# Patient Record
Sex: Male | Born: 1955
Health system: Southern US, Community
[De-identification: ages and names within clinical notes are randomized; demographics above are authoritative.]

## PROBLEM LIST (undated history)

## (undated) DIAGNOSIS — Z972 Presence of dental prosthetic device (complete) (partial): Secondary | ICD-10-CM

## (undated) DIAGNOSIS — Z973 Presence of spectacles and contact lenses: Secondary | ICD-10-CM

## (undated) DIAGNOSIS — B1920 Unspecified viral hepatitis C without hepatic coma: Secondary | ICD-10-CM

## (undated) DIAGNOSIS — H9193 Unspecified hearing loss, bilateral: Secondary | ICD-10-CM

## (undated) DIAGNOSIS — K219 Gastro-esophageal reflux disease without esophagitis: Secondary | ICD-10-CM

## (undated) DIAGNOSIS — N186 End stage renal disease: Secondary | ICD-10-CM

## (undated) DIAGNOSIS — G253 Myoclonus: Secondary | ICD-10-CM

## (undated) DIAGNOSIS — F32A Depression, unspecified: Secondary | ICD-10-CM

## (undated) DIAGNOSIS — M1990 Unspecified osteoarthritis, unspecified site: Secondary | ICD-10-CM

## (undated) DIAGNOSIS — F419 Anxiety disorder, unspecified: Secondary | ICD-10-CM

## (undated) DIAGNOSIS — J189 Pneumonia, unspecified organism: Secondary | ICD-10-CM

## (undated) DIAGNOSIS — Z862 Personal history of diseases of the blood and blood-forming organs and certain disorders involving the immune mechanism: Secondary | ICD-10-CM

## (undated) DIAGNOSIS — F329 Major depressive disorder, single episode, unspecified: Secondary | ICD-10-CM

## (undated) DIAGNOSIS — Z992 Dependence on renal dialysis: Secondary | ICD-10-CM

## (undated) DIAGNOSIS — D649 Anemia, unspecified: Secondary | ICD-10-CM

## (undated) DIAGNOSIS — I10 Essential (primary) hypertension: Secondary | ICD-10-CM

## (undated) DIAGNOSIS — N289 Disorder of kidney and ureter, unspecified: Secondary | ICD-10-CM

## (undated) DIAGNOSIS — I639 Cerebral infarction, unspecified: Secondary | ICD-10-CM

## (undated) DIAGNOSIS — R0600 Dyspnea, unspecified: Secondary | ICD-10-CM

## (undated) DIAGNOSIS — N189 Chronic kidney disease, unspecified: Secondary | ICD-10-CM

## (undated) DIAGNOSIS — N529 Male erectile dysfunction, unspecified: Secondary | ICD-10-CM

## (undated) HISTORY — PX: AV FISTULA PLACEMENT: SHX1204

## (undated) HISTORY — PX: COLON RESECTION: SHX5231

## (undated) HISTORY — PX: UMBILICAL HERNIA REPAIR: SHX196

## (undated) HISTORY — PX: TONSILLECTOMY: SUR1361

## (undated) HISTORY — PX: CATARACT EXTRACTION: SUR2

## (undated) HISTORY — PX: COLONOSCOPY W/ BIOPSIES AND POLYPECTOMY: SHX1376

## (undated) HISTORY — PX: HERNIA REPAIR: SHX51

---

## 2005-01-30 ENCOUNTER — Emergency Department (HOSPITAL_COMMUNITY): Admission: EM | Admit: 2005-01-30 | Discharge: 2005-01-30 | Payer: Self-pay | Admitting: Emergency Medicine

## 2005-05-25 ENCOUNTER — Emergency Department (HOSPITAL_COMMUNITY): Admission: EM | Admit: 2005-05-25 | Discharge: 2005-05-25 | Payer: Self-pay | Admitting: Emergency Medicine

## 2005-08-20 ENCOUNTER — Emergency Department (HOSPITAL_COMMUNITY): Admission: EM | Admit: 2005-08-20 | Discharge: 2005-08-20 | Payer: Self-pay | Admitting: Emergency Medicine

## 2005-10-14 ENCOUNTER — Emergency Department (HOSPITAL_COMMUNITY): Admission: EM | Admit: 2005-10-14 | Discharge: 2005-10-15 | Payer: Self-pay | Admitting: Emergency Medicine

## 2018-03-30 ENCOUNTER — Encounter (HOSPITAL_COMMUNITY): Payer: Self-pay | Admitting: Emergency Medicine

## 2018-03-30 ENCOUNTER — Observation Stay (HOSPITAL_COMMUNITY)
Admission: EM | Admit: 2018-03-30 | Discharge: 2018-04-02 | Disposition: A | Discharge status: Home / Self Care | Payer: Medicaid Other | Attending: Internal Medicine | Admitting: Internal Medicine

## 2018-03-30 DIAGNOSIS — E875 Hyperkalemia: Principal | ICD-10-CM | POA: Insufficient documentation

## 2018-03-30 DIAGNOSIS — I69349 Monoplegia of lower limb following cerebral infarction affecting unspecified side: Secondary | ICD-10-CM | POA: Insufficient documentation

## 2018-03-30 DIAGNOSIS — D631 Anemia in chronic kidney disease: Secondary | ICD-10-CM | POA: Diagnosis not present

## 2018-03-30 DIAGNOSIS — Z87891 Personal history of nicotine dependence: Secondary | ICD-10-CM | POA: Diagnosis not present

## 2018-03-30 DIAGNOSIS — Z79899 Other long term (current) drug therapy: Secondary | ICD-10-CM | POA: Insufficient documentation

## 2018-03-30 DIAGNOSIS — N2581 Secondary hyperparathyroidism of renal origin: Secondary | ICD-10-CM | POA: Diagnosis not present

## 2018-03-30 DIAGNOSIS — I10 Essential (primary) hypertension: Secondary | ICD-10-CM | POA: Diagnosis present

## 2018-03-30 DIAGNOSIS — Z992 Dependence on renal dialysis: Secondary | ICD-10-CM | POA: Diagnosis not present

## 2018-03-30 DIAGNOSIS — I120 Hypertensive chronic kidney disease with stage 5 chronic kidney disease or end stage renal disease: Secondary | ICD-10-CM | POA: Insufficient documentation

## 2018-03-30 DIAGNOSIS — N186 End stage renal disease: Secondary | ICD-10-CM

## 2018-03-30 HISTORY — DX: Cerebral infarction, unspecified: I63.9

## 2018-03-30 HISTORY — DX: Essential (primary) hypertension: I10

## 2018-03-30 HISTORY — DX: Disorder of kidney and ureter, unspecified: N28.9

## 2018-03-30 HISTORY — DX: Anxiety disorder, unspecified: F41.9

## 2018-03-30 LAB — CBC
HCT: 39.1 % (ref 39.0–52.0)
Hemoglobin: 12.4 g/dL — ABNORMAL LOW (ref 13.0–17.0)
MCH: 33 pg (ref 26.0–34.0)
MCHC: 31.7 g/dL (ref 30.0–36.0)
MCV: 104 fL — ABNORMAL HIGH (ref 78.0–100.0)
PLATELETS: 203 10*3/uL (ref 150–400)
RBC: 3.76 MIL/uL — AB (ref 4.22–5.81)
RDW: 14.9 % (ref 11.5–15.5)
WBC: 5.6 10*3/uL (ref 4.0–10.5)

## 2018-03-30 LAB — BASIC METABOLIC PANEL
ANION GAP: 21 — AB (ref 5–15)
BUN: 102 mg/dL — ABNORMAL HIGH (ref 6–20)
CALCIUM: 9.5 mg/dL (ref 8.9–10.3)
CO2: 21 mmol/L — ABNORMAL LOW (ref 22–32)
Chloride: 100 mmol/L — ABNORMAL LOW (ref 101–111)
Creatinine, Ser: 16.39 mg/dL — ABNORMAL HIGH (ref 0.61–1.24)
GFR, EST AFRICAN AMERICAN: 3 mL/min — AB (ref >60)
GFR, EST NON AFRICAN AMERICAN: 3 mL/min — AB (ref >60)
Glucose, Bld: 98 mg/dL (ref 65–99)
POTASSIUM: 6.2 mmol/L — AB (ref 3.5–5.1)
SODIUM: 142 mmol/L (ref 135–145)

## 2018-03-30 NOTE — ED Triage Notes (Signed)
Pt presents with complications from paperwork (or lack thereof) for setting up dialysis and prescriptions from being released from prison on Monday; pt received his last dialysis treatment on Monday before discharge; pt states the prison didn't do his paperwork right and he has been to several dialysis centers in the area which were not able to see him

## 2018-03-31 ENCOUNTER — Other Ambulatory Visit: Payer: Self-pay

## 2018-03-31 ENCOUNTER — Emergency Department (HOSPITAL_COMMUNITY): Payer: Medicaid Other

## 2018-03-31 ENCOUNTER — Encounter (HOSPITAL_COMMUNITY): Payer: Self-pay | Admitting: Internal Medicine

## 2018-03-31 DIAGNOSIS — Z992 Dependence on renal dialysis: Secondary | ICD-10-CM | POA: Diagnosis not present

## 2018-03-31 DIAGNOSIS — N186 End stage renal disease: Secondary | ICD-10-CM | POA: Diagnosis present

## 2018-03-31 DIAGNOSIS — I120 Hypertensive chronic kidney disease with stage 5 chronic kidney disease or end stage renal disease: Secondary | ICD-10-CM | POA: Diagnosis not present

## 2018-03-31 DIAGNOSIS — I10 Essential (primary) hypertension: Secondary | ICD-10-CM | POA: Diagnosis present

## 2018-03-31 DIAGNOSIS — E875 Hyperkalemia: Secondary | ICD-10-CM | POA: Diagnosis not present

## 2018-03-31 LAB — CBC
HCT: 34 % — ABNORMAL LOW (ref 39.0–52.0)
HEMOGLOBIN: 11 g/dL — AB (ref 13.0–17.0)
MCH: 34 pg (ref 26.0–34.0)
MCHC: 32.4 g/dL (ref 30.0–36.0)
MCV: 104.9 fL — ABNORMAL HIGH (ref 78.0–100.0)
PLATELETS: 163 10*3/uL (ref 150–400)
RBC: 3.24 MIL/uL — AB (ref 4.22–5.81)
RDW: 15 % (ref 11.5–15.5)
WBC: 5.4 10*3/uL (ref 4.0–10.5)

## 2018-03-31 LAB — HIV ANTIBODY (ROUTINE TESTING W REFLEX): HIV Screen 4th Generation wRfx: NONREACTIVE

## 2018-03-31 LAB — RENAL FUNCTION PANEL
ALBUMIN: 3.7 g/dL (ref 3.5–5.0)
ANION GAP: 20 — AB (ref 5–15)
BUN: 109 mg/dL — ABNORMAL HIGH (ref 6–20)
CALCIUM: 9.3 mg/dL (ref 8.9–10.3)
CO2: 19 mmol/L — ABNORMAL LOW (ref 22–32)
CREATININE: 17.43 mg/dL — AB (ref 0.61–1.24)
Chloride: 104 mmol/L (ref 101–111)
GFR calc non Af Amer: 3 mL/min — ABNORMAL LOW (ref >60)
GFR, EST AFRICAN AMERICAN: 3 mL/min — AB (ref >60)
Glucose, Bld: 89 mg/dL (ref 65–99)
Phosphorus: 8.4 mg/dL — ABNORMAL HIGH (ref 2.5–4.6)
Potassium: 5.7 mmol/L — ABNORMAL HIGH (ref 3.5–5.1)
SODIUM: 143 mmol/L (ref 135–145)

## 2018-03-31 MED ORDER — CALCIUM CARBONATE ANTACID 500 MG PO CHEW
2.0000 | CHEWABLE_TABLET | Freq: Three times a day (TID) | ORAL | Status: DC
  Administered 2018-03-31 – 2018-04-02 (×6): 400 mg via ORAL
  Filled 2018-03-31 (×7): qty 2

## 2018-03-31 MED ORDER — HYDRALAZINE HCL 25 MG PO TABS
25.0000 mg | ORAL_TABLET | Freq: Three times a day (TID) | ORAL | Status: DC
  Administered 2018-03-31 – 2018-04-01 (×4): 25 mg via ORAL
  Filled 2018-03-31 (×5): qty 1

## 2018-03-31 MED ORDER — PATIROMER SORBITEX CALCIUM 8.4 G PO PACK
16.8000 g | PACK | Freq: Every day | ORAL | Status: DC
  Administered 2018-03-31: 16.8 g via ORAL
  Filled 2018-03-31 (×3): qty 2

## 2018-03-31 MED ORDER — LOSARTAN POTASSIUM 50 MG PO TABS
50.0000 mg | ORAL_TABLET | Freq: Every day | ORAL | Status: DC
  Administered 2018-04-01: 50 mg via ORAL
  Filled 2018-03-31 (×2): qty 1

## 2018-03-31 MED ORDER — ONDANSETRON HCL 4 MG/2ML IJ SOLN: 4.0000 mg | Freq: Four times a day (QID) | INTRAMUSCULAR | Status: DC | PRN

## 2018-03-31 MED ORDER — IBUPROFEN 400 MG PO TABS: 400.0000 mg | ORAL_TABLET | Freq: Four times a day (QID) | ORAL | Status: DC | PRN

## 2018-03-31 MED ORDER — B COMPLEX-C PO TABS
1.0000 | ORAL_TABLET | Freq: Every day | ORAL | Status: DC
  Administered 2018-04-01: 1 via ORAL
  Filled 2018-03-31 (×2): qty 1

## 2018-03-31 MED ORDER — ACETAMINOPHEN 325 MG PO TABS: 650.0000 mg | ORAL_TABLET | Freq: Four times a day (QID) | ORAL | Status: DC | PRN

## 2018-03-31 MED ORDER — AMLODIPINE BESYLATE 10 MG PO TABS
10.0000 mg | ORAL_TABLET | Freq: Every day | ORAL | Status: DC
  Administered 2018-03-31 – 2018-04-01 (×2): 10 mg via ORAL
  Filled 2018-03-31 (×2): qty 1

## 2018-03-31 MED ORDER — FAMOTIDINE 20 MG PO TABS
40.0000 mg | ORAL_TABLET | Freq: Every day | ORAL | Status: DC
  Administered 2018-03-31: 40 mg via ORAL
  Filled 2018-03-31: qty 2

## 2018-03-31 MED ORDER — ACETAMINOPHEN 650 MG RE SUPP: 650.0000 mg | Freq: Four times a day (QID) | RECTAL | Status: DC | PRN

## 2018-03-31 MED ORDER — HEPARIN SODIUM (PORCINE) 5000 UNIT/ML IJ SOLN
5000.0000 [IU] | Freq: Three times a day (TID) | INTRAMUSCULAR | Status: DC
  Administered 2018-03-31 – 2018-04-02 (×6): 5000 [IU] via SUBCUTANEOUS
  Filled 2018-03-31 (×5): qty 1

## 2018-03-31 MED ORDER — METOPROLOL TARTRATE 50 MG PO TABS
150.0000 mg | ORAL_TABLET | Freq: Two times a day (BID) | ORAL | Status: DC
  Administered 2018-04-01 – 2018-04-02 (×3): 150 mg via ORAL
  Filled 2018-03-31 (×4): qty 1

## 2018-03-31 MED ORDER — CLONIDINE HCL 0.3 MG PO TABS
0.3000 mg | ORAL_TABLET | Freq: Three times a day (TID) | ORAL | Status: DC
  Administered 2018-03-31 – 2018-04-01 (×4): 0.3 mg via ORAL
  Filled 2018-03-31 (×5): qty 1

## 2018-03-31 MED ORDER — SODIUM CHLORIDE 0.9 % IV SOLN: 100.0000 mL | INTRAVENOUS | Status: DC | PRN

## 2018-03-31 MED ORDER — CHLORHEXIDINE GLUCONATE CLOTH 2 % EX PADS: 6.0000 | MEDICATED_PAD | Freq: Every day | CUTANEOUS | Status: DC

## 2018-03-31 MED ORDER — SEVELAMER CARBONATE 800 MG PO TABS
3200.0000 mg | ORAL_TABLET | Freq: Three times a day (TID) | ORAL | Status: DC
  Administered 2018-03-31 – 2018-04-02 (×5): 3200 mg via ORAL
  Filled 2018-03-31 (×10): qty 4

## 2018-03-31 MED ORDER — ONDANSETRON HCL 4 MG PO TABS: 4.0000 mg | ORAL_TABLET | Freq: Four times a day (QID) | ORAL | Status: DC | PRN

## 2018-03-31 MED ORDER — DIPHENHYDRAMINE HCL 25 MG PO CAPS: 50.0000 mg | ORAL_CAPSULE | Freq: Two times a day (BID) | ORAL | Status: DC | PRN

## 2018-03-31 MED ORDER — CLONIDINE HCL 0.2 MG PO TABS
0.3000 mg | ORAL_TABLET | Freq: Once | ORAL | Status: AC
  Administered 2018-03-31: 0.3 mg via ORAL
  Filled 2018-03-31: qty 1

## 2018-03-31 MED ORDER — CALCIUM POLYCARBOPHIL 625 MG PO TABS
1250.0000 mg | ORAL_TABLET | Freq: Two times a day (BID) | ORAL | Status: DC
  Administered 2018-03-31 – 2018-04-02 (×4): 1250 mg via ORAL
  Filled 2018-03-31 (×5): qty 2

## 2018-03-31 NOTE — ED Provider Notes (Signed)
[facility] EMERGENCY DEPARTMENT Provider Note   CSN: [id number] Arrival date & time: 03/30/18  2040     History   Chief Complaint Chief Complaint  Patient presents with  . Vascular Access Problem    HPI Maurice Bell is a 62 y.o. male.  Patient states he needs dialysis.  He has been on dialysis for the past 14 years and does not make urine.  He was released from prison 2 days ago after being incarcerated for many years.  His last dialysis session was on May 27.  He denies any chest pain or shortness of breath.  He denies any difficulty breathing or difficulty swallowing.  No focal weakness, numbness or tingling.  No headache or visual changes.  Patient states his paperwork was inadequate for him to have outpatient dialysis. He is also out of his clonidine but has his other medications.  The history is provided by the patient.    Past Medical History:  Diagnosis Date  . Hypertension   . Renal disorder    ESRD  . Stroke [facility])    bilat leg weakness residual    There are no active problems to display for this patient.   Past Surgical History:  Procedure Laterality Date  . AV FISTULA PLACEMENT Left         Home Medications    Prior to Admission medications   Not on File    Family History No family history on file.  Social History Social History   Tobacco Use  . Smoking status: Former Smoker  Substance Use Topics  . Alcohol use: Not Currently  . Drug use: Not Currently     Allergies   Patient has no known allergies.   Review of Systems Review of Systems  Constitutional: Negative for activity change, appetite change and fever.  HENT: Negative for congestion.   Eyes: Negative for visual disturbance.  Respiratory: Negative for cough, chest tightness and shortness of breath.   Cardiovascular: Negative for chest pain.  Gastrointestinal: Negative for abdominal pain, nausea and vomiting.  Genitourinary: Negative for dysuria,  hematuria and urgency.  Musculoskeletal: Negative for arthralgias.  Skin: Negative for rash.  Neurological: Negative for dizziness, tremors and headaches.    all other systems are negative except as noted in the HPI and PMH.    Physical Exam Updated Vital Signs BP (!) 192/99 (BP Location: Right Arm)   Pulse 94   Temp 98.7 F (37.1 C) (Oral)   Resp 16   Ht 5\' 5"  (1.651 m)   Wt 68 kg (150 lb)   SpO2 97%   BMI 24.96 kg/m   Physical Exam  Constitutional: He is oriented to person, place, and time. He appears well-developed and well-nourished. No distress.  HENT:  Head: Normocephalic and atraumatic.  Mouth/Throat: Oropharynx is clear and moist. No oropharyngeal exudate.  Eyes: Pupils are equal, round, and reactive to light. Conjunctivae and EOM are normal.  Neck: Normal range of motion. Neck supple.  No meningismus.  Cardiovascular: Normal rate, regular rhythm, normal heart sounds and intact distal pulses.  No murmur heard. Pulmonary/Chest: Effort normal and breath sounds normal. No respiratory distress.  Abdominal: Soft. There is no tenderness. There is no rebound and no guarding.  Musculoskeletal: Normal range of motion. He exhibits no edema or tenderness.  Fistula L forearm with thrill  Neurological: He is alert and oriented to person, place, and time. No cranial nerve deficit. He exhibits normal muscle tone. Coordination normal.  No ataxia on  finger to nose bilaterally. No pronator drift. 5/5 strength throughout. CN 2-12 intact.Equal grip strength. Sensation intact.   Skin: Skin is warm.  Psychiatric: He has a normal mood and affect. His behavior is normal.  Nursing note and vitals reviewed.    ED Treatments / Results  Labs (all labs ordered are listed, but only abnormal results are displayed) Labs Reviewed  CBC - Abnormal; Notable for the following components:      Result Value   RBC 3.76 (*)    Hemoglobin 12.4 (*)    MCV 104.0 (*)    All other components within  normal limits  BASIC METABOLIC PANEL - Abnormal; Notable for the following components:   Potassium 6.2 (*)    Chloride 100 (*)    CO2 21 (*)    BUN 102 (*)    Creatinine, Ser 16.39 (*)    GFR calc non Af Amer 3 (*)    GFR calc Af Amer 3 (*)    Anion gap 21 (*)    All other components within normal limits    EKG EKG Interpretation  Date/Time:  Wednesday Mar 30 2018 22:16:14 EDT Ventricular Rate:  83 PR Interval:  184 QRS Duration: 88 QT Interval:  388 QTC Calculation: 455 R Axis:   -64 Text Interpretation:  Normal sinus rhythm Left axis deviation Nonspecific T wave abnormality Abnormal ECG no peaked T waves  Confirmed by Ezequiel Essex [phone]) on 03/31/2018 12:05:55 AM   Radiology Dg Chest 2 View  Result Date: 03/31/2018 CLINICAL DATA:  62 y/o  M; shortness of breath. EXAM: CHEST - 2 VIEW COMPARISON:  10/15/2005 chest radiograph FINDINGS: Stable heart size and mediastinal contours are within normal limits. Nipple shadows over the lower lung zones. No focal consolidation. No pleural effusion or pneumothorax. The visualized skeletal structures are unremarkable. IMPRESSION: No acute pulmonary process identified. Electronically Signed   By: Kristine Garbe M.D.   On: 03/31/2018 01:37    Procedures Procedures (including critical care time)  Medications Ordered in ED Medications  cloNIDine (CATAPRES) tablet 0.3 mg (has no administration in time range)     Initial Impression / Assessment and Plan / ED Course  I have reviewed the triage vital signs and the nursing notes.  Pertinent labs & imaging results that were available during my care of the patient were reviewed by me and considered in my medical decision making (see chart for details).    Dialysis patient with potassium 6.2.  EKG without peaked T waves.  Patient released from prison 2 days ago and does not have outpatient dialysis established.  D/w Dr. Joelyn Oms of nephrology.  Without outpatient center, will  admit overnight for dialysis in the AM Recommends veltassa and admission to hospitalist.   D/w Dr. Alcario Drought. Clonidine given for likely rebound HTN.   CRITICAL CARE Performed by: Ezequiel Essex Total critical care time: 32 minutes Critical care time was exclusive of separately billable procedures and treating other patients. Critical care was necessary to treat or prevent imminent or life-threatening deterioration. Critical care was time spent personally by me on the following activities: development of treatment plan with patient and/or surrogate as well as nursing, discussions with consultants, evaluation of patient's response to treatment, examination of patient, obtaining history from patient or surrogate, ordering and performing treatments and interventions, ordering and review of laboratory studies, ordering and review of radiographic studies, pulse oximetry and re-evaluation of patient's condition.  Final Clinical Impressions(s) / ED Diagnoses   Final diagnoses:  Hyperkalemia  ESRD (end stage renal disease) [facility])    ED Discharge Orders    None       Alyss Granato, Annie Main, MD 03/31/18 [phone]

## 2018-03-31 NOTE — Procedures (Signed)
Patient was seen on dialysis and the procedure was supervised.  BFR 400  Via AVF BP is  143/86.   Patient appears to be tolerating treatment well.  No OP HD arrangements made when got discharged from prison.  Have initiated CLIP- HD today, will probably need to stay inpatient until OP arrangements solidified   Matthieu Loftus A 03/31/2018

## 2018-03-31 NOTE — ED Notes (Signed)
Attempted to gain IV access x2, without success.  

## 2018-03-31 NOTE — ED Notes (Signed)
Patient transported to X-ray 

## 2018-03-31 NOTE — Progress Notes (Signed)
Admission note:  Arrival Method: Patient arrived in w/c from ED. Mental Orientation:  A & O  x4. Telemetry: Tele Box 18, NSR. Assessment: See doc flow sheets. Skin: No open wounds or any rash noted. Warm, dry and intact. IV: Right hand IV SL. Pain: Denies any pain. Tubes: None. Safety Measures: Bed in low position, call bell and phone within reach. Fall Prevention Safety Plan: Reviewed the plan, understood and acknowledged. Admission Screening: In progress. 6700 Orientation: Patient has been oriented to the unit, staff and to the room.

## 2018-03-31 NOTE — ED Notes (Signed)
IV Team at bedside 

## 2018-03-31 NOTE — ED Notes (Signed)
Diet tray ordered 

## 2018-03-31 NOTE — Progress Notes (Signed)
Patient released from [facility] [place] 03/28/18 without oupatient HD treatment arranged.  Spoke with Dr. Lorrene Reid, she requested to initiate the CLIP process to facilitate placement.  Last outpt facility was [facility] approx 12 years ago (prior to re-incarceration.).  He states there were no problems with trmt at that facility.  S Richardson notified to initiated CLIP process.

## 2018-03-31 NOTE — Consult Note (Signed)
Maurice Bell Admit Date: 03/30/2018 03/31/2018 Maurice Bell Requesting Physician:  Rancour MD  Reason for Consult:  ESRD, Hyperkalemia, HTN HPI:  64M who was released from prison on Monday.  He has ESRD and it appears that no concrete arrangements were made for him to start treatment.  He states he reached out to local kidney centers and dialysis providers without any success and was told to present to the emergency room.  He has no chest pain, headache, shortness of breath, nausea, vomiting.  In the emergency room he has been hypertensive.  He states that he received all of his antihypertensive medications upon being released with the exception of 0.3 mg p.o. 3 times daily of clonidine which he has not received.  Labs in the emergency room notable for potassium of 6.2, BUN 102, hemoglobin 12.4.  Chest x-ray with no acute process.  EKG with normal PR interval, normal QRS interval, no peak T waves.  Given Patiromer in ED  Has hx/o CVA, no hx/o DM2.   Balance of 12 systems is negative w/ exceptions as above  PMH  Past Medical History:  Diagnosis Date  . Hypertension   . Renal disorder    ESRD  . Stroke [facility])    bilat leg weakness residual   FH No family history on file. SH  reports that he has quit smoking. He does not have any smokeless tobacco history on file. He reports that he drank alcohol. He reports that he has current or past drug history. Allergies No Known Allergies Home medications Prior to Admission medications   Medication Sig Start Date End Date Taking? Authorizing Provider  amLODipine (NORVASC) 10 MG tablet Take 10 mg by mouth daily.   Yes [provider]  B Complex-C (B-COMPLEX WITH VITAMIN C) tablet Take 1 tablet by mouth daily.   Yes [provider]  calcium carbonate (TUMS - DOSED IN MG ELEMENTAL CALCIUM) 500 MG chewable tablet Chew 2 tablets by mouth 3 (three) times daily with meals.   Yes [provider]  cloNIDine (CATAPRES)  0.3 MG tablet Take 0.3 mg by mouth 3 (three) times daily.   Yes [provider]  diphenhydrAMINE (BENADRYL) 50 MG capsule Take 50 mg by mouth every 12 (twelve) hours as needed for itching.   Yes [provider]  hydrALAZINE (APRESOLINE) 25 MG tablet Take 25 mg by mouth 3 (three) times daily.   Yes [provider]  ibuprofen (ADVIL,MOTRIN) 200 MG tablet Take 400 mg by mouth every 6 (six) hours as needed for mild pain.   Yes [provider]  losartan (COZAAR) 50 MG tablet Take 50 mg by mouth at bedtime.   Yes [provider]  metoprolol tartrate (LOPRESSOR) 50 MG tablet Take 150 mg by mouth 2 (two) times daily.   Yes [provider]  polycarbophil (FIBERCON) 625 MG tablet Take 1,250 mg by mouth 2 (two) times daily.   Yes [provider]  ranitidine (ZANTAC) 300 MG tablet Take 300 mg by mouth at bedtime.   Yes [provider]  sevelamer carbonate (RENVELA) 800 MG tablet Take 3,200 mg by mouth 3 (three) times daily with meals.   Yes [provider]    Current Medications Scheduled Meds: . patiromer  16.8 g Oral Daily   Continuous Infusions: PRN Meds:.  CBC Recent Labs  Lab 03/30/18 2222  WBC 5.6  HGB 12.4*  HCT 39.1  MCV 104.0*  PLT 330   Basic Metabolic Panel Recent Labs  Lab 03/30/18 2222  NA 142  K 6.2*  CL 100*  CO2 21*  GLUCOSE 98  BUN 102*  CREATININE 16.39*  CALCIUM 9.5    Physical Exam  Blood pressure (!) 182/108, pulse 93, temperature 98.7 F (37.1 C), temperature source Oral, resp. rate 15, height 5\' 5"  (1.651 m), weight 68 kg (150 lb), SpO2 100 %. GEN: NAD ENT: NCAT EYES: EOMI CV: RRR, nl s1s2 PULM: CTAB nl wob ABD: s/nt/nd SKIN: no rashes/lesiosn EXT:No LEE, LFA AVF +B/T   Assessment 69M ESRD LUE AVF without HD unit after release from prison.  1. ESRD 2. Hyperkalemia, mild 3. HTN, acute and severe, likely rebound from not rec clonidine 4. Hx/o CVA  Plan 1. HD in AM:  2K, 2-3L UF, AVF, No heparin, 4h 2. Clip pt 3. Resume all outpt BP meds, follow   Pearson Grippe MD [phone] pgr 03/31/2018, 2:24 AM

## 2018-03-31 NOTE — ED Notes (Signed)
Bed Control notified of pt's transfer to Hemodialysis and standing bed order.

## 2018-03-31 NOTE — Progress Notes (Signed)
Patient is admitted early this am, he is seen after returned from dialysis. He does not have any new complaints, will need case manager and social worker input to set up care /HD and meds needs. Hopefully able to d/c home tomorrow if outpatient services are able to set up.

## 2018-03-31 NOTE — H&P (Signed)
History and Physical    Maurice Bell WPY:[id number] DOB: 19-Jan-1956 DOA: 03/30/2018  PCP: Patient, No Pcp Per  Patient coming from: Home  I have personally briefly reviewed patient's old medical records in [place]  Chief Complaint: Needs dialysis  HPI: Maurice Bell is a 62 y.o. male with medical history significant of ESRD for past 14 years.  Patient was just released from prison on Monday after being incarcerated for an extended period of time.  It appears that no concrete arrangements were made for him to start treatment, reached out to local kidney centers without success and was told to go to ED.  Missed dialysis Wed.  ED Course: In the ED he has been hypertensive.  Got all HTN meds on release except clonidine 0.3mg  TID that he was taking.   Review of Systems: As per HPI otherwise 10 point review of systems negative.   Past Medical History:  Diagnosis Date  . Hypertension   . Renal disorder    ESRD  . Stroke [facility])    bilat leg weakness residual    Past Surgical History:  Procedure Laterality Date  . AV FISTULA PLACEMENT Left      reports that he has quit smoking. He does not have any smokeless tobacco history on file. He reports that he drank alcohol. He reports that he has current or past drug history.  No Known Allergies  History reviewed. No pertinent family history.   Prior to Admission medications   Medication Sig Start Date End Date Taking? Authorizing Provider  amLODipine (NORVASC) 10 MG tablet Take 10 mg by mouth daily.   Yes [provider]  B Complex-C (B-COMPLEX WITH VITAMIN C) tablet Take 1 tablet by mouth daily.   Yes [provider]  calcium carbonate (TUMS - DOSED IN MG ELEMENTAL CALCIUM) 500 MG chewable tablet Chew 2 tablets by mouth 3 (three) times daily with meals.   Yes [provider]  cloNIDine (CATAPRES) 0.3 MG tablet Take 0.3 mg by mouth 3 (three) times daily.   Yes [provider]    diphenhydrAMINE (BENADRYL) 50 MG capsule Take 50 mg by mouth every 12 (twelve) hours as needed for itching.   Yes [provider]  hydrALAZINE (APRESOLINE) 25 MG tablet Take 25 mg by mouth 3 (three) times daily.   Yes [provider]  ibuprofen (ADVIL,MOTRIN) 200 MG tablet Take 400 mg by mouth every 6 (six) hours as needed for mild pain.   Yes [provider]  losartan (COZAAR) 50 MG tablet Take 50 mg by mouth at bedtime.   Yes [provider]  metoprolol tartrate (LOPRESSOR) 50 MG tablet Take 150 mg by mouth 2 (two) times daily.   Yes [provider]  polycarbophil (FIBERCON) 625 MG tablet Take 1,250 mg by mouth 2 (two) times daily.   Yes [provider]  ranitidine (ZANTAC) 300 MG tablet Take 300 mg by mouth at bedtime.   Yes [provider]  sevelamer carbonate (RENVELA) 800 MG tablet Take 3,200 mg by mouth 3 (three) times daily with meals.   Yes [provider]    Physical Exam: Vitals:   03/31/18 0230 03/31/18 0300 03/31/18 0330 03/31/18 0400  BP: (!) 180/102 (!) 177/101 (!) 159/107   Pulse: 95 93  89  Resp: 14 20 (!) 23 16  Temp:      TempSrc:      SpO2: 99% 100%  97%  Weight:      Height:  Constitutional: NAD, calm, comfortable Eyes: PERRL, lids and conjunctivae normal ENMT: Mucous membranes are moist. Posterior pharynx clear of any exudate or lesions.Normal dentition.  Neck: normal, supple, no masses, no thyromegaly Respiratory: clear to auscultation bilaterally, no wheezing, no crackles. Normal respiratory effort. No accessory muscle use.  Cardiovascular: Regular rate and rhythm, no murmurs / rubs / gallops. No extremity edema. 2+ pedal pulses. No carotid bruits.  Abdomen: no tenderness, no masses palpated. No hepatosplenomegaly. Bowel sounds positive.  Musculoskeletal: no clubbing / cyanosis. No joint deformity upper and lower extremities. Good ROM, no contractures. Normal muscle tone.  Skin: no  rashes, lesions, ulcers. No induration Neurologic: CN 2-12 grossly intact. Sensation intact, DTR normal. Strength 5/5 in all 4.  Psychiatric: Normal judgment and insight. Alert and oriented x 3. Normal mood.    Labs on Admission: I have personally reviewed following labs and imaging studies  CBC: Recent Labs  Lab 03/30/18 2222  WBC 5.6  HGB 12.4*  HCT 39.1  MCV 104.0*  PLT 656   Basic Metabolic Panel: Recent Labs  Lab 03/30/18 2222  NA 142  K 6.2*  CL 100*  CO2 21*  GLUCOSE 98  BUN 102*  CREATININE 16.39*  CALCIUM 9.5   GFR: Estimated Creatinine Clearance: 4.2 mL/min (A) (by C-G formula based on SCr of 16.39 mg/dL (H)). Liver Function Tests: No results for input(s): AST, ALT, ALKPHOS, BILITOT, PROT, ALBUMIN in the last 168 hours. No results for input(s): LIPASE, AMYLASE in the last 168 hours. No results for input(s): AMMONIA in the last 168 hours. Coagulation Profile: No results for input(s): INR, PROTIME in the last 168 hours. Cardiac Enzymes: No results for input(s): CKTOTAL, CKMB, CKMBINDEX, TROPONINI in the last 168 hours. BNP (last 3 results) No results for input(s): PROBNP in the last 8760 hours. HbA1C: No results for input(s): HGBA1C in the last 72 hours. CBG: No results for input(s): GLUCAP in the last 168 hours. Lipid Profile: No results for input(s): CHOL, HDL, LDLCALC, TRIG, CHOLHDL, LDLDIRECT in the last 72 hours. Thyroid Function Tests: No results for input(s): TSH, T4TOTAL, FREET4, T3FREE, THYROIDAB in the last 72 hours. Anemia Panel: No results for input(s): VITAMINB12, FOLATE, FERRITIN, TIBC, IRON, RETICCTPCT in the last 72 hours. Urine analysis: No results found for: COLORURINE, APPEARANCEUR, LABSPEC, PHURINE, GLUCOSEU, HGBUR, BILIRUBINUR, KETONESUR, PROTEINUR, UROBILINOGEN, NITRITE, LEUKOCYTESUR  Radiological Exams on Admission: Dg Chest 2 View  Result Date: 03/31/2018 CLINICAL DATA:  62 y/o  M; shortness of breath. EXAM: CHEST - 2 VIEW  COMPARISON:  10/15/2005 chest radiograph FINDINGS: Stable heart size and mediastinal contours are within normal limits. Nipple shadows over the lower lung zones. No focal consolidation. No pleural effusion or pneumothorax. The visualized skeletal structures are unremarkable. IMPRESSION: No acute pulmonary process identified. Electronically Signed   By: Kristine Garbe M.D.   On: 03/31/2018 01:37    EKG: Independently reviewed.  Assessment/Plan Principal Problem:   Need for acute hemodialysis ([place]) Active Problems:   ESRD (end stage renal disease) ([facility])   HTN (hypertension)    1. ESRD, needs dialysis - 1. Place in obs 2. Dialysis in AM 3. Dr. Joelyn Oms has seen patient 2. Hyperkalemia - 1. Tele monitor 2. Veltassa per Dr. Adin Hector instructions 3. HTN - 1. Continue home BP meds 2. Got clonadine in ED for severe HTN, probably rebound since he didn't get this on release from prison 3. CM to make sure he can get this as outpt  DVT prophylaxis: SCDs Code Status: Full Family Communication: No  family in room Disposition Plan: Home after admit Consults called: None Admission status: Place in obs   Bertha Lokken, [facility] Pager [phone]  If 7AM-7PM, please contact day team taking care of patient [email or web address] Password [email or web address]  03/31/2018, 5:02 AM

## 2018-03-31 NOTE — ED Notes (Signed)
Patient returned from Dialysis , no complaints at present.

## 2018-04-01 DIAGNOSIS — I120 Hypertensive chronic kidney disease with stage 5 chronic kidney disease or end stage renal disease: Secondary | ICD-10-CM | POA: Diagnosis not present

## 2018-04-01 DIAGNOSIS — N186 End stage renal disease: Secondary | ICD-10-CM | POA: Diagnosis not present

## 2018-04-01 DIAGNOSIS — Z992 Dependence on renal dialysis: Secondary | ICD-10-CM | POA: Diagnosis not present

## 2018-04-01 DIAGNOSIS — E875 Hyperkalemia: Secondary | ICD-10-CM | POA: Diagnosis not present

## 2018-04-01 LAB — BASIC METABOLIC PANEL
ANION GAP: 16 — AB (ref 5–15)
BUN: 40 mg/dL — ABNORMAL HIGH (ref 6–20)
CHLORIDE: 95 mmol/L — AB (ref 101–111)
CO2: 25 mmol/L (ref 22–32)
Calcium: 8.9 mg/dL (ref 8.9–10.3)
Creatinine, Ser: 10.24 mg/dL — ABNORMAL HIGH (ref 0.61–1.24)
GFR calc non Af Amer: 5 mL/min — ABNORMAL LOW (ref >60)
GFR, EST AFRICAN AMERICAN: 6 mL/min — AB (ref >60)
Glucose, Bld: 95 mg/dL (ref 65–99)
Potassium: 3.9 mmol/L (ref 3.5–5.1)
Sodium: 136 mmol/L (ref 135–145)

## 2018-04-01 LAB — HEPATITIS B SURFACE ANTIGEN: Hepatitis B Surface Ag: NEGATIVE

## 2018-04-01 MED ORDER — HYDRALAZINE HCL 25 MG PO TABS: 25.0000 mg | ORAL_TABLET | Freq: Three times a day (TID) | ORAL | 1 refills | Status: DC

## 2018-04-01 MED ORDER — LOSARTAN POTASSIUM 50 MG PO TABS: 50.0000 mg | ORAL_TABLET | Freq: Every day | ORAL | 1 refills | Status: DC

## 2018-04-01 MED ORDER — CALCIUM CARBONATE ANTACID 500 MG PO CHEW: 2.0000 | CHEWABLE_TABLET | Freq: Three times a day (TID) | ORAL | 0 refills | Status: DC

## 2018-04-01 MED ORDER — AMLODIPINE BESYLATE 10 MG PO TABS: 10.0000 mg | ORAL_TABLET | Freq: Every day | ORAL | 1 refills | Status: DC

## 2018-04-01 MED ORDER — DIPHENHYDRAMINE HCL 50 MG PO CAPS: 50.0000 mg | ORAL_CAPSULE | Freq: Two times a day (BID) | ORAL | 0 refills | Status: DC | PRN

## 2018-04-01 MED ORDER — CLONIDINE HCL 0.3 MG PO TABS: 0.3000 mg | ORAL_TABLET | Freq: Three times a day (TID) | ORAL | 1 refills | Status: DC

## 2018-04-01 MED ORDER — FAMOTIDINE 20 MG PO TABS
20.0000 mg | ORAL_TABLET | Freq: Every day | ORAL | Status: DC
  Administered 2018-04-01: 20 mg via ORAL
  Filled 2018-04-01: qty 1

## 2018-04-01 MED ORDER — SEVELAMER CARBONATE 800 MG PO TABS: 3200.0000 mg | ORAL_TABLET | Freq: Three times a day (TID) | ORAL | 1 refills | Status: DC

## 2018-04-01 MED ORDER — CALCIUM POLYCARBOPHIL 625 MG PO TABS: 1250.0000 mg | ORAL_TABLET | Freq: Two times a day (BID) | ORAL | 0 refills | Status: DC

## 2018-04-01 MED ORDER — RANITIDINE HCL 300 MG PO TABS: 300.0000 mg | ORAL_TABLET | Freq: Every day | ORAL | 0 refills | Status: DC

## 2018-04-01 MED ORDER — METOPROLOL TARTRATE 50 MG PO TABS: 150.0000 mg | ORAL_TABLET | Freq: Two times a day (BID) | ORAL | 1 refills | Status: DC

## 2018-04-01 MED ORDER — CHLORHEXIDINE GLUCONATE CLOTH 2 % EX PADS: 6.0000 | MEDICATED_PAD | Freq: Every day | CUTANEOUS | Status: DC

## 2018-04-01 MED ORDER — B COMPLEX-C PO TABS: 1.0000 | ORAL_TABLET | Freq: Every day | ORAL | 0 refills | Status: DC

## 2018-04-01 MED FILL — METOPROLOL TARTRATE 50 MG T: 50 | 10 days supply | Qty: 60 | Fill #0

## 2018-04-01 MED FILL — LOSARTAN POTASSIUM 50 MG TA: 50 | 30 days supply | Qty: 30 | Fill #0

## 2018-04-01 MED FILL — AMLODIPINE BESYLATE 10 MG T: 10 | 30 days supply | Qty: 30 | Fill #0

## 2018-04-01 MED FILL — cloNIDine HCL 0.3 MG TABS: 0.3 | 30 days supply | Qty: 90 | Fill #0

## 2018-04-01 MED FILL — hydrALAZINE HCL 25 MG TABS: 25 | 30 days supply | Qty: 90 | Fill #0

## 2018-04-01 MED FILL — raNITIdine HCL 300 MG TABS: 300 | 30 days supply | Qty: 30 | Fill #0

## 2018-04-01 MED FILL — RENVELA 800 MG TABLET: 800 | 22 days supply | Qty: 270 | Fill #0

## 2018-04-01 NOTE — Progress Notes (Signed)
Subjective:  Doing well- no issues with HD yesterday - removed almost 2000- post weight 64.6.  Anxious to go home- working out financials to OP HD but pretty sure he will go to [place]  Objective Vital signs in last 24 hours: Vitals:   03/31/18 1528 03/31/18 2101 04/01/18 0503 04/01/18 0830  BP: (!) 141/117 99/68 (!) 149/81 (!) 143/82  Pulse: 93 81 87 81  Resp: (!) 22 16 16 16   Temp: 98.2 F (36.8 C) 98.8 F (37.1 C) 98.8 F (37.1 C) 98.1 F (36.7 C)  TempSrc: Oral Oral Oral Oral  SpO2: 100% 100% 100% 100%  Weight:  64.7 kg (142 lb 10.2 oz)    Height:       Weight change: -1.84 kg (-4 lb 0.9 oz)  Intake/Output Summary (Last 24 hours) at 04/01/2018 1413 Last data filed at 04/01/2018 1030 Gross per 24 hour  Intake 660 ml  Output 0 ml  Net 660 ml    Assessment/ Plan: Pt is a 62 y.o. yo male with ESRD who was admitted on 03/30/2018 with no arrangements made for OP HD when released from prison  Assessment/Plan: 1. ESRD-  HD yest via AVF after no HD for several days.  Will likely be accepted to [place] but waiting on some financial info.  I think safe to plan to have him get HD here in the AM then discharge.  Our staff will be on top on it to let him know where to be and when next week at which time I anticipate placement will be final 2. Anemia- not bad, hgb over 11- no ESA here- will cont to follow as OP 4. Secondary hyperparathyroidism- phos 8.4- on tums as binder- will check PTH and handle at OP unit 5. HTN/volume- EDW seems to be around 64.5-  6. Hyperkalemia- resolved with hd   Maurice Bell A    Labs: Basic Metabolic Panel: Recent Labs  Lab 03/30/18 2222 03/31/18 0802 04/01/18 0622  NA 142 143 136  K 6.2* 5.7* 3.9  CL 100* 104 95*  CO2 21* 19* 25  GLUCOSE 98 89 95  BUN 102* 109* 40*  CREATININE 16.39* 17.43* 10.24*  CALCIUM 9.5 9.3 8.9  PHOS  --  8.4*  --    Liver Function Tests: Recent Labs  Lab 03/31/18 0802  ALBUMIN 3.7   No results for input(s):  LIPASE, AMYLASE in the last 168 hours. No results for input(s): AMMONIA in the last 168 hours. CBC: Recent Labs  Lab 03/30/18 2222 03/31/18 0802  WBC 5.6 5.4  HGB 12.4* 11.0*  HCT 39.1 34.0*  MCV 104.0* 104.9*  PLT 203 163   Cardiac Enzymes: No results for input(s): CKTOTAL, CKMB, CKMBINDEX, TROPONINI in the last 168 hours. CBG: No results for input(s): GLUCAP in the last 168 hours.  Iron Studies: No results for input(s): IRON, TIBC, TRANSFERRIN, FERRITIN in the last 72 hours. Studies/Results: Dg Chest 2 View  Result Date: 03/31/2018 CLINICAL DATA:  62 y/o  M; shortness of breath. EXAM: CHEST - 2 VIEW COMPARISON:  10/15/2005 chest radiograph FINDINGS: Stable heart size and mediastinal contours are within normal limits. Nipple shadows over the lower lung zones. No focal consolidation. No pleural effusion or pneumothorax. The visualized skeletal structures are unremarkable. IMPRESSION: No acute pulmonary process identified. Electronically Signed   By: Kristine Garbe M.D.   On: 03/31/2018 01:37   Medications: Infusions:   Scheduled Medications: . amLODipine  10 mg Oral Daily  . B-complex with vitamin C  1  tablet Oral Daily  . calcium carbonate  2 tablet Oral TID WC  . Chlorhexidine Gluconate Cloth  6 each Topical Q0600  . cloNIDine  0.3 mg Oral TID  . famotidine  40 mg Oral QHS  . heparin  5,000 Units Subcutaneous Q8H  . hydrALAZINE  25 mg Oral TID  . losartan  50 mg Oral QHS  . metoprolol tartrate  150 mg Oral BID  . patiromer  16.8 g Oral Daily  . polycarbophil  1,250 mg Oral BID  . sevelamer carbonate  3,200 mg Oral TID WC    have reviewed scheduled and prn medications.  Physical Exam: General: NAD Heart: RRR Lungs: clear Abdomen: soft, non tender Extremities: no edema Dialysis Access: left AVF    04/01/2018,2:13 PM  LOS: 0 days

## 2018-04-01 NOTE — Progress Notes (Signed)
PROGRESS NOTE  Maurice Bell VOZ:[id number] DOB: 02/26/1956 DOA: 03/30/2018 PCP: Patient, No Pcp Per  HPI/Recap of past 24 hours:  No new complaints,  Family at bedside  Assessment/Plan: Principal Problem:   Need for acute hemodialysis ([place]) Active Problems:   ESRD (end stage renal disease) ([place])   HTN (hypertension)  Hyperkalemia -Due to missing HD -K6.2 on presentation, k normalized today at 3.9 -Tele unremarkable, d/c tele  HTN Currently controlled  ESRD on HD HD via AVF MWF In the process setting up outpatient HD ( he was just released from the prison, he has not set up care in the community yet)  Anemia on chronic disease hgb 11, stable  Code Status: full  Family Communication: patient and family at bedside  Disposition Plan: home tomorrow after HD   Consultants:  Nephrology  Case manager  Social worker  Procedures:  HD  Antibiotics:  none   Objective: BP (!) 143/82 (BP Location: Right Arm)   Pulse 81   Temp 98.1 F (36.7 C) (Oral)   Resp 16   Ht 5\' 5"  (1.651 m)   Wt 64.7 kg (142 lb 10.2 oz)   SpO2 100%   BMI 23.74 kg/m   Intake/Output Summary (Last 24 hours) at 04/01/2018 1436 Last data filed at 04/01/2018 1030 Gross per 24 hour  Intake 660 ml  Output 0 ml  Net 660 ml   Filed Weights   03/31/18 0730 03/31/18 1215 03/31/18 2101  Weight: 66.2 kg (145 lb 15.1 oz) 64.6 kg (142 lb 6.7 oz) 64.7 kg (142 lb 10.2 oz)    Exam: Patient is examined daily including today on 04/01/2018, exams remain the same as of yesterday except that has changed    General:  NAD  Cardiovascular: RRR  Respiratory: CTABL  Abdomen: Soft/ND/NT, positive BS  Musculoskeletal: No Edema  Neuro: alert, oriented   Data Reviewed: Basic Metabolic Panel: Recent Labs  Lab 03/30/18 2222 03/31/18 0802 04/01/18 0622  NA 142 143 136  K 6.2* 5.7* 3.9  CL 100* 104 95*  CO2 21* 19* 25  GLUCOSE 98 89 95  BUN 102* 109* 40*  CREATININE 16.39* 17.43*  10.24*  CALCIUM 9.5 9.3 8.9  PHOS  --  8.4*  --    Liver Function Tests: Recent Labs  Lab 03/31/18 0802  ALBUMIN 3.7   No results for input(s): LIPASE, AMYLASE in the last 168 hours. No results for input(s): AMMONIA in the last 168 hours. CBC: Recent Labs  Lab 03/30/18 2222 03/31/18 0802  WBC 5.6 5.4  HGB 12.4* 11.0*  HCT 39.1 34.0*  MCV 104.0* 104.9*  PLT 203 163   Cardiac Enzymes:   No results for input(s): CKTOTAL, CKMB, CKMBINDEX, TROPONINI in the last 168 hours. BNP (last 3 results) No results for input(s): BNP in the last 8760 hours.  ProBNP (last 3 results) No results for input(s): PROBNP in the last 8760 hours.  CBG: No results for input(s): GLUCAP in the last 168 hours.  No results found for this or any previous visit (from the past 240 hour(s)).   Studies: No results found.  Scheduled Meds: . amLODipine  10 mg Oral Daily  . B-complex with vitamin C  1 tablet Oral Daily  . calcium carbonate  2 tablet Oral TID WC  . Chlorhexidine Gluconate Cloth  6 each Topical Q0600  . [START ON 04/02/2018] Chlorhexidine Gluconate Cloth  6 each Topical Q0600  . cloNIDine  0.3 mg Oral TID  . famotidine  40  mg Oral QHS  . heparin  5,000 Units Subcutaneous Q8H  . hydrALAZINE  25 mg Oral TID  . losartan  50 mg Oral QHS  . metoprolol tartrate  150 mg Oral BID  . patiromer  16.8 g Oral Daily  . polycarbophil  1,250 mg Oral BID  . sevelamer carbonate  3,200 mg Oral TID WC      Time spent: 66mins , more than 50% spent on coordination of care,  Case discussed with case manager , Education officer, museum, nephrology Need to set up care in the community /meds needs/transportation/insurance   I have personally reviewed and interpreted on  04/01/2018 daily labs, tele strips, imagings as discussed above under date review session and assessment and plans.  I reviewed all nursing notes, pharmacy notes, consultant notes,  vitals, pertinent old records  I have discussed plan of care as  described above with RN , patient and family on 04/01/2018   Florencia Reasons MD, PhD  [facility] Pager [phone]. If 7PM-7AM, please contact night-coverage at [email or web address], password [facility] 04/01/2018, 2:36 PM  LOS: 0 days

## 2018-04-01 NOTE — Progress Notes (Addendum)
Case Management Note  Patient Details  Name: Maurice Bell MRN: [id number] Date of Birth: 23-Sep-1956  Subjective/Objective: History of HTN, ESRD on HD. Admitted for Need for acute hemodialysis.  No OP Hemodialysis arrangements made when got discharged from prison.  No PCP noted.  Action/Plan: NCM spoke with financial counselor "Vivien Rota" regarding Medicare qualification process. Per Vivien Rota patient is on financial counselor Work Que to be followed up, she notify Janett Billow that patient is ESRD will be requiring dialysis; they assess Medicaid potential first.   No PCP noted, NCM made new patient appointment for patient at [facility]" for a Tuesday see AVS.   Patient Uninsured. NCM called [facility] and [place] inquired If clinical carries Medication "Renvela" in stock?  Per [facility] "Illene Regulus is a pricey drug and they prefer physicians uses an alternative if possible.  NCM will notify attending of information provided re: Renvela. NCM call pharmacist at [facility] to obtain 340B pricing for Renvela 3200 mg tid, states he will call me back.  Per Gerald Stabs with [facility] pharmacy can provide 270 tablets for $10 from 340B account or a month supply ordered would be around $500.  Would not have prescription ready until Monday April 04, 2018.  NCM advised [facility] we will send refill for Renvela for 270 tablets for the agreed cost of 10$ and patient will have to initiate the medication assistance program from [place].  NCM spoke with Pharmacist Gerald Stabs and provided him with patient information to be on the look out for Renvela Prescription.  NCM spoke with Attending Dr. Erlinda Hong advised to e-scribe Renvela to [facility] for 3 weeks worth to be paid by [facility] and patient will need the patient assistance program that can be started at [place]; e-scribe the rest of patient medications to [facility] and  patient does qualify for One Time Free Fill.   NCM in to speak with patient, Patient given 30 day supply of the following medications prior to discharge: Ranitidine, clonidine, amlodipine, hydralazine, losartan metoprolol.  NCM advised there is a prescription Renvela pending at the outpatient pharmacy that we need to obtain and pay $10 copay for patient.   Expected Discharge Date:  04/01/2018                Expected Discharge Plan:  Home/Self Care  In-House Referral:  Clinical Social Work: transportation  Discharge planning Services  CM Consult, [facility]  Status of Service:  In process, will continue to follow  Montel Culver, RN Nurse Case Manager

## 2018-04-02 DIAGNOSIS — E875 Hyperkalemia: Secondary | ICD-10-CM | POA: Diagnosis not present

## 2018-04-02 DIAGNOSIS — N186 End stage renal disease: Secondary | ICD-10-CM | POA: Diagnosis not present

## 2018-04-02 DIAGNOSIS — I120 Hypertensive chronic kidney disease with stage 5 chronic kidney disease or end stage renal disease: Secondary | ICD-10-CM | POA: Diagnosis not present

## 2018-04-02 DIAGNOSIS — Z992 Dependence on renal dialysis: Secondary | ICD-10-CM | POA: Diagnosis not present

## 2018-04-02 MED ORDER — HYDRALAZINE HCL 25 MG PO TABS: 25.0000 mg | ORAL_TABLET | Freq: Two times a day (BID) | ORAL | 0 refills | Status: DC

## 2018-04-02 MED ORDER — CLONIDINE HCL 0.1 MG PO TABS
0.1000 mg | ORAL_TABLET | Freq: Two times a day (BID) | ORAL | Status: DC
  Administered 2018-04-02: 0.1 mg via ORAL
  Filled 2018-04-02: qty 1

## 2018-04-02 MED ORDER — AMLODIPINE BESYLATE 10 MG PO TABS: 10.0000 mg | ORAL_TABLET | Freq: Every day | ORAL | Status: DC

## 2018-04-02 MED ORDER — CLONIDINE HCL 0.1 MG PO TABS: 0.1000 mg | ORAL_TABLET | Freq: Two times a day (BID) | ORAL | 0 refills | Status: DC

## 2018-04-02 MED ORDER — RENA-VITE PO TABS: 1.0000 | ORAL_TABLET | Freq: Every day | ORAL | Status: DC

## 2018-04-02 MED ORDER — AMLODIPINE BESYLATE 10 MG PO TABS: 10.0000 mg | ORAL_TABLET | Freq: Every day | ORAL | 0 refills | Status: DC

## 2018-04-02 MED ORDER — HYDRALAZINE HCL 25 MG PO TABS
25.0000 mg | ORAL_TABLET | Freq: Two times a day (BID) | ORAL | Status: DC
  Administered 2018-04-02: 25 mg via ORAL
  Filled 2018-04-02: qty 1

## 2018-04-02 NOTE — Procedures (Signed)
I was present at this session.  I have reviewed the session itself and made appropriate changes.  HD via LLA AVF , unfortunately both needle up so recirc.  Educate. Lower vol so lower bp meds. Maurice Bell 6/1/20198:01 AM

## 2018-04-02 NOTE — Plan of Care (Signed)
  Problem: Education: Goal: Knowledge of General Education information will improve Outcome: Progressing Note:  POC reviewed with pt.   

## 2018-04-02 NOTE — Discharge Summary (Signed)
Discharge Summary  Maurice Bell GLO:[id number] DOB: [birth year]  PCP: Patient, No Pcp Per  Admit date: 03/30/2018 Discharge date: 04/02/2018  Time spent: <23mins  Recommendations for Outpatient Follow-up:  1. F/u with PMD within a week  for hospital discharge follow up, repeat cbc/bmp at follow up. 2. F/u with nephrology  Discharge Diagnoses:  Active Hospital Problems   Diagnosis Date Noted  . Need for acute hemodialysis ([place]) 03/31/2018  . ESRD (end stage renal disease) ([place]) 03/31/2018  . HTN (hypertension) 03/31/2018    Resolved Hospital Problems  No resolved problems to display.    Discharge Condition: stable  Diet recommendation: dialysis diet  Filed Weights   04/01/18 2219 04/02/18 0732 04/02/18 1127  Weight: 64.3 kg (141 lb 11.2 oz) 64.5 kg (142 lb 3.2 oz) 62.3 kg (137 lb 5.6 oz)    History of present illness: (admitting MD Dr Alcario Drought) Patient coming from: Home  I have personally briefly reviewed patient's old medical records in [place]  Chief Complaint: Needs dialysis  HPI: Maurice Bell is a 62 y.o. male with medical history significant of ESRD for past 14 years.  Patient was just released from prison on Monday after being incarcerated for an extended period of time.  It appears that no concrete arrangements were made for him to start treatment, reached out to local kidney centers without success and was told to go to ED.  Missed dialysis Wed.  ED Course: In the ED he has been hypertensive.  Got all HTN meds on release except clonidine 0.3mg  TID that he was taking.    Hospital Course:  Principal Problem:   Need for acute hemodialysis ([place]) Active Problems:   ESRD (end stage renal disease) ([place])   HTN (hypertension)   Hyperkalemia (on presentation) -Due to missing HD -K6.2 on presentation, k normalized  at 3.9   HTN Currently controlled on meds listed below.  ESRD on HD HD via AVF MWF outpatient HD set up, he is to start  HD in [place] on TTS.  Anemia on chronic disease hgb 11, stable  Code Status: full  Family Communication: patient and family at bedside  Disposition Plan: home    Consultants:  Nephrology  Case manager  Social worker  Procedures:  HD  Antibiotics:  none   Discharge Exam: BP (!) 121/101   Pulse 88   Temp 97.9 F (36.6 C) (Oral)   Resp (!) 21   Ht 5\' 5"  (1.651 m)   Wt 62.3 kg (137 lb 5.6 oz)   SpO2 100%   BMI 22.86 kg/m   General: NAD Cardiovascular: RRR Respiratory: CTABL  Discharge Instructions You were cared for by a hospitalist during your hospital stay. If you have any questions about your discharge medications or the care you received while you were in the hospital after you are discharged, you can call the unit and asked to speak with the hospitalist on call if the hospitalist that took care of you is not available. Once you are discharged, your primary care physician will handle any further medical issues. Please note that NO REFILLS for any discharge medications will be authorized once you are discharged, as it is imperative that you return to your primary care physician (or establish a relationship with a primary care physician if you do not have one) for your aftercare needs so that they can reassess your need for medications and monitor your lab values.  Discharge Instructions    Diet general   Complete by:  As  directed    Dialysis diet   Increase activity slowly   Complete by:  As directed      Allergies as of 04/02/2018   No Known Allergies     Medication List    STOP taking these medications   ibuprofen 200 MG tablet Commonly known as:  ADVIL,MOTRIN     TAKE these medications   amLODipine 10 MG tablet Commonly known as:  NORVASC Take 1 tablet (10 mg total) by mouth at bedtime. What changed:  when to take this   B-complex with vitamin C tablet Take 1 tablet by mouth daily.   calcium carbonate 500 MG chewable  tablet Commonly known as:  TUMS - dosed in mg elemental calcium Chew 2 tablets (400 mg of elemental calcium total) by mouth 3 (three) times daily with meals.   cloNIDine 0.1 MG tablet Commonly known as:  CATAPRES Take 1 tablet (0.1 mg total) by mouth 2 (two) times daily. What changed:    medication strength  how much to take  when to take this   diphenhydrAMINE 50 MG capsule Commonly known as:  BENADRYL Take 1 capsule (50 mg total) by mouth every 12 (twelve) hours as needed for itching.   hydrALAZINE 25 MG tablet Commonly known as:  APRESOLINE Take 1 tablet (25 mg total) by mouth 2 (two) times daily. What changed:  when to take this   losartan 50 MG tablet Commonly known as:  COZAAR Take 1 tablet (50 mg total) by mouth at bedtime.   metoprolol tartrate 50 MG tablet Commonly known as:  LOPRESSOR Take 3 tablets (150 mg total) by mouth 2 (two) times daily.   polycarbophil 625 MG tablet Commonly known as:  FIBERCON Take 2 tablets (1,250 mg total) by mouth 2 (two) times daily.   ranitidine 300 MG tablet Commonly known as:  ZANTAC Take 1 tablet (300 mg total) by mouth at bedtime.   sevelamer carbonate 800 MG tablet Commonly known as:  RENVELA Take 4 tablets (3,200 mg total) by mouth 3 (three) times daily with meals.      No Known Allergies Follow-up Information     [facility] Follow up on 05/03/2018.   Why:  arrive at 8:30 am, bring photo ID and current medications Contact information: [place] [id number] [facility] Follow up.   Why:  Pick-up Renvela prescription on Monday 04/03/2018 Contact information: [address],  [place], [place] [address]       [facility] Follow up.   Why:  Go to pharmacy to seek assistance with Patient Assistance Program for the medication Renevela. Contact information:  [address]   [place], [place] [address]  [phone]           The results of significant diagnostics from this hospitalization (including imaging, microbiology, ancillary and laboratory) are listed below for reference.    Significant Diagnostic Studies: Dg Chest 2 View  Result Date: 03/31/2018 CLINICAL DATA:  62 y/o  M; shortness of breath. EXAM: CHEST - 2 VIEW COMPARISON:  10/15/2005 chest radiograph FINDINGS: Stable heart size and mediastinal contours are within normal limits. Nipple shadows over the lower lung zones. No focal consolidation. No pleural effusion or pneumothorax. The visualized skeletal structures are unremarkable. IMPRESSION: No acute pulmonary process identified. Electronically Signed   By: Kristine Garbe M.D.   On: 03/31/2018 01:37    Microbiology: No results found  for this or any previous visit (from the past 240 hour(s)).   Labs: Basic Metabolic Panel: Recent Labs  Lab 03/30/18 2222 03/31/18 0802 04/01/18 0622  NA 142 143 136  K 6.2* 5.7* 3.9  CL 100* 104 95*  CO2 21* 19* 25  GLUCOSE 98 89 95  BUN 102* 109* 40*  CREATININE 16.39* 17.43* 10.24*  CALCIUM 9.5 9.3 8.9  PHOS  --  8.4*  --    Liver Function Tests: Recent Labs  Lab 03/31/18 0802  ALBUMIN 3.7   No results for input(s): LIPASE, AMYLASE in the last 168 hours. No results for input(s): AMMONIA in the last 168 hours. CBC: Recent Labs  Lab 03/30/18 2222 03/31/18 0802  WBC 5.6 5.4  HGB 12.4* 11.0*  HCT 39.1 34.0*  MCV 104.0* 104.9*  PLT 203 163   Cardiac Enzymes: No results for input(s): CKTOTAL, CKMB, CKMBINDEX, TROPONINI in the last 168 hours. BNP: BNP (last 3 results) No results for input(s): BNP in the last 8760 hours.  ProBNP (last 3 results) No results for input(s): PROBNP in the last 8760 hours.  CBG: No results for input(s): GLUCAP in the last 168 hours.     Signed:  Florencia Reasons MD, PhD  [facility] 04/02/2018, 3:32 PM

## 2018-04-02 NOTE — Progress Notes (Signed)
Patient Discharge: Disposition: Patient discharged to home. Education: Reviewed medications, prescriptions, follow-up appointments and discharge instructions, verbalized understanding. IV: Discontinued IV before discharge. Telemetry: Discontinued Tele before discharge. Transportation: Patient refused w/c and walked out of the unit accompanied by the family members. Belongings: Patient took all his belongings with him.

## 2018-04-02 NOTE — Progress Notes (Signed)
Subjective: Interval History: has complaints wants to get out ASAP.  Objective: Vital signs in last 24 hours: Temp:  [97.8 F (36.6 C)-98.3 F (36.8 C)] 97.8 F (36.6 C) (06/01 0549) Pulse Rate:  [75-81] 75 (06/01 0549) Resp:  [14-18] 14 (06/01 0549) BP: (124-143)/(76-86) 126/76 (06/01 0549) SpO2:  [100 %] 100 % (06/01 0549) Weight:  [64.3 kg (141 lb 11.2 oz)] 64.3 kg (141 lb 11.2 oz) (05/31 2219) Weight change: -1.925 kg (-4 lb 3.9 oz)  Intake/Output from previous day: 05/31 0701 - 06/01 0700 In: 300 [P.O.:300] Out: 0  Intake/Output this shift: No intake/output data recorded.  General appearance: alert and no distress Resp: diminished breath sounds bilaterally Cardio: S1, S2 normal and systolic murmur: systolic ejection 2/6, decrescendo at 2nd left intercostal space GI: liver down 4 cm Extremities: AVF LLA  Lab Results: Recent Labs    03/30/18 2222 03/31/18 0802  WBC 5.6 5.4  HGB 12.4* 11.0*  HCT 39.1 34.0*  PLT 203 163   BMET:  Recent Labs    03/31/18 0802 04/01/18 0622  NA 143 136  K 5.7* 3.9  CL 104 95*  CO2 19* 25  GLUCOSE 89 95  BUN 109* 40*  CREATININE 17.43* 10.24*  CALCIUM 9.3 8.9   No results for input(s): PTH in the last 72 hours. Iron Studies: No results for input(s): IRON, TIBC, TRANSFERRIN, FERRITIN in the last 72 hours.  Studies/Results: No results found.  I have reviewed the patient's current medications.  Assessment/Plan: 1 ESRD  For HD. Arranging outpatient 2 Anemia ok 3 HPTH vit D 4 HTN lower vol, lower meds 5 Incarceration/social P HD, lower bp meds, vol, CLIP    LOS: 0 days   Maurice Bell 04/02/2018,7:59 AM

## 2018-04-04 MED FILL — hydrALAZINE HCL 25 MG TABS: 25 | 30 days supply | Qty: 60 | Fill #0

## 2018-04-04 MED FILL — cloNIDine HCL 0.1 MG TABS: 0.1 | 30 days supply | Qty: 60 | Fill #0

## 2018-04-05 DIAGNOSIS — D509 Iron deficiency anemia, unspecified: Secondary | ICD-10-CM | POA: Diagnosis not present

## 2018-04-05 DIAGNOSIS — D631 Anemia in chronic kidney disease: Secondary | ICD-10-CM | POA: Diagnosis not present

## 2018-04-05 DIAGNOSIS — N2581 Secondary hyperparathyroidism of renal origin: Secondary | ICD-10-CM | POA: Diagnosis not present

## 2018-04-05 DIAGNOSIS — N186 End stage renal disease: Secondary | ICD-10-CM | POA: Diagnosis not present

## 2018-04-07 DIAGNOSIS — N186 End stage renal disease: Secondary | ICD-10-CM | POA: Diagnosis not present

## 2018-04-07 DIAGNOSIS — D631 Anemia in chronic kidney disease: Secondary | ICD-10-CM | POA: Diagnosis not present

## 2018-04-07 DIAGNOSIS — N2581 Secondary hyperparathyroidism of renal origin: Secondary | ICD-10-CM | POA: Diagnosis not present

## 2018-04-07 DIAGNOSIS — D509 Iron deficiency anemia, unspecified: Secondary | ICD-10-CM | POA: Diagnosis not present

## 2018-04-09 DIAGNOSIS — D631 Anemia in chronic kidney disease: Secondary | ICD-10-CM | POA: Diagnosis not present

## 2018-04-09 DIAGNOSIS — N2581 Secondary hyperparathyroidism of renal origin: Secondary | ICD-10-CM | POA: Diagnosis not present

## 2018-04-09 DIAGNOSIS — N186 End stage renal disease: Secondary | ICD-10-CM | POA: Diagnosis not present

## 2018-04-09 DIAGNOSIS — D509 Iron deficiency anemia, unspecified: Secondary | ICD-10-CM | POA: Diagnosis not present

## 2018-04-12 DIAGNOSIS — N2581 Secondary hyperparathyroidism of renal origin: Secondary | ICD-10-CM | POA: Diagnosis not present

## 2018-04-12 DIAGNOSIS — D509 Iron deficiency anemia, unspecified: Secondary | ICD-10-CM | POA: Diagnosis not present

## 2018-04-12 DIAGNOSIS — N186 End stage renal disease: Secondary | ICD-10-CM | POA: Diagnosis not present

## 2018-04-12 DIAGNOSIS — D631 Anemia in chronic kidney disease: Secondary | ICD-10-CM | POA: Diagnosis not present

## 2018-04-14 DIAGNOSIS — N186 End stage renal disease: Secondary | ICD-10-CM | POA: Diagnosis not present

## 2018-04-14 DIAGNOSIS — D631 Anemia in chronic kidney disease: Secondary | ICD-10-CM | POA: Diagnosis not present

## 2018-04-14 DIAGNOSIS — D509 Iron deficiency anemia, unspecified: Secondary | ICD-10-CM | POA: Diagnosis not present

## 2018-04-14 DIAGNOSIS — N2581 Secondary hyperparathyroidism of renal origin: Secondary | ICD-10-CM | POA: Diagnosis not present

## 2018-04-16 DIAGNOSIS — N186 End stage renal disease: Secondary | ICD-10-CM | POA: Diagnosis not present

## 2018-04-16 DIAGNOSIS — D631 Anemia in chronic kidney disease: Secondary | ICD-10-CM | POA: Diagnosis not present

## 2018-04-16 DIAGNOSIS — N2581 Secondary hyperparathyroidism of renal origin: Secondary | ICD-10-CM | POA: Diagnosis not present

## 2018-04-16 DIAGNOSIS — D509 Iron deficiency anemia, unspecified: Secondary | ICD-10-CM | POA: Diagnosis not present

## 2018-04-19 ENCOUNTER — Emergency Department (HOSPITAL_COMMUNITY): Payer: Medicaid Other

## 2018-04-19 ENCOUNTER — Emergency Department (HOSPITAL_COMMUNITY)
Admission: EM | Admit: 2018-04-19 | Discharge: 2018-04-19 | Disposition: A | Discharge status: Home / Self Care | Payer: Medicaid Other | Attending: Emergency Medicine | Admitting: Emergency Medicine

## 2018-04-19 ENCOUNTER — Encounter (HOSPITAL_COMMUNITY): Payer: Self-pay | Admitting: Emergency Medicine

## 2018-04-19 DIAGNOSIS — N186 End stage renal disease: Secondary | ICD-10-CM | POA: Insufficient documentation

## 2018-04-19 DIAGNOSIS — Z87891 Personal history of nicotine dependence: Secondary | ICD-10-CM | POA: Diagnosis not present

## 2018-04-19 DIAGNOSIS — I120 Hypertensive chronic kidney disease with stage 5 chronic kidney disease or end stage renal disease: Secondary | ICD-10-CM | POA: Diagnosis not present

## 2018-04-19 DIAGNOSIS — Z79899 Other long term (current) drug therapy: Secondary | ICD-10-CM | POA: Insufficient documentation

## 2018-04-19 DIAGNOSIS — D509 Iron deficiency anemia, unspecified: Secondary | ICD-10-CM | POA: Diagnosis not present

## 2018-04-19 DIAGNOSIS — M79675 Pain in left toe(s): Secondary | ICD-10-CM | POA: Diagnosis not present

## 2018-04-19 DIAGNOSIS — D631 Anemia in chronic kidney disease: Secondary | ICD-10-CM | POA: Diagnosis not present

## 2018-04-19 DIAGNOSIS — Z992 Dependence on renal dialysis: Secondary | ICD-10-CM | POA: Insufficient documentation

## 2018-04-19 DIAGNOSIS — N2581 Secondary hyperparathyroidism of renal origin: Secondary | ICD-10-CM | POA: Diagnosis not present
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
## 2018-04-19 NOTE — ED Notes (Signed)
Pt alert and oriented in NAD. Pt verbalized understanding of discharge instructions. 

## 2018-04-19 NOTE — Discharge Instructions (Addendum)
Please read attached information. If you experience any new or worsening signs or symptoms please return to the emergency room for evaluation. Please follow-up with your primary care provider or specialist as discussed.  °

## 2018-04-21 DIAGNOSIS — N2581 Secondary hyperparathyroidism of renal origin: Secondary | ICD-10-CM | POA: Diagnosis not present

## 2018-04-21 DIAGNOSIS — N186 End stage renal disease: Secondary | ICD-10-CM | POA: Diagnosis not present

## 2018-04-21 DIAGNOSIS — D509 Iron deficiency anemia, unspecified: Secondary | ICD-10-CM | POA: Diagnosis not present

## 2018-04-21 DIAGNOSIS — D631 Anemia in chronic kidney disease: Secondary | ICD-10-CM | POA: Diagnosis not present

## 2018-04-23 DIAGNOSIS — D509 Iron deficiency anemia, unspecified: Secondary | ICD-10-CM | POA: Diagnosis not present

## 2018-04-23 DIAGNOSIS — N186 End stage renal disease: Secondary | ICD-10-CM | POA: Diagnosis not present

## 2018-04-23 DIAGNOSIS — D631 Anemia in chronic kidney disease: Secondary | ICD-10-CM | POA: Diagnosis not present

## 2018-04-23 DIAGNOSIS — N2581 Secondary hyperparathyroidism of renal origin: Secondary | ICD-10-CM | POA: Diagnosis not present

## 2018-04-26 DIAGNOSIS — D631 Anemia in chronic kidney disease: Secondary | ICD-10-CM | POA: Diagnosis not present

## 2018-04-26 DIAGNOSIS — N2581 Secondary hyperparathyroidism of renal origin: Secondary | ICD-10-CM | POA: Diagnosis not present

## 2018-04-26 DIAGNOSIS — D509 Iron deficiency anemia, unspecified: Secondary | ICD-10-CM | POA: Diagnosis not present

## 2018-04-26 DIAGNOSIS — N186 End stage renal disease: Secondary | ICD-10-CM | POA: Diagnosis not present

## 2018-04-28 DIAGNOSIS — N2581 Secondary hyperparathyroidism of renal origin: Secondary | ICD-10-CM | POA: Diagnosis not present

## 2018-04-28 DIAGNOSIS — D509 Iron deficiency anemia, unspecified: Secondary | ICD-10-CM | POA: Diagnosis not present

## 2018-04-28 DIAGNOSIS — N186 End stage renal disease: Secondary | ICD-10-CM | POA: Diagnosis not present

## 2018-04-28 DIAGNOSIS — D631 Anemia in chronic kidney disease: Secondary | ICD-10-CM | POA: Diagnosis not present

## 2018-04-30 DIAGNOSIS — D509 Iron deficiency anemia, unspecified: Secondary | ICD-10-CM | POA: Diagnosis not present

## 2018-04-30 DIAGNOSIS — N186 End stage renal disease: Secondary | ICD-10-CM | POA: Diagnosis not present

## 2018-04-30 DIAGNOSIS — N2581 Secondary hyperparathyroidism of renal origin: Secondary | ICD-10-CM | POA: Diagnosis not present

## 2018-04-30 DIAGNOSIS — D631 Anemia in chronic kidney disease: Secondary | ICD-10-CM | POA: Diagnosis not present

## 2018-05-01 DIAGNOSIS — N186 End stage renal disease: Secondary | ICD-10-CM | POA: Diagnosis not present

## 2018-05-01 DIAGNOSIS — Z992 Dependence on renal dialysis: Secondary | ICD-10-CM | POA: Diagnosis not present

## 2018-05-01 DIAGNOSIS — I129 Hypertensive chronic kidney disease with stage 1 through stage 4 chronic kidney disease, or unspecified chronic kidney disease: Secondary | ICD-10-CM | POA: Diagnosis not present

## 2018-05-03 ENCOUNTER — Encounter (INDEPENDENT_AMBULATORY_CARE_PROVIDER_SITE_OTHER): Payer: Self-pay | Admitting: Physician Assistant

## 2018-05-03 ENCOUNTER — Ambulatory Visit (INDEPENDENT_AMBULATORY_CARE_PROVIDER_SITE_OTHER): Payer: Self-pay | Admitting: Physician Assistant

## 2018-05-03 VITALS — BP 100/59 | HR 80 | Temp 98.6°F | Resp 18 | Ht 64.0 in | Wt 148.0 lb

## 2018-05-03 DIAGNOSIS — Z760 Encounter for issue of repeat prescription: Secondary | ICD-10-CM

## 2018-05-03 DIAGNOSIS — B182 Chronic viral hepatitis C: Secondary | ICD-10-CM

## 2018-05-03 DIAGNOSIS — D509 Iron deficiency anemia, unspecified: Secondary | ICD-10-CM | POA: Diagnosis not present

## 2018-05-03 DIAGNOSIS — Z114 Encounter for screening for human immunodeficiency virus [HIV]: Secondary | ICD-10-CM | POA: Diagnosis not present

## 2018-05-03 DIAGNOSIS — N186 End stage renal disease: Secondary | ICD-10-CM | POA: Diagnosis not present

## 2018-05-03 DIAGNOSIS — G8929 Other chronic pain: Secondary | ICD-10-CM

## 2018-05-03 DIAGNOSIS — N2581 Secondary hyperparathyroidism of renal origin: Secondary | ICD-10-CM | POA: Diagnosis not present

## 2018-05-03 DIAGNOSIS — D631 Anemia in chronic kidney disease: Secondary | ICD-10-CM | POA: Diagnosis not present

## 2018-05-03 DIAGNOSIS — M5441 Lumbago with sciatica, right side: Secondary | ICD-10-CM

## 2018-05-03 DIAGNOSIS — B009 Herpesviral infection, unspecified: Secondary | ICD-10-CM

## 2018-05-03 DIAGNOSIS — M5442 Lumbago with sciatica, left side: Secondary | ICD-10-CM

## 2018-05-03 DIAGNOSIS — Z79899 Other long term (current) drug therapy: Secondary | ICD-10-CM

## 2018-05-03 DIAGNOSIS — Z992 Dependence on renal dialysis: Secondary | ICD-10-CM

## 2018-05-03 MED ORDER — RANITIDINE HCL 300 MG PO TABS: 300.0000 mg | ORAL_TABLET | Freq: Every day | ORAL | 5 refills | Status: DC

## 2018-05-03 MED ORDER — SEVELAMER CARBONATE 800 MG PO TABS: 3200.0000 mg | ORAL_TABLET | Freq: Three times a day (TID) | ORAL | 5 refills | Status: DC

## 2018-05-03 MED ORDER — AMLODIPINE BESYLATE 10 MG PO TABS: 10.0000 mg | ORAL_TABLET | Freq: Every day | ORAL | 5 refills | Status: DC

## 2018-05-03 MED ORDER — ACETAMINOPHEN 500 MG PO TABS: 500.0000 mg | ORAL_TABLET | Freq: Three times a day (TID) | ORAL | 2 refills | Status: DC | PRN

## 2018-05-03 MED ORDER — METOPROLOL TARTRATE 50 MG PO TABS: 150.0000 mg | ORAL_TABLET | Freq: Two times a day (BID) | ORAL | 5 refills | Status: DC

## 2018-05-03 MED ORDER — CLONIDINE HCL 0.1 MG PO TABS: 0.1000 mg | ORAL_TABLET | Freq: Two times a day (BID) | ORAL | 5 refills | Status: DC

## 2018-05-03 MED ORDER — LOSARTAN POTASSIUM 50 MG PO TABS: 50.0000 mg | ORAL_TABLET | Freq: Every day | ORAL | 5 refills | Status: DC

## 2018-05-03 MED ORDER — CALCIUM CARBONATE ANTACID 500 MG PO CHEW: 2.0000 | CHEWABLE_TABLET | Freq: Three times a day (TID) | ORAL | 5 refills | Status: DC

## 2018-05-03 MED ORDER — B COMPLEX-C PO TABS: 1.0000 | ORAL_TABLET | Freq: Every day | ORAL | 5 refills | Status: DC

## 2018-05-03 MED ORDER — VALACYCLOVIR HCL 500 MG PO TABS: 500.0000 mg | ORAL_TABLET | Freq: Every day | ORAL | 11 refills | Status: DC

## 2018-05-03 MED FILL — SEVELAMER CARBONATE 800 MG: 800 | 7 days supply | Qty: 90 | Fill #0

## 2018-05-03 MED FILL — VALACYCLOVIR HCL 500 MG TAB: 500 | 30 days supply | Qty: 30 | Fill #0

## 2018-05-03 MED FILL — LOSARTAN POTASSIUM 50 MG TA: 50 | 30 days supply | Qty: 30 | Fill #0

## 2018-05-03 MED FILL — METOPROLOL TARTRATE 50 MG T: 50 | 10 days supply | Qty: 60 | Fill #0

## 2018-05-03 MED FILL — cloNIDine HCL 0.1 MG TABS: 0.1 | 30 days supply | Qty: 60 | Fill #0

## 2018-05-03 MED FILL — AMLODIPINE BESYLATE 10 MG T: 10 | 30 days supply | Qty: 30 | Fill #0

## 2018-05-03 MED FILL — raNITIdine HCL 300 MG TABS: 300 | 30 days supply | Qty: 30 | Fill #0

## 2018-05-03 NOTE — Patient Instructions (Signed)
Sciatica Sciatica is pain, numbness, weakness, or tingling along your sciatic nerve. The sciatic nerve starts in the lower back and goes down the back of each leg. Sciatica happens when this nerve is pinched or has pressure put on it. Sciatica usually goes away on its own or with treatment. Sometimes, sciatica may keep coming back (recur). Follow these instructions at home: Medicines  Take over-the-counter and prescription medicines only as told by your doctor.  Do not drive or use heavy machinery while taking prescription pain medicine. Managing pain  If directed, put ice on the affected area. ? Put ice in a plastic bag. ? Place a towel between your skin and the bag. ? Leave the ice on for 20 minutes, 2-3 times a day.  After icing, apply heat to the affected area before you exercise or as often as told by your doctor. Use the heat source that your doctor tells you to use, such as a moist heat pack or a heating pad. ? Place a towel between your skin and the heat source. ? Leave the heat on for 20-30 minutes. ? Remove the heat if your skin turns bright red. This is especially important if you are unable to feel pain, heat, or cold. You may have a greater risk of getting burned. Activity  Return to your normal activities as told by your doctor. Ask your doctor what activities are safe for you. ? Avoid activities that make your sciatica worse.  Take short rests during the day. Rest in a lying or standing position. This is usually better than sitting to rest. ? When you rest for a long time, do some physical activity or stretching between periods of rest. ? Avoid sitting for a long time without moving. Get up and move around at least one time each hour.  Exercise and stretch regularly, as told by your doctor.  Do not lift anything that is heavier than 10 lb (4.5 kg) while you have symptoms of sciatica. ? Avoid lifting heavy things even when you do not have symptoms. ? Avoid lifting heavy  things over and over.  When you lift objects, always lift in a way that is safe for your body. To do this, you should: ? Bend your knees. ? Keep the object close to your body. ? Avoid twisting. General instructions  Use good posture. ? Avoid leaning forward when you are sitting. ? Avoid hunching over when you are standing.  Stay at a healthy weight.  Wear comfortable shoes that support your feet. Avoid wearing high heels.  Avoid sleeping on a mattress that is too soft or too hard. You might have less pain if you sleep on a mattress that is firm enough to support your back.  Keep all follow-up visits as told by your doctor. This is important. Contact a doctor if:  You have pain that: ? Wakes you up when you are sleeping. ? Gets worse when you lie down. ? Is worse than the pain you have had in the past. ? Lasts longer than 4 weeks.  You lose weight for without trying. Get help right away if:  You cannot control when you pee (urinate) or poop (have a bowel movement).  You have weakness in any of these areas and it gets worse. ? Lower back. ? Lower belly (pelvis). ? Butt (buttocks). ? Legs.  You have redness or swelling of your back.  You have a burning feeling when you pee. This information is not intended to replace   advice given to you by your health care provider. Make sure you discuss any questions you have with your health care provider. Document Released: 07/28/2008 Document Revised: 03/26/2016 Document Reviewed: 06/28/2015 Elsevier Interactive Patient Education  2018 [employer]  

## 2018-05-03 NOTE — Progress Notes (Signed)
Subjective:  Patient ID: Maurice Bell, male    DOB: May 13, 1956  Age: 62 y.o. MRN: [id number]  CC: back pain  HPI Maurice Bell is a 62 y.o. male with a medical history of anxiety, hepatitis C, HSV2, HTN, ESRD, and stroke presents as a new patient with complaint of low back pain. Onset of low back pain six months ago. Attributed to arthritis. Had xrays approximately 4 months ago and was told he had "arthritis pushing down on disc and disc is pushing down on nerve". Had originally taken muscle relaxants with no relief. Has not taken anything for pain since then. Does not endorse saddle paresthesia, urinary incontinence, fecal incontinence, weakness, paralysis, CP, palpitations, SOB, HA, abdominal pain, f/c/n/v, or GI/GU sxs.    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 Also would like treatment for HCV. Has reportedly never previously received treatment for HCV.       Outpatient Medications Prior to Visit  Medication Sig Dispense Refill  . amLODipine (NORVASC) 10 MG tablet Take 1 tablet (10 mg total) by mouth at bedtime. 30 tablet 0  . B Complex-C (B-COMPLEX WITH VITAMIN C) tablet Take 1 tablet by mouth daily. 30 tablet 0  . calcium carbonate (TUMS - DOSED IN MG ELEMENTAL CALCIUM) 500 MG chewable tablet Chew 2 tablets (400 mg of elemental calcium total) by mouth 3 (three) times daily with meals. 90 tablet 0  . cloNIDine (CATAPRES) 0.1 MG tablet Take 1 tablet (0.1 mg total) by mouth 2 (two) times daily. 60 tablet 0  . diphenhydrAMINE (BENADRYL) 50 MG capsule Take 1 capsule (50 mg total) by mouth every 12 (twelve) hours as needed for itching. 30 capsule 0  . hydrALAZINE (APRESOLINE) 25 MG tablet Take 1 tablet (25 mg total) by mouth 2 (two) times daily. 90 tablet 0  . losartan (COZAAR) 50 MG tablet Take 1 tablet (50 mg total) by mouth at bedtime. 30 tablet 1  . metoprolol tartrate (LOPRESSOR) 50 MG tablet Take 3 tablets (150 mg total) by mouth 2 (two)  times daily. 60 tablet 1  . ranitidine (ZANTAC) 300 MG tablet Take 1 tablet (300 mg total) by mouth at bedtime. 30 tablet 0  . sevelamer carbonate (RENVELA) 800 MG tablet Take 4 tablets (3,200 mg total) by mouth 3 (three) times daily with meals. 90 tablet 1  . polycarbophil (FIBERCON) 625 MG tablet Take 2 tablets (1,250 mg total) by mouth 2 (two) times daily. (Patient not taking: Reported on 05/03/2018) 60 tablet 0   No facility-administered medications prior to visit.      ROS Review of Systems  Constitutional: Negative for chills, fever and malaise/fatigue.  Eyes: Negative for blurred vision.  Respiratory: Negative for shortness of breath.   Cardiovascular: Negative for chest pain and palpitations.  Gastrointestinal: Negative for abdominal pain and nausea.  Genitourinary: Negative for dysuria and hematuria.  Musculoskeletal: Positive for back pain. Negative for joint pain and myalgias.  Skin: Negative for rash.       Genital herpes outbreak  Neurological: Negative for tingling and headaches.  Psychiatric/Behavioral: Negative for depression. The patient is not nervous/anxious.     Objective:  Ht 5\' 4"  (1.626 m)   Wt 148 lb (67.1 kg)   BMI 25.40 kg/m   BP/Weight 05/03/2018 11/28/5168 0/11/7492  Systolic BP - 496 759  Diastolic BP - 96 163  Wt. (Lbs) 148 - 137.35  BMI 25.4 - 22.86      Physical Exam  Constitutional: He is  oriented to person, place, and time.  Well developed, well nourished, NAD, polite  HENT:  Head: Normocephalic and atraumatic.  Eyes: No scleral icterus.  Neck: Normal range of motion. Neck supple. No thyromegaly present.  Cardiovascular: Normal rate, regular rhythm and normal heart sounds.  Pulmonary/Chest: Effort normal and breath sounds normal.  Abdominal: Soft. Bowel sounds are normal. There is no tenderness.  Musculoskeletal: He exhibits no edema.  Neurological: He is alert and oriented to person, place, and time.  Skin: Skin is warm and dry. No rash  noted. No erythema. No pallor.  Psychiatric: He has a normal mood and affect. His behavior is normal. Thought content normal.  Vitals reviewed.    Assessment & Plan:   1. Chronic bilateral low back pain with bilateral sciatica - DG Lumbar Spine Complete; Future - Begin acetaminophen (TYLENOL) 500 MG tablet; Take 1 tablet (500 mg total) by mouth every 8 (eight) hours as needed.  Dispense: 60 tablet; Refill: 2  2. HSV-2 infection - Begin valACYclovir (VALTREX) 500 MG tablet; Take 1 tablet (500 mg total) by mouth daily.  Dispense: 30 tablet; Refill: 11  3. Screening for HIV (human immunodeficiency virus) - HIV antibody  4. Medication refill - sevelamer carbonate (RENVELA) 800 MG tablet; Take 4 tablets (3,200 mg total) by mouth 3 (three) times daily with meals.  Dispense: 90 tablet; Refill: 5 - ranitidine (ZANTAC) 300 MG tablet; Take 1 tablet (300 mg total) by mouth at bedtime.  Dispense: 30 tablet; Refill: 5 - metoprolol tartrate (LOPRESSOR) 50 MG tablet; Take 3 tablets (150 mg total) by mouth 2 (two) times daily.  Dispense: 60 tablet; Refill: 5 - losartan (COZAAR) 50 MG tablet; Take 1 tablet (50 mg total) by mouth at bedtime.  Dispense: 30 tablet; Refill: 5 - cloNIDine (CATAPRES) 0.1 MG tablet; Take 1 tablet (0.1 mg total) by mouth 2 (two) times daily.  Dispense: 60 tablet; Refill: 5 - calcium carbonate (TUMS - DOSED IN MG ELEMENTAL CALCIUM) 500 MG chewable tablet; Chew 2 tablets (400 mg of elemental calcium total) by mouth 3 (three) times daily with meals.  Dispense: 90 tablet; Refill: 5 - B Complex-C (B-COMPLEX WITH VITAMIN C) tablet; Take 1 tablet by mouth daily.  Dispense: 30 tablet; Refill: 5 - amLODipine (NORVASC) 10 MG tablet; Take 1 tablet (10 mg total) by mouth at bedtime.  Dispense: 30 tablet; Refill: 5  5. High risk medication use - Begin acetaminophen (TYLENOL) 500 MG tablet; Take 1 tablet (500 mg total) by mouth every 8 (eight) hours as needed.  Dispense: 60 tablet; Refill:  2 - CMP - HIV  6. Chronic hepatitis C without hepatic coma (HCC) - Hepatitis panel, acute - Will refer to ID once CAFA is complete.  7. ESRD on dialysis ([place]) - ANA w/Reflex   Meds ordered this encounter  Medications  . valACYclovir (VALTREX) 500 MG tablet    Sig: Take 1 tablet (500 mg total) by mouth daily.    Dispense:  30 tablet    Refill:  11    Order Specific Question:   Supervising Provider    Answer:   Charlott Rakes [[id number]]  . sevelamer carbonate (RENVELA) 800 MG tablet    Sig: Take 4 tablets (3,200 mg total) by mouth 3 (three) times daily with meals.    Dispense:  90 tablet    Refill:  5    Order Specific Question:   Supervising Provider    Answer:   Charlott Rakes [[id number]]  . ranitidine (ZANTAC) 300 MG  tablet    Sig: Take 1 tablet (300 mg total) by mouth at bedtime.    Dispense:  30 tablet    Refill:  5    Order Specific Question:   Supervising Provider    Answer:   Charlott Rakes [[id number]]  . metoprolol tartrate (LOPRESSOR) 50 MG tablet    Sig: Take 3 tablets (150 mg total) by mouth 2 (two) times daily.    Dispense:  60 tablet    Refill:  5    Order Specific Question:   Supervising Provider    Answer:   Charlott Rakes [[id number]]  . losartan (COZAAR) 50 MG tablet    Sig: Take 1 tablet (50 mg total) by mouth at bedtime.    Dispense:  30 tablet    Refill:  5    Order Specific Question:   Supervising Provider    Answer:   Charlott Rakes [[id number]]  . cloNIDine (CATAPRES) 0.1 MG tablet    Sig: Take 1 tablet (0.1 mg total) by mouth 2 (two) times daily.    Dispense:  60 tablet    Refill:  5    Order Specific Question:   Supervising Provider    Answer:   Charlott Rakes [[id number]]  . calcium carbonate (TUMS - DOSED IN MG ELEMENTAL CALCIUM) 500 MG chewable tablet    Sig: Chew 2 tablets (400 mg of elemental calcium total) by mouth 3 (three) times daily with meals.    Dispense:  90 tablet    Refill:  5    Order Specific Question:   Supervising Provider    Answer:   Charlott Rakes [[id number]]  . B Complex-C (B-COMPLEX WITH VITAMIN C) tablet    Sig: Take 1 tablet by mouth daily.    Dispense:  30 tablet    Refill:  5    Order Specific Question:   Supervising Provider    Answer:   Charlott Rakes [[id number]]  . amLODipine (NORVASC) 10 MG tablet    Sig: Take 1 tablet (10 mg total) by mouth at bedtime.    Dispense:  30 tablet    Refill:  5    Order Specific Question:   Supervising Provider    Answer:   Charlott Rakes [[id number]]  . acetaminophen (TYLENOL) 500 MG tablet    Sig: Take 1 tablet (500 mg total) by mouth every 8 (eight) hours as needed.    Dispense:  60 tablet    Refill:  2    Order Specific Question:   Supervising Provider    Answer:   Charlott Rakes [[id number]]    Follow-up: Return in about 6 weeks (around 06/14/2018) for f/u BP.   [name] PA

## 2018-05-04 LAB — HIV ANTIBODY (ROUTINE TESTING W REFLEX): HIV Screen 4th Generation wRfx: NONREACTIVE

## 2018-05-06 ENCOUNTER — Telehealth (INDEPENDENT_AMBULATORY_CARE_PROVIDER_SITE_OTHER): Payer: Self-pay

## 2018-05-06 NOTE — Telephone Encounter (Signed)
-----   Message from [name], PA-C sent at 05/04/2018  8:27 AM EDT ----- HIV negative.

## 2018-05-06 NOTE — Telephone Encounter (Signed)
Patient aware of negative HIV. [name], CMA

## 2018-05-07 DIAGNOSIS — D509 Iron deficiency anemia, unspecified: Secondary | ICD-10-CM | POA: Diagnosis not present

## 2018-05-07 DIAGNOSIS — N2581 Secondary hyperparathyroidism of renal origin: Secondary | ICD-10-CM | POA: Diagnosis not present

## 2018-05-07 DIAGNOSIS — N186 End stage renal disease: Secondary | ICD-10-CM | POA: Diagnosis not present

## 2018-05-07 DIAGNOSIS — D631 Anemia in chronic kidney disease: Secondary | ICD-10-CM | POA: Diagnosis not present

## 2018-05-10 DIAGNOSIS — N2581 Secondary hyperparathyroidism of renal origin: Secondary | ICD-10-CM | POA: Diagnosis not present

## 2018-05-10 DIAGNOSIS — D509 Iron deficiency anemia, unspecified: Secondary | ICD-10-CM | POA: Diagnosis not present

## 2018-05-10 DIAGNOSIS — N186 End stage renal disease: Secondary | ICD-10-CM | POA: Diagnosis not present

## 2018-05-10 DIAGNOSIS — D631 Anemia in chronic kidney disease: Secondary | ICD-10-CM | POA: Diagnosis not present

## 2018-05-10 LAB — COMPREHENSIVE METABOLIC PANEL

## 2018-05-10 LAB — ANA W/REFLEX: ANA: NEGATIVE

## 2018-05-10 LAB — HEPATITIS PANEL, ACUTE
HEP A IGM: NEGATIVE
Hep B C IgM: NEGATIVE
Hep C Virus Ab: >11 s/co ratio — ABNORMAL HIGH (ref 0.0–0.9)
Hepatitis B Surface Ag: NEGATIVE

## 2018-05-12 ENCOUNTER — Other Ambulatory Visit (INDEPENDENT_AMBULATORY_CARE_PROVIDER_SITE_OTHER): Payer: Self-pay | Admitting: Physician Assistant

## 2018-05-12 DIAGNOSIS — D509 Iron deficiency anemia, unspecified: Secondary | ICD-10-CM | POA: Diagnosis not present

## 2018-05-12 DIAGNOSIS — N186 End stage renal disease: Secondary | ICD-10-CM | POA: Diagnosis not present

## 2018-05-12 DIAGNOSIS — Z0189 Encounter for other specified special examinations: Secondary | ICD-10-CM

## 2018-05-12 DIAGNOSIS — N2581 Secondary hyperparathyroidism of renal origin: Secondary | ICD-10-CM | POA: Diagnosis not present

## 2018-05-12 DIAGNOSIS — D631 Anemia in chronic kidney disease: Secondary | ICD-10-CM | POA: Diagnosis not present

## 2018-05-12 NOTE — Progress Notes (Signed)
1. Encounter for laboratory test -Lab did not report results for original CMP order due to "laboratory accident". - CMP reordered and pt contacted for redraw.     Follow-up: No follow-ups on file.   Maurice Bell [name]

## 2018-05-13 ENCOUNTER — Telehealth (INDEPENDENT_AMBULATORY_CARE_PROVIDER_SITE_OTHER): Payer: Self-pay

## 2018-05-13 NOTE — Telephone Encounter (Signed)
-----   Message from [name], PA-C sent at 05/12/2018  1:11 PM EDT ----- I have documented and reordered CMP. Please contact patient to return for CMP redraw. Thank you.

## 2018-05-13 NOTE — Telephone Encounter (Signed)
Patient aware of need for redraw will come in on Monday 7/15 at 8:30. [name], CMA

## 2018-05-14 DIAGNOSIS — N186 End stage renal disease: Secondary | ICD-10-CM | POA: Diagnosis not present

## 2018-05-14 DIAGNOSIS — N2581 Secondary hyperparathyroidism of renal origin: Secondary | ICD-10-CM | POA: Diagnosis not present

## 2018-05-14 DIAGNOSIS — D631 Anemia in chronic kidney disease: Secondary | ICD-10-CM | POA: Diagnosis not present

## 2018-05-14 DIAGNOSIS — D509 Iron deficiency anemia, unspecified: Secondary | ICD-10-CM | POA: Diagnosis not present

## 2018-05-16 ENCOUNTER — Other Ambulatory Visit (INDEPENDENT_AMBULATORY_CARE_PROVIDER_SITE_OTHER): Payer: Medicaid Other

## 2018-05-16 DIAGNOSIS — Z0189 Encounter for other specified special examinations: Secondary | ICD-10-CM | POA: Diagnosis not present

## 2018-05-17 LAB — COMPREHENSIVE METABOLIC PANEL
ALK PHOS: 106 IU/L (ref 39–117)
ALT: 51 IU/L — AB (ref 0–44)
AST: 53 IU/L — ABNORMAL HIGH (ref 0–40)
Albumin/Globulin Ratio: 1.4 (ref 1.2–2.2)
Albumin: 4.2 g/dL (ref 3.5–5.5)
BILIRUBIN TOTAL: 0.3 mg/dL (ref 0.0–1.2)
BUN/Creatinine Ratio: 4 — ABNORMAL LOW (ref 9–20)
BUN: 49 mg/dL — AB (ref 6–24)
CHLORIDE: 95 mmol/L — AB (ref 96–106)
CO2: 28 mmol/L (ref 20–29)
Calcium: 9.1 mg/dL (ref 8.7–10.2)
Creatinine, Ser: 11.61 mg/dL — ABNORMAL HIGH (ref 0.76–1.27)
GFR calc Af Amer: 5 mL/min/{1.73_m2} — ABNORMAL LOW (ref >59)
GFR calc non Af Amer: 4 mL/min/{1.73_m2} — ABNORMAL LOW (ref >59)
GLUCOSE: 98 mg/dL (ref 65–99)
Globulin, Total: 2.9 g/dL (ref 1.5–4.5)
POTASSIUM: 4.3 mmol/L (ref 3.5–5.2)
Sodium: 146 mmol/L — ABNORMAL HIGH (ref 134–144)
TOTAL PROTEIN: 7.1 g/dL (ref 6.0–8.5)

## 2018-05-19 DIAGNOSIS — D509 Iron deficiency anemia, unspecified: Secondary | ICD-10-CM | POA: Diagnosis not present

## 2018-05-19 DIAGNOSIS — D631 Anemia in chronic kidney disease: Secondary | ICD-10-CM | POA: Diagnosis not present

## 2018-05-19 DIAGNOSIS — N186 End stage renal disease: Secondary | ICD-10-CM | POA: Diagnosis not present

## 2018-05-19 DIAGNOSIS — N2581 Secondary hyperparathyroidism of renal origin: Secondary | ICD-10-CM | POA: Diagnosis not present

## 2018-05-20 ENCOUNTER — Telehealth (INDEPENDENT_AMBULATORY_CARE_PROVIDER_SITE_OTHER): Payer: Self-pay

## 2018-05-20 NOTE — Telephone Encounter (Signed)
-----   Message from [name], PA-C sent at 05/17/2018  8:43 AM EDT ----- Mild liver inflammation. May likely be attributed to HCV. He needs to complete CAFA before I can refer to ID.

## 2018-05-20 NOTE — Telephone Encounter (Signed)
Left voicemail notifying patient of mild lover inflammation, likely attributed to HCV. Needs to complete CAFA before he can be referred to infectious disease. Asked patient to call our office with any questions or concerns. [name], CMA

## 2018-05-24 DIAGNOSIS — D509 Iron deficiency anemia, unspecified: Secondary | ICD-10-CM | POA: Diagnosis not present

## 2018-05-24 DIAGNOSIS — D631 Anemia in chronic kidney disease: Secondary | ICD-10-CM | POA: Diagnosis not present

## 2018-05-24 DIAGNOSIS — N2581 Secondary hyperparathyroidism of renal origin: Secondary | ICD-10-CM | POA: Diagnosis not present

## 2018-05-24 DIAGNOSIS — N186 End stage renal disease: Secondary | ICD-10-CM | POA: Diagnosis not present

## 2018-05-26 DIAGNOSIS — N186 End stage renal disease: Secondary | ICD-10-CM | POA: Diagnosis not present

## 2018-05-26 DIAGNOSIS — N2581 Secondary hyperparathyroidism of renal origin: Secondary | ICD-10-CM | POA: Diagnosis not present

## 2018-05-26 DIAGNOSIS — D631 Anemia in chronic kidney disease: Secondary | ICD-10-CM | POA: Diagnosis not present

## 2018-05-26 DIAGNOSIS — D509 Iron deficiency anemia, unspecified: Secondary | ICD-10-CM | POA: Diagnosis not present

## 2018-05-28 DIAGNOSIS — N186 End stage renal disease: Secondary | ICD-10-CM | POA: Diagnosis not present

## 2018-05-28 DIAGNOSIS — N2581 Secondary hyperparathyroidism of renal origin: Secondary | ICD-10-CM | POA: Diagnosis not present

## 2018-05-28 DIAGNOSIS — D631 Anemia in chronic kidney disease: Secondary | ICD-10-CM | POA: Diagnosis not present

## 2018-05-28 DIAGNOSIS — D509 Iron deficiency anemia, unspecified: Secondary | ICD-10-CM | POA: Diagnosis not present

## 2018-05-31 DIAGNOSIS — D509 Iron deficiency anemia, unspecified: Secondary | ICD-10-CM | POA: Diagnosis not present

## 2018-05-31 DIAGNOSIS — D631 Anemia in chronic kidney disease: Secondary | ICD-10-CM | POA: Diagnosis not present

## 2018-05-31 DIAGNOSIS — N186 End stage renal disease: Secondary | ICD-10-CM | POA: Diagnosis not present

## 2018-05-31 DIAGNOSIS — N2581 Secondary hyperparathyroidism of renal origin: Secondary | ICD-10-CM | POA: Diagnosis not present

## 2018-06-01 DIAGNOSIS — Z992 Dependence on renal dialysis: Secondary | ICD-10-CM | POA: Diagnosis not present

## 2018-06-01 DIAGNOSIS — N186 End stage renal disease: Secondary | ICD-10-CM | POA: Diagnosis not present

## 2018-06-01 DIAGNOSIS — I129 Hypertensive chronic kidney disease with stage 1 through stage 4 chronic kidney disease, or unspecified chronic kidney disease: Secondary | ICD-10-CM | POA: Diagnosis not present

## 2018-06-02 DIAGNOSIS — D509 Iron deficiency anemia, unspecified: Secondary | ICD-10-CM | POA: Diagnosis not present

## 2018-06-02 DIAGNOSIS — D631 Anemia in chronic kidney disease: Secondary | ICD-10-CM | POA: Diagnosis not present

## 2018-06-02 DIAGNOSIS — N186 End stage renal disease: Secondary | ICD-10-CM | POA: Diagnosis not present

## 2018-06-02 DIAGNOSIS — N2581 Secondary hyperparathyroidism of renal origin: Secondary | ICD-10-CM | POA: Diagnosis not present

## 2018-06-04 DIAGNOSIS — D631 Anemia in chronic kidney disease: Secondary | ICD-10-CM | POA: Diagnosis not present

## 2018-06-04 DIAGNOSIS — D509 Iron deficiency anemia, unspecified: Secondary | ICD-10-CM | POA: Diagnosis not present

## 2018-06-04 DIAGNOSIS — N186 End stage renal disease: Secondary | ICD-10-CM | POA: Diagnosis not present

## 2018-06-04 DIAGNOSIS — N2581 Secondary hyperparathyroidism of renal origin: Secondary | ICD-10-CM | POA: Diagnosis not present

## 2018-06-07 DIAGNOSIS — D631 Anemia in chronic kidney disease: Secondary | ICD-10-CM | POA: Diagnosis not present

## 2018-06-07 DIAGNOSIS — N186 End stage renal disease: Secondary | ICD-10-CM | POA: Diagnosis not present

## 2018-06-07 DIAGNOSIS — N2581 Secondary hyperparathyroidism of renal origin: Secondary | ICD-10-CM | POA: Diagnosis not present

## 2018-06-07 DIAGNOSIS — D509 Iron deficiency anemia, unspecified: Secondary | ICD-10-CM | POA: Diagnosis not present

## 2018-06-11 DIAGNOSIS — N2581 Secondary hyperparathyroidism of renal origin: Secondary | ICD-10-CM | POA: Diagnosis not present

## 2018-06-11 DIAGNOSIS — N186 End stage renal disease: Secondary | ICD-10-CM | POA: Diagnosis not present

## 2018-06-11 DIAGNOSIS — D509 Iron deficiency anemia, unspecified: Secondary | ICD-10-CM | POA: Diagnosis not present

## 2018-06-11 DIAGNOSIS — D631 Anemia in chronic kidney disease: Secondary | ICD-10-CM | POA: Diagnosis not present

## 2018-06-13 ENCOUNTER — Ambulatory Visit (INDEPENDENT_AMBULATORY_CARE_PROVIDER_SITE_OTHER): Payer: Self-pay | Admitting: Physician Assistant

## 2018-06-14 DIAGNOSIS — D631 Anemia in chronic kidney disease: Secondary | ICD-10-CM | POA: Diagnosis not present

## 2018-06-14 DIAGNOSIS — D509 Iron deficiency anemia, unspecified: Secondary | ICD-10-CM | POA: Diagnosis not present

## 2018-06-14 DIAGNOSIS — N2581 Secondary hyperparathyroidism of renal origin: Secondary | ICD-10-CM | POA: Diagnosis not present

## 2018-06-14 DIAGNOSIS — N186 End stage renal disease: Secondary | ICD-10-CM | POA: Diagnosis not present

## 2018-06-16 DIAGNOSIS — N2581 Secondary hyperparathyroidism of renal origin: Secondary | ICD-10-CM | POA: Diagnosis not present

## 2018-06-16 DIAGNOSIS — D631 Anemia in chronic kidney disease: Secondary | ICD-10-CM | POA: Diagnosis not present

## 2018-06-16 DIAGNOSIS — D509 Iron deficiency anemia, unspecified: Secondary | ICD-10-CM | POA: Diagnosis not present

## 2018-06-16 DIAGNOSIS — N186 End stage renal disease: Secondary | ICD-10-CM | POA: Diagnosis not present

## 2018-06-18 DIAGNOSIS — D631 Anemia in chronic kidney disease: Secondary | ICD-10-CM | POA: Diagnosis not present

## 2018-06-18 DIAGNOSIS — D509 Iron deficiency anemia, unspecified: Secondary | ICD-10-CM | POA: Diagnosis not present

## 2018-06-18 DIAGNOSIS — N186 End stage renal disease: Secondary | ICD-10-CM | POA: Diagnosis not present

## 2018-06-18 DIAGNOSIS — N2581 Secondary hyperparathyroidism of renal origin: Secondary | ICD-10-CM | POA: Diagnosis not present

## 2018-06-21 DIAGNOSIS — D509 Iron deficiency anemia, unspecified: Secondary | ICD-10-CM | POA: Diagnosis not present

## 2018-06-21 DIAGNOSIS — N2581 Secondary hyperparathyroidism of renal origin: Secondary | ICD-10-CM | POA: Diagnosis not present

## 2018-06-21 DIAGNOSIS — D631 Anemia in chronic kidney disease: Secondary | ICD-10-CM | POA: Diagnosis not present

## 2018-06-21 DIAGNOSIS — N186 End stage renal disease: Secondary | ICD-10-CM | POA: Diagnosis not present

## 2018-06-23 DIAGNOSIS — N186 End stage renal disease: Secondary | ICD-10-CM | POA: Diagnosis not present

## 2018-06-23 DIAGNOSIS — D631 Anemia in chronic kidney disease: Secondary | ICD-10-CM | POA: Diagnosis not present

## 2018-06-23 DIAGNOSIS — N2581 Secondary hyperparathyroidism of renal origin: Secondary | ICD-10-CM | POA: Diagnosis not present

## 2018-06-23 DIAGNOSIS — D509 Iron deficiency anemia, unspecified: Secondary | ICD-10-CM | POA: Diagnosis not present

## 2018-06-25 DIAGNOSIS — N2581 Secondary hyperparathyroidism of renal origin: Secondary | ICD-10-CM | POA: Diagnosis not present

## 2018-06-25 DIAGNOSIS — N186 End stage renal disease: Secondary | ICD-10-CM | POA: Diagnosis not present

## 2018-06-25 DIAGNOSIS — D631 Anemia in chronic kidney disease: Secondary | ICD-10-CM | POA: Diagnosis not present

## 2018-06-25 DIAGNOSIS — D509 Iron deficiency anemia, unspecified: Secondary | ICD-10-CM | POA: Diagnosis not present

## 2018-06-28 DIAGNOSIS — D631 Anemia in chronic kidney disease: Secondary | ICD-10-CM | POA: Diagnosis not present

## 2018-06-28 DIAGNOSIS — D509 Iron deficiency anemia, unspecified: Secondary | ICD-10-CM | POA: Diagnosis not present

## 2018-06-28 DIAGNOSIS — N186 End stage renal disease: Secondary | ICD-10-CM | POA: Diagnosis not present

## 2018-06-28 DIAGNOSIS — N2581 Secondary hyperparathyroidism of renal origin: Secondary | ICD-10-CM | POA: Diagnosis not present

## 2018-07-02 DIAGNOSIS — D509 Iron deficiency anemia, unspecified: Secondary | ICD-10-CM | POA: Diagnosis not present

## 2018-07-02 DIAGNOSIS — Z992 Dependence on renal dialysis: Secondary | ICD-10-CM | POA: Diagnosis not present

## 2018-07-02 DIAGNOSIS — N186 End stage renal disease: Secondary | ICD-10-CM | POA: Diagnosis not present

## 2018-07-02 DIAGNOSIS — D631 Anemia in chronic kidney disease: Secondary | ICD-10-CM | POA: Diagnosis not present

## 2018-07-02 DIAGNOSIS — I129 Hypertensive chronic kidney disease with stage 1 through stage 4 chronic kidney disease, or unspecified chronic kidney disease: Secondary | ICD-10-CM | POA: Diagnosis not present

## 2018-07-02 DIAGNOSIS — N2581 Secondary hyperparathyroidism of renal origin: Secondary | ICD-10-CM | POA: Diagnosis not present

## 2018-07-03 DIAGNOSIS — Z992 Dependence on renal dialysis: Secondary | ICD-10-CM | POA: Diagnosis not present

## 2018-07-03 DIAGNOSIS — I129 Hypertensive chronic kidney disease with stage 1 through stage 4 chronic kidney disease, or unspecified chronic kidney disease: Secondary | ICD-10-CM | POA: Diagnosis not present

## 2018-07-03 DIAGNOSIS — N186 End stage renal disease: Secondary | ICD-10-CM | POA: Diagnosis not present

## 2018-07-05 DIAGNOSIS — D509 Iron deficiency anemia, unspecified: Secondary | ICD-10-CM | POA: Diagnosis not present

## 2018-07-05 DIAGNOSIS — N2581 Secondary hyperparathyroidism of renal origin: Secondary | ICD-10-CM | POA: Diagnosis not present

## 2018-07-05 DIAGNOSIS — D631 Anemia in chronic kidney disease: Secondary | ICD-10-CM | POA: Diagnosis not present

## 2018-07-05 DIAGNOSIS — N186 End stage renal disease: Secondary | ICD-10-CM | POA: Diagnosis not present

## 2018-07-05 DIAGNOSIS — E875 Hyperkalemia: Secondary | ICD-10-CM | POA: Diagnosis not present

## 2018-07-08 ENCOUNTER — Encounter (HOSPITAL_COMMUNITY): Payer: Self-pay

## 2018-07-08 ENCOUNTER — Emergency Department (HOSPITAL_COMMUNITY): Payer: Medicare Other

## 2018-07-08 ENCOUNTER — Other Ambulatory Visit: Payer: Self-pay

## 2018-07-08 ENCOUNTER — Observation Stay (HOSPITAL_COMMUNITY)
Admission: EM | Admit: 2018-07-08 | Discharge: 2018-07-09 | Disposition: A | Discharge status: Home / Self Care | Payer: Medicare Other | Attending: Internal Medicine | Admitting: Internal Medicine

## 2018-07-08 DIAGNOSIS — G8929 Other chronic pain: Secondary | ICD-10-CM | POA: Diagnosis not present

## 2018-07-08 DIAGNOSIS — N2581 Secondary hyperparathyroidism of renal origin: Secondary | ICD-10-CM | POA: Diagnosis not present

## 2018-07-08 DIAGNOSIS — E875 Hyperkalemia: Secondary | ICD-10-CM | POA: Diagnosis not present

## 2018-07-08 DIAGNOSIS — X58XXXA Exposure to other specified factors, initial encounter: Secondary | ICD-10-CM | POA: Insufficient documentation

## 2018-07-08 DIAGNOSIS — R079 Chest pain, unspecified: Secondary | ICD-10-CM | POA: Diagnosis not present

## 2018-07-08 DIAGNOSIS — M546 Pain in thoracic spine: Secondary | ICD-10-CM | POA: Diagnosis not present

## 2018-07-08 DIAGNOSIS — N186 End stage renal disease: Secondary | ICD-10-CM | POA: Diagnosis present

## 2018-07-08 DIAGNOSIS — D631 Anemia in chronic kidney disease: Secondary | ICD-10-CM | POA: Insufficient documentation

## 2018-07-08 DIAGNOSIS — Z87891 Personal history of nicotine dependence: Secondary | ICD-10-CM | POA: Insufficient documentation

## 2018-07-08 DIAGNOSIS — M549 Dorsalgia, unspecified: Secondary | ICD-10-CM | POA: Diagnosis present

## 2018-07-08 DIAGNOSIS — I4510 Unspecified right bundle-branch block: Secondary | ICD-10-CM | POA: Diagnosis not present

## 2018-07-08 DIAGNOSIS — Z8673 Personal history of transient ischemic attack (TIA), and cerebral infarction without residual deficits: Secondary | ICD-10-CM | POA: Diagnosis not present

## 2018-07-08 DIAGNOSIS — B1920 Unspecified viral hepatitis C without hepatic coma: Secondary | ICD-10-CM | POA: Diagnosis not present

## 2018-07-08 DIAGNOSIS — E8889 Other specified metabolic disorders: Secondary | ICD-10-CM | POA: Insufficient documentation

## 2018-07-08 DIAGNOSIS — Z79899 Other long term (current) drug therapy: Secondary | ICD-10-CM | POA: Diagnosis not present

## 2018-07-08 DIAGNOSIS — I10 Essential (primary) hypertension: Secondary | ICD-10-CM | POA: Diagnosis present

## 2018-07-08 DIAGNOSIS — S0501XA Injury of conjunctiva and corneal abrasion without foreign body, right eye, initial encounter: Secondary | ICD-10-CM

## 2018-07-08 DIAGNOSIS — Z992 Dependence on renal dialysis: Secondary | ICD-10-CM | POA: Insufficient documentation

## 2018-07-08 DIAGNOSIS — I120 Hypertensive chronic kidney disease with stage 5 chronic kidney disease or end stage renal disease: Secondary | ICD-10-CM | POA: Insufficient documentation

## 2018-07-08 LAB — CBC WITH DIFFERENTIAL/PLATELET
Basophils Absolute: 0 10*3/uL (ref 0.0–0.1)
Basophils Relative: 1 %
EOS ABS: 0.2 10*3/uL (ref 0.0–0.7)
Eosinophils Relative: 6 %
HCT: 36 % — ABNORMAL LOW (ref 39.0–52.0)
HEMOGLOBIN: 11.3 g/dL — AB (ref 13.0–17.0)
LYMPHS ABS: 1 10*3/uL (ref 0.7–4.0)
Lymphocytes Relative: 29 %
MCH: 36.2 pg — AB (ref 26.0–34.0)
MCHC: 31.4 g/dL (ref 30.0–36.0)
MCV: 115.4 fL — ABNORMAL HIGH (ref 78.0–100.0)
Monocytes Absolute: 0.5 10*3/uL (ref 0.1–1.0)
Monocytes Relative: 14 %
NEUTROS ABS: 1.6 10*3/uL — AB (ref 1.7–7.7)
Neutrophils Relative %: 50 %
Platelets: 131 10*3/uL — ABNORMAL LOW (ref 150–400)
RBC: 3.12 MIL/uL — AB (ref 4.22–5.81)
RDW: 14.7 % (ref 11.5–15.5)
WBC: 3.3 10*3/uL — AB (ref 4.0–10.5)

## 2018-07-08 LAB — I-STAT CHEM 8, ED
BUN: 90 mg/dL — ABNORMAL HIGH (ref 6–20)
Calcium, Ion: 0.82 mmol/L — CL (ref 1.15–1.40)
Chloride: 105 mmol/L (ref 98–111)
Creatinine, Ser: 17.4 mg/dL — ABNORMAL HIGH (ref 0.61–1.24)
Glucose, Bld: 74 mg/dL (ref 70–99)
HCT: 34 % — ABNORMAL LOW (ref 39.0–52.0)
Hemoglobin: 11.6 g/dL — ABNORMAL LOW (ref 13.0–17.0)
Potassium: 8.5 mmol/L (ref 3.5–5.1)
Sodium: 138 mmol/L (ref 135–145)
TCO2: 26 mmol/L (ref 22–32)

## 2018-07-08 LAB — BASIC METABOLIC PANEL
ANION GAP: 20 — AB (ref 5–15)
BUN: 86 mg/dL — ABNORMAL HIGH (ref 6–20)
CALCIUM: 7.5 mg/dL — AB (ref 8.9–10.3)
CO2: 24 mmol/L (ref 22–32)
Chloride: 99 mmol/L (ref 98–111)
Creatinine, Ser: 16.63 mg/dL — ABNORMAL HIGH (ref 0.61–1.24)
GFR calc Af Amer: 3 mL/min — ABNORMAL LOW (ref >60)
GFR, EST NON AFRICAN AMERICAN: 3 mL/min — AB (ref >60)
GLUCOSE: 80 mg/dL (ref 70–99)
Potassium: >7.5 mmol/L (ref 3.5–5.1)
SODIUM: 143 mmol/L (ref 135–145)

## 2018-07-08 LAB — I-STAT TROPONIN, ED: Troponin i, poc: 0.07 ng/mL (ref 0.00–0.08)

## 2018-07-08 MED ORDER — SODIUM POLYSTYRENE SULFONATE 15 GM/60ML PO SUSP
30.0000 g | Freq: Once | ORAL | Status: AC
  Administered 2018-07-08: 30 g via ORAL
  Filled 2018-07-08: qty 120

## 2018-07-08 MED ORDER — LOSARTAN POTASSIUM 50 MG PO TABS
50.0000 mg | ORAL_TABLET | Freq: Every day | ORAL | Status: DC
  Administered 2018-07-08: 50 mg via ORAL
  Filled 2018-07-08: qty 1

## 2018-07-08 MED ORDER — HYDRALAZINE HCL 20 MG/ML IJ SOLN
5.0000 mg | Freq: Once | INTRAMUSCULAR | Status: AC
  Administered 2018-07-08: 5 mg via INTRAVENOUS

## 2018-07-08 MED ORDER — CHLORHEXIDINE GLUCONATE CLOTH 2 % EX PADS: 6.0000 | MEDICATED_PAD | Freq: Every day | CUTANEOUS | Status: DC

## 2018-07-08 MED ORDER — HYDRALAZINE HCL 20 MG/ML IJ SOLN: 5.0000 mg | INTRAMUSCULAR | Status: DC | PRN

## 2018-07-08 MED ORDER — FAMOTIDINE 20 MG PO TABS
40.0000 mg | ORAL_TABLET | Freq: Every day | ORAL | Status: DC
  Administered 2018-07-09: 40 mg via ORAL
  Filled 2018-07-08: qty 2

## 2018-07-08 MED ORDER — HEPARIN SODIUM (PORCINE) 5000 UNIT/ML IJ SOLN
5000.0000 [IU] | Freq: Three times a day (TID) | INTRAMUSCULAR | Status: DC
  Filled 2018-07-08: qty 1

## 2018-07-08 MED ORDER — HYDROMORPHONE HCL 2 MG PO TABS
1.0000 mg | ORAL_TABLET | Freq: Once | ORAL | Status: AC
  Administered 2018-07-09: 1 mg via ORAL
  Filled 2018-07-08: qty 1

## 2018-07-08 MED ORDER — SODIUM CHLORIDE 0.9 % IV SOLN: 100.0000 mL | INTRAVENOUS | Status: DC | PRN

## 2018-07-08 MED ORDER — CLONIDINE HCL 0.1 MG PO TABS
0.1000 mg | ORAL_TABLET | Freq: Two times a day (BID) | ORAL | Status: DC
  Administered 2018-07-08 – 2018-07-09 (×2): 0.1 mg via ORAL
  Filled 2018-07-08 (×2): qty 1

## 2018-07-08 MED ORDER — POLYMYXIN B-TRIMETHOPRIM 10000-0.1 UNIT/ML-% OP SOLN
2.0000 [drp] | Freq: Two times a day (BID) | OPHTHALMIC | Status: DC
  Administered 2018-07-09: 2 [drp] via OPHTHALMIC
  Filled 2018-07-08: qty 10

## 2018-07-08 MED ORDER — SODIUM BICARBONATE 8.4 % IV SOLN
50.0000 meq | Freq: Once | INTRAVENOUS | Status: AC
  Administered 2018-07-08: 50 meq via INTRAVENOUS
  Filled 2018-07-08: qty 50

## 2018-07-08 MED ORDER — SEVELAMER CARBONATE 800 MG PO TABS
3200.0000 mg | ORAL_TABLET | Freq: Three times a day (TID) | ORAL | Status: DC
  Administered 2018-07-09: 3200 mg via ORAL
  Filled 2018-07-08: qty 4

## 2018-07-08 MED ORDER — METOPROLOL TARTRATE 50 MG PO TABS
150.0000 mg | ORAL_TABLET | Freq: Two times a day (BID) | ORAL | Status: DC
  Administered 2018-07-08 – 2018-07-09 (×2): 150 mg via ORAL
  Filled 2018-07-08 (×2): qty 3

## 2018-07-08 MED ORDER — POLYMYXIN B-TRIMETHOPRIM 10000-0.1 UNIT/ML-% OP SOLN: 2.0000 [drp] | Freq: Four times a day (QID) | OPHTHALMIC | Status: DC

## 2018-07-08 MED ORDER — LIDOCAINE-PRILOCAINE 2.5-2.5 % EX CREA
1.0000 "application " | TOPICAL_CREAM | CUTANEOUS | Status: DC | PRN
  Filled 2018-07-08: qty 5

## 2018-07-08 MED ORDER — DEXTROSE 50 % IV SOLN
1.0000 | Freq: Once | INTRAVENOUS | Status: AC
  Administered 2018-07-08: 50 mL via INTRAVENOUS
  Filled 2018-07-08: qty 50

## 2018-07-08 MED ORDER — ACETAMINOPHEN 500 MG PO TABS
ORAL_TABLET | ORAL | Status: AC
  Administered 2018-07-08: 500 mg via ORAL
  Filled 2018-07-08: qty 1

## 2018-07-08 MED ORDER — ACETAMINOPHEN 500 MG PO TABS
500.0000 mg | ORAL_TABLET | Freq: Three times a day (TID) | ORAL | Status: DC | PRN
  Administered 2018-07-08 – 2018-07-09 (×2): 500 mg via ORAL
  Filled 2018-07-08: qty 1

## 2018-07-08 MED ORDER — ALTEPLASE 2 MG IJ SOLR: 2.0000 mg | Freq: Once | INTRAMUSCULAR | Status: DC | PRN

## 2018-07-08 MED ORDER — DIPHENHYDRAMINE HCL 25 MG PO CAPS: 50.0000 mg | ORAL_CAPSULE | Freq: Two times a day (BID) | ORAL | Status: DC | PRN

## 2018-07-08 MED ORDER — PENTAFLUOROPROP-TETRAFLUOROETH EX AERO
1.0000 "application " | INHALATION_SPRAY | CUTANEOUS | Status: DC | PRN
  Filled 2018-07-08: qty 103.5

## 2018-07-08 MED ORDER — HEPARIN SODIUM (PORCINE) 1000 UNIT/ML DIALYSIS
1000.0000 [IU] | INTRAMUSCULAR | Status: DC | PRN
  Filled 2018-07-08: qty 1

## 2018-07-08 MED ORDER — ONDANSETRON HCL 4 MG/2ML IJ SOLN
4.0000 mg | Freq: Once | INTRAMUSCULAR | Status: AC
  Administered 2018-07-08: 4 mg via INTRAVENOUS
  Filled 2018-07-08: qty 2

## 2018-07-08 MED ORDER — INSULIN ASPART 100 UNIT/ML IV SOLN
5.0000 [IU] | Freq: Once | INTRAVENOUS | Status: AC
  Administered 2018-07-08: 5 [IU] via INTRAVENOUS
  Filled 2018-07-08: qty 0.05

## 2018-07-08 MED ORDER — FLUORESCEIN SODIUM 1 MG OP STRP
1.0000 | ORAL_STRIP | Freq: Once | OPHTHALMIC | Status: AC
  Administered 2018-07-08: 1 via OPHTHALMIC
  Filled 2018-07-08: qty 1

## 2018-07-08 MED ORDER — TETRACAINE HCL 0.5 % OP SOLN
2.0000 [drp] | Freq: Once | OPHTHALMIC | Status: AC
  Administered 2018-07-08: 2 [drp] via OPHTHALMIC
  Filled 2018-07-08: qty 4

## 2018-07-08 MED ORDER — LIDOCAINE HCL (PF) 1 % IJ SOLN: 5.0000 mL | INTRAMUSCULAR | Status: DC | PRN

## 2018-07-08 MED ORDER — SODIUM CHLORIDE 0.9 % IV SOLN
1.0000 g | Freq: Once | INTRAVENOUS | Status: AC
  Administered 2018-07-08: 1 g via INTRAVENOUS
  Filled 2018-07-08: qty 10

## 2018-07-08 MED ORDER — AMLODIPINE BESYLATE 10 MG PO TABS
10.0000 mg | ORAL_TABLET | Freq: Every day | ORAL | Status: DC
  Administered 2018-07-08: 10 mg via ORAL
  Filled 2018-07-08: qty 1

## 2018-07-08 MED ORDER — HYDRALAZINE HCL 20 MG/ML IJ SOLN
INTRAMUSCULAR | Status: AC
  Administered 2018-07-08: 5 mg via INTRAVENOUS
  Filled 2018-07-08: qty 1

## 2018-07-08 NOTE — ED Notes (Signed)
Admitting at bedside 

## 2018-07-08 NOTE — Progress Notes (Signed)
HD tx initiated via 15Gx2 w/o problem by DAin, RN, pull/push/flush well w/o problem, VSS w/ very high bp, will cont to monitor while on HD tx, if bp doesn't start to lower w/ HD w/in 30 -45 min of HD tx starting, will page admitting MD for prn bp meds if appropriate

## 2018-07-08 NOTE — ED Notes (Signed)
Patient transported to X-ray 

## 2018-07-08 NOTE — ED Notes (Signed)
ED Provider at bedside. 

## 2018-07-08 NOTE — Progress Notes (Signed)
HD tx completed @ 2211 w/o problem, UF goal met, blood rinsed back, VSS w/high bp, report called to Sima Matas, RN
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
## 2018-07-08 NOTE — H&P (Signed)
Date: 07/08/2018               Patient Name:  Maurice Bell MRN: [id number]  DOB: 1956-04-04 Age / Sex: 62 y.o., male   PCP: [name], PA-C         Medical Service: Internal Medicine Teaching Service         Attending Physician: Dr. Sid Falcon, MD    First Contact: Dr. Sharon Seller Pager: [phone]  Second Contact: Dr. Berline Lopes Pager: [phone]       After Hours (After 5p/  First Contact Pager: [phone]  weekends / holidays): Second Contact Pager: [phone]   Chief Complaint: upper back pain   History of Present Illness: Mr. Rodriquez is a 62yo male with history of ESRD on HD TThSa, arthritis, HTN, HCV, and CVA who presented to the ED for chronic upper back pain that has been progressively worse over the past month causing him to miss his HD yesterday. He was found to have hyperkalemia in the ED today with EKG changes. Per the ED chart patient reported he was nauseous and weak and knew he had high potassium. He was given IV Ca gluconate, insulin w/dextrose, kayexalate and bicarb. Nephrology was consulted, and he is being taken to emergent dialysis. On seeing patient in the ED he stated he was feeling much better after having received medications for hyperkalemia. He admits to having had his first watermelon in years and "not being able to stop eating it after starting". His nausea and weakness have resolved.   He initially came to the ED because of his chronic upper back pain due to arthritis was worse than usual. He sees NP Domenica Fail with [facility] and had an appointment for x-rays but decided he would come to the ED early to get x-rays. He states his upper back pain is also currently controlled with heating pads.  He also stated he felt that he had something in his eye. Fluorescein stain in the ED showed corneal abrasion over the pupil of the right eye but no foreign body was visualized. He received   Meds:  No outpatient medications have been marked as  taking for the 07/08/18 encounter [facility] Encounter).     Allergies: Allergies as of 07/08/2018  . (No Known Allergies)   Past Medical History:  Diagnosis Date  . Anxiety   . Hepatitis    HEP C  . Hypertension   . Renal disorder    ESRD  . Stroke [facility])    bilat leg weakness residual    Family History: no pertinent family history   Social History: Patient denies smoking, alcohol use, and recreational drug use.   Review of Systems: A complete ROS was negative except as per HPI.   Physical Exam: Blood pressure (!) 176/84, pulse 97, temperature 97.8 F (36.6 C), temperature source Oral, resp. rate 14, weight 65.4 kg, SpO2 99 %.  Constitution: NAD, sitting up in bed, appears stated age, pleasant HEENT: Negative for injection, EOM intact, PERRLA Cardio: regular rate & rhythm, no murmurs, rubs, gallops, left AVF pulsatile, no thrill felt Respiratory: clear to auscultation, no wheezing, rales, rhonchi Abdominal: +bs, NTTP, non-distended, soft MSK: no edema, strength symmetrical Neuro: A&Ox3, pleasant, cooperative Skin: right forarm with previous AVF, otherwise c/d/i   EKG: personally reviewed my interpretation is right bundle branch block, 1st degree AV block, LAD  CXR: personally reviewed my interpretation is no acute abnormalities.   Assessment & Plan by Problem: Principal Problem:  Hyperkalemia Active Problems:   ESRD (end stage renal disease) ([facility])   HTN (hypertension)  Hyperkalemia with ESRD on HD TThSa: Missed dialysis yesterday, last HD three days ago. K+ 8.5 with EKG changes. Troponin .07 but likely chronically elevated as ESRD. Given Ca gluconate, IV bicarb, insulin w/dextrose, and kayexalate. Asymptomatic on visit in the ED. He will get HD tonight and likely tomorrow. IR consulted for graft site without thrill but pulsatile.   - nephrology consulted, currently getting HD - am EKG, EKG prn for chest pain  - am CMP, Mag, Phos  - cont. sevelamer  - benadryl  prn  Arthritic Back Pain: Patient initially came to ED for chronic upper back pain that has been getting worse the past month. He had an appointment for chest xray but decided to come to the ED early for increased pain. Pain is currently controlled with heating pads.   - cont. Home acetaminophen   HTN: Patient's blood pressure 200/100 during admission. He did not take his bp medications this morning. Likely high due to missed dialysis as well   - therapeutic HD - hydralazine prn during HD then we will restart his home medications as regularly scheduled  - cont. Home meds: norvasc 10mg  qd, clonidine .1mg  bid, metoprolol tartrate 50mg  150mg  bid, losartan 50mg  qhs  Right Corneal Abrasion: small right corneal abrasion over pupil seen on fluorescein stain in ED. He was given tetracaine & trimethoprim-polymyxin eyedrops were started   - cont. abx eye drops  Dispo: Admit patient to Observation with expected length of stay less than 2 midnights.  SignedMarty Heck, DO 07/08/2018, 7:45 PM  Pager: [phone]

## 2018-07-08 NOTE — ED Triage Notes (Addendum)
Pt presents with 1 month h/o back pain.  Pt reports h/o sciatica but reports this pain is different.  Pt denies any injury; pt denies any bowel or bladder incontinence;  Pt reports HD on Tuesday, Thursday and Saturday; didn't go yesterday due to pain. Pt also reports FB in R eye today, denies any vision change.
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
## 2018-07-08 NOTE — ED Notes (Signed)
Nephrology at bedside

## 2018-07-08 NOTE — ED Notes (Signed)
Pt ambulated to the bathroom w/o difficulty 

## 2018-07-09 DIAGNOSIS — Z992 Dependence on renal dialysis: Secondary | ICD-10-CM

## 2018-07-09 DIAGNOSIS — I120 Hypertensive chronic kidney disease with stage 5 chronic kidney disease or end stage renal disease: Secondary | ICD-10-CM

## 2018-07-09 DIAGNOSIS — G8929 Other chronic pain: Secondary | ICD-10-CM

## 2018-07-09 DIAGNOSIS — E875 Hyperkalemia: Principal | ICD-10-CM

## 2018-07-09 DIAGNOSIS — B1920 Unspecified viral hepatitis C without hepatic coma: Secondary | ICD-10-CM | POA: Diagnosis not present

## 2018-07-09 DIAGNOSIS — M1990 Unspecified osteoarthritis, unspecified site: Secondary | ICD-10-CM | POA: Diagnosis not present

## 2018-07-09 DIAGNOSIS — N186 End stage renal disease: Secondary | ICD-10-CM | POA: Diagnosis not present

## 2018-07-09 DIAGNOSIS — Z8673 Personal history of transient ischemic attack (TIA), and cerebral infarction without residual deficits: Secondary | ICD-10-CM

## 2018-07-09 DIAGNOSIS — Z79899 Other long term (current) drug therapy: Secondary | ICD-10-CM

## 2018-07-09 DIAGNOSIS — I440 Atrioventricular block, first degree: Secondary | ICD-10-CM

## 2018-07-09 DIAGNOSIS — S0501XA Injury of conjunctiva and corneal abrasion without foreign body, right eye, initial encounter: Secondary | ICD-10-CM

## 2018-07-09 DIAGNOSIS — N2581 Secondary hyperparathyroidism of renal origin: Secondary | ICD-10-CM | POA: Diagnosis not present

## 2018-07-09 DIAGNOSIS — X58XXXA Exposure to other specified factors, initial encounter: Secondary | ICD-10-CM

## 2018-07-09 DIAGNOSIS — Z9115 Patient's noncompliance with renal dialysis: Secondary | ICD-10-CM

## 2018-07-09 DIAGNOSIS — I4510 Unspecified right bundle-branch block: Secondary | ICD-10-CM

## 2018-07-09 DIAGNOSIS — D631 Anemia in chronic kidney disease: Secondary | ICD-10-CM | POA: Diagnosis not present

## 2018-07-09 DIAGNOSIS — D509 Iron deficiency anemia, unspecified: Secondary | ICD-10-CM | POA: Diagnosis not present

## 2018-07-09 DIAGNOSIS — M549 Dorsalgia, unspecified: Secondary | ICD-10-CM

## 2018-07-09 LAB — GLUCOSE, CAPILLARY: GLUCOSE-CAPILLARY: 85 mg/dL (ref 70–99)

## 2018-07-09 LAB — COMPREHENSIVE METABOLIC PANEL
ALT: 32 U/L (ref 0–44)
ANION GAP: 17 — AB (ref 5–15)
AST: 44 U/L — ABNORMAL HIGH (ref 15–41)
Albumin: 3.5 g/dL (ref 3.5–5.0)
Alkaline Phosphatase: 79 U/L (ref 38–126)
BUN: 26 mg/dL — AB (ref 6–20)
CALCIUM: 8.2 mg/dL — AB (ref 8.9–10.3)
CO2: 30 mmol/L (ref 22–32)
CREATININE: 8.11 mg/dL — AB (ref 0.61–1.24)
Chloride: 94 mmol/L — ABNORMAL LOW (ref 98–111)
GFR calc non Af Amer: 6 mL/min — ABNORMAL LOW (ref >60)
GFR, EST AFRICAN AMERICAN: 7 mL/min — AB (ref >60)
Glucose, Bld: 101 mg/dL — ABNORMAL HIGH (ref 70–99)
POTASSIUM: 3.9 mmol/L (ref 3.5–5.1)
SODIUM: 141 mmol/L (ref 135–145)
TOTAL PROTEIN: 7.4 g/dL (ref 6.5–8.1)
Total Bilirubin: 0.5 mg/dL (ref 0.3–1.2)

## 2018-07-09 LAB — MAGNESIUM: MAGNESIUM: 2 mg/dL (ref 1.7–2.4)

## 2018-07-09 LAB — PHOSPHORUS: PHOSPHORUS: 6.6 mg/dL — AB (ref 2.5–4.6)

## 2018-07-09 LAB — MRSA PCR SCREENING: MRSA by PCR: NEGATIVE

## 2018-07-09 MED ORDER — POLYMYXIN B-TRIMETHOPRIM 10000-0.1 UNIT/ML-% OP SOLN: 1.0000 [drp] | Freq: Two times a day (BID) | OPHTHALMIC | 0 refills | Status: DC

## 2018-07-09 MED ORDER — CHLORHEXIDINE GLUCONATE CLOTH 2 % EX PADS
6.0000 | MEDICATED_PAD | Freq: Every day | CUTANEOUS | Status: DC
  Administered 2018-07-09: 6 via TOPICAL

## 2018-07-09 MED ORDER — OXYCODONE-ACETAMINOPHEN 5-325 MG PO TABS: 1.0000 | ORAL_TABLET | Freq: Four times a day (QID) | ORAL | 0 refills | Status: DC | PRN

## 2018-07-09 NOTE — Progress Notes (Addendum)
[facility] Progress Note   Subjective:  Seen in room. S/p HD overnight - repeat K this morning is 3.9, much improved so unlikely to need emergent fistula evaluation. No further CP or dyspnea. Still with some mild back pain which was original complaint.   Objective Vitals:   07/08/18 2211 07/08/18 2232 07/08/18 2300 07/09/18 0530  BP: (!) 158/97 (!) 186/96 (!) 175/109 (!) 157/90  Pulse: 95 90 89 69  Resp: 17 16 18 17   Temp:  97.9 F (36.6 C) 98.1 F (36.7 C) 98.8 F (37.1 C)  TempSrc:  Oral Oral   SpO2: 100% 97% 91% 99%  Weight:  62.4 kg     Physical Exam General: Well appearing, NAD Heart: RRR; no murmur Lungs: CTAB Extremities: No LE edema Dialysis Access:  L AVF + pulsatile  Additional Objective Labs: Basic Metabolic Panel: Recent Labs  Lab 07/08/18 1311 07/08/18 1605 07/09/18 0042  NA 143 138 141  K >7.5* 8.5* 3.9  CL 99 105 94*  CO2 24  --  30  GLUCOSE 80 74 101*  BUN 86* 90* 26*  CREATININE 16.63* 17.40* 8.11*  CALCIUM 7.5*  --  8.2*  PHOS  --   --  6.6*   Liver Function Tests: Recent Labs  Lab 07/09/18 0042  AST 44*  ALT 32  ALKPHOS 79  BILITOT 0.5  PROT 7.4  ALBUMIN 3.5   CBC: Recent Labs  Lab 07/08/18 1543 07/08/18 1605  WBC 3.3*  --   NEUTROABS 1.6*  --   HGB 11.3* 11.6*  HCT 36.0* 34.0*  MCV 115.4*  --   PLT 131*  --    Studies/Results: Dg Chest 2 View  Result Date: 07/08/2018 CLINICAL DATA:  Chest and upper back pain for 2+ weeks, pain goes into shoulders as well - today blood work showed potassium was high - pt is on dialysis, hx of htn, stroke, hepatitis, renal issues EXAM: CHEST - 2 VIEW COMPARISON:  03/31/2018 FINDINGS: Heart size is normal, accentuated by AP position. The lungs are free of focal consolidations and pleural effusions. No pulmonary edema. Remote superior endplate fracture of L1. IMPRESSION: No evidence for acute cardiopulmonary abnormality. Electronically Signed   By: Nolon Nations M.D.   On:  07/08/2018 15:48   Medications: . sodium chloride    . sodium chloride     . amLODipine  10 mg Oral QHS  . Chlorhexidine Gluconate Cloth  6 each Topical Q0600  . cloNIDine  0.1 mg Oral BID  . famotidine  40 mg Oral Daily  . heparin  5,000 Units Subcutaneous Q8H  . losartan  50 mg Oral QHS  . metoprolol tartrate  150 mg Oral BID  . sevelamer carbonate  3,200 mg Oral TID WC  . trimethoprim-polymyxin b  2 drop Right Eye BID    Dialysis Orders: TTS at [facility] 3:45, 400/800, EDW 62kg, 2K/2Ca, AVF - Aranesp 71mcg IV weekly - Venofer 100mg  x 10 ordered - Calcitriol 1.52mcg PO q HD  Assessment/Plan: 1.  Severe hyperkalemia (K 8.5): S/p Bicarb, D50/insulin, CaGluc, & emergent HD 9/6. K much improved. Likely does still have some AVF dysfunction, but will plan to sched [place] for d/c this morning so he can make it to his usual HD session at 12:45p. D/w patient & hospitalist who are in agreement. 2.  ESRD: Usually TTS schedule. Plan, as above. 3.  Hypertension/volume: BP improved with HD. 4.  Anemia: Hgb 11.6; no ESA for now. 5.  Metabolic  bone disease: Ca ok. 6.  Hep C  Veneta Penton, PA-C 07/09/2018, 8:17 AM  [facility] Pager: [phone]  Pt seen, examined and agree w A/P as above.  Kelly Splinter MD [employer] pager [phone]   07/09/2018, 2:06 PM

## 2018-07-09 NOTE — Progress Notes (Signed)
Discharged instructions and papers given to patient.Emphasis given to patient that he must go to his outpatient schedule hemodialysis today.Eye gtts, medicine given to patient.Discharge on stable condition.All questions were answered with patient satisfaction upon discharged.

## 2018-07-09 NOTE — Discharge Summary (Signed)
Name: Evander Macaraeg MRN: [id number] DOB: 06-07-1956 62 y.o. PCP: Tawny Asal  Date of Admission: 07/08/2018 12:48 PM Date of Discharge: 07/09/2018 Attending Physician: Sid Falcon, MD  Discharge Diagnosis: 1. Hyperkalemia 2. ESRD on HD 3. Right Corneal Abrasion  4. Acute on Chronic Upper Back Pain   Discharge Medications: Allergies as of 07/09/2018   No Known Allergies     Medication List    TAKE these medications   acetaminophen 500 MG tablet Commonly known as:  TYLENOL Take 1 tablet (500 mg total) by mouth every 8 (eight) hours as needed.   amLODipine 10 MG tablet Commonly known as:  NORVASC Take 1 tablet (10 mg total) by mouth at bedtime.   B-complex with vitamin C tablet Take 1 tablet by mouth daily.   calcium carbonate 500 MG chewable tablet Commonly known as:  TUMS - dosed in mg elemental calcium Chew 2 tablets (400 mg of elemental calcium total) by mouth 3 (three) times daily with meals.   cloNIDine 0.1 MG tablet Commonly known as:  CATAPRES Take 1 tablet (0.1 mg total) by mouth 2 (two) times daily.   diphenhydrAMINE 50 MG capsule Commonly known as:  BENADRYL Take 1 capsule (50 mg total) by mouth every 12 (twelve) hours as needed for itching.   losartan 50 MG tablet Commonly known as:  COZAAR Take 1 tablet (50 mg total) by mouth at bedtime.   metoprolol tartrate 50 MG tablet Commonly known as:  LOPRESSOR Take 3 tablets (150 mg total) by mouth 2 (two) times daily.   oxyCODONE-acetaminophen 5-325 MG tablet Commonly known as:  PERCOCET/ROXICET Take 1 tablet by mouth every 6 (six) hours as needed for severe pain (right eye pain).   ranitidine 300 MG tablet Commonly known as:  ZANTAC Take 1 tablet (300 mg total) by mouth at bedtime.   sevelamer carbonate 800 MG tablet Commonly known as:  RENVELA Take 4 tablets (3,200 mg total) by mouth 3 (three) times daily with meals.   trimethoprim-polymyxin b ophthalmic solution Commonly known  as:  POLYTRIM Place 1 drop into the right eye 2 (two) times daily.   valACYclovir 500 MG tablet Commonly known as:  VALTREX Take 1 tablet (500 mg total) by mouth daily.       Disposition and follow-up:   Mr.Jahir Ambrosio was discharged from [facility] in Stable condition.  At the hospital follow up visit please address:  1.  ESRD on HD TThSa: Discharged this morning so he could attend dialysis this afternoon after emergent HD for HyperK last night. Pt will have scheduled IR appointment for fistulogram. Per nephrology.  Right Corneal Abrasion: seen in ED, small corneal abrasion over right pupil. Pt discharged with tmp-polymyxin and oxycodone-tylenol 5-350mg  prn for eye pain.  Acute on Chronic Upper Back Pain: Patient came to hospital to try to get xrays which are scheduled with PCP on 9/25. Patient found heating pad really helped his pain.   2.  Labs / imaging needed at time of follow-up: BMP  3.  Pending labs/ test needing follow-up: none   Follow-up Appointments:   Will follow-up outpatient for fistulogram.   Hospital Course by problem list: Hyperkalemia secondary to Missed HD Mr. Flenner is a 62yo male with PMH ESRD on HD TThSa, arthritis, HTN, HCV, and CVA who presented to the ED for chronic upper back pain that has been progressively worse over the past month causing him to miss his HD Thursday. Patient was found to have  hyperkalemia with symptoms and EKG changes. Nephrology was consulted and he went for emergent hemodialysis and received medical treatment for his hyperkalemia, including Ca gluconate. Increased K+ resolved overnight with improvement of symptoms. There is some concern for difficulty with his left AVF although HD was successful last night. He will follow-up outpatient for fistulogram and was discharged this am to attend afternoon dialysis.  Corneal Abrasion He states his eyes started hurting earlier in the morning before admission without  notable trauma or injury. Fluorescein stain in the ED showed small corneal abrasion over the right pupil without foreign body. He was treated and discharged with tmp-polymyxin antibiotics and oxy 5mg  prn for eye pain.  Acute on Chronic Upper Back Pain  He initially came to the ED to get x-rays as his upper back pain secondary to his arthritis per patient has been increasingly painful this past month. He has appointment with PCP later this month but felt he could not wait. Back pain was treated with home tylenol and heating pads, and patient stated pain was well controlled with heating pads.    Discharge Vitals:   BP (!) 157/90 (BP Location: Right Arm)   Pulse 69   Temp 98.8 F (37.1 C)   Resp 17   Wt 62.4 kg Comment: standing  SpO2 99%   BMI 23.61 kg/m   Pertinent Labs, Studies, and Procedures:   EKG in ED: Sinus rhythm with 1st degree A-V block, Left axis deviation, new right bundle branch block,   EKG 9/08: left axis deviation, Normal sinus rhythm, prolonged QT   Potassium: 8.5 >> 3.9   Discharge Instructions: Discharge Instructions    Diet - low sodium heart healthy   Complete by:  As directed    Discharge instructions   Complete by:  As directed    You were hospitalized for hyperkalemia (high potassium). Thank you for allowing [place] to be part of your care.   Please follow-up with opthamology or your primary care physician in one week to make sure your right eye is healing from the abrasion. Please make sure to attend dialysis today, September 7th.   Please note these changes made to your medications:   Percocet 5-325mg  - please take one every six hours as needed for eye pain  Trimethoprim-polymyxin b - use two drops two times per day for at least five days or until the bottle is empty.   Increase activity slowly   Complete by:  As directed       Signed: Molli Hazard A, DO 07/09/2018, 9:20 AM   Pager: [phone]

## 2018-07-09 NOTE — Progress Notes (Signed)
  Date: 07/09/2018  [name]  Medical record number: [id number]  Date of birth: 01-17-1956   I have seen and evaluated Maurice Bell and discussed their care with the Residency Team. Briefly, Maurice Bell is a 62yo man with PMH of ESRD, HTN, HCV and h/o CVA who presented due to worsening chronic back pain and missing HD.  Was found to be hyperkalemic and underwent urgent HD.  He had noticed finger, toes and head tingling when his K was elevated and then he felt better when his K came down.  He is due to get work up for his chronic back pain with his PCP.  Finally, in the ED, he was found to have a corneal abrasion.  He does not remember any trauma to the eye and denies wearing contacts.    Soc Hx : Currently not smoking  Vitals:   07/08/18 2300 07/09/18 0530  BP: (!) 175/109 (!) 157/90  Pulse: 89 69  Resp: 18 17  Temp: 98.1 F (36.7 C) 98.8 F (37.1 C)  SpO2: 91% 99%   General: Sitting up in bed, NAD Eyes: conjunctival injection in the right eye, sensitive to light, pupil is reactive.  Left eye shows no conjunctival injection.  HENT: Neck is supple CV: RR, NR, no murmur Pulm: CTAB, no wheezing Abd: Soft, +BS Ext: MInimal edema in LE.  He has a graft in RUE with no thrill, he has a graft in LUE with a minimal thrill.  He reports getting HD through his left AVF.   Assessment and Plan: I have seen and evaluated the patient as outlined above. I agree with the formulated Assessment and Plan as detailed in the residents' note, with the following changes:   1. Hyperkalemia 2/2 ESRD and missing HD - Treatment went weil with Calcium, IV bicarb, insulin and HD yesterday.  K is now normalized - Nephrology following - recommend discharge to go to his normal HD today  2. Arthritic back pain - Stable at this time, he has continued complaints but acute flare of pain seems to have improved - Follow up with PCP as planned.   3. HTN - Improved somewhat today.  He was  started back on his home medications.   4. Corneal abrasion - He will be sent home with abx drops and some pain medication.  Follow up with PCP and/or ophthalmology outpatient.   He will be discharged today.   Sid Falcon, MD 9/7/20198:33 AM

## 2018-07-12 DIAGNOSIS — N186 End stage renal disease: Secondary | ICD-10-CM | POA: Diagnosis not present

## 2018-07-12 DIAGNOSIS — D509 Iron deficiency anemia, unspecified: Secondary | ICD-10-CM | POA: Diagnosis not present

## 2018-07-12 DIAGNOSIS — E875 Hyperkalemia: Secondary | ICD-10-CM | POA: Diagnosis not present

## 2018-07-12 DIAGNOSIS — D631 Anemia in chronic kidney disease: Secondary | ICD-10-CM | POA: Diagnosis not present

## 2018-07-12 DIAGNOSIS — N2581 Secondary hyperparathyroidism of renal origin: Secondary | ICD-10-CM | POA: Diagnosis not present

## 2018-07-14 DIAGNOSIS — E875 Hyperkalemia: Secondary | ICD-10-CM | POA: Diagnosis not present

## 2018-07-14 DIAGNOSIS — D631 Anemia in chronic kidney disease: Secondary | ICD-10-CM | POA: Diagnosis not present

## 2018-07-14 DIAGNOSIS — N186 End stage renal disease: Secondary | ICD-10-CM | POA: Diagnosis not present

## 2018-07-14 DIAGNOSIS — D509 Iron deficiency anemia, unspecified: Secondary | ICD-10-CM | POA: Diagnosis not present

## 2018-07-14 DIAGNOSIS — N2581 Secondary hyperparathyroidism of renal origin: Secondary | ICD-10-CM | POA: Diagnosis not present

## 2018-07-15 DIAGNOSIS — Z992 Dependence on renal dialysis: Secondary | ICD-10-CM | POA: Diagnosis not present

## 2018-07-15 DIAGNOSIS — N186 End stage renal disease: Secondary | ICD-10-CM | POA: Diagnosis not present

## 2018-07-15 DIAGNOSIS — I871 Compression of vein: Secondary | ICD-10-CM | POA: Diagnosis not present

## 2018-07-15 DIAGNOSIS — T82858A Stenosis of vascular prosthetic devices, implants and grafts, initial encounter: Secondary | ICD-10-CM | POA: Diagnosis not present

## 2018-07-19 DIAGNOSIS — E875 Hyperkalemia: Secondary | ICD-10-CM | POA: Diagnosis not present

## 2018-07-19 DIAGNOSIS — N186 End stage renal disease: Secondary | ICD-10-CM | POA: Diagnosis not present

## 2018-07-19 DIAGNOSIS — D631 Anemia in chronic kidney disease: Secondary | ICD-10-CM | POA: Diagnosis not present

## 2018-07-19 DIAGNOSIS — D509 Iron deficiency anemia, unspecified: Secondary | ICD-10-CM | POA: Diagnosis not present

## 2018-07-19 DIAGNOSIS — N2581 Secondary hyperparathyroidism of renal origin: Secondary | ICD-10-CM | POA: Diagnosis not present

## 2018-07-21 DIAGNOSIS — D509 Iron deficiency anemia, unspecified: Secondary | ICD-10-CM | POA: Diagnosis not present

## 2018-07-21 DIAGNOSIS — N2581 Secondary hyperparathyroidism of renal origin: Secondary | ICD-10-CM | POA: Diagnosis not present

## 2018-07-21 DIAGNOSIS — E875 Hyperkalemia: Secondary | ICD-10-CM | POA: Diagnosis not present

## 2018-07-21 DIAGNOSIS — N186 End stage renal disease: Secondary | ICD-10-CM | POA: Diagnosis not present

## 2018-07-21 DIAGNOSIS — D631 Anemia in chronic kidney disease: Secondary | ICD-10-CM | POA: Diagnosis not present

## 2018-07-23 DIAGNOSIS — E875 Hyperkalemia: Secondary | ICD-10-CM | POA: Diagnosis not present

## 2018-07-23 DIAGNOSIS — D631 Anemia in chronic kidney disease: Secondary | ICD-10-CM | POA: Diagnosis not present

## 2018-07-23 DIAGNOSIS — N186 End stage renal disease: Secondary | ICD-10-CM | POA: Diagnosis not present

## 2018-07-23 DIAGNOSIS — D509 Iron deficiency anemia, unspecified: Secondary | ICD-10-CM | POA: Diagnosis not present

## 2018-07-23 DIAGNOSIS — N2581 Secondary hyperparathyroidism of renal origin: Secondary | ICD-10-CM | POA: Diagnosis not present

## 2018-07-26 DIAGNOSIS — D509 Iron deficiency anemia, unspecified: Secondary | ICD-10-CM | POA: Diagnosis not present

## 2018-07-26 DIAGNOSIS — N2581 Secondary hyperparathyroidism of renal origin: Secondary | ICD-10-CM | POA: Diagnosis not present

## 2018-07-26 DIAGNOSIS — N186 End stage renal disease: Secondary | ICD-10-CM | POA: Diagnosis not present

## 2018-07-26 DIAGNOSIS — E875 Hyperkalemia: Secondary | ICD-10-CM | POA: Diagnosis not present

## 2018-07-26 DIAGNOSIS — D631 Anemia in chronic kidney disease: Secondary | ICD-10-CM | POA: Diagnosis not present

## 2018-07-27 ENCOUNTER — Other Ambulatory Visit: Payer: Self-pay

## 2018-07-27 ENCOUNTER — Ambulatory Visit (INDEPENDENT_AMBULATORY_CARE_PROVIDER_SITE_OTHER): Payer: Medicare Other | Admitting: Physician Assistant

## 2018-07-27 ENCOUNTER — Encounter (INDEPENDENT_AMBULATORY_CARE_PROVIDER_SITE_OTHER): Payer: Self-pay | Admitting: Physician Assistant

## 2018-07-27 VITALS — BP 158/97 | HR 103 | Temp 98.0°F | Ht 64.0 in | Wt 140.6 lb

## 2018-07-27 DIAGNOSIS — I10 Essential (primary) hypertension: Secondary | ICD-10-CM

## 2018-07-27 DIAGNOSIS — M549 Dorsalgia, unspecified: Secondary | ICD-10-CM | POA: Diagnosis not present

## 2018-07-27 DIAGNOSIS — Z23 Encounter for immunization: Secondary | ICD-10-CM

## 2018-07-27 DIAGNOSIS — R202 Paresthesia of skin: Secondary | ICD-10-CM | POA: Diagnosis not present

## 2018-07-27 DIAGNOSIS — M542 Cervicalgia: Secondary | ICD-10-CM

## 2018-07-27 MED ORDER — CYCLOBENZAPRINE HCL 10 MG PO TABS: 10.0000 mg | ORAL_TABLET | Freq: Two times a day (BID) | ORAL | 0 refills | Status: DC

## 2018-07-27 MED ORDER — LOSARTAN POTASSIUM 100 MG PO TABS: 100.0000 mg | ORAL_TABLET | Freq: Every day | ORAL | 5 refills | Status: DC

## 2018-07-27 MED ORDER — PREDNISONE 20 MG PO TABS: 60.0000 mg | ORAL_TABLET | Freq: Every day | ORAL | 0 refills | Status: DC

## 2018-07-27 MED FILL — LOSARTAN POTASSIUM 100 MG T: 100 | 30 days supply | Qty: 30 | Fill #0

## 2018-07-27 MED FILL — CYCLOBENZAPRINE 10 MG TAB: 10 | 10 days supply | Qty: 20 | Fill #0

## 2018-07-27 MED FILL — predniSONE 20 MG TABS: 20 | 5 days supply | Qty: 15 | Fill #0

## 2018-07-27 NOTE — Patient Instructions (Signed)
Cervical Radiculopathy  Cervical radiculopathy means that a nerve in the neck is pinched or bruised. This can cause pain or loss of feeling (numbness) that runs from your neck to your arm and fingers.  Follow these instructions at home:  Managing pain  ? Take over-the-counter and prescription medicines only as told by your doctor.  ? If directed, put ice on the injured or painful area.  ? Put ice in a plastic bag.  ? Place a towel between your skin and the bag.  ? Leave the ice on for 20 minutes, 2?3 times per day.  ? If ice does not help, you can try using heat. Take a warm shower or warm bath, or use a heat pack as told by your doctor.  ? You may try a gentle neck and shoulder massage.  Activity  ? Rest as needed. Follow instructions from your doctor about any activities to avoid.  ? Do exercises as told by your doctor or physical therapist.  General instructions  ? If you were given a soft collar, wear it as told by your doctor.  ? Use a flat pillow when you sleep.  ? Keep all follow-up visits as told by your doctor. This is important.  Contact a doctor if:  ? Your condition does not improve with treatment.  Get help right away if:  ? Your pain gets worse and is not controlled with medicine.  ? You lose feeling or feel weak in your hand, arm, face, or leg.  ? You have a fever.  ? You have a stiff neck.  ? You cannot control when you poop or pee (have incontinence).  ? You have trouble with walking, balance, or talking.  This information is not intended to replace advice given to you by your health care provider. Make sure you discuss any questions you have with your health care provider.  Document Released: 10/08/2011 Document Revised: 03/26/2016 Document Reviewed: 12/13/2014  Elsevier Interactive Patient Education ? 2018 Elsevier Inc[employer]

## 2018-07-27 NOTE — Progress Notes (Signed)
Subjective:  Patient ID: Maurice Bell, male    DOB: Jan 07, 1956  Age: 62 y.o. MRN: [id number]  CC: f/u BP. Pain neck and back.  HPI Jenesis Suchy is a 61 y.o. male with a medical history of anxiety, hepatitis C, HSV2, HTN, ESRD, and stroke presents to follow up on hypotension. Last BP 100/59 mmHg. Had been taking Amlodipine 10 mg, Clonidine 0.1 mg, Hydralazine 25 mg, Losartan 50 mg, and Metoprolol 50 mg. Pt instructed to stop Hydralazine 25 mg and all other medications refilled. Pt currently on dialysis on Tuesday, Thursday, and Saturday.     Main complaint today is of neck pain and upper back pain that is associated with tingling of the bilateral upper extremity. Taking Tylenol arthritis without relief. Pain located from the medial margin of the scapula bilaterally to the occiput. Tingling felt on the ulnar aspect of the bilateral UE. Does not endorse weakness, paralysis, numbness, CP, palpitations, SOB, HA, f/c/n/v, swelling, or rash.    Outpatient Medications Prior to Visit  Medication Sig Dispense Refill  . acetaminophen (TYLENOL) 500 MG tablet Take 1 tablet (500 mg total) by mouth every 8 (eight) hours as needed. 60 tablet 2  . amLODipine (NORVASC) 10 MG tablet Take 1 tablet (10 mg total) by mouth at bedtime. 30 tablet 5  . calcium carbonate (TUMS - DOSED IN MG ELEMENTAL CALCIUM) 500 MG chewable tablet Chew 2 tablets (400 mg of elemental calcium total) by mouth 3 (three) times daily with meals. 90 tablet 5  . cloNIDine (CATAPRES) 0.1 MG tablet Take 1 tablet (0.1 mg total) by mouth 2 (two) times daily. 60 tablet 5  . losartan (COZAAR) 50 MG tablet Take 1 tablet (50 mg total) by mouth at bedtime. 30 tablet 5  . metoprolol tartrate (LOPRESSOR) 50 MG tablet Take 3 tablets (150 mg total) by mouth 2 (two) times daily. 60 tablet 5  . ranitidine (ZANTAC) 300 MG tablet Take 1 tablet (300 mg total) by mouth at bedtime. 30 tablet 5  . sevelamer carbonate (RENVELA) 800 MG tablet Take 4 tablets  (3,200 mg total) by mouth 3 (three) times daily with meals. 90 tablet 5  . valACYclovir (VALTREX) 500 MG tablet Take 1 tablet (500 mg total) by mouth daily. 30 tablet 11  . B Complex-C (B-COMPLEX WITH VITAMIN C) tablet Take 1 tablet by mouth daily. (Patient not taking: Reported on 07/27/2018) 30 tablet 5  . diphenhydrAMINE (BENADRYL) 50 MG capsule Take 1 capsule (50 mg total) by mouth every 12 (twelve) hours as needed for itching. (Patient not taking: Reported on 07/27/2018) 30 capsule 0  . oxyCODONE-acetaminophen (PERCOCET) 5-325 MG tablet Take 1 tablet by mouth every 6 (six) hours as needed for severe pain (right eye pain). 20 tablet 0  . trimethoprim-polymyxin b (POLYTRIM) ophthalmic solution Place 1 drop into the right eye 2 (two) times daily. 10 mL 0   No facility-administered medications prior to visit.      ROS Review of Systems  Constitutional: Negative for chills, fever and malaise/fatigue.  Eyes: Negative for blurred vision.  Respiratory: Negative for shortness of breath.   Cardiovascular: Negative for chest pain and palpitations.  Gastrointestinal: Negative for abdominal pain and nausea.  Genitourinary: Negative for dysuria and hematuria.  Musculoskeletal: Positive for back pain and neck pain. Negative for joint pain and myalgias.  Skin: Negative for rash.  Neurological: Negative for tingling and headaches.  Psychiatric/Behavioral: Negative for depression. The patient is not nervous/anxious.     Objective:  BP (!) 158/97 (  BP Location: Right Arm, Patient Position: Sitting, Cuff Size: Normal)   Pulse (!) 103   Temp 98 F (36.7 C) (Oral)   Ht 5\' 4"  (1.626 m)   Wt 140 lb 9.6 oz (63.8 kg)   SpO2 98%   BMI 24.13 kg/m   BP/Weight 07/27/2018 02/02/3294 11/09/8414  Systolic BP 606 301 -  Diastolic BP 97 90 -  Wt. (Lbs) 140.6 - 137.57  BMI 24.13 - 23.61      Physical Exam  Constitutional: He is oriented to person, place, and time.  Well developed, well nourished, NAD, polite   HENT:  Head: Normocephalic and atraumatic.  Eyes: No scleral icterus.  Neck: Normal range of motion. Neck supple. No thyromegaly present.  Cardiovascular: Normal rate, regular rhythm and normal heart sounds.  Pulmonary/Chest: Effort normal and breath sounds normal.  Musculoskeletal: He exhibits no edema.  Axial load testing of the neck negative. Full aROM of neck with pain elicited on left lateral flexion and forward flexion.  Neurological: He is alert and oriented to person, place, and time. No cranial nerve deficit or sensory deficit. He exhibits normal muscle tone. Coordination normal.  Strength 5/5 of the UE bilaterally  Skin: Skin is warm and dry. No rash noted. No erythema. No pallor.  Psychiatric: He has a normal mood and affect. His behavior is normal. Thought content normal.  Vitals reviewed.    Assessment & Plan:   1. Neck pain - DG Cervical Spine Complete; Future  2. Upper back pain - DG Cervical Spine Complete; Future  3. Paresthesia - DG Cervical Spine Complete; Future  4. Need for prophylactic vaccination and inoculation against influenza - Administered Fluarix  5. Hypertension, unspecified type - Lipid panel   Meds ordered this encounter  Medications  . losartan (COZAAR) 100 MG tablet    Sig: Take 1 tablet (100 mg total) by mouth daily.    Dispense:  30 tablet    Refill:  5    Order Specific Question:   Supervising Provider    Answer:   Charlott Rakes [[id number]]  . predniSONE (DELTASONE) 20 MG tablet    Sig: Take 3 tablets (60 mg total) by mouth daily with breakfast.    Dispense:  15 tablet    Refill:  0    Order Specific Question:   Supervising Provider    Answer:   Charlott Rakes [[id number]]  . cyclobenzaprine (FLEXERIL) 10 MG tablet    Sig: Take 1 tablet (10 mg total) by mouth 2 (two) times daily.    Dispense:  20 tablet    Refill:  0    Order Specific Question:   Supervising Provider    Answer:   Charlott Rakes [[id number]]    Follow-up: Return in  about 4 weeks (around 08/24/2018) for neck pain.   [name] PA

## 2018-07-28 DIAGNOSIS — D631 Anemia in chronic kidney disease: Secondary | ICD-10-CM | POA: Diagnosis not present

## 2018-07-28 DIAGNOSIS — E875 Hyperkalemia: Secondary | ICD-10-CM | POA: Diagnosis not present

## 2018-07-28 DIAGNOSIS — N186 End stage renal disease: Secondary | ICD-10-CM | POA: Diagnosis not present

## 2018-07-28 DIAGNOSIS — D509 Iron deficiency anemia, unspecified: Secondary | ICD-10-CM | POA: Diagnosis not present

## 2018-07-28 DIAGNOSIS — N2581 Secondary hyperparathyroidism of renal origin: Secondary | ICD-10-CM | POA: Diagnosis not present

## 2018-07-28 LAB — LIPID PANEL
CHOL/HDL RATIO: 2 ratio (ref 0.0–5.0)
Cholesterol, Total: 120 mg/dL (ref 100–199)
HDL: 61 mg/dL (ref >39)
LDL CALC: 46 mg/dL (ref 0–99)
Triglycerides: 63 mg/dL (ref 0–149)
VLDL CHOLESTEROL CAL: 13 mg/dL (ref 5–40)

## 2018-07-29 ENCOUNTER — Telehealth (INDEPENDENT_AMBULATORY_CARE_PROVIDER_SITE_OTHER): Payer: Self-pay

## 2018-07-29 NOTE — Telephone Encounter (Signed)
-----   Message from [name], PA-C sent at 07/28/2018  6:28 PM EDT ----- Normal cholesterol.

## 2018-07-29 NOTE — Telephone Encounter (Signed)
Patient voicemail not setup yet. [name], CMA

## 2018-08-02 ENCOUNTER — Encounter (INDEPENDENT_AMBULATORY_CARE_PROVIDER_SITE_OTHER): Payer: Self-pay

## 2018-08-02 ENCOUNTER — Telehealth (INDEPENDENT_AMBULATORY_CARE_PROVIDER_SITE_OTHER): Payer: Self-pay

## 2018-08-02 DIAGNOSIS — E875 Hyperkalemia: Secondary | ICD-10-CM | POA: Diagnosis not present

## 2018-08-02 DIAGNOSIS — Z992 Dependence on renal dialysis: Secondary | ICD-10-CM | POA: Diagnosis not present

## 2018-08-02 DIAGNOSIS — N2581 Secondary hyperparathyroidism of renal origin: Secondary | ICD-10-CM | POA: Diagnosis not present

## 2018-08-02 DIAGNOSIS — I129 Hypertensive chronic kidney disease with stage 1 through stage 4 chronic kidney disease, or unspecified chronic kidney disease: Secondary | ICD-10-CM | POA: Diagnosis not present

## 2018-08-02 DIAGNOSIS — D509 Iron deficiency anemia, unspecified: Secondary | ICD-10-CM | POA: Diagnosis not present

## 2018-08-02 DIAGNOSIS — D631 Anemia in chronic kidney disease: Secondary | ICD-10-CM | POA: Diagnosis not present

## 2018-08-02 DIAGNOSIS — N186 End stage renal disease: Secondary | ICD-10-CM | POA: Diagnosis not present

## 2018-08-02 NOTE — Telephone Encounter (Signed)
-----   Message from [name], PA-C sent at 07/28/2018  6:28 PM EDT ----- Normal cholesterol.

## 2018-08-02 NOTE — Telephone Encounter (Signed)
Patient voicemail not setup. Results mailed. [name], CMA

## 2018-08-04 DIAGNOSIS — D509 Iron deficiency anemia, unspecified: Secondary | ICD-10-CM | POA: Diagnosis not present

## 2018-08-04 DIAGNOSIS — N186 End stage renal disease: Secondary | ICD-10-CM | POA: Diagnosis not present

## 2018-08-04 DIAGNOSIS — E875 Hyperkalemia: Secondary | ICD-10-CM | POA: Diagnosis not present

## 2018-08-04 DIAGNOSIS — D631 Anemia in chronic kidney disease: Secondary | ICD-10-CM | POA: Diagnosis not present

## 2018-08-04 DIAGNOSIS — N2581 Secondary hyperparathyroidism of renal origin: Secondary | ICD-10-CM | POA: Diagnosis not present

## 2018-08-06 DIAGNOSIS — E875 Hyperkalemia: Secondary | ICD-10-CM | POA: Diagnosis not present

## 2018-08-06 DIAGNOSIS — N2581 Secondary hyperparathyroidism of renal origin: Secondary | ICD-10-CM | POA: Diagnosis not present

## 2018-08-06 DIAGNOSIS — D509 Iron deficiency anemia, unspecified: Secondary | ICD-10-CM | POA: Diagnosis not present

## 2018-08-06 DIAGNOSIS — N186 End stage renal disease: Secondary | ICD-10-CM | POA: Diagnosis not present

## 2018-08-06 DIAGNOSIS — D631 Anemia in chronic kidney disease: Secondary | ICD-10-CM | POA: Diagnosis not present

## 2018-08-09 DIAGNOSIS — N186 End stage renal disease: Secondary | ICD-10-CM | POA: Diagnosis not present

## 2018-08-09 DIAGNOSIS — D509 Iron deficiency anemia, unspecified: Secondary | ICD-10-CM | POA: Diagnosis not present

## 2018-08-09 DIAGNOSIS — E875 Hyperkalemia: Secondary | ICD-10-CM | POA: Diagnosis not present

## 2018-08-09 DIAGNOSIS — N2581 Secondary hyperparathyroidism of renal origin: Secondary | ICD-10-CM | POA: Diagnosis not present

## 2018-08-09 DIAGNOSIS — D631 Anemia in chronic kidney disease: Secondary | ICD-10-CM | POA: Diagnosis not present

## 2018-08-11 DIAGNOSIS — N186 End stage renal disease: Secondary | ICD-10-CM | POA: Diagnosis not present

## 2018-08-11 DIAGNOSIS — E875 Hyperkalemia: Secondary | ICD-10-CM | POA: Diagnosis not present

## 2018-08-11 DIAGNOSIS — N2581 Secondary hyperparathyroidism of renal origin: Secondary | ICD-10-CM | POA: Diagnosis not present

## 2018-08-11 DIAGNOSIS — D631 Anemia in chronic kidney disease: Secondary | ICD-10-CM | POA: Diagnosis not present

## 2018-08-11 DIAGNOSIS — D509 Iron deficiency anemia, unspecified: Secondary | ICD-10-CM | POA: Diagnosis not present

## 2018-08-15 ENCOUNTER — Other Ambulatory Visit: Payer: Self-pay

## 2018-08-15 ENCOUNTER — Emergency Department (HOSPITAL_COMMUNITY): Payer: Medicare Other

## 2018-08-15 ENCOUNTER — Encounter (HOSPITAL_COMMUNITY): Payer: Self-pay | Admitting: Emergency Medicine

## 2018-08-15 ENCOUNTER — Observation Stay (HOSPITAL_COMMUNITY)
Admission: EM | Admit: 2018-08-15 | Discharge: 2018-08-16 | Disposition: A | Discharge status: Home / Self Care | Payer: Medicare Other | Attending: Internal Medicine | Admitting: Internal Medicine

## 2018-08-15 DIAGNOSIS — D631 Anemia in chronic kidney disease: Secondary | ICD-10-CM | POA: Insufficient documentation

## 2018-08-15 DIAGNOSIS — Z87891 Personal history of nicotine dependence: Secondary | ICD-10-CM | POA: Insufficient documentation

## 2018-08-15 DIAGNOSIS — B1920 Unspecified viral hepatitis C without hepatic coma: Secondary | ICD-10-CM | POA: Diagnosis not present

## 2018-08-15 DIAGNOSIS — Z992 Dependence on renal dialysis: Secondary | ICD-10-CM | POA: Insufficient documentation

## 2018-08-15 DIAGNOSIS — F419 Anxiety disorder, unspecified: Secondary | ICD-10-CM | POA: Insufficient documentation

## 2018-08-15 DIAGNOSIS — R202 Paresthesia of skin: Secondary | ICD-10-CM | POA: Diagnosis not present

## 2018-08-15 DIAGNOSIS — M79601 Pain in right arm: Secondary | ICD-10-CM | POA: Diagnosis not present

## 2018-08-15 DIAGNOSIS — I10 Essential (primary) hypertension: Secondary | ICD-10-CM | POA: Diagnosis not present

## 2018-08-15 DIAGNOSIS — Z79899 Other long term (current) drug therapy: Secondary | ICD-10-CM | POA: Diagnosis not present

## 2018-08-15 DIAGNOSIS — Z8673 Personal history of transient ischemic attack (TIA), and cerebral infarction without residual deficits: Secondary | ICD-10-CM | POA: Diagnosis not present

## 2018-08-15 DIAGNOSIS — N186 End stage renal disease: Secondary | ICD-10-CM | POA: Diagnosis not present

## 2018-08-15 DIAGNOSIS — M542 Cervicalgia: Secondary | ICD-10-CM | POA: Diagnosis not present

## 2018-08-15 DIAGNOSIS — R52 Pain, unspecified: Secondary | ICD-10-CM

## 2018-08-15 DIAGNOSIS — Z7982 Long term (current) use of aspirin: Secondary | ICD-10-CM | POA: Insufficient documentation

## 2018-08-15 DIAGNOSIS — I120 Hypertensive chronic kidney disease with stage 5 chronic kidney disease or end stage renal disease: Secondary | ICD-10-CM | POA: Insufficient documentation

## 2018-08-15 DIAGNOSIS — E875 Hyperkalemia: Principal | ICD-10-CM | POA: Diagnosis present

## 2018-08-15 DIAGNOSIS — E8889 Other specified metabolic disorders: Secondary | ICD-10-CM | POA: Diagnosis not present

## 2018-08-15 DIAGNOSIS — Z981 Arthrodesis status: Secondary | ICD-10-CM | POA: Diagnosis not present

## 2018-08-15 DIAGNOSIS — M7989 Other specified soft tissue disorders: Secondary | ICD-10-CM | POA: Diagnosis not present

## 2018-08-15 LAB — BASIC METABOLIC PANEL
ANION GAP: 19 — AB (ref 5–15)
BUN: 119 mg/dL — ABNORMAL HIGH (ref 6–20)
CO2: 22 mmol/L (ref 22–32)
Calcium: 7.6 mg/dL — ABNORMAL LOW (ref 8.9–10.3)
Chloride: 98 mmol/L (ref 98–111)
Creatinine, Ser: 18.17 mg/dL — ABNORMAL HIGH (ref 0.61–1.24)
GFR, EST AFRICAN AMERICAN: 3 mL/min — AB (ref >60)
GFR, EST NON AFRICAN AMERICAN: 2 mL/min — AB (ref >60)
Glucose, Bld: 92 mg/dL (ref 70–99)
POTASSIUM: 7.3 mmol/L — AB (ref 3.5–5.1)
SODIUM: 139 mmol/L (ref 135–145)

## 2018-08-15 LAB — POTASSIUM: Potassium: 3.6 mmol/L (ref 3.5–5.1)

## 2018-08-15 LAB — CBC
HCT: 31.2 % — ABNORMAL LOW (ref 39.0–52.0)
Hemoglobin: 9.4 g/dL — ABNORMAL LOW (ref 13.0–17.0)
MCH: 33.9 pg (ref 26.0–34.0)
MCHC: 30.1 g/dL (ref 30.0–36.0)
MCV: 112.6 fL — ABNORMAL HIGH (ref 80.0–100.0)
Platelets: 118 10*3/uL — ABNORMAL LOW (ref 150–400)
RBC: 2.77 MIL/uL — AB (ref 4.22–5.81)
RDW: 13.4 % (ref 11.5–15.5)
WBC: 6.4 10*3/uL (ref 4.0–10.5)
nRBC: 0 % (ref 0.0–0.2)

## 2018-08-15 LAB — VITAMIN B12: Vitamin B-12: 197 pg/mL (ref 180–914)

## 2018-08-15 LAB — CBG MONITORING, ED: GLUCOSE-CAPILLARY: 106 mg/dL — AB (ref 70–99)

## 2018-08-15 MED ORDER — CLONIDINE HCL 0.1 MG PO TABS
0.1000 mg | ORAL_TABLET | Freq: Two times a day (BID) | ORAL | Status: DC
  Administered 2018-08-16: 0.1 mg via ORAL
  Filled 2018-08-15: qty 1

## 2018-08-15 MED ORDER — ACETAMINOPHEN 650 MG RE SUPP: 650.0000 mg | Freq: Four times a day (QID) | RECTAL | Status: DC | PRN

## 2018-08-15 MED ORDER — SODIUM CHLORIDE 0.9 % IV SOLN
1.0000 g | Freq: Once | INTRAVENOUS | Status: DC
  Filled 2018-08-15: qty 10

## 2018-08-15 MED ORDER — SODIUM CHLORIDE 0.9 % IV SOLN: 100.0000 mL | INTRAVENOUS | Status: DC | PRN

## 2018-08-15 MED ORDER — DEXTROSE 50 % IV SOLN: 1.0000 | Freq: Once | INTRAVENOUS | Status: DC

## 2018-08-15 MED ORDER — DEXTROSE 50 % IV SOLN
1.0000 | Freq: Once | INTRAVENOUS | Status: AC
  Administered 2018-08-15: 50 mL via INTRAVENOUS
  Filled 2018-08-15: qty 50

## 2018-08-15 MED ORDER — PREDNISONE 20 MG PO TABS
60.0000 mg | ORAL_TABLET | Freq: Every day | ORAL | Status: DC
  Administered 2018-08-16: 60 mg via ORAL
  Filled 2018-08-15: qty 3

## 2018-08-15 MED ORDER — HEPARIN SODIUM (PORCINE) 1000 UNIT/ML DIALYSIS: 1000.0000 [IU] | INTRAMUSCULAR | Status: DC | PRN

## 2018-08-15 MED ORDER — PATIROMER SORBITEX CALCIUM 8.4 G PO PACK
8.4000 g | PACK | Freq: Once | ORAL | Status: AC
  Administered 2018-08-15: 8.4 g via ORAL
  Filled 2018-08-15: qty 1

## 2018-08-15 MED ORDER — ALTEPLASE 2 MG IJ SOLR: 2.0000 mg | Freq: Once | INTRAMUSCULAR | Status: DC | PRN

## 2018-08-15 MED ORDER — SODIUM CHLORIDE 0.9 % IV SOLN: 250.0000 mL | INTRAVENOUS | Status: DC | PRN

## 2018-08-15 MED ORDER — OXYCODONE-ACETAMINOPHEN 5-325 MG PO TABS
1.0000 | ORAL_TABLET | Freq: Once | ORAL | Status: AC
  Administered 2018-08-15: 1 via ORAL
  Filled 2018-08-15: qty 1

## 2018-08-15 MED ORDER — SODIUM CHLORIDE 0.9% FLUSH: 3.0000 mL | INTRAVENOUS | Status: DC | PRN

## 2018-08-15 MED ORDER — SEVELAMER CARBONATE 800 MG PO TABS
3200.0000 mg | ORAL_TABLET | Freq: Three times a day (TID) | ORAL | Status: DC
  Administered 2018-08-16 (×2): 3200 mg via ORAL
  Filled 2018-08-15 (×2): qty 4

## 2018-08-15 MED ORDER — DEXTROSE 10 % IV SOLN
Freq: Once | INTRAVENOUS | Status: AC
  Administered 2018-08-15: 17:00:00 via INTRAVENOUS

## 2018-08-15 MED ORDER — CYCLOBENZAPRINE HCL 10 MG PO TABS: 10.0000 mg | ORAL_TABLET | Freq: Three times a day (TID) | ORAL | Status: DC | PRN

## 2018-08-15 MED ORDER — METOPROLOL TARTRATE 50 MG PO TABS
150.0000 mg | ORAL_TABLET | Freq: Two times a day (BID) | ORAL | Status: DC
  Administered 2018-08-16 (×2): 150 mg via ORAL
  Filled 2018-08-15 (×3): qty 3

## 2018-08-15 MED ORDER — ONDANSETRON HCL 4 MG/2ML IJ SOLN: 4.0000 mg | Freq: Four times a day (QID) | INTRAMUSCULAR | Status: DC | PRN

## 2018-08-15 MED ORDER — CHLORHEXIDINE GLUCONATE CLOTH 2 % EX PADS: 6.0000 | MEDICATED_PAD | Freq: Every day | CUTANEOUS | Status: DC

## 2018-08-15 MED ORDER — INSULIN ASPART 100 UNIT/ML ~~LOC~~ SOLN
5.0000 [IU] | Freq: Once | SUBCUTANEOUS | Status: AC
  Administered 2018-08-15: 5 [IU] via INTRAVENOUS
  Filled 2018-08-15: qty 1

## 2018-08-15 MED ORDER — ALBUTEROL SULFATE (2.5 MG/3ML) 0.083% IN NEBU
10.0000 mg | INHALATION_SOLUTION | Freq: Once | RESPIRATORY_TRACT | Status: AC
  Administered 2018-08-15: 10 mg via RESPIRATORY_TRACT
  Filled 2018-08-15: qty 12

## 2018-08-15 MED ORDER — LIDOCAINE HCL (PF) 1 % IJ SOLN: 5.0000 mL | INTRAMUSCULAR | Status: DC | PRN

## 2018-08-15 MED ORDER — CALCIUM CARBONATE ANTACID 500 MG PO CHEW: 2.0000 | CHEWABLE_TABLET | ORAL | Status: DC | PRN

## 2018-08-15 MED ORDER — ONDANSETRON HCL 4 MG PO TABS: 4.0000 mg | ORAL_TABLET | Freq: Four times a day (QID) | ORAL | Status: DC | PRN

## 2018-08-15 MED ORDER — ACETAMINOPHEN 325 MG PO TABS
650.0000 mg | ORAL_TABLET | Freq: Four times a day (QID) | ORAL | Status: DC | PRN
  Administered 2018-08-16: 650 mg via ORAL
  Filled 2018-08-15: qty 2

## 2018-08-15 MED ORDER — LIDOCAINE-PRILOCAINE 2.5-2.5 % EX CREA: 1.0000 "application " | TOPICAL_CREAM | CUTANEOUS | Status: DC | PRN

## 2018-08-15 MED ORDER — AMLODIPINE BESYLATE 10 MG PO TABS
10.0000 mg | ORAL_TABLET | Freq: Every day | ORAL | Status: DC
  Administered 2018-08-16: 10 mg via ORAL
  Filled 2018-08-15: qty 1

## 2018-08-15 MED ORDER — INSULIN ASPART 100 UNIT/ML IV SOLN: 5.0000 [IU] | Freq: Once | INTRAVENOUS | Status: DC

## 2018-08-15 MED ORDER — PENTAFLUOROPROP-TETRAFLUOROETH EX AERO: 1.0000 "application " | INHALATION_SPRAY | CUTANEOUS | Status: DC | PRN

## 2018-08-15 MED ORDER — SODIUM CHLORIDE 0.9% FLUSH
3.0000 mL | Freq: Two times a day (BID) | INTRAVENOUS | Status: DC
  Administered 2018-08-16 (×2): 3 mL via INTRAVENOUS
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
## 2018-08-15 NOTE — ED Triage Notes (Signed)
Pt reports he was sent here for MRI of the back. He has had back pain all summer, that causes numbness from the neck into both arms and sometimes down the leg.
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
## 2018-08-15 NOTE — Progress Notes (Signed)
Pt alert and oriented x4. P stable, no acute distress noted. Pt sent to hemodialysis. Report given to nurse.

## 2018-08-16 ENCOUNTER — Observation Stay (HOSPITAL_COMMUNITY): Payer: Medicare Other

## 2018-08-16 DIAGNOSIS — E875 Hyperkalemia: Secondary | ICD-10-CM

## 2018-08-16 DIAGNOSIS — N186 End stage renal disease: Secondary | ICD-10-CM | POA: Diagnosis not present

## 2018-08-16 DIAGNOSIS — M542 Cervicalgia: Secondary | ICD-10-CM

## 2018-08-16 LAB — RENAL FUNCTION PANEL
Albumin: 3.8 g/dL (ref 3.5–5.0)
Anion gap: 14 (ref 5–15)
BUN: 28 mg/dL — ABNORMAL HIGH (ref 6–20)
CO2: 26 mmol/L (ref 22–32)
Calcium: 8.7 mg/dL — ABNORMAL LOW (ref 8.9–10.3)
Chloride: 95 mmol/L — ABNORMAL LOW (ref 98–111)
Creatinine, Ser: 5.73 mg/dL — ABNORMAL HIGH (ref 0.61–1.24)
GFR calc non Af Amer: 10 mL/min — ABNORMAL LOW (ref >60)
GFR, EST AFRICAN AMERICAN: 11 mL/min — AB (ref >60)
GLUCOSE: 83 mg/dL (ref 70–99)
PHOSPHORUS: 2.9 mg/dL (ref 2.5–4.6)
POTASSIUM: 3.5 mmol/L (ref 3.5–5.1)
SODIUM: 135 mmol/L (ref 135–145)

## 2018-08-16 LAB — CBC
HCT: 31.3 % — ABNORMAL LOW (ref 39.0–52.0)
HEMOGLOBIN: 10.2 g/dL — AB (ref 13.0–17.0)
MCH: 34.6 pg — ABNORMAL HIGH (ref 26.0–34.0)
MCHC: 32.6 g/dL (ref 30.0–36.0)
MCV: 106.1 fL — ABNORMAL HIGH (ref 80.0–100.0)
Platelets: 132 10*3/uL — ABNORMAL LOW (ref 150–400)
RBC: 2.95 MIL/uL — AB (ref 4.22–5.81)
RDW: 13.4 % (ref 11.5–15.5)
WBC: 6.1 10*3/uL (ref 4.0–10.5)
nRBC: 0 % (ref 0.0–0.2)

## 2018-08-16 MED ORDER — LORAZEPAM 2 MG/ML IJ SOLN
1.0000 mg | Freq: Once | INTRAMUSCULAR | Status: AC
  Administered 2018-08-16: 1 mg via INTRAVENOUS
  Filled 2018-08-16: qty 1

## 2018-08-16 MED ORDER — CYANOCOBALAMIN 1000 MCG/ML IJ SOLN
1000.0000 ug | Freq: Once | INTRAMUSCULAR | Status: AC
  Administered 2018-08-16: 1000 ug via INTRAMUSCULAR
  Filled 2018-08-16: qty 1

## 2018-08-16 MED ORDER — CYANOCOBALAMIN 1000 MCG PO TABS: 1000.0000 ug | ORAL_TABLET | Freq: Every day | ORAL | 0 refills | Status: DC

## 2018-08-16 MED ORDER — VITAMIN B-12 1000 MCG PO TABS: 1000.0000 ug | ORAL_TABLET | Freq: Every day | ORAL | Status: DC
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
## 2018-08-16 NOTE — Progress Notes (Signed)
Pt came down for MRI Cspine.  Pt was medicated on the floor for claustrophobia and came down in bed snoring.  Pt did move himself over to the MRI couch.  Pt was in the scanner only long enough to take a 30 second scout and he started knocking on the inside of the scanner and then crawling out on his own.  I asked the pt if he wanted to continue to which he replied no.  Pt was then removed from the scanner and his floor nurse called.  Pt sent back up to his room.
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
## 2018-08-16 NOTE — Progress Notes (Addendum)
Apparently patient was able to have ankle monitor removed.  MRI re-ordered

## 2018-08-16 NOTE — Care Management Obs Status (Signed)
[place] NOTIFICATION   Patient Details  Name: Ranard Harte MRN: [id number] Date of Birth: [birth year]   Medicare Observation Status Notification Given:  Yes    Midge Minium RN, BSN, NCM-BC, ACM-RN [phone] 08/16/2018, 2:42 PM
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
## 2018-08-16 NOTE — Progress Notes (Signed)
Pt came down to MRI for scan.  When getting pt undressed it was noted that pt has an electronic house arrest monitor around his ankle.  When questioned the pt stated he was due to have it for at least 6 months.  We are unable to scan the patient as the device is not MRI compatible.  Floor nurse called and pt was sent back upstairs.

## 2018-08-18 DIAGNOSIS — D509 Iron deficiency anemia, unspecified: Secondary | ICD-10-CM | POA: Diagnosis not present

## 2018-08-18 DIAGNOSIS — N186 End stage renal disease: Secondary | ICD-10-CM | POA: Diagnosis not present

## 2018-08-18 DIAGNOSIS — D631 Anemia in chronic kidney disease: Secondary | ICD-10-CM | POA: Diagnosis not present

## 2018-08-18 DIAGNOSIS — E875 Hyperkalemia: Secondary | ICD-10-CM | POA: Diagnosis not present

## 2018-08-18 DIAGNOSIS — N2581 Secondary hyperparathyroidism of renal origin: Secondary | ICD-10-CM | POA: Diagnosis not present

## 2018-08-18 DIAGNOSIS — I10 Essential (primary) hypertension: Secondary | ICD-10-CM

## 2018-08-20 DIAGNOSIS — D509 Iron deficiency anemia, unspecified: Secondary | ICD-10-CM | POA: Diagnosis not present

## 2018-08-20 DIAGNOSIS — D631 Anemia in chronic kidney disease: Secondary | ICD-10-CM | POA: Diagnosis not present

## 2018-08-20 DIAGNOSIS — E875 Hyperkalemia: Secondary | ICD-10-CM | POA: Diagnosis not present

## 2018-08-20 DIAGNOSIS — N186 End stage renal disease: Secondary | ICD-10-CM | POA: Diagnosis not present

## 2018-08-20 DIAGNOSIS — N2581 Secondary hyperparathyroidism of renal origin: Secondary | ICD-10-CM | POA: Diagnosis not present

## 2018-08-23 ENCOUNTER — Encounter (INDEPENDENT_AMBULATORY_CARE_PROVIDER_SITE_OTHER): Payer: Self-pay | Admitting: Physician Assistant

## 2018-08-23 ENCOUNTER — Ambulatory Visit (INDEPENDENT_AMBULATORY_CARE_PROVIDER_SITE_OTHER): Payer: Medicare Other | Admitting: Physician Assistant

## 2018-08-23 ENCOUNTER — Other Ambulatory Visit: Payer: Self-pay

## 2018-08-23 VITALS — BP 200/98 | HR 78 | Temp 97.6°F | Ht 64.0 in | Wt 158.2 lb

## 2018-08-23 DIAGNOSIS — N186 End stage renal disease: Secondary | ICD-10-CM | POA: Diagnosis not present

## 2018-08-23 DIAGNOSIS — E875 Hyperkalemia: Secondary | ICD-10-CM | POA: Diagnosis not present

## 2018-08-23 DIAGNOSIS — M542 Cervicalgia: Secondary | ICD-10-CM

## 2018-08-23 DIAGNOSIS — R4689 Other symptoms and signs involving appearance and behavior: Secondary | ICD-10-CM | POA: Diagnosis not present

## 2018-08-23 DIAGNOSIS — I10 Essential (primary) hypertension: Secondary | ICD-10-CM

## 2018-08-23 DIAGNOSIS — D509 Iron deficiency anemia, unspecified: Secondary | ICD-10-CM | POA: Diagnosis not present

## 2018-08-23 DIAGNOSIS — D631 Anemia in chronic kidney disease: Secondary | ICD-10-CM | POA: Diagnosis not present

## 2018-08-23 DIAGNOSIS — N2581 Secondary hyperparathyroidism of renal origin: Secondary | ICD-10-CM | POA: Diagnosis not present

## 2018-08-23 MED ORDER — CLONIDINE HCL 0.1 MG PO TABS
0.1000 mg | ORAL_TABLET | Freq: Once | ORAL | Status: AC
  Administered 2018-08-23: 0.1 mg via ORAL

## 2018-08-23 NOTE — Progress Notes (Signed)
Subjective:  Patient ID: Maurice Bell, male    DOB: 1956/07/03  Age: 62 y.o. MRN: [id number]  CC: Neck pain and tingling  HPI Maurice Bell a 62 y.o.malewith a medical history of anxiety, hepatitis C, HSV2, HTN, ESRD, and stroke presents to f/u on neck pain. Seen nearly one month ago for neck pain with tingling to the bilateral upper extremity. Pain located from the medial margin of the scapula bilaterally to the occiput. Tingling felt on the ulnar aspect of the bilateral UE. Had C spine x ray ordered at last visit but patient did not go to have done. Says he will not go to have neck xray done as he does not like going to the hospital. Had been taking oxycodone for another MSK complaint but has run out.     BP noted to be elevated today at 200/98 mmHg. BP was previously 158/97 mmHg nearly one month ago on the same medications. Reports not having taken his anti-hypertensives this morning. Also has dialysis scheduled after this visit.     Outpatient Medications Prior to Visit  Medication Sig Dispense Refill  . amLODipine (NORVASC) 10 MG tablet Take 1 tablet (10 mg total) by mouth at bedtime. 30 tablet 5  . aspirin-acetaminophen-caffeine (EXCEDRIN MIGRAINE) 250-250-65 MG tablet Take 2 tablets by mouth every 6 (six) hours as needed for headache.    . B Complex-C (B-COMPLEX WITH VITAMIN C) tablet Take 1 tablet by mouth daily. 30 tablet 5  . cloNIDine (CATAPRES) 0.1 MG tablet Take 1 tablet (0.1 mg total) by mouth 2 (two) times daily. 60 tablet 5  . cyanocobalamin 1000 MCG tablet Take 1 tablet (1,000 mcg total) by mouth daily. 30 tablet 0  . metoprolol tartrate (LOPRESSOR) 50 MG tablet Take 3 tablets (150 mg total) by mouth 2 (two) times daily. 60 tablet 5  . sevelamer carbonate (RENVELA) 800 MG tablet Take 4 tablets (3,200 mg total) by mouth 3 (three) times daily with meals. 90 tablet 5  . acetaminophen (TYLENOL) 500 MG tablet Take 1 tablet (500 mg total) by mouth every 8 (eight) hours  as needed. (Patient not taking: Reported on 08/23/2018) 60 tablet 2  . calcium carbonate (TUMS - DOSED IN MG ELEMENTAL CALCIUM) 500 MG chewable tablet Chew 2 tablets (400 mg of elemental calcium total) by mouth 3 (three) times daily with meals. (Patient not taking: Reported on 08/23/2018) 90 tablet 5  . ranitidine (ZANTAC) 300 MG tablet Take 1 tablet (300 mg total) by mouth at bedtime. (Patient taking differently: Take 300 mg by mouth as needed. ) 30 tablet 5  . valACYclovir (VALTREX) 500 MG tablet Take 1 tablet (500 mg total) by mouth daily. 30 tablet 11  . cyclobenzaprine (FLEXERIL) 10 MG tablet Take 1 tablet (10 mg total) by mouth 2 (two) times daily. 20 tablet 0  . diphenhydrAMINE (BENADRYL) 50 MG capsule Take 1 capsule (50 mg total) by mouth every 12 (twelve) hours as needed for itching. 30 capsule 0   No facility-administered medications prior to visit.      ROS Review of Systems  Constitutional: Negative for chills, fever and malaise/fatigue.  Eyes: Negative for blurred vision.  Respiratory: Negative for shortness of breath.   Cardiovascular: Negative for chest pain and palpitations.  Gastrointestinal: Negative for abdominal pain and nausea.  Genitourinary: Negative for dysuria and hematuria.  Musculoskeletal: Positive for neck pain. Negative for joint pain and myalgias.  Skin: Negative for rash.  Neurological: Negative for tingling and headaches.  Psychiatric/Behavioral: Negative for  depression. The patient is not nervous/anxious.     Objective:  BP (!) 200/98 (BP Location: Right Arm, Patient Position: Sitting, Cuff Size: Normal)   Pulse 78   Temp 97.6 F (36.4 C) (Oral)   Ht 5\' 4"  (1.626 m)   Wt 158 lb 3.2 oz (71.8 kg)   SpO2 94%   BMI 27.15 kg/m   BP/Weight 08/23/2018 08/16/2018 15/40/0867  Systolic BP 619 509 -  Diastolic BP 98 91 -  Wt. (Lbs) 158.2 - 143.3  BMI 27.15 - 24.6      Physical Exam  Constitutional: He is oriented to person, place, and time.  Well  developed, well nourished, NAD, polite  HENT:  Head: Normocephalic and atraumatic.  Eyes: No scleral icterus.  Neck: Normal range of motion. Neck supple. No thyromegaly present.  Cardiovascular: Normal rate, regular rhythm and normal heart sounds.  Pulmonary/Chest: Effort normal and breath sounds normal.  Abdominal: Soft. Bowel sounds are normal. There is no tenderness.  Musculoskeletal: He exhibits no edema.  Axial load testing of the neck negative. Full aROM of neck with pain elicited on left lateral flexion and forward flexion.   Neurological: He is alert and oriented to person, place, and time.  Strength 5/5 of the UE bilaterally  Skin: Skin is warm and dry. No rash noted. No erythema. No pallor.  Psychiatric: He has a normal mood and affect. His behavior is normal. Thought content normal.  Vitals reviewed.    Assessment & Plan:   1. Neck pain - AMB referral to orthopedics - Pt advised he will still need to have C spine xray done to work up his condition. Pt refused. Of note, he similarly did not go to have a L spine XR in July after after having complained of back pain with abnormal findings on a previous XR done outside of our system. No imaging records for review available. He eventually had L spine done after seeing another provider 8 days ago.   2. Hypertension, unspecified type - Not taking medications as directed. Advised to take medications as directed.   3. Non-compliant behavior - See #1 and #2 above.   Follow-up: PRN  [name] PA

## 2018-08-25 DIAGNOSIS — D631 Anemia in chronic kidney disease: Secondary | ICD-10-CM | POA: Diagnosis not present

## 2018-08-25 DIAGNOSIS — N2581 Secondary hyperparathyroidism of renal origin: Secondary | ICD-10-CM | POA: Diagnosis not present

## 2018-08-25 DIAGNOSIS — N186 End stage renal disease: Secondary | ICD-10-CM | POA: Diagnosis not present

## 2018-08-25 DIAGNOSIS — E875 Hyperkalemia: Secondary | ICD-10-CM | POA: Diagnosis not present

## 2018-08-25 DIAGNOSIS — D509 Iron deficiency anemia, unspecified: Secondary | ICD-10-CM | POA: Diagnosis not present

## 2018-08-27 DIAGNOSIS — N186 End stage renal disease: Secondary | ICD-10-CM | POA: Diagnosis not present

## 2018-08-27 DIAGNOSIS — N2581 Secondary hyperparathyroidism of renal origin: Secondary | ICD-10-CM | POA: Diagnosis not present

## 2018-08-27 DIAGNOSIS — D509 Iron deficiency anemia, unspecified: Secondary | ICD-10-CM | POA: Diagnosis not present

## 2018-08-27 DIAGNOSIS — E875 Hyperkalemia: Secondary | ICD-10-CM | POA: Diagnosis not present

## 2018-08-27 DIAGNOSIS — D631 Anemia in chronic kidney disease: Secondary | ICD-10-CM | POA: Diagnosis not present

## 2018-08-30 DIAGNOSIS — E875 Hyperkalemia: Secondary | ICD-10-CM | POA: Diagnosis not present

## 2018-08-30 DIAGNOSIS — D631 Anemia in chronic kidney disease: Secondary | ICD-10-CM | POA: Diagnosis not present

## 2018-08-30 DIAGNOSIS — N2581 Secondary hyperparathyroidism of renal origin: Secondary | ICD-10-CM | POA: Diagnosis not present

## 2018-08-30 DIAGNOSIS — N186 End stage renal disease: Secondary | ICD-10-CM | POA: Diagnosis not present

## 2018-08-30 DIAGNOSIS — D509 Iron deficiency anemia, unspecified: Secondary | ICD-10-CM | POA: Diagnosis not present

## 2018-09-01 DIAGNOSIS — D509 Iron deficiency anemia, unspecified: Secondary | ICD-10-CM | POA: Diagnosis not present

## 2018-09-01 DIAGNOSIS — N186 End stage renal disease: Secondary | ICD-10-CM | POA: Diagnosis not present

## 2018-09-01 DIAGNOSIS — D631 Anemia in chronic kidney disease: Secondary | ICD-10-CM | POA: Diagnosis not present

## 2018-09-01 DIAGNOSIS — N2581 Secondary hyperparathyroidism of renal origin: Secondary | ICD-10-CM | POA: Diagnosis not present

## 2018-09-01 DIAGNOSIS — E875 Hyperkalemia: Secondary | ICD-10-CM | POA: Diagnosis not present

## 2018-09-01 MED FILL — METOPROLOL TARTRATE 50 MG T: 50 | 30 days supply | Qty: 180 | Fill #0

## 2018-09-01 MED FILL — AMLODIPINE BESYLATE 10 MG T: 10 | 30 days supply | Qty: 30 | Fill #0

## 2018-09-02 DIAGNOSIS — N186 End stage renal disease: Secondary | ICD-10-CM | POA: Diagnosis not present

## 2018-09-02 DIAGNOSIS — I129 Hypertensive chronic kidney disease with stage 1 through stage 4 chronic kidney disease, or unspecified chronic kidney disease: Secondary | ICD-10-CM | POA: Diagnosis not present

## 2018-09-02 DIAGNOSIS — Z992 Dependence on renal dialysis: Secondary | ICD-10-CM | POA: Diagnosis not present

## 2018-09-02 MED FILL — VALACYCLOVIR HCL 500 MG TAB: 500 | 30 days supply | Qty: 30 | Fill #1 | Status: TO

## 2018-09-03 DIAGNOSIS — E875 Hyperkalemia: Secondary | ICD-10-CM | POA: Diagnosis not present

## 2018-09-03 DIAGNOSIS — N2581 Secondary hyperparathyroidism of renal origin: Secondary | ICD-10-CM | POA: Diagnosis not present

## 2018-09-03 DIAGNOSIS — N186 End stage renal disease: Secondary | ICD-10-CM | POA: Diagnosis not present

## 2018-09-03 DIAGNOSIS — D631 Anemia in chronic kidney disease: Secondary | ICD-10-CM | POA: Diagnosis not present

## 2018-09-06 ENCOUNTER — Emergency Department (HOSPITAL_COMMUNITY): Payer: Medicare Other

## 2018-09-06 ENCOUNTER — Other Ambulatory Visit: Payer: Self-pay

## 2018-09-06 ENCOUNTER — Encounter (HOSPITAL_COMMUNITY): Payer: Self-pay | Admitting: Emergency Medicine

## 2018-09-06 ENCOUNTER — Emergency Department (HOSPITAL_COMMUNITY)
Admission: EM | Admit: 2018-09-06 | Discharge: 2018-09-06 | Disposition: A | Discharge status: Home / Self Care | Payer: Medicare Other | Source: Home / Self Care | Attending: Emergency Medicine | Admitting: Emergency Medicine

## 2018-09-06 DIAGNOSIS — D631 Anemia in chronic kidney disease: Secondary | ICD-10-CM

## 2018-09-06 DIAGNOSIS — I10 Essential (primary) hypertension: Secondary | ICD-10-CM | POA: Diagnosis not present

## 2018-09-06 DIAGNOSIS — Z7982 Long term (current) use of aspirin: Secondary | ICD-10-CM

## 2018-09-06 DIAGNOSIS — Z8673 Personal history of transient ischemic attack (TIA), and cerebral infarction without residual deficits: Secondary | ICD-10-CM | POA: Insufficient documentation

## 2018-09-06 DIAGNOSIS — Z87891 Personal history of nicotine dependence: Secondary | ICD-10-CM | POA: Insufficient documentation

## 2018-09-06 DIAGNOSIS — G259 Extrapyramidal and movement disorder, unspecified: Secondary | ICD-10-CM | POA: Diagnosis not present

## 2018-09-06 DIAGNOSIS — N186 End stage renal disease: Secondary | ICD-10-CM | POA: Insufficient documentation

## 2018-09-06 DIAGNOSIS — R4781 Slurred speech: Secondary | ICD-10-CM | POA: Diagnosis not present

## 2018-09-06 DIAGNOSIS — H539 Unspecified visual disturbance: Secondary | ICD-10-CM | POA: Diagnosis not present

## 2018-09-06 DIAGNOSIS — I120 Hypertensive chronic kidney disease with stage 5 chronic kidney disease or end stage renal disease: Secondary | ICD-10-CM

## 2018-09-06 DIAGNOSIS — Z79899 Other long term (current) drug therapy: Secondary | ICD-10-CM

## 2018-09-06 DIAGNOSIS — I69354 Hemiplegia and hemiparesis following cerebral infarction affecting left non-dominant side: Secondary | ICD-10-CM | POA: Diagnosis not present

## 2018-09-06 DIAGNOSIS — G255 Other chorea: Secondary | ICD-10-CM | POA: Diagnosis not present

## 2018-09-06 DIAGNOSIS — R4702 Dysphasia: Secondary | ICD-10-CM | POA: Diagnosis not present

## 2018-09-06 DIAGNOSIS — Z992 Dependence on renal dialysis: Secondary | ICD-10-CM

## 2018-09-06 DIAGNOSIS — R270 Ataxia, unspecified: Secondary | ICD-10-CM

## 2018-09-06 DIAGNOSIS — R29810 Facial weakness: Secondary | ICD-10-CM | POA: Diagnosis not present

## 2018-09-06 DIAGNOSIS — R471 Dysarthria and anarthria: Secondary | ICD-10-CM

## 2018-09-06 DIAGNOSIS — N189 Chronic kidney disease, unspecified: Secondary | ICD-10-CM

## 2018-09-06 DIAGNOSIS — F419 Anxiety disorder, unspecified: Secondary | ICD-10-CM

## 2018-09-06 DIAGNOSIS — I69353 Hemiplegia and hemiparesis following cerebral infarction affecting right non-dominant side: Secondary | ICD-10-CM | POA: Diagnosis not present

## 2018-09-06 DIAGNOSIS — G9341 Metabolic encephalopathy: Secondary | ICD-10-CM | POA: Diagnosis not present

## 2018-09-06 DIAGNOSIS — R200 Anesthesia of skin: Secondary | ICD-10-CM | POA: Diagnosis not present

## 2018-09-06 DIAGNOSIS — R9431 Abnormal electrocardiogram [ECG] [EKG]: Secondary | ICD-10-CM | POA: Diagnosis not present

## 2018-09-06 LAB — CBC
HCT: 34.1 % — ABNORMAL LOW (ref 39.0–52.0)
Hemoglobin: 10.4 g/dL — ABNORMAL LOW (ref 13.0–17.0)
MCH: 33.2 pg (ref 26.0–34.0)
MCHC: 30.5 g/dL (ref 30.0–36.0)
MCV: 108.9 fL — ABNORMAL HIGH (ref 80.0–100.0)
NRBC: 0 % (ref 0.0–0.2)
PLATELETS: 204 10*3/uL (ref 150–400)
RBC: 3.13 MIL/uL — ABNORMAL LOW (ref 4.22–5.81)
RDW: 13.4 % (ref 11.5–15.5)
WBC: 4.7 10*3/uL (ref 4.0–10.5)

## 2018-09-06 LAB — DIFFERENTIAL
Abs Immature Granulocytes: 0.01 10*3/uL (ref 0.00–0.07)
BASOS PCT: 0 %
Basophils Absolute: 0 10*3/uL (ref 0.0–0.1)
EOS ABS: 0.2 10*3/uL (ref 0.0–0.5)
EOS PCT: 3 %
IMMATURE GRANULOCYTES: 0 %
Lymphocytes Relative: 34 %
Lymphs Abs: 1.6 10*3/uL (ref 0.7–4.0)
Monocytes Absolute: 0.7 10*3/uL (ref 0.1–1.0)
Monocytes Relative: 14 %
Neutro Abs: 2.3 10*3/uL (ref 1.7–7.7)
Neutrophils Relative %: 49 %

## 2018-09-06 LAB — COMPREHENSIVE METABOLIC PANEL
ALK PHOS: 95 U/L (ref 38–126)
ALT: 36 U/L (ref 0–44)
ANION GAP: 16 — AB (ref 5–15)
AST: 38 U/L (ref 15–41)
Albumin: 3.8 g/dL (ref 3.5–5.0)
BILIRUBIN TOTAL: 0.4 mg/dL (ref 0.3–1.2)
BUN: 66 mg/dL — ABNORMAL HIGH (ref 6–20)
CO2: 29 mmol/L (ref 22–32)
Calcium: 8.8 mg/dL — ABNORMAL LOW (ref 8.9–10.3)
Chloride: 96 mmol/L — ABNORMAL LOW (ref 98–111)
Creatinine, Ser: 13.8 mg/dL — ABNORMAL HIGH (ref 0.61–1.24)
GFR calc Af Amer: 4 mL/min — ABNORMAL LOW (ref >60)
GFR calc non Af Amer: 3 mL/min — ABNORMAL LOW (ref >60)
GLUCOSE: 112 mg/dL — AB (ref 70–99)
Potassium: 4.9 mmol/L (ref 3.5–5.1)
Sodium: 141 mmol/L (ref 135–145)
TOTAL PROTEIN: 7.6 g/dL (ref 6.5–8.1)

## 2018-09-06 LAB — I-STAT CHEM 8, ED
BUN: 61 mg/dL — AB (ref 6–20)
CALCIUM ION: 0.96 mmol/L — AB (ref 1.15–1.40)
Chloride: 100 mmol/L (ref 98–111)
Creatinine, Ser: 14.8 mg/dL — ABNORMAL HIGH (ref 0.61–1.24)
Glucose, Bld: 107 mg/dL — ABNORMAL HIGH (ref 70–99)
HCT: 30 % — ABNORMAL LOW (ref 39.0–52.0)
Hemoglobin: 10.2 g/dL — ABNORMAL LOW (ref 13.0–17.0)
Potassium: 4.9 mmol/L (ref 3.5–5.1)
SODIUM: 139 mmol/L (ref 135–145)
TCO2: 31 mmol/L (ref 22–32)

## 2018-09-06 LAB — I-STAT TROPONIN, ED: TROPONIN I, POC: 0.02 ng/mL (ref 0.00–0.08)

## 2018-09-06 LAB — PROTIME-INR
INR: 1.05
PROTHROMBIN TIME: 13.6 s (ref 11.4–15.2)

## 2018-09-06 LAB — APTT: aPTT: 33 seconds (ref 24–36)

## 2018-09-06 MED ORDER — LORAZEPAM 2 MG/ML IJ SOLN
1.0000 mg | Freq: Once | INTRAMUSCULAR | Status: AC
  Administered 2018-09-06: 1 mg via INTRAVENOUS
  Filled 2018-09-06: qty 1

## 2018-09-06 MED ORDER — LABETALOL HCL 5 MG/ML IV SOLN
20.0000 mg | Freq: Once | INTRAVENOUS | Status: AC
  Administered 2018-09-06: 20 mg via INTRAVENOUS
  Filled 2018-09-06: qty 4

## 2018-09-06 MED ORDER — IOPAMIDOL (ISOVUE-370) INJECTION 76%
75.0000 mL | Freq: Once | INTRAVENOUS | Status: AC | PRN
  Administered 2018-09-06: 75 mL via INTRAVENOUS

## 2018-09-06 MED ORDER — IOPAMIDOL (ISOVUE-370) INJECTION 76%
INTRAVENOUS | Status: AC
  Filled 2018-09-06: qty 100

## 2018-09-06 NOTE — ED Notes (Signed)
Patient transported to CT 
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
## 2018-09-06 NOTE — ED Notes (Signed)
IV nurse at bedside.

## 2018-09-06 NOTE — ED Notes (Signed)
Patient currently at MRI

## 2018-09-06 NOTE — ED Triage Notes (Signed)
Pt arrives with slurred speech, blurred vision  And R sided numbness since waking up on Sunday morning at approx 8AM. Reports when he went to bed on Saturday but he was okay then. Hx dialysis, [facility].

## 2018-09-06 NOTE — ED Notes (Signed)
IV team consult ordered due to difficult IV venipuncture .

## 2018-09-06 NOTE — Discharge Instructions (Addendum)
Your MRI did not show any new stroke, but you do show signs of having had previous strokes. Please follow up with the neurologist for further evaluation.  Please monitor your blood pressure at home and work with your doctor to keep it under control.  Return if you are having any problems.

## 2018-09-06 NOTE — ED Notes (Signed)
Patient ambulated hallway with standby assist , respirations unlabored / denies pain .

## 2018-09-06 NOTE — ED Triage Notes (Signed)
To ED from dialysis center with dialysis staff stating that he is not his normal self- unsteady on feet, slurred speech, left facial droop-- was discharged this am at 6 with same symptoms- had workup for stroke on night shift 09/06/18. \

## 2018-09-06 NOTE — ED Notes (Signed)
Patient transported to MRI 
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
## 2018-09-06 NOTE — Discharge Instructions (Addendum)
Please follow-up with the neurologist as planned for your symptoms  -because the emergency medicine work-up has been completely normal  The MRI and the CT angiogram does not reveal any abnormalities.

## 2018-09-07 ENCOUNTER — Other Ambulatory Visit: Payer: Self-pay

## 2018-09-07 ENCOUNTER — Encounter (HOSPITAL_COMMUNITY): Payer: Self-pay | Admitting: Family Medicine

## 2018-09-07 ENCOUNTER — Ambulatory Visit (INDEPENDENT_AMBULATORY_CARE_PROVIDER_SITE_OTHER): Payer: Self-pay | Admitting: Orthopaedic Surgery

## 2018-09-07 ENCOUNTER — Inpatient Hospital Stay (HOSPITAL_COMMUNITY)
Admission: EM | Admit: 2018-09-07 | Discharge: 2018-09-11 | DRG: 056 | Disposition: A | Discharge status: Home / Self Care | Payer: Medicare Other | Attending: Internal Medicine | Admitting: Internal Medicine

## 2018-09-07 DIAGNOSIS — G255 Other chorea: Secondary | ICD-10-CM | POA: Diagnosis not present

## 2018-09-07 DIAGNOSIS — Z992 Dependence on renal dialysis: Secondary | ICD-10-CM | POA: Diagnosis not present

## 2018-09-07 DIAGNOSIS — I69353 Hemiplegia and hemiparesis following cerebral infarction affecting right non-dominant side: Secondary | ICD-10-CM

## 2018-09-07 DIAGNOSIS — G9340 Encephalopathy, unspecified: Secondary | ICD-10-CM | POA: Diagnosis present

## 2018-09-07 DIAGNOSIS — I10 Essential (primary) hypertension: Secondary | ICD-10-CM | POA: Diagnosis not present

## 2018-09-07 DIAGNOSIS — G2581 Restless legs syndrome: Secondary | ICD-10-CM | POA: Diagnosis present

## 2018-09-07 DIAGNOSIS — N186 End stage renal disease: Secondary | ICD-10-CM

## 2018-09-07 DIAGNOSIS — Z781 Physical restraint status: Secondary | ICD-10-CM

## 2018-09-07 DIAGNOSIS — F419 Anxiety disorder, unspecified: Secondary | ICD-10-CM | POA: Diagnosis present

## 2018-09-07 DIAGNOSIS — Z79899 Other long term (current) drug therapy: Secondary | ICD-10-CM

## 2018-09-07 DIAGNOSIS — E8889 Other specified metabolic disorders: Secondary | ICD-10-CM | POA: Diagnosis present

## 2018-09-07 DIAGNOSIS — Z87891 Personal history of nicotine dependence: Secondary | ICD-10-CM

## 2018-09-07 DIAGNOSIS — E875 Hyperkalemia: Secondary | ICD-10-CM | POA: Diagnosis present

## 2018-09-07 DIAGNOSIS — Z760 Encounter for issue of repeat prescription: Secondary | ICD-10-CM

## 2018-09-07 DIAGNOSIS — G9341 Metabolic encephalopathy: Secondary | ICD-10-CM | POA: Diagnosis present

## 2018-09-07 DIAGNOSIS — I160 Hypertensive urgency: Secondary | ICD-10-CM | POA: Diagnosis present

## 2018-09-07 DIAGNOSIS — I120 Hypertensive chronic kidney disease with stage 5 chronic kidney disease or end stage renal disease: Secondary | ICD-10-CM | POA: Diagnosis not present

## 2018-09-07 DIAGNOSIS — R29810 Facial weakness: Secondary | ICD-10-CM | POA: Diagnosis present

## 2018-09-07 DIAGNOSIS — I69354 Hemiplegia and hemiparesis following cerebral infarction affecting left non-dominant side: Secondary | ICD-10-CM

## 2018-09-07 DIAGNOSIS — G259 Extrapyramidal and movement disorder, unspecified: Secondary | ICD-10-CM | POA: Diagnosis not present

## 2018-09-07 DIAGNOSIS — R52 Pain, unspecified: Secondary | ICD-10-CM

## 2018-09-07 DIAGNOSIS — R404 Transient alteration of awareness: Secondary | ICD-10-CM | POA: Diagnosis not present

## 2018-09-07 DIAGNOSIS — B1920 Unspecified viral hepatitis C without hepatic coma: Secondary | ICD-10-CM | POA: Diagnosis present

## 2018-09-07 DIAGNOSIS — N2581 Secondary hyperparathyroidism of renal origin: Secondary | ICD-10-CM | POA: Diagnosis present

## 2018-09-07 DIAGNOSIS — E519 Thiamine deficiency, unspecified: Secondary | ICD-10-CM | POA: Diagnosis present

## 2018-09-07 DIAGNOSIS — D539 Nutritional anemia, unspecified: Secondary | ICD-10-CM | POA: Diagnosis present

## 2018-09-07 DIAGNOSIS — G253 Myoclonus: Secondary | ICD-10-CM | POA: Diagnosis present

## 2018-09-07 LAB — CBC WITH DIFFERENTIAL/PLATELET
Abs Immature Granulocytes: 0.01 10*3/uL (ref 0.00–0.07)
Basophils Absolute: 0 10*3/uL (ref 0.0–0.1)
Basophils Relative: 1 %
Eosinophils Absolute: 0.1 10*3/uL (ref 0.0–0.5)
Eosinophils Relative: 2 %
HCT: 32.6 % — ABNORMAL LOW (ref 39.0–52.0)
Hemoglobin: 10.1 g/dL — ABNORMAL LOW (ref 13.0–17.0)
Immature Granulocytes: 0 %
Lymphocytes Relative: 27 %
Lymphs Abs: 1.1 10*3/uL (ref 0.7–4.0)
MCH: 33.1 pg (ref 26.0–34.0)
MCHC: 31 g/dL (ref 30.0–36.0)
MCV: 106.9 fL — ABNORMAL HIGH (ref 80.0–100.0)
Monocytes Absolute: 0.5 10*3/uL (ref 0.1–1.0)
Monocytes Relative: 13 %
Neutro Abs: 2.3 10*3/uL (ref 1.7–7.7)
Neutrophils Relative %: 57 %
Platelets: 187 10*3/uL (ref 150–400)
RBC: 3.05 MIL/uL — ABNORMAL LOW (ref 4.22–5.81)
RDW: 13.4 % (ref 11.5–15.5)
WBC: 4 10*3/uL (ref 4.0–10.5)
nRBC: 0 % (ref 0.0–0.2)

## 2018-09-07 LAB — COMPREHENSIVE METABOLIC PANEL
ALT: 31 U/L (ref 0–44)
AST: 28 U/L (ref 15–41)
Albumin: 3.9 g/dL (ref 3.5–5.0)
Alkaline Phosphatase: 77 U/L (ref 38–126)
Anion gap: 17 — ABNORMAL HIGH (ref 5–15)
BUN: 86 mg/dL — ABNORMAL HIGH (ref 6–20)
CO2: 26 mmol/L (ref 22–32)
Calcium: 8.7 mg/dL — ABNORMAL LOW (ref 8.9–10.3)
Chloride: 94 mmol/L — ABNORMAL LOW (ref 98–111)
Creatinine, Ser: 17.72 mg/dL — ABNORMAL HIGH (ref 0.61–1.24)
GFR calc Af Amer: 3 mL/min — ABNORMAL LOW (ref >60)
GFR calc non Af Amer: 2 mL/min — ABNORMAL LOW (ref >60)
Glucose, Bld: 85 mg/dL (ref 70–99)
Potassium: 6.1 mmol/L — ABNORMAL HIGH (ref 3.5–5.1)
Sodium: 137 mmol/L (ref 135–145)
Total Bilirubin: 0.7 mg/dL (ref 0.3–1.2)
Total Protein: 7.6 g/dL (ref 6.5–8.1)

## 2018-09-07 LAB — MAGNESIUM: MAGNESIUM: 2.7 mg/dL — AB (ref 1.7–2.4)

## 2018-09-07 LAB — AMMONIA: AMMONIA: 28 umol/L (ref 9–35)

## 2018-09-07 LAB — PHOSPHORUS: PHOSPHORUS: 8.5 mg/dL — AB (ref 2.5–4.6)

## 2018-09-07 LAB — ETHANOL: Alcohol, Ethyl (B): <10 mg/dL (ref <10)

## 2018-09-07 MED ORDER — SODIUM CHLORIDE 0.9% FLUSH
3.0000 mL | Freq: Two times a day (BID) | INTRAVENOUS | Status: DC
  Administered 2018-09-07 – 2018-09-10 (×3): 3 mL via INTRAVENOUS

## 2018-09-07 MED ORDER — VITAMIN B-12 1000 MCG PO TABS
2000.0000 ug | ORAL_TABLET | Freq: Every day | ORAL | Status: DC
  Administered 2018-09-07 – 2018-09-11 (×4): 2000 ug via ORAL
  Filled 2018-09-07 (×6): qty 2

## 2018-09-07 MED ORDER — SODIUM CHLORIDE 0.9 % IV SOLN
250.0000 mL | INTRAVENOUS | Status: DC | PRN
  Administered 2018-09-07: 250 mL via INTRAVENOUS

## 2018-09-07 MED ORDER — HEPARIN SODIUM (PORCINE) 5000 UNIT/ML IJ SOLN
5000.0000 [IU] | Freq: Three times a day (TID) | INTRAMUSCULAR | Status: DC
  Administered 2018-09-07 – 2018-09-11 (×8): 5000 [IU] via SUBCUTANEOUS
  Filled 2018-09-07 (×8): qty 1

## 2018-09-07 MED ORDER — SEVELAMER CARBONATE 800 MG PO TABS
3200.0000 mg | ORAL_TABLET | Freq: Three times a day (TID) | ORAL | Status: DC
  Administered 2018-09-09 – 2018-09-11 (×7): 3200 mg via ORAL
  Filled 2018-09-07 (×10): qty 4

## 2018-09-07 MED ORDER — THIAMINE HCL 100 MG/ML IJ SOLN
500.0000 mg | Freq: Three times a day (TID) | INTRAVENOUS | Status: AC
  Administered 2018-09-07 – 2018-09-10 (×9): 500 mg via INTRAVENOUS
  Filled 2018-09-07 (×10): qty 5

## 2018-09-07 MED ORDER — LABETALOL HCL 5 MG/ML IV SOLN
10.0000 mg | INTRAVENOUS | Status: DC | PRN
  Administered 2018-09-07 – 2018-09-08 (×2): 10 mg via INTRAVENOUS
  Filled 2018-09-07 (×2): qty 4

## 2018-09-07 MED ORDER — SODIUM CHLORIDE 0.9% FLUSH: 3.0000 mL | INTRAVENOUS | Status: DC | PRN

## 2018-09-07 MED ORDER — AMLODIPINE BESYLATE 10 MG PO TABS
10.0000 mg | ORAL_TABLET | Freq: Every day | ORAL | Status: DC
  Administered 2018-09-07 – 2018-09-10 (×4): 10 mg via ORAL
  Filled 2018-09-07 (×3): qty 1
  Filled 2018-09-07: qty 2

## 2018-09-07 MED ORDER — FENTANYL CITRATE (PF) 100 MCG/2ML IJ SOLN
12.5000 ug | INTRAMUSCULAR | Status: DC | PRN
  Administered 2018-09-09: 12.5 ug via INTRAVENOUS
  Administered 2018-09-10 – 2018-09-11 (×5): 25 ug via INTRAVENOUS
  Filled 2018-09-07 (×6): qty 2

## 2018-09-07 MED ORDER — CLONIDINE HCL 0.1 MG PO TABS
0.1000 mg | ORAL_TABLET | Freq: Two times a day (BID) | ORAL | Status: DC
  Administered 2018-09-07 – 2018-09-10 (×6): 0.1 mg via ORAL
  Filled 2018-09-07 (×8): qty 1

## 2018-09-07 MED ORDER — ONDANSETRON HCL 4 MG PO TABS: 4.0000 mg | ORAL_TABLET | Freq: Four times a day (QID) | ORAL | Status: DC | PRN

## 2018-09-07 MED ORDER — VITAMIN B-1 100 MG PO TABS: 100.0000 mg | ORAL_TABLET | Freq: Every day | ORAL | Status: DC

## 2018-09-07 MED ORDER — SODIUM POLYSTYRENE SULFONATE 15 GM/60ML PO SUSP
60.0000 g | Freq: Once | ORAL | Status: AC
  Administered 2018-09-07: 60 g via ORAL
  Filled 2018-09-07: qty 240

## 2018-09-07 MED ORDER — SODIUM CHLORIDE 0.9% FLUSH
3.0000 mL | Freq: Two times a day (BID) | INTRAVENOUS | Status: DC
  Administered 2018-09-07 – 2018-09-09 (×3): 3 mL via INTRAVENOUS
  Administered 2018-09-10: 10 mL via INTRAVENOUS

## 2018-09-07 MED ORDER — LOSARTAN POTASSIUM 50 MG PO TABS
100.0000 mg | ORAL_TABLET | Freq: Every day | ORAL | Status: DC
  Administered 2018-09-09 – 2018-09-10 (×2): 100 mg via ORAL
  Filled 2018-09-07 (×4): qty 2

## 2018-09-07 MED ORDER — THIAMINE HCL 100 MG/ML IJ SOLN
250.0000 mg | Freq: Three times a day (TID) | INTRAMUSCULAR | Status: DC
  Administered 2018-09-11 (×2): 250 mg via INTRAVENOUS
  Filled 2018-09-07 (×3): qty 4

## 2018-09-07 MED ORDER — ONDANSETRON HCL 4 MG/2ML IJ SOLN: 4.0000 mg | Freq: Four times a day (QID) | INTRAMUSCULAR | Status: DC | PRN

## 2018-09-07 NOTE — ED Triage Notes (Signed)
Per ems pt has had tremors again and is having more slurred speech and shaking again. Pt was here several times with the same thing this past week. Pt can follow commands.
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
## 2018-09-07 NOTE — Progress Notes (Signed)
Pt admitted from ED with the c/o of uncontrollable jerky movements of the body, pt alert with difficulty in speaking, but follows simple command, settled in bed with call light within pt's reach, tele monitor put and verified on pt, was however reassured and will continue to monitor, safety concern addressed. Ob[place]ogie-Asidi, Brinda Focht Efe
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
## 2018-09-07 NOTE — ED Notes (Signed)
Pt does not make urine.
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
## 2018-09-08 DIAGNOSIS — D539 Nutritional anemia, unspecified: Secondary | ICD-10-CM | POA: Diagnosis present

## 2018-09-08 DIAGNOSIS — E8889 Other specified metabolic disorders: Secondary | ICD-10-CM | POA: Diagnosis present

## 2018-09-08 DIAGNOSIS — G253 Myoclonus: Secondary | ICD-10-CM | POA: Diagnosis present

## 2018-09-08 DIAGNOSIS — R29810 Facial weakness: Secondary | ICD-10-CM | POA: Diagnosis present

## 2018-09-08 DIAGNOSIS — I69354 Hemiplegia and hemiparesis following cerebral infarction affecting left non-dominant side: Secondary | ICD-10-CM | POA: Diagnosis not present

## 2018-09-08 DIAGNOSIS — I120 Hypertensive chronic kidney disease with stage 5 chronic kidney disease or end stage renal disease: Secondary | ICD-10-CM | POA: Diagnosis not present

## 2018-09-08 DIAGNOSIS — B1920 Unspecified viral hepatitis C without hepatic coma: Secondary | ICD-10-CM | POA: Diagnosis present

## 2018-09-08 DIAGNOSIS — Z87891 Personal history of nicotine dependence: Secondary | ICD-10-CM | POA: Diagnosis not present

## 2018-09-08 DIAGNOSIS — N186 End stage renal disease: Secondary | ICD-10-CM

## 2018-09-08 DIAGNOSIS — G9340 Encephalopathy, unspecified: Secondary | ICD-10-CM | POA: Diagnosis not present

## 2018-09-08 DIAGNOSIS — Z992 Dependence on renal dialysis: Secondary | ICD-10-CM

## 2018-09-08 DIAGNOSIS — E875 Hyperkalemia: Secondary | ICD-10-CM | POA: Diagnosis present

## 2018-09-08 DIAGNOSIS — G9341 Metabolic encephalopathy: Secondary | ICD-10-CM | POA: Diagnosis present

## 2018-09-08 DIAGNOSIS — E519 Thiamine deficiency, unspecified: Secondary | ICD-10-CM | POA: Diagnosis present

## 2018-09-08 DIAGNOSIS — G2581 Restless legs syndrome: Secondary | ICD-10-CM | POA: Diagnosis present

## 2018-09-08 DIAGNOSIS — I160 Hypertensive urgency: Secondary | ICD-10-CM | POA: Diagnosis present

## 2018-09-08 DIAGNOSIS — Z79899 Other long term (current) drug therapy: Secondary | ICD-10-CM | POA: Diagnosis not present

## 2018-09-08 DIAGNOSIS — G259 Extrapyramidal and movement disorder, unspecified: Secondary | ICD-10-CM | POA: Diagnosis present

## 2018-09-08 DIAGNOSIS — Z781 Physical restraint status: Secondary | ICD-10-CM | POA: Diagnosis not present

## 2018-09-08 DIAGNOSIS — I69353 Hemiplegia and hemiparesis following cerebral infarction affecting right non-dominant side: Secondary | ICD-10-CM | POA: Diagnosis not present

## 2018-09-08 DIAGNOSIS — N2581 Secondary hyperparathyroidism of renal origin: Secondary | ICD-10-CM | POA: Diagnosis not present

## 2018-09-08 DIAGNOSIS — M25511 Pain in right shoulder: Secondary | ICD-10-CM | POA: Diagnosis not present

## 2018-09-08 DIAGNOSIS — D631 Anemia in chronic kidney disease: Secondary | ICD-10-CM | POA: Diagnosis not present

## 2018-09-08 DIAGNOSIS — F419 Anxiety disorder, unspecified: Secondary | ICD-10-CM | POA: Diagnosis present

## 2018-09-08 DIAGNOSIS — I10 Essential (primary) hypertension: Secondary | ICD-10-CM

## 2018-09-08 LAB — HEPATIC FUNCTION PANEL
ALBUMIN: 3.7 g/dL (ref 3.5–5.0)
ALK PHOS: 67 U/L (ref 38–126)
ALT: 28 U/L (ref 0–44)
AST: 30 U/L (ref 15–41)
Bilirubin, Direct: <0.1 mg/dL (ref 0.0–0.2)
TOTAL PROTEIN: 7.5 g/dL (ref 6.5–8.1)
Total Bilirubin: 0.6 mg/dL (ref 0.3–1.2)

## 2018-09-08 LAB — CBC
HEMATOCRIT: 29.1 % — AB (ref 39.0–52.0)
HEMOGLOBIN: 9.2 g/dL — AB (ref 13.0–17.0)
MCH: 33 pg (ref 26.0–34.0)
MCHC: 31.6 g/dL (ref 30.0–36.0)
MCV: 104.3 fL — ABNORMAL HIGH (ref 80.0–100.0)
Platelets: 169 10*3/uL (ref 150–400)
RBC: 2.79 MIL/uL — ABNORMAL LOW (ref 4.22–5.81)
RDW: 13.3 % (ref 11.5–15.5)
WBC: 3.6 10*3/uL — AB (ref 4.0–10.5)
nRBC: 0 % (ref 0.0–0.2)

## 2018-09-08 LAB — FOLATE: FOLATE: 8.3 ng/mL (ref >5.9)

## 2018-09-08 LAB — BASIC METABOLIC PANEL
Anion gap: 20 — ABNORMAL HIGH (ref 5–15)
BUN: 96 mg/dL — AB (ref 6–20)
CHLORIDE: 96 mmol/L — AB (ref 98–111)
CO2: 24 mmol/L (ref 22–32)
Calcium: 8.3 mg/dL — ABNORMAL LOW (ref 8.9–10.3)
Creatinine, Ser: 19.1 mg/dL — ABNORMAL HIGH (ref 0.61–1.24)
GFR calc Af Amer: 3 mL/min — ABNORMAL LOW (ref >60)
GFR calc non Af Amer: 2 mL/min — ABNORMAL LOW (ref >60)
GLUCOSE: 95 mg/dL (ref 70–99)
Potassium: 4.9 mmol/L (ref 3.5–5.1)
Sodium: 140 mmol/L (ref 135–145)

## 2018-09-08 LAB — VITAMIN B12: Vitamin B-12: 750 pg/mL (ref 180–914)

## 2018-09-08 LAB — SEDIMENTATION RATE: Sed Rate: 92 mm/hr — ABNORMAL HIGH (ref 0–16)

## 2018-09-08 LAB — AMMONIA: AMMONIA: 29 umol/L (ref 9–35)

## 2018-09-08 LAB — TSH: TSH: 0.667 u[IU]/mL (ref 0.350–4.500)

## 2018-09-08 LAB — GLUCOSE, CAPILLARY: Glucose-Capillary: 114 mg/dL — ABNORMAL HIGH (ref 70–99)

## 2018-09-08 LAB — POTASSIUM: Potassium: 5.1 mmol/L (ref 3.5–5.1)

## 2018-09-08 LAB — HIV ANTIBODY (ROUTINE TESTING W REFLEX): HIV Screen 4th Generation wRfx: NONREACTIVE

## 2018-09-08 MED ORDER — ALTEPLASE 2 MG IJ SOLR
2.0000 mg | Freq: Once | INTRAMUSCULAR | Status: DC | PRN
  Filled 2018-09-08: qty 2

## 2018-09-08 MED ORDER — RENA-VITE PO TABS
1.0000 | ORAL_TABLET | Freq: Every day | ORAL | Status: DC
  Administered 2018-09-09 – 2018-09-10 (×3): 1 via ORAL
  Filled 2018-09-08 (×3): qty 1

## 2018-09-08 MED ORDER — LORAZEPAM 2 MG/ML IJ SOLN
1.0000 mg | Freq: Once | INTRAMUSCULAR | Status: AC
  Administered 2018-09-08: 1 mg via INTRAVENOUS

## 2018-09-08 MED ORDER — DARBEPOETIN ALFA 60 MCG/0.3ML IJ SOSY: 60.0000 ug | PREFILLED_SYRINGE | INTRAMUSCULAR | Status: DC

## 2018-09-08 MED ORDER — LORAZEPAM 2 MG/ML IJ SOLN
1.0000 mg | Freq: Once | INTRAMUSCULAR | Status: AC
  Administered 2018-09-08: 1 mg via INTRAVENOUS
  Filled 2018-09-08: qty 1

## 2018-09-08 MED ORDER — LIDOCAINE HCL (PF) 1 % IJ SOLN: 5.0000 mL | INTRAMUSCULAR | Status: DC | PRN

## 2018-09-08 MED ORDER — SODIUM CHLORIDE 0.9 % IV SOLN: 100.0000 mL | INTRAVENOUS | Status: DC | PRN

## 2018-09-08 MED ORDER — LORAZEPAM 2 MG/ML IJ SOLN
INTRAMUSCULAR | Status: AC
  Filled 2018-09-08: qty 1

## 2018-09-08 MED ORDER — LIDOCAINE-PRILOCAINE 2.5-2.5 % EX CREA: 1.0000 "application " | TOPICAL_CREAM | CUTANEOUS | Status: DC | PRN

## 2018-09-08 MED ORDER — DARBEPOETIN ALFA 60 MCG/0.3ML IJ SOSY
PREFILLED_SYRINGE | INTRAMUSCULAR | Status: AC
  Administered 2018-09-08: 60 ug
  Filled 2018-09-08: qty 0.3

## 2018-09-08 MED ORDER — PENTAFLUOROPROP-TETRAFLUOROETH EX AERO: 1.0000 "application " | INHALATION_SPRAY | CUTANEOUS | Status: DC | PRN

## 2018-09-08 MED ORDER — CHLORHEXIDINE GLUCONATE CLOTH 2 % EX PADS
6.0000 | MEDICATED_PAD | Freq: Every day | CUTANEOUS | Status: DC
  Administered 2018-09-09: 6 via TOPICAL

## 2018-09-08 MED ORDER — HEPARIN SODIUM (PORCINE) 1000 UNIT/ML DIALYSIS
1000.0000 [IU] | INTRAMUSCULAR | Status: DC | PRN
  Filled 2018-09-08: qty 1

## 2018-09-08 MED ORDER — CALCITRIOL 0.5 MCG PO CAPS
1.7500 ug | ORAL_CAPSULE | ORAL | Status: DC
  Administered 2018-09-10: 1.75 ug via ORAL
  Filled 2018-09-08 (×2): qty 1

## 2018-09-08 NOTE — Progress Notes (Signed)
Neurology Progress Note   S:// Patient seen and examined.  Continues to have dyskinetic and myoclonic movements.   O:// Current vital signs: BP (!) 144/82 (BP Location: Right Arm)   Pulse 99   Temp 98 F (36.7 C) (Oral)   Resp (!) 22   Ht _0  (1.651 m)   Wt 68.3 kg   SpO2 96%   BMI 25.06 kg/m  Vital signs in last 24 hours: Temp:  [98 F (36.7 C)-98.4 F (36.9 C)] 98 F (36.7 C) (11/07 0742) Pulse Rate:  [90-102] 99 (11/07 0742) Resp:  [14-24] 22 (11/07 0742) BP: (144-199)/(82-110) 144/82 (11/07 0742) SpO2:  [91 %-100 %] 96 % (11/07 0742) Weight:  [66 kg-68.3 kg] 68.3 kg (11/07 0259) General: Generally sick looking man, laying in bed with movements as described below in some distress. HEENT: Normocephalic atraumatic CVS: W3-S9 heard regular rate rhythm Chest: Intermittently labored, equal air entry Neurological exam Mental status: Patient is awake, appears alert but does not seem to follow commands consistently.  He is slow to respond to questions and is unable to answer questions coherently.  His speech is mildly dysarthric. Cranial nerves: Pupils appear equal round reactive bilaterally, blinks to threat from both sides, extraocular movements seem intact based on her suit of the examiner, gaze is conjugate, face is symmetric with very frequent dyskinetic movements. Motor exam: He is antigravity in all 4 extremities and has constant choreoathetoid movements of the upper and lower extremities with variable amplitude and no regularity.  Episodes are not stereotypical.  He also has asterixis on outstretched arms and generalized body myoclonus on top of the choreoathetoid movements Per Dr. Yvetta Coder handoff, his choreoathetoid movements subside and sleep, which I did not observe but his myoclonic jerky-like movements and asterixis continues even in sleep. Sensory exam: Reacts to noxious stimulation equally in all fours Cerebellar: Extremely ataxic with the ongoing choreoathetoid  movements and myoclonus DTRs: Hyperreflexic all over with positive crossed adductor bilaterally Plantars downgoing bilaterally Did not assess gait for patient safety   Medications  Current Facility-Administered Medications:  .  0.9 %  sodium chloride infusion, 250 mL, Intravenous, PRN, Opyd, Ilene Qua, MD, Last Rate: 10 mL/hr at 09/07/18 2323, 250 mL at 09/07/18 2323 .  amLODipine (NORVASC) tablet 10 mg, 10 mg, Oral, QHS, Opyd, Ilene Qua, MD, 10 mg at 09/07/18 2209 .  cloNIDine (CATAPRES) tablet 0.1 mg, 0.1 mg, Oral, BID, Opyd, Ilene Qua, MD, 0.1 mg at 09/07/18 2209 .  fentaNYL (SUBLIMAZE) injection 12.5-25 mcg, 12.5-25 mcg, Intravenous, Q2H PRN, Opyd, Timothy S, MD .  heparin injection 5,000 Units, 5,000 Units, Subcutaneous, Q8H, Opyd, Ilene Qua, MD, 5,000 Units at 09/08/18 0604 .  labetalol (NORMODYNE,TRANDATE) injection 10 mg, 10 mg, Intravenous, Q2H PRN, Opyd, Ilene Qua, MD, 10 mg at 09/08/18 0135 .  losartan (COZAAR) tablet 100 mg, 100 mg, Oral, Daily, Opyd, Timothy S, MD .  ondansetron (ZOFRAN) tablet 4 mg, 4 mg, Oral, Q6H PRN **OR** ondansetron (ZOFRAN) injection 4 mg, 4 mg, Intravenous, Q6H PRN, Opyd, Ilene Qua, MD .  sevelamer carbonate (RENVELA) tablet 3,200 mg, 3,200 mg, Oral, TID WC, Opyd, Timothy S, MD .  sodium chloride flush (NS) 0.9 % injection 3 mL, 3 mL, Intravenous, Q12H, Opyd, Ilene Qua, MD, 3 mL at 09/07/18 2129 .  sodium chloride flush (NS) 0.9 % injection 3 mL, 3 mL, Intravenous, Q12H, Opyd, Ilene Qua, MD, 3 mL at 09/07/18 2129 .  sodium chloride flush (NS) 0.9 % injection 3 mL, 3 mL, Intravenous,  PRN, Vianne Bulls, MD .  Derrill Memo ON 09/11/2018] thiamine (B-1) injection 250 mg, 250 mg, Intravenous, TID, Opyd, Ilene Qua, MD .  Derrill Memo ON 09/14/2018] thiamine (VITAMIN B-1) tablet 100 mg, 100 mg, Oral, Daily, Opyd, Timothy S, MD .  thiamine 572m in normal saline (575m IVPB, 500 mg, Intravenous, TID, Opyd, TiIlene QuaMD, Last Rate: 100 mL/hr at 09/07/18 2331, 500 mg at  09/07/18 2331 .  vitamin B-12 (CYANOCOBALAMIN) tablet 2,000 mcg, 2,000 mcg, Oral, Daily, LiKerney ElbeMD, 2,000 mcg at 09/07/18 2324 Labs   BUN 96, creatinine 19.1. CBC    Component Value Date/Time   WBC 3.6 (L) 09/08/2018 0606   RBC 2.79 (L) 09/08/2018 0606   HGB 9.2 (L) 09/08/2018 0606   HCT 29.1 (L) 09/08/2018 0606   PLT 169 09/08/2018 0606   MCV 104.3 (H) 09/08/2018 0606   MCH 33.0 09/08/2018 0606   MCHC 31.6 09/08/2018 0606   RDW 13.3 09/08/2018 0606   LYMPHSABS 1.1 09/07/2018 1819   MONOABS 0.5 09/07/2018 1819   EOSABS 0.1 09/07/2018 1819   BASOSABS 0.0 09/07/2018 1819    CMP     Component Value Date/Time   NA 140 09/08/2018 0606   NA 146 (H) 05/16/2018 0830   K 4.9 09/08/2018 0606   CL 96 (L) 09/08/2018 0606   CO2 24 09/08/2018 0606   GLUCOSE 95 09/08/2018 0606   BUN 96 (H) 09/08/2018 0606   BUN 49 (H) 05/16/2018 0830   CREATININE 19.10 (H) 09/08/2018 0606   CALCIUM 8.3 (L) 09/08/2018 0606   PROT 7.6 09/07/2018 1819   PROT 7.1 05/16/2018 0830   ALBUMIN 3.9 09/07/2018 1819   ALBUMIN 4.2 05/16/2018 0830   AST 28 09/07/2018 1819   ALT 31 09/07/2018 1819   ALKPHOS 77 09/07/2018 1819   BILITOT 0.7 09/07/2018 1819   BILITOT 0.3 05/16/2018 0830   GFRNONAA 2 (L) 09/08/2018 0606   GFRAA 3 (L) 09/08/2018 0606   ESR 92   Imaging I have reviewed images in epic and the results pertinent to this consultation are: MRI of the brain 09/06/2018 including thin slices of the brain negative for any acute stroke or any other acute abnormality.  Assessment:  [age][facility] 2 or 3 days ago.  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 The differentials include: -Uremic encephalopathy causing generalized choreiform movements and  asterixis -Toxic metabolic encephalopathy - Nutrient/vitamin deficiency including thiamine deficiency causing ataxia and confusion - Infection-?Unknown source - Consider rare and uncommon etiologies such as autoimmune encephalitis if symptoms do not improve with dialysis and correction of toxic metabolic derangements.  Recommendations: Continue with thiamine and B12 supplementation. Hemodialysis per renal service If continues to have these movements even with correction of toxic metabolic derangements, will consider obtaining a spinal tap for more diagnostic information. Check for underlying infections including UTI, pneumonia.  -- AsAmie PortlandMD [facility] Pager: [phone] 7pm to 7am, please call on call as listed on AMION.
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
## 2018-09-08 NOTE — Progress Notes (Signed)
Jerking movements continue, pt continues to be high fall risk. Seizure pads placed and bed padded, as well as floor mats placed for safety, will continue to monitor. Maurice Bell

## 2018-09-08 NOTE — Progress Notes (Signed)
Pt continues to have uncontrolled jerking movements of arms and legs, abnormal movement of facial muscles, moving around bed, pt rolling towards edge of bed, found several times with legs hanging off of bed, [facility] provider notified, orders received.Maurice Bell

## 2018-09-08 NOTE — Progress Notes (Signed)
More agitated when seen earlier.  Rolling around in bed. Grimacing, myoclonus.  Unable to safely cannulate.  Given 1 mg IV ativan and then will try to initiate dialysis. May need to given additional sedation.  Amalia Hailey, PA-C
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
## 2018-09-08 NOTE — Procedures (Signed)
I was present at this dialysis session. I have reviewed the session itself and made appropriate changes.   Req ativan during HD for safety given his agitation.  Goal 3L.  2K bath. Cannuluaion of AVF uneventful, AP stable. Follow post HD labs.   Filed Weights   09/07/18 1724 09/07/18 2230 09/08/18 0259  Weight: 66 kg 67.7 kg 68.3 kg    Recent Labs  Lab 09/07/18 2249  09/08/18 0606  NA  --   --  140  K  --    < > 4.9  CL  --   --  96*  CO2  --   --  24  GLUCOSE  --   --  95  BUN  --   --  96*  CREATININE  --   --  19.10*  CALCIUM  --   --  8.3*  PHOS 8.5*  --   --    < > = values in this interval not displayed.    Recent Labs  Lab 09/06/18 0206 09/06/18 0215 09/07/18 1819 09/08/18 0606  WBC 4.7  --  4.0 3.6*  NEUTROABS 2.3  --  2.3  --   HGB 10.4* 10.2* 10.1* 9.2*  HCT 34.1* 30.0* 32.6* 29.1*  MCV 108.9*  --  106.9* 104.3*  PLT 204  --  187 169    Scheduled Meds: . amLODipine  10 mg Oral QHS  . calcitRIOL  1.75 mcg Oral Q T,Th,Sa-HD  . Chlorhexidine Gluconate Cloth  6 each Topical Q0600  . cloNIDine  0.1 mg Oral BID  . darbepoetin (ARANESP) injection - DIALYSIS  60 mcg Intravenous Q Thu-HD  . heparin  5,000 Units Subcutaneous Q8H  . LORazepam      . losartan  100 mg Oral Daily  . multivitamin  1 tablet Oral QHS  . sevelamer carbonate  3,200 mg Oral TID WC  . sodium chloride flush  3 mL Intravenous Q12H  . sodium chloride flush  3 mL Intravenous Q12H  . [START ON 09/11/2018] thiamine injection  250 mg Intravenous TID  . [START ON 09/14/2018] thiamine  100 mg Oral Daily  . vitamin B-12  2,000 mcg Oral Daily   Continuous Infusions: . sodium chloride 250 mL (09/07/18 2323)  . sodium chloride    . sodium chloride    . thiamine injection 500 mg (09/08/18 1053)   PRN Meds:.sodium chloride, sodium chloride, sodium chloride, alteplase, fentaNYL (SUBLIMAZE) injection, heparin, labetalol, lidocaine (PF), lidocaine-prilocaine, ondansetron **OR** ondansetron (ZOFRAN)  IV, pentafluoroprop-tetrafluoroeth, sodium chloride flush   Pearson Grippe  MD 09/08/2018, 3:37 PM

## 2018-09-09 LAB — BASIC METABOLIC PANEL
ANION GAP: 16 — AB (ref 5–15)
BUN: 29 mg/dL — ABNORMAL HIGH (ref 6–20)
CALCIUM: 8.9 mg/dL (ref 8.9–10.3)
CO2: 26 mmol/L (ref 22–32)
Chloride: 96 mmol/L — ABNORMAL LOW (ref 98–111)
Creatinine, Ser: 10.88 mg/dL — ABNORMAL HIGH (ref 0.61–1.24)
GFR calc Af Amer: 5 mL/min — ABNORMAL LOW (ref >60)
GFR calc non Af Amer: 4 mL/min — ABNORMAL LOW (ref >60)
Glucose, Bld: 67 mg/dL — ABNORMAL LOW (ref 70–99)
Potassium: 3.9 mmol/L (ref 3.5–5.1)
Sodium: 138 mmol/L (ref 135–145)

## 2018-09-09 LAB — POTASSIUM
POTASSIUM: 4.3 mmol/L (ref 3.5–5.1)
POTASSIUM: 4.1 mmol/L (ref 3.5–5.1)
POTASSIUM: 4 mmol/L (ref 3.5–5.1)
Potassium: 4.3 mmol/L (ref 3.5–5.1)
Potassium: 4 mmol/L (ref 3.5–5.1)

## 2018-09-09 LAB — PTH, INTACT AND CALCIUM
Calcium, Total (PTH): 9.1 mg/dL (ref 8.7–10.2)
PTH: 410 pg/mL — ABNORMAL HIGH (ref 15–65)

## 2018-09-09 LAB — COPPER, SERUM: Copper: 97 ug/dL (ref 72–166)

## 2018-09-09 LAB — HEAVY METALS, BLOOD
Arsenic: 8 ug/L (ref 2–23)
LEAD: 3 ug/dL (ref 0–4)
MERCURY: NOT DETECTED ug/L (ref 0.0–14.9)

## 2018-09-09 LAB — CERULOPLASMIN: Ceruloplasmin: 19 mg/dL (ref 16.0–31.0)

## 2018-09-09 LAB — RPR: RPR Ser Ql: NONREACTIVE

## 2018-09-09 MED ORDER — CHLORHEXIDINE GLUCONATE CLOTH 2 % EX PADS
6.0000 | MEDICATED_PAD | Freq: Every day | CUTANEOUS | Status: DC
  Administered 2018-09-10 – 2018-09-11 (×2): 6 via TOPICAL

## 2018-09-09 MED ORDER — NEPRO/CARBSTEADY PO LIQD
237.0000 mL | Freq: Two times a day (BID) | ORAL | Status: DC
  Administered 2018-09-09 – 2018-09-11 (×4): 237 mL via ORAL
  Filled 2018-09-09 (×5): qty 237

## 2018-09-09 MED ORDER — LABETALOL HCL 5 MG/ML IV SOLN: 10.0000 mg | INTRAVENOUS | Status: DC | PRN
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
## 2018-09-09 NOTE — Care Management Note (Signed)
Case Management Note  Patient Details  Name: Neville Walston MRN: [id number] Date of Birth: 1956-04-22  Subjective/Objective:   Patient admitted with acute encephalopathy. He is from home.  Pt is ESRD.                 Action/Plan: MD please place orders for PT/OT evals as needed. CM following for d/c needs, physician orders.   Expected Discharge Date:                  Expected Discharge Plan:     In-House Referral:     Discharge planning Services     Post Acute Care Choice:    Choice offered to:     DME Arranged:    DME Agency:     HH Arranged:    HH Agency:     Status of Service:  In process, will continue to follow  If discussed at Long Length of Stay Meetings, dates discussed:    Additional Comments:  Pollie Friar, RN 09/09/2018, 4:12 PM

## 2018-09-09 NOTE — Progress Notes (Signed)
Initial Nutrition Assessment  DOCUMENTATION CODES:   Not applicable  INTERVENTION:  Provide Nepro Shake po BID, each supplement provides 425 kcal and 19 grams protein.  Encourage adequate PO intake.   NUTRITION DIAGNOSIS:   Increased nutrient needs related to chronic illness(ESRD HD) as evidenced by estimated needs.  GOAL:   Patient will meet greater than or equal to 90% of their needs  MONITOR:   PO intake, Supplement acceptance, Labs, Weight trends, Skin, I & O's  REASON FOR ASSESSMENT:   Malnutrition Screening Tool    ASSESSMENT:   62 year old male with ESRD, TTS, anxiety, hepatitis C, hypertension, prior history of CVA presented to ED with speech difficulty and uncontrolled jerking movements. Patient went to dialysis on Tuesday 11/5 however was found to be unsteady with dysarthria, facial drooping and was sent to ED however work-up was negative. patient returned to ED on 11/6 with speech difficulty and uncontrolled myoclonus and tremors.  Pt unavailable during attempted time of visit. RD unable to obtain most recent nutrition history. RD to order nutritional supplements to aid in caloric and protein needs. No recent meal completion recorded. Weight fluctuating however most stable per weight records, likely related to fluid status.   Unable to complete Nutrition-Focused physical exam at this time. RD to perform physical exam at next visit.   Labs and medications reviewed.   Diet Order:   Diet Order            Diet renal with fluid restriction Fluid restriction: 1200 mL Fluid; Room service appropriate? Yes; Fluid consistency: Thin  Diet effective now              EDUCATION NEEDS:   Not appropriate for education at this time  Skin:  Skin Assessment: Reviewed RN Assessment  Last BM:  11/6  Height:   Ht Readings from Last 1 Encounters:  09/07/18 5\' 5"  (1.651 m)    Weight:   Wt Readings from Last 1 Encounters:  09/08/18 65.6 kg    Ideal Body Weight:   61.8 kg  BMI:  Body mass index is 24.07 kg/m.  Estimated Nutritional Needs:   Kcal:  1900-2100  Protein:  90-100 grams  Fluid:  1.2 L/day    Corrin Parker, MS, RD, LDN Pager # [phone] After hours/ weekend pager # [phone]

## 2018-09-09 NOTE — Progress Notes (Signed)
Neurology Progress Note   S:// Seen and examiined. Movements nearly subsided. Had HD yesterday  O:// Current vital signs: BP (!) 142/90 (BP Location: Right Arm)   Pulse (!) 103   Temp 98.9 F (37.2 C) (Axillary)   Resp 11   Ht 5\' 5"  (1.651 m)   Wt 65.6 kg   SpO2 100%   BMI 24.07 kg/m  Vital signs in last 24 hours: Temp:  [97.4 F (36.3 C)-98.9 F (37.2 C)] 98.9 F (37.2 C) (11/08 0820) Pulse Rate:  [94-107] 103 (11/08 0820) Resp:  [11-25] 11 (11/08 0820) BP: (102-190)/(56-113) 142/90 (11/08 0820) SpO2:  [98 %-100 %] 100 % (11/08 0820) Weight:  [65.6 kg-68.4 kg] 65.6 kg (11/07 1745) GENERAL: Awake, alert in NAD HEENT: - Normocephalic and atraumatic, dry mm, no LN++, no Thyromegally LUNGS - Clear to auscultation bilaterally with no wheezes CV - S1S2 RRR, no m/r/g, equal pulses bilaterally. ABDOMEN - Soft, nontender, nondistended with normoactive BS Ext: warm, well perfused, intact peripheral pulses,  No edema NEURO:  Mental Status: AA&Ox2 Language: speech is clear.  Naming, repetition, fluency, and comprehension intact. Attention concentration is poor Cranial Nerves: PERRL . EOMI, visual fields full, no facial asymmetry, facial sensation intact, hearing intact, tongue/uvula/soft palate midline, normal sternocleidomastoid and trapezius muscle strength. No evidence of tongue atrophy or fibrillations Motor: 5/5 antigravity in all 4s. Mild asterixis on outstretched arms. Tone: is normal and bulk is normal Sensation- Intact to light touch bilaterally Coordination: FTN intact  Gait- deferred  Medications  Current Facility-Administered Medications:  .  0.9 %  sodium chloride infusion, 250 mL, Intravenous, PRN, Opyd, Ilene Qua, MD, Last Rate: 10 mL/hr at 09/07/18 2323, 250 mL at 09/07/18 2323 .  amLODipine (NORVASC) tablet 10 mg, 10 mg, Oral, QHS, Opyd, Ilene Qua, MD, 10 mg at 09/09/18 0022 .  calcitRIOL (ROCALTROL) capsule 1.75 mcg, 1.75 mcg, Oral, Q T,Th,Sa-HD, Alric Seton, PA-C .  Chlorhexidine Gluconate Cloth 2 % PADS 6 each, 6 each, Topical, Q0600, Alric Seton, PA-C, 6 each at 09/09/18 [id number] .  cloNIDine (CATAPRES) tablet 0.1 mg, 0.1 mg, Oral, BID, Opyd, Ilene Qua, MD, 0.1 mg at 09/09/18 0851 .  Darbepoetin Alfa (ARANESP) injection 60 mcg, 60 mcg, Intravenous, Q Thu-HD, Bergman, Martha, PA-C .  fentaNYL (SUBLIMAZE) injection 12.5-25 mcg, 12.5-25 mcg, Intravenous, Q2H PRN, Opyd, Timothy S, MD .  heparin injection 5,000 Units, 5,000 Units, Subcutaneous, Q8H, Opyd, Ilene Qua, MD, 5,000 Units at 09/09/18 0651 .  labetalol (NORMODYNE,TRANDATE) injection 10 mg, 10 mg, Intravenous, Q2H PRN, Opyd, Ilene Qua, MD, 10 mg at 09/08/18 0135 .  losartan (COZAAR) tablet 100 mg, 100 mg, Oral, Daily, Opyd, Ilene Qua, MD, 100 mg at 09/09/18 0851 .  multivitamin (RENA-VIT) tablet 1 tablet, 1 tablet, Oral, QHS, Alric Seton, PA-C, 1 tablet at 09/09/18 0022 .  ondansetron (ZOFRAN) tablet 4 mg, 4 mg, Oral, Q6H PRN **OR** ondansetron (ZOFRAN) injection 4 mg, 4 mg, Intravenous, Q6H PRN, Opyd, Timothy S, MD .  sevelamer carbonate (RENVELA) tablet 3,200 mg, 3,200 mg, Oral, TID WC, Opyd, Ilene Qua, MD, 3,200 mg at 09/09/18 0850 .  sodium chloride flush (NS) 0.9 % injection 3 mL, 3 mL, Intravenous, Q12H, Opyd, Ilene Qua, MD, 3 mL at 09/09/18 0019 .  sodium chloride flush (NS) 0.9 % injection 3 mL, 3 mL, Intravenous, Q12H, Opyd, Ilene Qua, MD, 3 mL at 09/07/18 2129 .  sodium chloride flush (NS) 0.9 % injection 3 mL, 3 mL, Intravenous, PRN, Opyd, Ilene Qua, MD .  Derrill Memo  ON 09/11/2018] thiamine (B-1) injection 250 mg, 250 mg, Intravenous, TID, Opyd, Ilene Qua, MD .  Derrill Memo ON 09/14/2018] thiamine (VITAMIN B-1) tablet 100 mg, 100 mg, Oral, Daily, Opyd, Timothy S, MD .  thiamine 500mg  in normal saline (2ml) IVPB, 500 mg, Intravenous, TID, Opyd, Ilene Qua, MD, Last Rate: 100 mL/hr at 09/09/18 0400, 500 mg at 09/09/18 0400 .  vitamin B-12 (CYANOCOBALAMIN) tablet 2,000 mcg, 2,000 mcg,  Oral, Daily, Kerney Elbe, MD, 2,000 mcg at 09/09/18 0850 Labs CBC    Component Value Date/Time   WBC 3.6 (L) 09/08/2018 0606   RBC 2.79 (L) 09/08/2018 0606   HGB 9.2 (L) 09/08/2018 0606   HCT 29.1 (L) 09/08/2018 0606   PLT 169 09/08/2018 0606   MCV 104.3 (H) 09/08/2018 0606   MCH 33.0 09/08/2018 0606   MCHC 31.6 09/08/2018 0606   RDW 13.3 09/08/2018 0606   LYMPHSABS 1.1 09/07/2018 1819   MONOABS 0.5 09/07/2018 1819   EOSABS 0.1 09/07/2018 1819   BASOSABS 0.0 09/07/2018 1819    CMP     Component Value Date/Time   NA 138 09/09/2018 0417   NA 146 (H) 05/16/2018 0830   K 4.0 09/09/2018 0726   CL 96 (L) 09/09/2018 0417   CO2 26 09/09/2018 0417   GLUCOSE 67 (L) 09/09/2018 0417   BUN 29 (H) 09/09/2018 0417   BUN 49 (H) 05/16/2018 0830   CREATININE 10.88 (H) 09/09/2018 0417   CALCIUM 8.9 09/09/2018 0417   CALCIUM 9.1 09/07/2018 2249   PROT 7.5 09/08/2018 1431   PROT 7.1 05/16/2018 0830   ALBUMIN 3.7 09/08/2018 1431   ALBUMIN 4.2 05/16/2018 0830   AST 30 09/08/2018 1431   ALT 28 09/08/2018 1431   ALKPHOS 67 09/08/2018 1431   BILITOT 0.6 09/08/2018 1431   BILITOT 0.3 05/16/2018 0830   GFRNONAA 4 (L) 09/09/2018 0417   GFRAA 5 (L) 09/09/2018 0417   Imaging I have reviewed images in epic and the results pertinent to this consultation are: MRI examination of the brain - 11/5 - NAD  Assessment:  61/M with worsening choreoathetoid movements in the setting of uremia from missed dialysis. Much improved after HD. Less likely encephalitis-will defer LP for now. Will reconsider autoimmune etiology if symptoms recur inspite of correcting other reversible causes. Other differentials to consider would be heavy metal toxicity.  Recommendations: Continue B12 supplementation HD per renal/primary Correct toxic metabolic derangements as you are Check heavy metal screen Neurology will be available as needed.  -- Amie Portland, MD [facility] Pager: [phone] If 7pm  to 7am, please call on call as listed on AMION.

## 2018-09-09 NOTE — Progress Notes (Signed)
[facility] Progress Note   Subjective:   Seen in room this morning.  He is intermittently writhing and yelling out a grunting noise.   He doesn't recall events prior to transfer.  Says he cannot speak at times.  He denies any ingestions  Objective Vitals:   09/08/18 1950 09/09/18 0042 09/09/18 0341 09/09/18 0820  BP: (!) 174/87 (!) 166/94 (!) 152/94 (!) 142/90  Pulse:    (!) 103  Resp:  20  11  Temp: 98 F (36.7 C) 98 F (36.7 C) (!) 97.4 F (36.3 C) 98.9 F (37.2 C)  TempSrc: Oral Axillary Axillary Axillary  SpO2:  100%  100%  Weight:      Height:       Physical Exam General: lying in bed, no distress. Seems to fall asleep intermittently.  Heart: RRR, no rub Lungs: normal WOB Abdomen: soft, nontender  Extremities: no edema Neuro:  Lying still and about every 1-2 minutes raises arms over head and lets out a loud yell.  He tells me his name, hospital, not year but then appears to not be able to speak intermittently Dialysis Access:  L forearm access T/B  Additional Objective Labs: Basic Metabolic Panel: Recent Labs  Lab 09/07/18 1819 09/07/18 2249  09/08/18 0606 09/09/18 0023 09/09/18 0417 09/09/18 0726  NA 137  --   --  140  --  138  --   K 6.1*  --    < > 4.9 4.3 3.9 4.0  CL 94*  --   --  96*  --  96*  --   CO2 26  --   --  24  --  26  --   GLUCOSE 85  --   --  95  --  67*  --   BUN 86*  --   --  96*  --  29*  --   CREATININE 17.72*  --   --  19.10*  --  10.88*  --   CALCIUM 8.7* 9.1  --  8.3*  --  8.9  --   PHOS  --  8.5*  --   --   --   --   --    < > = values in this interval not displayed.   Liver Function Tests: Recent Labs  Lab 09/06/18 0206 09/07/18 1819 09/08/18 1431  AST 38 28 30  ALT 36 31 28  ALKPHOS 95 77 67  BILITOT 0.4 0.7 0.6  PROT 7.6 7.6 7.5  ALBUMIN 3.8 3.9 3.7   No results for input(s): LIPASE, AMYLASE in the last 168 hours. CBC: Recent Labs  Lab 09/06/18 0206 09/06/18 0215 09/07/18 1819 09/08/18 0606  WBC  4.7  --  4.0 3.6*  NEUTROABS 2.3  --  2.3  --   HGB 10.4* 10.2* 10.1* 9.2*  HCT 34.1* 30.0* 32.6* 29.1*  MCV 108.9*  --  106.9* 104.3*  PLT 204  --  187 169   Blood Culture No results found for: SDES, SPECREQUEST, CULT, REPTSTATUS  Cardiac Enzymes: No results for input(s): CKTOTAL, CKMB, CKMBINDEX, TROPONINI in the last 168 hours. CBG: Recent Labs  Lab 09/08/18 1525  GLUCAP 114*   Iron Studies: No results for input(s): IRON, TIBC, TRANSFERRIN, FERRITIN in the last 72 hours. @lablastinr3 @ Studies/Results: No results found. Medications: . sodium chloride 250 mL (09/07/18 2323)  . thiamine injection 500 mg (09/09/18 0400)   . amLODipine  10 mg Oral QHS  . calcitRIOL  1.75 mcg Oral Q T,Th,Sa-HD  .  Chlorhexidine Gluconate Cloth  6 each Topical Q0600  . cloNIDine  0.1 mg Oral BID  . darbepoetin (ARANESP) injection - DIALYSIS  60 mcg Intravenous Q Thu-HD  . heparin  5,000 Units Subcutaneous Q8H  . losartan  100 mg Oral Daily  . multivitamin  1 tablet Oral QHS  . sevelamer carbonate  3,200 mg Oral TID WC  . sodium chloride flush  3 mL Intravenous Q12H  . sodium chloride flush  3 mL Intravenous Q12H  . [START ON 09/11/2018] thiamine injection  250 mg Intravenous TID  . [START ON 09/14/2018] thiamine  100 mg Oral Daily  . vitamin B-12  2,000 mcg Oral Daily    Dialysis Orders:  [facility] 3.75 hours 1 K 2.5 Ca EDW 65 400/800 left lower AVF no heparin Aranesp 50 calcitriol 1.75  Recent labs: hgb 8.5 10/31 37% sat - needs more Aranesp - AF 172 - 11/2 - down corr Ca 9.2 P 7.6 iPTH 1016  Assessment/Plan:  1. Acute metabolic encephalopathy with movement disorder:  Still having significant neurologic issues (speech, movement, orientation, lethargy) on my exam (1st time meeting him but appears last week was normal).  UDS ordered - I don't see it pending.  BUN this morning is 29 and I don't think this is uremia though it may have been contributing some to movements seen yesterday.  In  setting of what appears to be involuntary large movements I'm going to hold on HD today and do tomorrow per his TTS schedule.  Neurology is following - plan to hold on LP at this time.  Heavy metal screen pending.  Ammonia, imaging normal.  On B12, thiamine normal.  2. ESRD -  TTS - HD yesterday completed.  BUN 29 this AM.  Plan HD tomorrow AM.  May need sitter for treatment - d/w floor RN this AM.    3. Hypertension/volume  - current meds - lower volume with HD - EDW 65 4. Anemia  - hgb 9.2 - continue ESA - increase to ARanesp 60 from 50  5.  Metabolic bone disease -  Continue VDRA/binders Calcium in the 8s this is not low P elevated - will address when eating and talking  6.  Nutrition -renal diet - not eating at present 7.  Hepatitis C +  8.  Hx CVA  Jannifer Hick MD 09/09/2018, 9:48 AM  [facility] Pager: [phone]

## 2018-09-10 ENCOUNTER — Inpatient Hospital Stay (HOSPITAL_COMMUNITY): Payer: Medicare Other

## 2018-09-10 DIAGNOSIS — G259 Extrapyramidal and movement disorder, unspecified: Principal | ICD-10-CM

## 2018-09-10 LAB — BASIC METABOLIC PANEL
ANION GAP: 14 (ref 5–15)
BUN: 43 mg/dL — AB (ref 6–20)
CALCIUM: 8.9 mg/dL (ref 8.9–10.3)
CO2: 28 mmol/L (ref 22–32)
CREATININE: 13.78 mg/dL — AB (ref 0.61–1.24)
Chloride: 95 mmol/L — ABNORMAL LOW (ref 98–111)
GFR calc Af Amer: 4 mL/min — ABNORMAL LOW (ref >60)
GFR calc non Af Amer: 3 mL/min — ABNORMAL LOW (ref >60)
GLUCOSE: 104 mg/dL — AB (ref 70–99)
Potassium: 3.8 mmol/L (ref 3.5–5.1)
Sodium: 137 mmol/L (ref 135–145)

## 2018-09-10 LAB — RENAL FUNCTION PANEL
ANION GAP: 14 (ref 5–15)
Albumin: 3.3 g/dL — ABNORMAL LOW (ref 3.5–5.0)
BUN: 47 mg/dL — AB (ref 6–20)
CALCIUM: 8.7 mg/dL — AB (ref 8.9–10.3)
CO2: 27 mmol/L (ref 22–32)
Chloride: 94 mmol/L — ABNORMAL LOW (ref 98–111)
Creatinine, Ser: 14.62 mg/dL — ABNORMAL HIGH (ref 0.61–1.24)
GFR calc Af Amer: 4 mL/min — ABNORMAL LOW (ref >60)
GFR calc non Af Amer: 3 mL/min — ABNORMAL LOW (ref >60)
GLUCOSE: 93 mg/dL (ref 70–99)
Phosphorus: 8.2 mg/dL — ABNORMAL HIGH (ref 2.5–4.6)
Potassium: 4.3 mmol/L (ref 3.5–5.1)
Sodium: 135 mmol/L (ref 135–145)

## 2018-09-10 LAB — CBC
HCT: 30.2 % — ABNORMAL LOW (ref 39.0–52.0)
HEMOGLOBIN: 9.8 g/dL — AB (ref 13.0–17.0)
MCH: 34.5 pg — AB (ref 26.0–34.0)
MCHC: 32.5 g/dL (ref 30.0–36.0)
MCV: 106.3 fL — AB (ref 80.0–100.0)
Platelets: 158 10*3/uL (ref 150–400)
RBC: 2.84 MIL/uL — AB (ref 4.22–5.81)
RDW: 13.5 % (ref 11.5–15.5)
WBC: 4.2 10*3/uL (ref 4.0–10.5)
nRBC: 0 % (ref 0.0–0.2)

## 2018-09-10 LAB — POTASSIUM
POTASSIUM: 3.8 mmol/L (ref 3.5–5.1)
POTASSIUM: 4.2 mmol/L (ref 3.5–5.1)
Potassium: 3.8 mmol/L (ref 3.5–5.1)
Potassium: 3.4 mmol/L — ABNORMAL LOW (ref 3.5–5.1)

## 2018-09-10 MED ORDER — PENTAFLUOROPROP-TETRAFLUOROETH EX AERO: 1.0000 "application " | INHALATION_SPRAY | CUTANEOUS | Status: DC | PRN

## 2018-09-10 MED ORDER — ROPINIROLE HCL 1 MG PO TABS: 1.0000 mg | ORAL_TABLET | Freq: Every day | ORAL | Status: DC

## 2018-09-10 MED ORDER — LIDOCAINE HCL (PF) 1 % IJ SOLN: 5.0000 mL | INTRAMUSCULAR | Status: DC | PRN

## 2018-09-10 MED ORDER — ROPINIROLE HCL 0.25 MG PO TABS: 0.2500 mg | ORAL_TABLET | Freq: Every day | ORAL | Status: DC

## 2018-09-10 MED ORDER — ROPINIROLE HCL 1 MG PO TABS
0.5000 mg | ORAL_TABLET | Freq: Every day | ORAL | Status: DC
  Administered 2018-09-10 – 2018-09-11 (×2): 0.5 mg via ORAL
  Filled 2018-09-10 (×2): qty 1

## 2018-09-10 MED ORDER — SODIUM CHLORIDE 0.9 % IV SOLN: 100.0000 mL | INTRAVENOUS | Status: DC | PRN

## 2018-09-10 MED ORDER — CALCITRIOL 0.5 MCG PO CAPS
ORAL_CAPSULE | ORAL | Status: AC
  Filled 2018-09-10: qty 3

## 2018-09-10 MED ORDER — CALCITRIOL 0.25 MCG PO CAPS
ORAL_CAPSULE | ORAL | Status: AC
  Filled 2018-09-10: qty 1

## 2018-09-10 MED ORDER — ALTEPLASE 2 MG IJ SOLR
2.0000 mg | Freq: Once | INTRAMUSCULAR | Status: DC | PRN
  Filled 2018-09-10: qty 2

## 2018-09-10 MED ORDER — LIDOCAINE-PRILOCAINE 2.5-2.5 % EX CREA
1.0000 "application " | TOPICAL_CREAM | CUTANEOUS | Status: DC | PRN
  Filled 2018-09-10: qty 5

## 2018-09-10 MED ORDER — HEPARIN SODIUM (PORCINE) 1000 UNIT/ML DIALYSIS
1000.0000 [IU] | INTRAMUSCULAR | Status: DC | PRN
  Filled 2018-09-10: qty 1
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
## 2018-09-10 NOTE — Progress Notes (Signed)
[facility] Progress Note   Subjective:   Much improved. Talked with me normally today no involuntary movements except RLS type leg movements.   Objective Vitals:   09/09/18 1949 09/10/18 0355 09/10/18 0800 09/10/18 0917  BP: 133/88 134/87  134/90  Pulse: 100 89  95  Resp: 16 16 19  (!) 22  Temp: 97.6 F (36.4 C) 97.7 F (36.5 C)  98.7 F (37.1 C)  TempSrc: Oral Axillary  Oral  SpO2:  100%  100%  Weight:      Height:       Physical Exam General: sitting up calmly, conversant  Heart: RRR, no rub Lungs: normal WOB Abdomen: soft, nontender  Extremities: no edema Neuro:  RLS type movements but conversant, oriented Dialysis Access:  L forearm access T/B  Additional Objective Labs: Basic Metabolic Panel: Recent Labs  Lab 09/07/18 2249  09/08/18 0606  09/09/18 0417  09/09/18 2317 09/10/18 0324 09/10/18 0824  NA  --   --  140  --  138  --   --  137  --   K  --    < > 4.9   < > 3.9   < > 3.8 3.8 3.8  CL  --   --  96*  --  96*  --   --  95*  --   CO2  --   --  24  --  26  --   --  28  --   GLUCOSE  --   --  95  --  67*  --   --  104*  --   BUN  --   --  96*  --  29*  --   --  43*  --   CREATININE  --   --  19.10*  --  10.88*  --   --  13.78*  --   CALCIUM 9.1  --  8.3*  --  8.9  --   --  8.9  --   PHOS 8.5*  --   --   --   --   --   --   --   --    < > = values in this interval not displayed.   Liver Function Tests: Recent Labs  Lab 09/06/18 0206 09/07/18 1819 09/08/18 1431  AST 38 28 30  ALT 36 31 28  ALKPHOS 95 77 67  BILITOT 0.4 0.7 0.6  PROT 7.6 7.6 7.5  ALBUMIN 3.8 3.9 3.7   No results for input(s): LIPASE, AMYLASE in the last 168 hours. CBC: Recent Labs  Lab 09/06/18 0206 09/06/18 0215 09/07/18 1819 09/08/18 0606  WBC 4.7  --  4.0 3.6*  NEUTROABS 2.3  --  2.3  --   HGB 10.4* 10.2* 10.1* 9.2*  HCT 34.1* 30.0* 32.6* 29.1*  MCV 108.9*  --  106.9* 104.3*  PLT 204  --  187 169   Blood Culture No results found for: SDES,  SPECREQUEST, CULT, REPTSTATUS  Cardiac Enzymes: No results for input(s): CKTOTAL, CKMB, CKMBINDEX, TROPONINI in the last 168 hours. CBG: Recent Labs  Lab 09/08/18 1525  GLUCAP 114*   Iron Studies: No results for input(s): IRON, TIBC, TRANSFERRIN, FERRITIN in the last 72 hours. @lablastinr3 @ Studies/Results: No results found. Medications: . sodium chloride 250 mL (09/07/18 2323)  . thiamine injection 500 mg (09/10/18 0940)   . amLODipine  10 mg Oral QHS  . calcitRIOL  1.75 mcg Oral Q T,Th,Sa-HD  . Chlorhexidine Gluconate Cloth  6  each Topical [id number]  . cloNIDine  0.1 mg Oral BID  . darbepoetin (ARANESP) injection - DIALYSIS  60 mcg Intravenous Q Thu-HD  . feeding supplement (NEPRO CARB STEADY)  237 mL Oral BID BM  . heparin  5,000 Units Subcutaneous Q8H  . losartan  100 mg Oral Daily  . multivitamin  1 tablet Oral QHS  . rOPINIRole  0.5 mg Oral Daily  . sevelamer carbonate  3,200 mg Oral TID WC  . sodium chloride flush  3 mL Intravenous Q12H  . sodium chloride flush  3 mL Intravenous Q12H  . [START ON 09/11/2018] thiamine injection  250 mg Intravenous TID  . [START ON 09/14/2018] thiamine  100 mg Oral Daily  . vitamin B-12  2,000 mcg Oral Daily    Dialysis Orders:  TTS [facility] 3.75 hours 1 K 2.5 Ca EDW 65 400/800 left lower AVF no heparin Aranesp 50 calcitriol 1.75  Recent labs: hgb 8.5 10/31 37% sat - needs more Aranesp - AF 172 - 11/2 - down corr Ca 9.2 P 7.6 iPTH 1016  Assessment/Plan:  1. Acute metabolic encephalopathy with movement disorder:  Much improved, unclear to me exact etiology.  Imaging unrevealing.  Toxic metals pending.  Maybe had some uremia contributing but I don't think that was the sole factor.  Continues to denies ingestions but admits to marijuana and viagra prior to issues.  On ropinarole for RLS now.  Neurology following.  2. ESRD -  TTS - HD today.  4K 3. Hypertension/volume  - current meds - lower volume with HD - EDW 65 4. Anemia  - hgb 9.2 on admit  - continue ESA - increase to ARanesp 60 from 50  5.  Metabolic bone disease -  Continue VDRA/binders Calcium in the 8s.  P elevated - not taking binders regularly, encouraged. renvela 4 TID AC. Continue.  6.  Nutrition -renal diet  7.  Hepatitis C +  8.  Hx CVA  Jannifer Hick MD 09/10/2018, 11:35 AM  [facility] Pager: [phone]

## 2018-09-11 LAB — POTASSIUM
POTASSIUM: 4 mmol/L (ref 3.5–5.1)
Potassium: 3.8 mmol/L (ref 3.5–5.1)
Potassium: 4 mmol/L (ref 3.5–5.1)

## 2018-09-11 MED ORDER — ROPINIROLE HCL 0.5 MG PO TABS: 0.5000 mg | ORAL_TABLET | Freq: Every day | ORAL | 3 refills | Status: DC

## 2018-09-11 MED ORDER — RENA-VITE PO TABS: 1.0000 | ORAL_TABLET | Freq: Every day | ORAL | 3 refills | Status: DC

## 2018-09-11 MED ORDER — UNABLE TO FIND: 0 refills | Status: DC

## 2018-09-11 MED ORDER — SEVELAMER CARBONATE 800 MG PO TABS: 3200.0000 mg | ORAL_TABLET | Freq: Three times a day (TID) | ORAL | 5 refills | Status: AC

## 2018-09-11 MED ORDER — CYANOCOBALAMIN 2000 MCG PO TABS: 2000.0000 ug | ORAL_TABLET | Freq: Every day | ORAL | 3 refills | Status: DC

## 2018-09-11 MED ORDER — THIAMINE HCL 100 MG PO TABS: 100.0000 mg | ORAL_TABLET | Freq: Every day | ORAL | 3 refills | Status: DC

## 2018-09-11 NOTE — Evaluation (Signed)
Physical Therapy Evaluation Patient Details Name: Maurice Bell MRN: [id number] DOB: 05-07-1956 Today's Date: 09/11/2018   History of Present Illness  Pt is a 62 y.o. M with significant PMH of ESRD, anxiety, hepatitis C, hypertension, history of CVA, now presenting to ED for evaluation of worsening choreoathetoid movements in the setting of uremia from missed dialysis.  Clinical Impression  Patient admitted with above diagnosis. 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 On PT evaluation, pt presenting with dynamic balance impairments but no myoclonic movements noted. Ambulating 300 feet with min guard assist, displaying occasional scissoring and drifting left/right with dual tasking and high level balance tasks I.e. Head turns. Suspect balance will continue to improve, but recommend OPPT to address to return to PLOF. Will follow acutely.    Follow Up Recommendations Outpatient PT    Equipment Recommendations  None recommended by PT    Recommendations for Other Services       Precautions / Restrictions Precautions Precautions: Fall Restrictions Weight Bearing Restrictions: No      Mobility  Bed Mobility Overal bed mobility: Independent                Transfers Overall transfer level: Independent Equipment used: None                Ambulation/Gait Ambulation/Gait assistance: Counsellor (Feet): 300 Feet Assistive device: None Gait Pattern/deviations: Step-through pattern;Scissoring;Narrow base of support     General Gait Details: Patient with dynamic balance deficits noted throughout gait with difficulty particularly with dual tasking. Did improve somewhat as ambulation increased  Stairs            Wheelchair Mobility    Modified Rankin (Stroke Patients Only)       Balance Overall balance assessment: Needs assistance   Sitting  balance-Leahy Scale: Normal     Standing balance support: No upper extremity supported;During functional activity Standing balance-Leahy Scale: Good Standing balance comment: Good static balance but dynamic balance deficits noted                             Pertinent Vitals/Pain Pain Assessment: No/denies pain    Home Living Family/patient expects to be discharged to:: Private residence Living Arrangements: Parent Available Help at Discharge: Family;Available PRN/intermittently(son) Type of Home: House Home Access: Level entry     Home Layout: One level Home Equipment: None      Prior Function Level of Independence: Independent         Comments: Drives to dialysis, 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     Hand Dominance        Extremity/Trunk Assessment   Upper Extremity Assessment Upper Extremity Assessment: Overall WFL for tasks assessed    Lower Extremity Assessment Lower Extremity Assessment: Overall WFL for tasks assessed    Cervical / Trunk Assessment Cervical / Trunk Assessment: Normal  Communication   Communication: No difficulties  Cognition Arousal/Alertness: Awake/alert Behavior During Therapy: WFL for tasks assessed/performed Overall Cognitive Status: Within Functional Limits for tasks assessed                                 General Comments: Slightly impulsive      General Comments      Exercises     Assessment/Plan    PT Assessment Patient needs  continued PT services  PT Problem List Decreased balance;Decreased mobility       PT Treatment Interventions Gait training;Stair training;Functional mobility training;Therapeutic activities;Therapeutic exercise;Balance training;Patient/family education    PT Goals (Current goals can be found in the Care Plan section)  Acute Rehab PT Goals Patient Stated Goal: "go home." PT Goal Formulation: With patient Time For Goal Achievement: 09/25/18 Potential to  Achieve Goals: Good    Frequency Min 3X/week   Barriers to discharge        Co-evaluation               AM-PAC PT "6 Clicks" Daily Activity  Outcome Measure Difficulty turning over in bed (including adjusting bedclothes, sheets and blankets)?: None Difficulty moving from lying on back to sitting on the side of the bed? : None Difficulty sitting down on and standing up from a chair with arms (e.g., wheelchair, bedside commode, etc,.)?: None Help needed moving to and from a bed to chair (including a wheelchair)?: None Help needed walking in hospital room?: A Little Help needed climbing 3-5 steps with a railing? : A Little 6 Click Score: 22    End of Session Equipment Utilized During Treatment: Gait belt Activity Tolerance: Patient tolerated treatment well Patient left: in bed;with call bell/phone within reach;with nursing/sitter in room Nurse Communication: Mobility status PT Visit Diagnosis: Unsteadiness on feet (R26.81);Other abnormalities of gait and mobility (R26.89)    Time: 5379-4327 PT Time Calculation (min) (ACUTE ONLY): 13 min   Charges:   PT Evaluation $PT Eval Moderate Complexity: 1 Mod          Ellamae Sia, [place], DPT Acute Rehabilitation Services Pager [phone] Office [phone]   Willy Eddy 09/11/2018, 12:01 PM
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
## 2018-09-11 NOTE — Progress Notes (Signed)
Patient is discharging home. Son is here to transfer pt. All personal belongings sent with pt. Discharge paperwork went over with pt and family members. All questions and concerns addressed. IV taken out.

## 2018-09-13 DIAGNOSIS — D631 Anemia in chronic kidney disease: Secondary | ICD-10-CM | POA: Diagnosis not present

## 2018-09-13 DIAGNOSIS — E875 Hyperkalemia: Secondary | ICD-10-CM | POA: Diagnosis not present

## 2018-09-13 DIAGNOSIS — N186 End stage renal disease: Secondary | ICD-10-CM | POA: Diagnosis not present

## 2018-09-13 DIAGNOSIS — N2581 Secondary hyperparathyroidism of renal origin: Secondary | ICD-10-CM | POA: Diagnosis not present

## 2018-09-13 LAB — HEAVY METALS, BLOOD
ARSENIC: 8 ug/L (ref 2–23)
LEAD: 3 ug/dL (ref 0–4)
Mercury: NOT DETECTED ug/L (ref 0.0–14.9)

## 2018-09-13 LAB — VITAMIN B1

## 2018-09-15 DIAGNOSIS — N2581 Secondary hyperparathyroidism of renal origin: Secondary | ICD-10-CM | POA: Diagnosis not present

## 2018-09-15 DIAGNOSIS — N186 End stage renal disease: Secondary | ICD-10-CM | POA: Diagnosis not present

## 2018-09-15 DIAGNOSIS — E875 Hyperkalemia: Secondary | ICD-10-CM | POA: Diagnosis not present

## 2018-09-15 DIAGNOSIS — D631 Anemia in chronic kidney disease: Secondary | ICD-10-CM | POA: Diagnosis not present

## 2018-09-17 DIAGNOSIS — N2581 Secondary hyperparathyroidism of renal origin: Secondary | ICD-10-CM | POA: Diagnosis not present

## 2018-09-17 DIAGNOSIS — D631 Anemia in chronic kidney disease: Secondary | ICD-10-CM | POA: Diagnosis not present

## 2018-09-17 DIAGNOSIS — E875 Hyperkalemia: Secondary | ICD-10-CM | POA: Diagnosis not present

## 2018-09-17 DIAGNOSIS — N186 End stage renal disease: Secondary | ICD-10-CM | POA: Diagnosis not present

## 2018-09-20 DIAGNOSIS — N186 End stage renal disease: Secondary | ICD-10-CM | POA: Diagnosis not present

## 2018-09-20 DIAGNOSIS — E875 Hyperkalemia: Secondary | ICD-10-CM | POA: Diagnosis not present

## 2018-09-20 DIAGNOSIS — N2581 Secondary hyperparathyroidism of renal origin: Secondary | ICD-10-CM | POA: Diagnosis not present

## 2018-09-20 DIAGNOSIS — D631 Anemia in chronic kidney disease: Secondary | ICD-10-CM | POA: Diagnosis not present

## 2018-09-22 DIAGNOSIS — D631 Anemia in chronic kidney disease: Secondary | ICD-10-CM | POA: Diagnosis not present

## 2018-09-22 DIAGNOSIS — E875 Hyperkalemia: Secondary | ICD-10-CM | POA: Diagnosis not present

## 2018-09-22 DIAGNOSIS — N186 End stage renal disease: Secondary | ICD-10-CM | POA: Diagnosis not present

## 2018-09-22 DIAGNOSIS — N2581 Secondary hyperparathyroidism of renal origin: Secondary | ICD-10-CM | POA: Diagnosis not present

## 2018-09-24 DIAGNOSIS — E875 Hyperkalemia: Secondary | ICD-10-CM | POA: Diagnosis not present

## 2018-09-24 DIAGNOSIS — D631 Anemia in chronic kidney disease: Secondary | ICD-10-CM | POA: Diagnosis not present

## 2018-09-24 DIAGNOSIS — N186 End stage renal disease: Secondary | ICD-10-CM | POA: Diagnosis not present

## 2018-09-24 DIAGNOSIS — N2581 Secondary hyperparathyroidism of renal origin: Secondary | ICD-10-CM | POA: Diagnosis not present

## 2018-09-26 DIAGNOSIS — N186 End stage renal disease: Secondary | ICD-10-CM | POA: Diagnosis not present

## 2018-09-26 DIAGNOSIS — E875 Hyperkalemia: Secondary | ICD-10-CM | POA: Diagnosis not present

## 2018-09-26 DIAGNOSIS — D631 Anemia in chronic kidney disease: Secondary | ICD-10-CM | POA: Diagnosis not present

## 2018-09-26 DIAGNOSIS — N2581 Secondary hyperparathyroidism of renal origin: Secondary | ICD-10-CM | POA: Diagnosis not present

## 2018-09-28 ENCOUNTER — Inpatient Hospital Stay (INDEPENDENT_AMBULATORY_CARE_PROVIDER_SITE_OTHER): Payer: Medicare Other | Admitting: Physician Assistant

## 2018-09-28 DIAGNOSIS — N2581 Secondary hyperparathyroidism of renal origin: Secondary | ICD-10-CM | POA: Diagnosis not present

## 2018-09-28 DIAGNOSIS — D631 Anemia in chronic kidney disease: Secondary | ICD-10-CM | POA: Diagnosis not present

## 2018-09-28 DIAGNOSIS — E875 Hyperkalemia: Secondary | ICD-10-CM | POA: Diagnosis not present

## 2018-09-28 DIAGNOSIS — N186 End stage renal disease: Secondary | ICD-10-CM | POA: Diagnosis not present

## 2018-09-29 ENCOUNTER — Emergency Department (HOSPITAL_COMMUNITY): Payer: Medicare Other

## 2018-09-29 ENCOUNTER — Emergency Department (HOSPITAL_COMMUNITY)
Admission: EM | Admit: 2018-09-29 | Discharge: 2018-09-29 | Discharge status: Against Medical Advice | Payer: Medicare Other | Attending: Emergency Medicine | Admitting: Emergency Medicine

## 2018-09-29 ENCOUNTER — Encounter (HOSPITAL_COMMUNITY): Payer: Self-pay | Admitting: Emergency Medicine

## 2018-09-29 ENCOUNTER — Other Ambulatory Visit: Payer: Self-pay

## 2018-09-29 DIAGNOSIS — N186 End stage renal disease: Secondary | ICD-10-CM | POA: Diagnosis not present

## 2018-09-29 DIAGNOSIS — M5412 Radiculopathy, cervical region: Secondary | ICD-10-CM | POA: Diagnosis not present

## 2018-09-29 DIAGNOSIS — R29818 Other symptoms and signs involving the nervous system: Secondary | ICD-10-CM | POA: Diagnosis not present

## 2018-09-29 DIAGNOSIS — Z87891 Personal history of nicotine dependence: Secondary | ICD-10-CM | POA: Diagnosis not present

## 2018-09-29 DIAGNOSIS — S128XXA Fracture of other parts of neck, initial encounter: Secondary | ICD-10-CM | POA: Diagnosis not present

## 2018-09-29 DIAGNOSIS — Z79899 Other long term (current) drug therapy: Secondary | ICD-10-CM | POA: Diagnosis not present

## 2018-09-29 DIAGNOSIS — S129XXA Fracture of neck, unspecified, initial encounter: Secondary | ICD-10-CM

## 2018-09-29 DIAGNOSIS — Z992 Dependence on renal dialysis: Secondary | ICD-10-CM | POA: Diagnosis not present

## 2018-09-29 DIAGNOSIS — I120 Hypertensive chronic kidney disease with stage 5 chronic kidney disease or end stage renal disease: Secondary | ICD-10-CM | POA: Diagnosis not present

## 2018-09-29 DIAGNOSIS — R202 Paresthesia of skin: Secondary | ICD-10-CM | POA: Diagnosis not present

## 2018-09-29 DIAGNOSIS — R531 Weakness: Secondary | ICD-10-CM | POA: Diagnosis not present

## 2018-09-29 DIAGNOSIS — R200 Anesthesia of skin: Secondary | ICD-10-CM | POA: Diagnosis not present

## 2018-09-29 LAB — DIFFERENTIAL
ABS IMMATURE GRANULOCYTES: 0.01 10*3/uL (ref 0.00–0.07)
BASOS ABS: 0 10*3/uL (ref 0.0–0.1)
BASOS PCT: 1 %
Eosinophils Absolute: 0.1 10*3/uL (ref 0.0–0.5)
Eosinophils Relative: 4 %
IMMATURE GRANULOCYTES: 0 %
Lymphocytes Relative: 30 %
Lymphs Abs: 0.8 10*3/uL (ref 0.7–4.0)
Monocytes Absolute: 0.5 10*3/uL (ref 0.1–1.0)
Monocytes Relative: 17 %
NEUTROS ABS: 1.3 10*3/uL — AB (ref 1.7–7.7)
NEUTROS PCT: 48 %

## 2018-09-29 LAB — I-STAT CHEM 8, ED
BUN: 32 mg/dL — ABNORMAL HIGH (ref 6–20)
CALCIUM ION: 1.03 mmol/L — AB (ref 1.15–1.40)
CHLORIDE: 89 mmol/L — AB (ref 98–111)
Creatinine, Ser: 10.3 mg/dL — ABNORMAL HIGH (ref 0.61–1.24)
Glucose, Bld: 92 mg/dL (ref 70–99)
HCT: 32 % — ABNORMAL LOW (ref 39.0–52.0)
Hemoglobin: 10.9 g/dL — ABNORMAL LOW (ref 13.0–17.0)
Potassium: 4.1 mmol/L (ref 3.5–5.1)
Sodium: 134 mmol/L — ABNORMAL LOW (ref 135–145)
TCO2: 38 mmol/L — AB (ref 22–32)

## 2018-09-29 LAB — COMPREHENSIVE METABOLIC PANEL
ALBUMIN: 3.8 g/dL (ref 3.5–5.0)
ALT: 42 U/L (ref 0–44)
AST: 60 U/L — AB (ref 15–41)
Alkaline Phosphatase: 98 U/L (ref 38–126)
Anion gap: 16 — ABNORMAL HIGH (ref 5–15)
BUN: 29 mg/dL — ABNORMAL HIGH (ref 6–20)
CO2: 33 mmol/L — ABNORMAL HIGH (ref 22–32)
CREATININE: 10.13 mg/dL — AB (ref 0.61–1.24)
Calcium: 9.2 mg/dL (ref 8.9–10.3)
Chloride: 88 mmol/L — ABNORMAL LOW (ref 98–111)
GFR calc non Af Amer: 5 mL/min — ABNORMAL LOW (ref >60)
GFR, EST AFRICAN AMERICAN: 6 mL/min — AB (ref >60)
Glucose, Bld: 97 mg/dL (ref 70–99)
Potassium: 4.1 mmol/L (ref 3.5–5.1)
Sodium: 137 mmol/L (ref 135–145)
Total Bilirubin: 0.3 mg/dL (ref 0.3–1.2)
Total Protein: 7.6 g/dL (ref 6.5–8.1)

## 2018-09-29 LAB — CBC
HEMATOCRIT: 30.5 % — AB (ref 39.0–52.0)
HEMOGLOBIN: 9.8 g/dL — AB (ref 13.0–17.0)
MCH: 34 pg (ref 26.0–34.0)
MCHC: 32.1 g/dL (ref 30.0–36.0)
MCV: 105.9 fL — ABNORMAL HIGH (ref 80.0–100.0)
Platelets: 142 10*3/uL — ABNORMAL LOW (ref 150–400)
RBC: 2.88 MIL/uL — AB (ref 4.22–5.81)
RDW: 14.7 % (ref 11.5–15.5)
WBC: 2.7 10*3/uL — AB (ref 4.0–10.5)
nRBC: 0 % (ref 0.0–0.2)

## 2018-09-29 LAB — CBG MONITORING, ED: Glucose-Capillary: 91 mg/dL (ref 70–99)

## 2018-09-29 LAB — APTT: APTT: 32 s (ref 24–36)

## 2018-09-29 LAB — PROTIME-INR
INR: 1.12
Prothrombin Time: 14.3 seconds (ref 11.4–15.2)

## 2018-09-29 LAB — I-STAT TROPONIN, ED: Troponin i, poc: 0.04 ng/mL (ref 0.00–0.08)

## 2018-09-29 LAB — SEDIMENTATION RATE: Sed Rate: 45 mm/hr — ABNORMAL HIGH (ref 0–16)

## 2018-09-29 MED ORDER — HYDROCODONE-ACETAMINOPHEN 5-325 MG PO TABS: 1.0000 | ORAL_TABLET | Freq: Four times a day (QID) | ORAL | 0 refills | Status: DC | PRN

## 2018-09-29 MED ORDER — LORAZEPAM 1 MG PO TABS
1.0000 mg | ORAL_TABLET | Freq: Once | ORAL | Status: AC
  Administered 2018-09-29: 1 mg via ORAL
  Filled 2018-09-29: qty 1

## 2018-09-29 MED ORDER — PREDNISONE 20 MG PO TABS: ORAL_TABLET | ORAL | 0 refills | Status: DC

## 2018-09-29 NOTE — ED Triage Notes (Signed)
Pt reports he has had numbness and tingling of his right arm for the last month. Pt reports being seen at the ED multiple times for the same problem.

## 2018-09-29 NOTE — Progress Notes (Signed)
Patient ID: Maurice Bell, male   DOB: 19-May-1956, 62 y.o.   MRN: [id number] Reviewed CT and MRI scan... 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 Spinous process fracture is not necessarily an unstable fracture however unclear etiology with no history of trauma. Certainly there is reason to have radicular symptoms and potentially some isolated weakness however due the patient's multiple medical comorbidities he is not a surgical candidate any surgical procedure on him would have a high risk of failure. So based on his Admission back in early November he was seen by neurology for metabolic encephalopathy and that apparently is when this symptoms began at that time we saw sedimentation rate of 92 would recommend repeating the sedimentation rate if it still elevated and not trending down possibly would consider recommending discussions with the primary medical service as to whether this represents occult infection versus autoimmune disease as suggested by neurology at that time. From a neurosurgical perspective patient going a cervical collar for the time being to try to afford some symptomatically.

## 2018-09-29 NOTE — ED Notes (Signed)
Pt refused aspen collar , pt signed out ama and did not take his discharge instructions
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
## 2018-09-29 NOTE — ED Notes (Signed)
Patient transported to MRI 

## 2018-09-29 NOTE — ED Provider Notes (Signed)
7:28 AM Assumed care from Dr. Wyvonnia Dusky, please see their note for full history, physical and decision making until this point. In brief this is a 62 y.o. year old male who presented to the ED tonight with Right arm numbness     Pending MRI, if no cord/nerve compression can be discharged.   No nerve/cord compression but discussed with radiologist and there are significant findings concerning for dialysis related changes and acute fracture. Radiologist thinks findings unlikely c/w infection as it hasn't really changed much in last month but it is a possibility.   Discussed with Dr. Saintclair Halsted. Recommends ESR and if below 90-100 range, can be put in C collar and discharged to fu as outpatient. If elevated, discuss with medicine regarding admission and ruling out infection.   ESR much lower than previously.  Blood cultures drawn.  Patient stable for discharge with close follow-up  Discharge instructions, including strict return precautions for new or worsening symptoms, given. Patient and/or family verbalized understanding and agreement with the plan as described.   Labs, studies and imaging reviewed by myself and considered in medical decision making if ordered. Imaging interpreted by radiology.  Labs Reviewed  CBC - Abnormal; Notable for the following components:      Result Value   WBC 2.7 (*)    RBC 2.88 (*)    Hemoglobin 9.8 (*)    HCT 30.5 (*)    MCV 105.9 (*)    Platelets 142 (*)    All other components within normal limits  DIFFERENTIAL - Abnormal; Notable for the following components:   Neutro Abs 1.3 (*)    All other components within normal limits  COMPREHENSIVE METABOLIC PANEL - Abnormal; Notable for the following components:   Chloride 88 (*)    CO2 33 (*)    BUN 29 (*)    Creatinine, Ser 10.13 (*)    AST 60 (*)    GFR calc non Af Amer 5 (*)    GFR calc Af Amer 6 (*)    Anion gap 16 (*)    All other components within normal limits  I-STAT CHEM 8, ED - Abnormal; Notable for the  following components:   Sodium 134 (*)    Chloride 89 (*)    BUN 32 (*)    Creatinine, Ser 10.30 (*)    Calcium, Ion 1.03 (*)    TCO2 38 (*)    Hemoglobin 10.9 (*)    HCT 32.0 (*)    All other components within normal limits  PROTIME-INR  APTT  I-STAT TROPONIN, ED  CBG MONITORING, ED    CT Head Wo Contrast  Final Result    CT Cervical Spine Wo Contrast  Final Result    MR BRAIN WO CONTRAST    (Results Pending)  MR CERVICAL SPINE WO CONTRAST    (Results Pending)    No follow-ups on file.    Merrily Pew, MD 09/30/18 1201

## 2018-10-01 DIAGNOSIS — E875 Hyperkalemia: Secondary | ICD-10-CM | POA: Diagnosis not present

## 2018-10-01 DIAGNOSIS — N186 End stage renal disease: Secondary | ICD-10-CM | POA: Diagnosis not present

## 2018-10-01 DIAGNOSIS — D631 Anemia in chronic kidney disease: Secondary | ICD-10-CM | POA: Diagnosis not present

## 2018-10-01 DIAGNOSIS — N2581 Secondary hyperparathyroidism of renal origin: Secondary | ICD-10-CM | POA: Diagnosis not present

## 2018-10-02 DIAGNOSIS — N186 End stage renal disease: Secondary | ICD-10-CM | POA: Diagnosis not present

## 2018-10-02 DIAGNOSIS — I129 Hypertensive chronic kidney disease with stage 1 through stage 4 chronic kidney disease, or unspecified chronic kidney disease: Secondary | ICD-10-CM | POA: Diagnosis not present

## 2018-10-02 DIAGNOSIS — Z992 Dependence on renal dialysis: Secondary | ICD-10-CM | POA: Diagnosis not present

## 2018-10-04 DIAGNOSIS — D631 Anemia in chronic kidney disease: Secondary | ICD-10-CM | POA: Diagnosis not present

## 2018-10-04 DIAGNOSIS — E875 Hyperkalemia: Secondary | ICD-10-CM | POA: Diagnosis not present

## 2018-10-04 DIAGNOSIS — N2581 Secondary hyperparathyroidism of renal origin: Secondary | ICD-10-CM | POA: Diagnosis not present

## 2018-10-04 DIAGNOSIS — N186 End stage renal disease: Secondary | ICD-10-CM | POA: Diagnosis not present

## 2018-10-04 LAB — CULTURE, BLOOD (ROUTINE X 2)
Culture: NO GROWTH
Culture: NO GROWTH
Special Requests: ADEQUATE
Special Requests: ADEQUATE

## 2018-10-05 ENCOUNTER — Ambulatory Visit (INDEPENDENT_AMBULATORY_CARE_PROVIDER_SITE_OTHER): Payer: Medicare Other | Admitting: Orthopaedic Surgery

## 2018-10-06 ENCOUNTER — Other Ambulatory Visit: Payer: Self-pay

## 2018-10-06 ENCOUNTER — Encounter (HOSPITAL_COMMUNITY): Payer: Self-pay | Admitting: Emergency Medicine

## 2018-10-06 ENCOUNTER — Emergency Department (HOSPITAL_COMMUNITY): Payer: Medicare Other

## 2018-10-06 ENCOUNTER — Observation Stay (HOSPITAL_COMMUNITY)
Admission: EM | Admit: 2018-10-06 | Discharge: 2018-10-07 | Disposition: A | Discharge status: Home / Self Care | Payer: Medicare Other | Attending: Internal Medicine | Admitting: Internal Medicine

## 2018-10-06 DIAGNOSIS — M4802 Spinal stenosis, cervical region: Secondary | ICD-10-CM | POA: Diagnosis not present

## 2018-10-06 DIAGNOSIS — N19 Unspecified kidney failure: Secondary | ICD-10-CM | POA: Diagnosis present

## 2018-10-06 DIAGNOSIS — R404 Transient alteration of awareness: Secondary | ICD-10-CM | POA: Diagnosis not present

## 2018-10-06 DIAGNOSIS — G253 Myoclonus: Principal | ICD-10-CM | POA: Diagnosis present

## 2018-10-06 DIAGNOSIS — Z992 Dependence on renal dialysis: Secondary | ICD-10-CM | POA: Insufficient documentation

## 2018-10-06 DIAGNOSIS — X58XXXA Exposure to other specified factors, initial encounter: Secondary | ICD-10-CM | POA: Diagnosis not present

## 2018-10-06 DIAGNOSIS — Z87891 Personal history of nicotine dependence: Secondary | ICD-10-CM | POA: Insufficient documentation

## 2018-10-06 DIAGNOSIS — F419 Anxiety disorder, unspecified: Secondary | ICD-10-CM | POA: Diagnosis not present

## 2018-10-06 DIAGNOSIS — R4701 Aphasia: Secondary | ICD-10-CM | POA: Insufficient documentation

## 2018-10-06 DIAGNOSIS — B1920 Unspecified viral hepatitis C without hepatic coma: Secondary | ICD-10-CM | POA: Diagnosis not present

## 2018-10-06 DIAGNOSIS — I639 Cerebral infarction, unspecified: Secondary | ICD-10-CM | POA: Insufficient documentation

## 2018-10-06 DIAGNOSIS — D631 Anemia in chronic kidney disease: Secondary | ICD-10-CM | POA: Diagnosis not present

## 2018-10-06 DIAGNOSIS — K759 Inflammatory liver disease, unspecified: Secondary | ICD-10-CM | POA: Insufficient documentation

## 2018-10-06 DIAGNOSIS — S12400A Unspecified displaced fracture of fifth cervical vertebra, initial encounter for closed fracture: Secondary | ICD-10-CM | POA: Diagnosis not present

## 2018-10-06 DIAGNOSIS — F1210 Cannabis abuse, uncomplicated: Secondary | ICD-10-CM | POA: Insufficient documentation

## 2018-10-06 DIAGNOSIS — Z8673 Personal history of transient ischemic attack (TIA), and cerebral infarction without residual deficits: Secondary | ICD-10-CM | POA: Insufficient documentation

## 2018-10-06 DIAGNOSIS — I120 Hypertensive chronic kidney disease with stage 5 chronic kidney disease or end stage renal disease: Secondary | ICD-10-CM | POA: Insufficient documentation

## 2018-10-06 DIAGNOSIS — M5011 Cervical disc disorder with radiculopathy,  high cervical region: Secondary | ICD-10-CM | POA: Diagnosis not present

## 2018-10-06 DIAGNOSIS — Z79899 Other long term (current) drug therapy: Secondary | ICD-10-CM | POA: Insufficient documentation

## 2018-10-06 DIAGNOSIS — R471 Dysarthria and anarthria: Secondary | ICD-10-CM | POA: Diagnosis not present

## 2018-10-06 DIAGNOSIS — R410 Disorientation, unspecified: Secondary | ICD-10-CM | POA: Diagnosis not present

## 2018-10-06 DIAGNOSIS — I10 Essential (primary) hypertension: Secondary | ICD-10-CM | POA: Diagnosis present

## 2018-10-06 DIAGNOSIS — Z862 Personal history of diseases of the blood and blood-forming organs and certain disorders involving the immune mechanism: Secondary | ICD-10-CM

## 2018-10-06 DIAGNOSIS — G2581 Restless legs syndrome: Secondary | ICD-10-CM | POA: Insufficient documentation

## 2018-10-06 DIAGNOSIS — N186 End stage renal disease: Secondary | ICD-10-CM | POA: Diagnosis not present

## 2018-10-06 DIAGNOSIS — N289 Disorder of kidney and ureter, unspecified: Secondary | ICD-10-CM

## 2018-10-06 DIAGNOSIS — N189 Chronic kidney disease, unspecified: Secondary | ICD-10-CM

## 2018-10-06 HISTORY — DX: Major depressive disorder, single episode, unspecified: F32.9

## 2018-10-06 HISTORY — DX: Myoclonus: G25.3

## 2018-10-06 HISTORY — DX: End stage renal disease: N18.6

## 2018-10-06 HISTORY — DX: Anemia, unspecified: D64.9

## 2018-10-06 HISTORY — DX: Unspecified viral hepatitis C without hepatic coma: B19.20

## 2018-10-06 HISTORY — DX: End stage renal disease: Z99.2

## 2018-10-06 HISTORY — DX: Personal history of diseases of the blood and blood-forming organs and certain disorders involving the immune mechanism: Z86.2

## 2018-10-06 HISTORY — DX: Depression, unspecified: F32.A

## 2018-10-06 HISTORY — DX: Chronic kidney disease, unspecified: N18.9

## 2018-10-06 LAB — CBC WITH DIFFERENTIAL/PLATELET
Abs Immature Granulocytes: 0 10*3/uL (ref 0.00–0.07)
Basophils Absolute: 0 10*3/uL (ref 0.0–0.1)
Basophils Relative: 1 %
EOS PCT: 4 %
Eosinophils Absolute: 0.1 10*3/uL (ref 0.0–0.5)
HCT: 28.9 % — ABNORMAL LOW (ref 39.0–52.0)
Hemoglobin: 8.8 g/dL — ABNORMAL LOW (ref 13.0–17.0)
Lymphocytes Relative: 31 %
Lymphs Abs: 1.1 10*3/uL (ref 0.7–4.0)
MCH: 33.6 pg (ref 26.0–34.0)
MCHC: 30.4 g/dL (ref 30.0–36.0)
MCV: 110.3 fL — ABNORMAL HIGH (ref 80.0–100.0)
MONO ABS: 0.3 10*3/uL (ref 0.1–1.0)
Monocytes Relative: 8 %
Neutro Abs: 2 10*3/uL (ref 1.7–7.7)
Neutrophils Relative %: 56 %
Platelets: 138 10*3/uL — ABNORMAL LOW (ref 150–400)
RBC: 2.62 MIL/uL — ABNORMAL LOW (ref 4.22–5.81)
RDW: 14.9 % (ref 11.5–15.5)
WBC: 3.6 10*3/uL — ABNORMAL LOW (ref 4.0–10.5)
nRBC: 0 % (ref 0.0–0.2)
nRBC: 0 /100 WBC

## 2018-10-06 LAB — COMPREHENSIVE METABOLIC PANEL
ALT: 26 U/L (ref 0–44)
AST: 33 U/L (ref 15–41)
Albumin: 3.8 g/dL (ref 3.5–5.0)
Alkaline Phosphatase: 64 U/L (ref 38–126)
Anion gap: 19 — ABNORMAL HIGH (ref 5–15)
BUN: 59 mg/dL — ABNORMAL HIGH (ref 6–20)
CALCIUM: 9.6 mg/dL (ref 8.9–10.3)
CO2: 28 mmol/L (ref 22–32)
Chloride: 93 mmol/L — ABNORMAL LOW (ref 98–111)
Creatinine, Ser: 16.07 mg/dL — ABNORMAL HIGH (ref 0.61–1.24)
GFR calc Af Amer: 3 mL/min — ABNORMAL LOW (ref >60)
GFR, EST NON AFRICAN AMERICAN: 3 mL/min — AB (ref >60)
Glucose, Bld: 93 mg/dL (ref 70–99)
Potassium: 5.8 mmol/L — ABNORMAL HIGH (ref 3.5–5.1)
Sodium: 140 mmol/L (ref 135–145)
TOTAL PROTEIN: 7.7 g/dL (ref 6.5–8.1)
Total Bilirubin: 0.5 mg/dL (ref 0.3–1.2)

## 2018-10-06 LAB — MRSA PCR SCREENING: MRSA by PCR: NEGATIVE

## 2018-10-06 LAB — AMMONIA: Ammonia: 19 umol/L (ref 9–35)

## 2018-10-06 MED ORDER — CLONIDINE HCL 0.1 MG PO TABS
0.1000 mg | ORAL_TABLET | Freq: Two times a day (BID) | ORAL | Status: DC
  Administered 2018-10-06 (×2): 0.1 mg via ORAL
  Filled 2018-10-06 (×2): qty 1

## 2018-10-06 MED ORDER — NEPRO/CARBSTEADY PO LIQD
237.0000 mL | Freq: Three times a day (TID) | ORAL | Status: DC | PRN
  Filled 2018-10-06: qty 237

## 2018-10-06 MED ORDER — ZOLPIDEM TARTRATE 5 MG PO TABS: 5.0000 mg | ORAL_TABLET | Freq: Every evening | ORAL | Status: DC | PRN

## 2018-10-06 MED ORDER — ACETAMINOPHEN 325 MG PO TABS: 650.0000 mg | ORAL_TABLET | Freq: Four times a day (QID) | ORAL | Status: DC | PRN

## 2018-10-06 MED ORDER — RENA-VITE PO TABS
1.0000 | ORAL_TABLET | Freq: Every day | ORAL | Status: DC
  Administered 2018-10-06: 1 via ORAL
  Filled 2018-10-06 (×2): qty 1

## 2018-10-06 MED ORDER — HEPARIN SODIUM (PORCINE) 1000 UNIT/ML DIALYSIS: 1000.0000 [IU] | INTRAMUSCULAR | Status: DC | PRN

## 2018-10-06 MED ORDER — HYDROCODONE-ACETAMINOPHEN 5-325 MG PO TABS: 1.0000 | ORAL_TABLET | Freq: Four times a day (QID) | ORAL | Status: DC | PRN

## 2018-10-06 MED ORDER — SODIUM CHLORIDE 0.9 % IV SOLN: 100.0000 mL | INTRAVENOUS | Status: DC | PRN

## 2018-10-06 MED ORDER — PENTAFLUOROPROP-TETRAFLUOROETH EX AERO: 1.0000 "application " | INHALATION_SPRAY | CUTANEOUS | Status: DC | PRN

## 2018-10-06 MED ORDER — DOCUSATE SODIUM 100 MG PO CAPS
100.0000 mg | ORAL_CAPSULE | Freq: Two times a day (BID) | ORAL | Status: DC
  Administered 2018-10-06: 100 mg via ORAL
  Filled 2018-10-06: qty 1

## 2018-10-06 MED ORDER — DOCUSATE SODIUM 283 MG RE ENEM
1.0000 | ENEMA | RECTAL | Status: DC | PRN
  Filled 2018-10-06: qty 1

## 2018-10-06 MED ORDER — NEPRO/CARBSTEADY PO LIQD: 237.0000 mL | Freq: Two times a day (BID) | ORAL | Status: DC

## 2018-10-06 MED ORDER — CALCIUM CARBONATE ANTACID 1250 MG/5ML PO SUSP
500.0000 mg | Freq: Four times a day (QID) | ORAL | Status: DC | PRN
  Filled 2018-10-06: qty 5

## 2018-10-06 MED ORDER — CAMPHOR-MENTHOL 0.5-0.5 % EX LOTN
1.0000 "application " | TOPICAL_LOTION | Freq: Three times a day (TID) | CUTANEOUS | Status: DC | PRN
  Filled 2018-10-06: qty 222

## 2018-10-06 MED ORDER — CLONIDINE HCL 0.1 MG PO TABS
0.1000 mg | ORAL_TABLET | Freq: Once | ORAL | Status: AC
  Administered 2018-10-06: 0.1 mg via ORAL
  Filled 2018-10-06: qty 1

## 2018-10-06 MED ORDER — ENOXAPARIN SODIUM 30 MG/0.3ML ~~LOC~~ SOLN
30.0000 mg | SUBCUTANEOUS | Status: DC
  Administered 2018-10-06: 30 mg via SUBCUTANEOUS
  Filled 2018-10-06 (×2): qty 0.3

## 2018-10-06 MED ORDER — SODIUM CHLORIDE 0.9% FLUSH
3.0000 mL | Freq: Two times a day (BID) | INTRAVENOUS | Status: DC
  Administered 2018-10-06 (×2): 3 mL via INTRAVENOUS

## 2018-10-06 MED ORDER — ACETAMINOPHEN 650 MG RE SUPP: 650.0000 mg | Freq: Four times a day (QID) | RECTAL | Status: DC | PRN

## 2018-10-06 MED ORDER — HEPARIN SODIUM (PORCINE) 1000 UNIT/ML DIALYSIS: 40.0000 [IU]/kg | INTRAMUSCULAR | Status: DC | PRN

## 2018-10-06 MED ORDER — LIDOCAINE-PRILOCAINE 2.5-2.5 % EX CREA: 1.0000 "application " | TOPICAL_CREAM | CUTANEOUS | Status: DC | PRN

## 2018-10-06 MED ORDER — SEVELAMER CARBONATE 800 MG PO TABS
3200.0000 mg | ORAL_TABLET | Freq: Three times a day (TID) | ORAL | Status: DC
  Administered 2018-10-06: 3200 mg via ORAL
  Filled 2018-10-06: qty 4

## 2018-10-06 MED ORDER — LIDOCAINE HCL (PF) 1 % IJ SOLN: 5.0000 mL | INTRAMUSCULAR | Status: DC | PRN

## 2018-10-06 MED ORDER — OXYCODONE-ACETAMINOPHEN 5-325 MG PO TABS
1.0000 | ORAL_TABLET | Freq: Once | ORAL | Status: AC
  Administered 2018-10-06: 1 via ORAL
  Filled 2018-10-06: qty 1

## 2018-10-06 MED ORDER — VITAMIN B-12 1000 MCG PO TABS
2000.0000 ug | ORAL_TABLET | Freq: Every day | ORAL | Status: DC
  Administered 2018-10-06: 2000 ug via ORAL
  Filled 2018-10-06 (×2): qty 2

## 2018-10-06 MED ORDER — VITAMIN B-1 100 MG PO TABS
100.0000 mg | ORAL_TABLET | Freq: Every day | ORAL | Status: DC
  Administered 2018-10-06: 100 mg via ORAL
  Filled 2018-10-06: qty 1

## 2018-10-06 MED ORDER — ALTEPLASE 2 MG IJ SOLR
2.0000 mg | Freq: Once | INTRAMUSCULAR | Status: DC | PRN
  Filled 2018-10-06: qty 2

## 2018-10-06 MED ORDER — AMLODIPINE BESYLATE 10 MG PO TABS
10.0000 mg | ORAL_TABLET | Freq: Every day | ORAL | Status: DC
  Administered 2018-10-06: 10 mg via ORAL
  Filled 2018-10-06: qty 1

## 2018-10-06 MED ORDER — CHLORHEXIDINE GLUCONATE CLOTH 2 % EX PADS: 6.0000 | MEDICATED_PAD | Freq: Every day | CUTANEOUS | Status: DC

## 2018-10-06 MED ORDER — ROPINIROLE HCL 1 MG PO TABS
0.5000 mg | ORAL_TABLET | Freq: Every day | ORAL | Status: DC
  Administered 2018-10-06: 0.5 mg via ORAL
  Filled 2018-10-06 (×2): qty 1

## 2018-10-06 MED ORDER — SORBITOL 70 % SOLN
30.0000 mL | Status: DC | PRN
  Filled 2018-10-06: qty 30

## 2018-10-06 MED ORDER — ONDANSETRON HCL 4 MG/2ML IJ SOLN: 4.0000 mg | Freq: Four times a day (QID) | INTRAMUSCULAR | Status: DC | PRN

## 2018-10-06 MED ORDER — ONDANSETRON HCL 4 MG PO TABS: 4.0000 mg | ORAL_TABLET | Freq: Four times a day (QID) | ORAL | Status: DC | PRN

## 2018-10-06 MED ORDER — HYDROXYZINE HCL 25 MG PO TABS: 25.0000 mg | ORAL_TABLET | Freq: Three times a day (TID) | ORAL | Status: DC | PRN

## 2018-10-06 MED ORDER — HYDRALAZINE HCL 20 MG/ML IJ SOLN: 5.0000 mg | INTRAMUSCULAR | Status: DC | PRN

## 2018-10-06 NOTE — ED Notes (Signed)
Pt is poor historian, rambling during triage. Unable to state exactly why he came in this AM.
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
## 2018-10-06 NOTE — ED Notes (Signed)
Pt in POV reporting slurred speech, feeling "off balance", and neck pain X1 mo. Pt has already been seen for same, no new neuro deficits. Pt also scheduled to follow up with neurology
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
## 2018-10-07 DIAGNOSIS — R471 Dysarthria and anarthria: Secondary | ICD-10-CM | POA: Diagnosis not present

## 2018-10-07 DIAGNOSIS — G253 Myoclonus: Secondary | ICD-10-CM | POA: Diagnosis not present

## 2018-10-07 DIAGNOSIS — N186 End stage renal disease: Secondary | ICD-10-CM | POA: Diagnosis not present

## 2018-10-07 LAB — BASIC METABOLIC PANEL
Anion gap: 16 — ABNORMAL HIGH (ref 5–15)
BUN: 26 mg/dL — ABNORMAL HIGH (ref 6–20)
CO2: 27 mmol/L (ref 22–32)
Calcium: 9.1 mg/dL (ref 8.9–10.3)
Chloride: 94 mmol/L — ABNORMAL LOW (ref 98–111)
Creatinine, Ser: 10.48 mg/dL — ABNORMAL HIGH (ref 0.61–1.24)
GFR calc Af Amer: 6 mL/min — ABNORMAL LOW (ref >60)
GFR calc non Af Amer: 5 mL/min — ABNORMAL LOW (ref >60)
Glucose, Bld: 94 mg/dL (ref 70–99)
POTASSIUM: 5.1 mmol/L (ref 3.5–5.1)
Sodium: 137 mmol/L (ref 135–145)

## 2018-10-07 LAB — CBC
HCT: 28.7 % — ABNORMAL LOW (ref 39.0–52.0)
HEMOGLOBIN: 8.9 g/dL — AB (ref 13.0–17.0)
MCH: 33.6 pg (ref 26.0–34.0)
MCHC: 31 g/dL (ref 30.0–36.0)
MCV: 108.3 fL — ABNORMAL HIGH (ref 80.0–100.0)
Platelets: 127 10*3/uL — ABNORMAL LOW (ref 150–400)
RBC: 2.65 MIL/uL — ABNORMAL LOW (ref 4.22–5.81)
RDW: 14.8 % (ref 11.5–15.5)
WBC: 2.7 10*3/uL — ABNORMAL LOW (ref 4.0–10.5)
nRBC: 0 % (ref 0.0–0.2)

## 2018-10-07 MED ORDER — NEPRO/CARBSTEADY PO LIQD: 237.0000 mL | Freq: Two times a day (BID) | ORAL | 0 refills | Status: DC

## 2018-10-07 NOTE — Progress Notes (Signed)
competely awake and alert-speech is clear-no evidence of expressive aphasia. I do not see any myoclonus while I was in the room. Infact-patient now up and moving around-and wanting to leave right away-otherwise threatening to sign out AMA. He is acutally on the phone and is calling his ride. He will be discharged at his own request. Will notify nephrology later.

## 2018-10-07 NOTE — Discharge Summary (Signed)
PATIENT DETAILS Name: Maurice Bell Age: 62 y.o. Sex: male Date of Birth: 1956/08/17 MRN: [id number]. Admitting Physician: Karmen Bongo, MD ENI:[id number], Lesli Albee, PA-C  Admit Date: 10/06/2018 Discharge date: 10/07/2018  Recommendations for Outpatient Follow-up:  1. Follow up with PCP in 1-2 weeks 2. Please obtain BMP/CBC in one week 3. Please ensure follow-up with nephrology  Admitted From:  Home  Disposition: [place]: No  Equipment/Devices: None  Discharge Condition: Stable  CODE STATUS: FULL CODE  Diet recommendation:  Heart Healthy   Brief Summary: See H&P, Labs, Consult and Test reports for all details in brief, patient is a 62 year old male with history of ESRD on HD-who only had 1.5 hours of HD on Tuesday-presented to the hospital on Thursday with expressive aphasia and myoclonus.  He had a admission with similar complaints a few weeks back-and his symptoms resolved after hemodialysis-it was thought that his symptoms were related to uremia.  He was dialyzed on 12/5-this morning he has no signs of myoclonus or any sort of speech difficulties.  He is threatening to leave AMA and in fact has already called his ride to pick him up-he wants to be discharged right away hence he is being discharged home at his own request.  He appears to be clinically improved and on admission.  Brief Hospital Course: Myoclonic jerks/expressive aphasia: Thought to be uremia-he did not complete his last hemodialysis-and only received 1.5 hours instead of 3.5 hours.  He was dialyzed yesterday with complete resolution of symptoms.  Given rapid improvement with dialysis-his symptoms are most likely secondary uremia.  He had an extensive work-up just a few weeks back when he had this admission for similar reasons.  He was briefly counseled regarding importance of compliance with HD treatments-however he was not very receptive.  ESRD: Patient follow with his outpatient HD  center at his usual schedule.  Rest of his medical problems were stable during the short overnight hospital stay  Procedures/Studies: None  Discharge Diagnoses:  Principal Problem:   Myoclonic jerking Active Problems:   ESRD (end stage renal disease) on dialysis ([place])   Uremia   Hypertension   History of anemia due to CKD   Discharge Instructions:  Activity:  As tolerated with Full fall precautions use walker/cane & assistance as needed   Discharge Instructions    Call MD for:   Complete by:  As directed    Involuntary movements, speech difficulty   Diet - low sodium heart healthy   Complete by:  As directed    Discharge instructions   Complete by:  As directed    Follow with Primary MD  [name], PA-C in 1 week  Follow with your hemodialysis center at your usual schedule  Please get a complete blood count and chemistry panel checked by your Primary MD at your next visit, and again as instructed by your Primary MD.  Get Medicines reviewed and adjusted: Please take all your medications with you for your next visit with your Primary MD  Laboratory/radiological data: Please request your Primary MD to go over all hospital tests and procedure/radiological results at the follow up, please ask your Primary MD to get all Hospital records sent to his/her office.  In some cases, they will be blood work, cultures and biopsy results pending at the time of your discharge. Please request that your primary care M.D. follows up on these results.  Also Note the following: If you experience worsening of your admission symptoms,  develop shortness of breath, life threatening emergency, suicidal or homicidal thoughts you must seek medical attention immediately by calling 911 or calling your MD immediately  if symptoms less severe.  You must read complete instructions/literature along with all the possible adverse reactions/side effects for all the Medicines you take and that have  been prescribed to you. Take any new Medicines after you have completely understood and accpet all the possible adverse reactions/side effects.   Do not drive when taking Pain medications or sleeping medications (Benzodaizepines)  Do not take more than prescribed Pain, Sleep and Anxiety Medications. It is not advisable to combine anxiety,sleep and pain medications without talking with your primary care practitioner  Special Instructions: If you have smoked or chewed Tobacco  in the last 2 yrs please stop smoking, stop any regular Alcohol  and or any Recreational drug use.  Wear Seat belts while driving.  Please note: You were cared for by a hospitalist during your hospital stay. Once you are discharged, your primary care physician will handle any further medical issues. Please note that NO REFILLS for any discharge medications will be authorized once you are discharged, as it is imperative that you return to your primary care physician (or establish a relationship with a primary care physician if you do not have one) for your post hospital discharge needs so that they can reassess your need for medications and monitor your lab values.   Increase activity slowly   Complete by:  As directed      Allergies as of 10/07/2018   No Known Allergies     Medication List    TAKE these medications   amLODipine 10 MG tablet Commonly known as:  NORVASC Take 1 tablet (10 mg total) by mouth at bedtime.   cloNIDine 0.1 MG tablet Commonly known as:  CATAPRES Take 1 tablet (0.1 mg total) by mouth 2 (two) times daily.   cyanocobalamin 2000 MCG tablet Take 1 tablet (2,000 mcg total) by mouth daily.   feeding supplement (NEPRO CARB STEADY) Liqd Take 237 mLs by mouth 2 (two) times daily between meals.   HYDROcodone-acetaminophen 5-325 MG tablet Commonly known as:  NORCO/VICODIN Take 1-2 tablets by mouth every 6 (six) hours as needed for severe pain.   multivitamin Tabs tablet Take 1 tablet by mouth  at bedtime.   rOPINIRole 0.5 MG tablet Commonly known as:  REQUIP Take 1 tablet (0.5 mg total) by mouth daily.   sevelamer carbonate 800 MG tablet Commonly known as:  RENVELA Take 4 tablets (3,200 mg total) by mouth 3 (three) times daily with meals.   thiamine 100 MG tablet Take 1 tablet (100 mg total) by mouth daily.   UNABLE TO FIND BP monitor  Diagnosis: Hypertension      Follow-up Information    [name], PA-C. Schedule an appointment as soon as possible for a visit in 1 week(s).   Specialty:  Physician Assistant Contact information: [place] [address] [phone]        Hemodialysis Clinic Follow up.   Why:  at your usual schedule         No Known Allergies  Consultations:   nephrology  Other Procedures/Studies: Dg Shoulder Right  Result Date: 09/10/2018 CLINICAL DATA:  Generalized shoulder pain.  No definite injury. EXAM: RIGHT SHOULDER - 2+ VIEW COMPARISON:  None. FINDINGS: Humeral head appears properly located. No glenohumeral degenerative change is seen. Positioning is suboptimal on this exam. Ordinary mild degenerative change affects the  AC joint. No other regional abnormality seen. IMPRESSION: Negative radiographs, though positioning is suboptimal. Electronically Signed   By: Nelson Chimes M.D.   On: 09/10/2018 11:42   Ct Head Wo Contrast  Result Date: 10/06/2018 CLINICAL DATA:  62 year old male with end-stage renal disease, dialysis. Confusion. Posterior neck pain. Abnormal cervical spine on recent 09/29/2018 CT and MRI. EXAM: CT HEAD WITHOUT CONTRAST TECHNIQUE: Contiguous axial images were obtained from the base of the skull through the vertex without intravenous contrast. COMPARISON:  Brain MRI and CT 09/29/2018 and earlier. FINDINGS: Brain: No midline shift, ventriculomegaly, mass effect, evidence of mass lesion, intracranial hemorrhage or evidence of cortically based acute infarction. Stable chronic infarct in the left  cerebellum. Stable gray-white matter differentiation throughout the brain. Vascular: No suspicious intracranial vascular hyperdensity. Skull: Skull bone mineralization remains normal. Stable visible C1 and C2 vertebrae. No acute osseous abnormality identified. Sinuses/Orbits: Visualized paranasal sinuses and mastoids are stable and well pneumatized. Other: Visualized orbits and scalp soft tissues are within normal limits. IMPRESSION: 1. No acute intracranial abnormality. 2. Stable chronic left cerebellar infarct. Electronically Signed   By: Genevie Ann M.D.   On: 10/06/2018 07:55   Ct Head Wo Contrast  Result Date: 09/29/2018 CLINICAL DATA:  Focal neuro deficit, > 6 hrs, stroke suspected; Radiculopathy. Patient reports numbness and tingling of right arm for 1 month. EXAM: CT HEAD WITHOUT CONTRAST CT CERVICAL SPINE WITHOUT CONTRAST TECHNIQUE: Multidetector CT imaging of the head and cervical spine was performed following the standard protocol without intravenous contrast. Multiplanar CT image reconstructions of the cervical spine were also generated. COMPARISON:  Head CT and brain MRI 09/06/2018 FINDINGS: CT HEAD FINDINGS Brain: Remote left cerebellar infarct again seen. No intracranial hemorrhage, mass effect, or midline shift. No hydrocephalus. The basilar cisterns are patent. No evidence of territorial infarct or acute ischemia. No extra-axial or intracranial fluid collection. Vascular: No hyperdense vessel. Skull: No fracture or focal lesion. Sinuses/Orbits: No acute finding. Other: None. CT CERVICAL SPINE FINDINGS Alignment: Reversal of normal lordosis. Minimal retrolisthesis of C5 on C6 which appears chronic. Skull base and vertebrae: Sequela of renal osteodystrophy with bony sclerosis and diffuse endplate irregularities. Degenerative type flattening of C5 and C6 vertebral bodies. No acute fracture. The dens and skull base are intact. Soft tissues and spinal canal: No prevertebral fluid or swelling. No visible  canal hematoma. Disc levels: Diffuse degenerative disc disease. Posterior disc osteophyte complex at C5-C6 causes mass effect on the spinal canal. Right neural foraminal stenosis at C3-C4, C4-C5 and C5-C6 to a lesser extent. Upper chest: No acute finding. Other: None. IMPRESSION: 1. No acute intracranial abnormality. Remote left cerebellar infarct. 2. Multilevel degenerative change in the cervical spine without osseous abnormality. Posterior disc osteophyte complex at C5-C6 causes mass effect on the spinal canal. Right neural foraminal stenosis is most prominent at C3-C4. 3. Sequela of renal osteodystrophy. Electronically Signed   By: Keith Rake M.D.   On: 09/29/2018 06:05   Ct Cervical Spine Wo Contrast  Result Date: 09/29/2018 CLINICAL DATA:  Focal neuro deficit, > 6 hrs, stroke suspected; Radiculopathy. Patient reports numbness and tingling of right arm for 1 month. EXAM: CT HEAD WITHOUT CONTRAST CT CERVICAL SPINE WITHOUT CONTRAST TECHNIQUE: Multidetector CT imaging of the head and cervical spine was performed following the standard protocol without intravenous contrast. Multiplanar CT image reconstructions of the cervical spine were also generated. COMPARISON:  Head CT and brain MRI 09/06/2018 FINDINGS: CT HEAD FINDINGS Brain: Remote left cerebellar infarct again seen. No intracranial  hemorrhage, mass effect, or midline shift. No hydrocephalus. The basilar cisterns are patent. No evidence of territorial infarct or acute ischemia. No extra-axial or intracranial fluid collection. Vascular: No hyperdense vessel. Skull: No fracture or focal lesion. Sinuses/Orbits: No acute finding. Other: None. CT CERVICAL SPINE FINDINGS Alignment: Reversal of normal lordosis. Minimal retrolisthesis of C5 on C6 which appears chronic. Skull base and vertebrae: Sequela of renal osteodystrophy with bony sclerosis and diffuse endplate irregularities. Degenerative type flattening of C5 and C6 vertebral bodies. No acute  fracture. The dens and skull base are intact. Soft tissues and spinal canal: No prevertebral fluid or swelling. No visible canal hematoma. Disc levels: Diffuse degenerative disc disease. Posterior disc osteophyte complex at C5-C6 causes mass effect on the spinal canal. Right neural foraminal stenosis at C3-C4, C4-C5 and C5-C6 to a lesser extent. Upper chest: No acute finding. Other: None. IMPRESSION: 1. No acute intracranial abnormality. Remote left cerebellar infarct. 2. Multilevel degenerative change in the cervical spine without osseous abnormality. Posterior disc osteophyte complex at C5-C6 causes mass effect on the spinal canal. Right neural foraminal stenosis is most prominent at C3-C4. 3. Sequela of renal osteodystrophy. Electronically Signed   By: Keith Rake M.D.   On: 09/29/2018 06:05   Mr Brain Wo Contrast  Result Date: 09/29/2018 CLINICAL DATA:  62 year old male, end-stage renal disease on dialysis, right upper extremity pain numbness and tingling. EXAM: MRI HEAD WITHOUT CONTRAST TECHNIQUE: Multiplanar, multiecho pulse sequences of the brain and surrounding structures were obtained without intravenous contrast. COMPARISON:  Head CT 0453 hours today.  Brain MRI 09/06/2018. FINDINGS: Brain: No restricted diffusion or evidence of acute infarction. 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 No new signal abnormality. No midline shift, mass effect, evidence of mass lesion, ventriculomegaly, extra-axial collection or acute intracranial hemorrhage. Cervicomedullary junction and pituitary are within normal limits. No chronic cerebral blood products identified. Vascular: Major intracranial vascular flow voids are stable. Skull and upper cervical spine: Cervical spine findings today are reported separately. Skull bone marrow signal appears normal. Sinuses/Orbits: Negative orbits. Paranasal sinuses and mastoids are stable and well pneumatized. Other: Visible internal  auditory structures appear normal. Scalp and face soft tissues appear negative. IMPRESSION: 1.  No acute intracranial abnormality. 2. Stable noncontrast MRI appearance of the brain from earlier this month: Chronic small and medium-sized vessel infarcts in the cerebellum, right caudate. Electronically Signed   By: Genevie Ann M.D.   On: 09/29/2018 08:08   Mr Cervical Spine Wo Contrast  Addendum Date: 09/29/2018   ADDENDUM REPORT: 09/29/2018 08:42 ADDENDUM: Study discussed by telephone with Dr. Dolly Rias in the ED on 09/29/2018 at 08:42 . Electronically Signed   By: Genevie Ann M.D.   On: 09/29/2018 08:42   Result Date: 09/29/2018 CLINICAL DATA:  62 year old male, end-stage renal disease on dialysis, right upper extremity pain numbness and tingling. EXAM: MRI CERVICAL SPINE WITHOUT CONTRAST TECHNIQUE: Multiplanar, multisequence MR imaging of the cervical spine was performed. No intravenous contrast was administered. COMPARISON:  Cervical spine CT earlier today, CTA neck 09/06/2018 FINDINGS: Alignment: Reversal of cervical lordosis mildly increased since 09/06/2018. Vertebrae: Diffusely abnormal background bone marrow signal, decreased on T1, T2, and STIR. Superimposed heterogeneous increased T1 and T2 signal in the cervical vertebrae C4 through C7. Superimposed intermittent patchy, mild increased STIR signal at those levels. And abnormal STIR signal most notably at C3 where endplate bone resorption has been present since 09/06/2018. However, the intervening disc space signal at these levels remains fairly normal. Superimposed unhealed  C5 spinous process fracture with marrow edema and regional soft tissue abnormality (see below). Cord: Spinal cord signal is within normal limits at all visualized levels. Posterior Fossa, vertebral arteries, paraspinal tissues: Cervicomedullary junction is within normal limits. Preserved major vascular flow voids in the neck. Abnormal interspinous ligament signal at C5-C6 and to a lesser  extent the adjacent levels with superimposed un healed appearing C5 spinous process fracture with focal fluid or edema within the fracture cleft. Patchy medial erector spinae muscle edema more so on the right. Anteriorly there is bilateral longus coli muscle edema at the C3 and C4 levels plus trace prevertebral fluid (series 9, image 16). Disc levels: Disc degeneration except at C2-C3. Degenerative disc osteophyte complex at all other levels, which results in spinal stenosis C3-C4 through C6-C7 with up to mild spinal cord mass effect (maximal at C5-C6 on series 9, image 27). Associated widespread moderate and occasionally severe bilateral cervical neural foraminal stenosis. Severe foraminal stenosis at the right greater than left C4, left C5 nerve levels. IMPRESSION: 1. 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 Query clinical or laboratory findings suggestive of infection. 2. Unhealed C5 spinous process fracture with associated posterior ligamentous injury (interspinous ligament). 3. Anteriorly there is nonspecific longus coli muscle edema and trace prevertebral fluid, maximal at C3 and likely reactive due to #1. 4. 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 5. Severe degenerative neural foraminal stenosis at the bilateral C4 and left C5 nerve levels, with widespread moderate bilateral foraminal stenosis elsewhere. Electronically Signed: By: Genevie Ann M.D. On: 09/29/2018 08:25      TODAY-DAY OF DISCHARGE:  Subjective:   [place] today has no headache,no chest abdominal pain,no new weakness tingling or numbness, feels much better wants to go home today.  He is  agitated-belligerent-and is wanting to leave right away  Objective:   Blood pressure 122/76, pulse 66, temperature 98.6 F (37 C), temperature source Oral, resp. rate 18, height 5\' 5"  (1.651 m), weight 60.4 kg, SpO2 99 %.  Intake/Output Summary (Last 24 hours) at 10/07/2018 0835 Last data filed at 10/06/2018 1627 Gross per 24 hour  Intake -  Output 3390 ml  Net -3390 ml   Filed Weights   10/06/18 0435 10/06/18 1627  Weight: 68 kg 60.4 kg    Exam: Awake Alert, Oriented *3, No new F.N deficits, Normal affect [place].AT,PERRAL Supple Neck,No JVD, No cervical lymphadenopathy appriciated.  Symmetrical Chest wall movement, Good air movement bilaterally, CTAB RRR,No Gallops,Rubs or new Murmurs, No Parasternal Heave +ve B.Sounds, Abd Soft, Non tender, No organomegaly appriciated, No rebound -guarding or rigidity. No Cyanosis, Clubbing or edema, No new Rash or bruise   PERTINENT RADIOLOGIC STUDIES: Dg Shoulder Right  Result Date: 09/10/2018 CLINICAL DATA:  Generalized shoulder pain.  No definite injury. EXAM: RIGHT SHOULDER - 2+ VIEW COMPARISON:  None. FINDINGS: Humeral head appears properly located. No glenohumeral degenerative change is seen. Positioning is suboptimal on this exam. Ordinary mild degenerative change affects the [facility] joint. No other regional abnormality seen. IMPRESSION: Negative radiographs, though positioning is suboptimal. Electronically Signed   By: Nelson Chimes M.D.   On: 09/10/2018 11:42   Ct Head Wo Contrast  Result Date: 10/06/2018 CLINICAL DATA:  62 year old male with end-stage renal disease,  dialysis. Confusion. Posterior neck pain. Abnormal cervical spine on recent 09/29/2018 CT and MRI. EXAM: CT HEAD WITHOUT CONTRAST TECHNIQUE: Contiguous axial images were obtained from the base of the skull through the vertex without intravenous contrast. COMPARISON:  Brain MRI and CT 09/29/2018 and earlier. FINDINGS: Brain: No midline shift, ventriculomegaly, mass effect, evidence of  mass lesion, intracranial hemorrhage or evidence of cortically based acute infarction. Stable chronic infarct in the left cerebellum. Stable gray-white matter differentiation throughout the brain. Vascular: No suspicious intracranial vascular hyperdensity. Skull: Skull bone mineralization remains normal. Stable visible C1 and C2 vertebrae. No acute osseous abnormality identified. Sinuses/Orbits: Visualized paranasal sinuses and mastoids are stable and well pneumatized. Other: Visualized orbits and scalp soft tissues are within normal limits. IMPRESSION: 1. No acute intracranial abnormality. 2. Stable chronic left cerebellar infarct. Electronically Signed   By: Genevie Ann M.D.   On: 10/06/2018 07:55   Ct Head Wo Contrast  Result Date: 09/29/2018 CLINICAL DATA:  Focal neuro deficit, > 6 hrs, stroke suspected; Radiculopathy. Patient reports numbness and tingling of right arm for 1 month. EXAM: CT HEAD WITHOUT CONTRAST CT CERVICAL SPINE WITHOUT CONTRAST TECHNIQUE: Multidetector CT imaging of the head and cervical spine was performed following the standard protocol without intravenous contrast. Multiplanar CT image reconstructions of the cervical spine were also generated. COMPARISON:  Head CT and brain MRI 09/06/2018 FINDINGS: CT HEAD FINDINGS Brain: Remote left cerebellar infarct again seen. No intracranial hemorrhage, mass effect, or midline shift. No hydrocephalus. The basilar cisterns are patent. No evidence of territorial infarct or acute ischemia. No extra-axial or intracranial fluid collection. Vascular: No hyperdense vessel. Skull: No fracture or focal lesion. Sinuses/Orbits: No acute finding. Other: None. CT CERVICAL SPINE FINDINGS Alignment: Reversal of normal lordosis. Minimal retrolisthesis of C5 on C6 which appears chronic. Skull base and vertebrae: Sequela of renal osteodystrophy with bony sclerosis and diffuse endplate irregularities. Degenerative type flattening of C5 and C6 vertebral bodies. No acute  fracture. The dens and skull base are intact. Soft tissues and spinal canal: No prevertebral fluid or swelling. No visible canal hematoma. Disc levels: Diffuse degenerative disc disease. Posterior disc osteophyte complex at C5-C6 causes mass effect on the spinal canal. Right neural foraminal stenosis at C3-C4, C4-C5 and C5-C6 to a lesser extent. Upper chest: No acute finding. Other: None. IMPRESSION: 1. No acute intracranial abnormality. Remote left cerebellar infarct. 2. Multilevel degenerative change in the cervical spine without osseous abnormality. Posterior disc osteophyte complex at C5-C6 causes mass effect on the spinal canal. Right neural foraminal stenosis is most prominent at C3-C4. 3. Sequela of renal osteodystrophy. Electronically Signed   By: Keith Rake M.D.   On: 09/29/2018 06:05   Ct Cervical Spine Wo Contrast  Result Date: 09/29/2018 CLINICAL DATA:  Focal neuro deficit, > 6 hrs, stroke suspected; Radiculopathy. Patient reports numbness and tingling of right arm for 1 month. EXAM: CT HEAD WITHOUT CONTRAST CT CERVICAL SPINE WITHOUT CONTRAST TECHNIQUE: Multidetector CT imaging of the head and cervical spine was performed following the standard protocol without intravenous contrast. Multiplanar CT image reconstructions of the cervical spine were also generated. COMPARISON:  Head CT and brain MRI 09/06/2018 FINDINGS: CT HEAD FINDINGS Brain: Remote left cerebellar infarct again seen. No intracranial hemorrhage, mass effect, or midline shift. No hydrocephalus. The basilar cisterns are patent. No evidence of territorial infarct or acute ischemia. No extra-axial or intracranial fluid collection. Vascular: No hyperdense vessel. Skull: No fracture or focal lesion. Sinuses/Orbits: No acute finding. Other: None. CT CERVICAL SPINE FINDINGS  Alignment: Reversal of normal lordosis. Minimal retrolisthesis of C5 on C6 which appears chronic. Skull base and vertebrae: Sequela of renal osteodystrophy with bony  sclerosis and diffuse endplate irregularities. Degenerative type flattening of C5 and C6 vertebral bodies. No acute fracture. The dens and skull base are intact. Soft tissues and spinal canal: No prevertebral fluid or swelling. No visible canal hematoma. Disc levels: Diffuse degenerative disc disease. Posterior disc osteophyte complex at C5-C6 causes mass effect on the spinal canal. Right neural foraminal stenosis at C3-C4, C4-C5 and C5-C6 to a lesser extent. Upper chest: No acute finding. Other: None. IMPRESSION: 1. No acute intracranial abnormality. Remote left cerebellar infarct. 2. Multilevel degenerative change in the cervical spine without osseous abnormality. Posterior disc osteophyte complex at C5-C6 causes mass effect on the spinal canal. Right neural foraminal stenosis is most prominent at C3-C4. 3. Sequela of renal osteodystrophy. Electronically Signed   By: Keith Rake M.D.   On: 09/29/2018 06:05   Mr Brain Wo Contrast  Result Date: 09/29/2018 CLINICAL DATA:  62 year old male, end-stage renal disease on dialysis, right upper extremity pain numbness and tingling. EXAM: MRI HEAD WITHOUT CONTRAST TECHNIQUE: Multiplanar, multiecho pulse sequences of the brain and surrounding structures were obtained without intravenous contrast. COMPARISON:  Head CT 0453 hours today.  Brain MRI 09/06/2018. FINDINGS: Brain: No restricted diffusion or evidence of acute infarction. 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 No new signal abnormality. No midline shift, mass effect, evidence of mass lesion, ventriculomegaly, extra-axial collection or acute intracranial hemorrhage. Cervicomedullary junction and pituitary are within normal limits. No chronic cerebral blood products identified. Vascular: Major intracranial vascular flow voids are stable. Skull and upper cervical spine: Cervical spine findings today are reported separately. Skull bone marrow signal appears normal.  Sinuses/Orbits: Negative orbits. Paranasal sinuses and mastoids are stable and well pneumatized. Other: Visible internal auditory structures appear normal. Scalp and face soft tissues appear negative. IMPRESSION: 1.  No acute intracranial abnormality. 2. Stable noncontrast MRI appearance of the brain from earlier this month: Chronic small and medium-sized vessel infarcts in the cerebellum, right caudate. Electronically Signed   By: Genevie Ann M.D.   On: 09/29/2018 08:08   Mr Cervical Spine Wo Contrast  Addendum Date: 09/29/2018   ADDENDUM REPORT: 09/29/2018 08:42 ADDENDUM: Study discussed by telephone with Dr. Dolly Rias in the ED on 09/29/2018 at 08:42 . Electronically Signed   By: Genevie Ann M.D.   On: 09/29/2018 08:42   Result Date: 09/29/2018 CLINICAL DATA:  62 year old male, end-stage renal disease on dialysis, right upper extremity pain numbness and tingling. EXAM: MRI CERVICAL SPINE WITHOUT CONTRAST TECHNIQUE: Multiplanar, multisequence MR imaging of the cervical spine was performed. No intravenous contrast was administered. COMPARISON:  Cervical spine CT earlier today, CTA neck 09/06/2018 FINDINGS: Alignment: Reversal of cervical lordosis mildly increased since 09/06/2018. Vertebrae: Diffusely abnormal background bone marrow signal, decreased on T1, T2, and STIR. Superimposed heterogeneous increased T1 and T2 signal in the cervical vertebrae C4 through C7. Superimposed intermittent patchy, mild increased STIR signal at those levels. And abnormal STIR signal most notably at C3 where endplate bone resorption has been present since 09/06/2018. However, the intervening disc space signal at these levels remains fairly normal. Superimposed unhealed C5 spinous process fracture with marrow edema and regional soft tissue abnormality (see below). Cord: Spinal cord signal is within normal limits at all visualized levels. Posterior Fossa, vertebral arteries, paraspinal tissues: Cervicomedullary junction is within normal  limits. Preserved major vascular flow voids in the neck.  Abnormal interspinous ligament signal at C5-C6 and to a lesser extent the adjacent levels with superimposed un healed appearing C5 spinous process fracture with focal fluid or edema within the fracture cleft. Patchy medial erector spinae muscle edema more so on the right. Anteriorly there is bilateral longus coli muscle edema at the C3 and C4 levels plus trace prevertebral fluid (series 9, image 16). Disc levels: Disc degeneration except at C2-C3. Degenerative disc osteophyte complex at all other levels, which results in spinal stenosis C3-C4 through C6-C7 with up to mild spinal cord mass effect (maximal at C5-C6 on series 9, image 27). Associated widespread moderate and occasionally severe bilateral cervical neural foraminal stenosis. Severe foraminal stenosis at the right greater than left C4, left C5 nerve levels. IMPRESSION: 1. 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 Query clinical or laboratory findings suggestive of infection. 2. Unhealed C5 spinous process fracture with associated posterior ligamentous injury (interspinous ligament). 3. Anteriorly there is nonspecific longus coli muscle edema and trace prevertebral fluid, maximal at C3 and likely reactive due to #1. 4. 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 5. Severe degenerative neural foraminal stenosis at the bilateral C4 and left C5 nerve levels, with widespread moderate bilateral foraminal stenosis elsewhere. Electronically Signed: By: Genevie Ann M.D. On: 09/29/2018 08:25     PERTINENT LAB RESULTS: CBC: Recent Labs    10/06/18 0537 10/07/18 0344  WBC 3.6*  2.7*  HGB 8.8* 8.9*  HCT 28.9* 28.7*  PLT 138* 127*   CMET CMP     Component Value Date/Time   NA 137 10/07/2018 0344   NA 146 (H) 05/16/2018 0830   K 5.1 10/07/2018 0344   CL 94 (L) 10/07/2018 0344   CO2 27 10/07/2018 0344   GLUCOSE 94 10/07/2018 0344   BUN 26 (H) 10/07/2018 0344   BUN 49 (H) 05/16/2018 0830   CREATININE 10.48 (H) 10/07/2018 0344   CALCIUM 9.1 10/07/2018 0344   CALCIUM 9.1 09/07/2018 2249   PROT 7.7 10/06/2018 0537   PROT 7.1 05/16/2018 0830   ALBUMIN 3.8 10/06/2018 0537   ALBUMIN 4.2 05/16/2018 0830   AST 33 10/06/2018 0537   ALT 26 10/06/2018 0537   ALKPHOS 64 10/06/2018 0537   BILITOT 0.5 10/06/2018 0537   BILITOT 0.3 05/16/2018 0830   GFRNONAA 5 (L) 10/07/2018 0344   GFRAA 6 (L) 10/07/2018 0344    GFR Estimated Creatinine Clearance: 6.4 mL/min (A) (by C-G formula based on SCr of 10.48 mg/dL (H)). No results for input(s): LIPASE, AMYLASE in the last 72 hours. No results for input(s): CKTOTAL, CKMB, CKMBINDEX, TROPONINI in the last 72 hours. Invalid input(s): POCBNP No results for input(s): DDIMER in the last 72 hours. No results for input(s): HGBA1C in the last 72 hours. No results for input(s): CHOL, HDL, LDLCALC, TRIG, CHOLHDL, LDLDIRECT in the last 72 hours. No results for input(s): TSH, T4TOTAL, T3FREE, THYROIDAB in the last 72 hours.  Invalid input(s): FREET3 No results for input(s): VITAMINB12, FOLATE, FERRITIN, TIBC, IRON, RETICCTPCT in the last 72 hours. Coags: No results for input(s): INR in the last 72 hours.  Invalid input(s): PT Microbiology: Recent Results (from the past 240 hour(s))  Blood culture (routine x 2)     Status: None  Collection Time: 09/29/18  9:10 AM  Result Value Ref Range Status   Specimen Description BLOOD BLOOD RIGHT HAND  Final   Special Requests   Final    BOTTLES DRAWN AEROBIC AND ANAEROBIC Blood Culture adequate volume   Culture   Final    NO GROWTH 5 DAYS Performed at [facility], [address], [place], [place] [address]    Report Status 10/04/2018 FINAL  Final  Blood culture (routine x 2)     Status: None   Collection Time: 09/29/18  9:15 AM  Result Value Ref Range Status   Specimen Description BLOOD BLOOD LEFT HAND  Final   Special Requests   Final    BOTTLES DRAWN AEROBIC AND ANAEROBIC Blood Culture adequate volume   Culture   Final    NO GROWTH 5 DAYS Performed at [facility], [place] [address], [place], [place] [address]    Report Status 10/04/2018 FINAL  Final  MRSA PCR Screening     Status: None   Collection Time: 10/06/18  6:45 PM  Result Value Ref Range Status   MRSA by PCR NEGATIVE NEGATIVE Final    Comment:        The GeneXpert MRSA Assay (FDA approved for NASAL specimens only), is one component of a comprehensive MRSA colonization surveillance program. It is not intended to diagnose MRSA infection nor to guide or monitor treatment for MRSA infections. Performed at [facility], [place] [address], [place], [place] [address]     FURTHER DISCHARGE INSTRUCTIONS:  Get Medicines reviewed and adjusted: Please take all your medications with you for your next visit with your Primary MD  Laboratory/radiological data: Please request your Primary MD to go over all hospital tests and procedure/radiological results at the follow up, please ask your Primary MD to get all Hospital records sent to his/her office.  In some cases, they will be blood work, cultures and biopsy results pending at the time of your discharge. Please request that your primary care M.D. goes through all the records of your hospital data and follows up on these results.  Also Note the following: If you experience worsening of your admission symptoms, develop shortness of breath, life threatening emergency, suicidal or homicidal thoughts you must seek medical attention immediately by calling 911 or calling your MD immediately  if symptoms less severe.  You must read complete  instructions/literature along with all the possible adverse reactions/side effects for all the Medicines you take and that have been prescribed to you. Take any new Medicines after you have completely understood and accpet all the possible adverse reactions/side effects.   Do not drive when taking Pain medications or sleeping medications (Benzodaizepines)  Do not take more than prescribed Pain, Sleep and Anxiety Medications. It is not advisable to combine anxiety,sleep and pain medications without talking with your primary care practitioner  Special Instructions: If you have smoked or chewed Tobacco  in the last 2 yrs please stop smoking, stop any regular Alcohol  and or any Recreational drug use.  Wear Seat belts while driving.  Please note: You were cared for by a hospitalist during your hospital stay. Once you are discharged, your primary care physician will handle any further medical issues. Please note that NO REFILLS for any discharge medications will be authorized once you are discharged, as it is imperative that you return to your primary care physician (or establish a relationship with a primary care physician if you do not  have one) for your post hospital discharge needs so that they can reassess your need for medications and monitor your lab values.  Total Time spent coordinating discharge including counseling, education and face to face time equals 35 minutes.  SignedOren Binet 10/07/2018 8:35 AM

## 2018-10-07 NOTE — Plan of Care (Signed)
Nsg Discharge Note  Admit Date:  10/06/2018 Discharge date: 10/07/2018   Wilho Sharpley to be D/C'd Home per MD order.  AVS completed.  Copy for chart, and copy for patient signed, and dated. Patient/caregiver able to verbalize understanding.  Discharge Medication: Allergies as of 10/07/2018   No Known Allergies      Medication List     TAKE these medications    amLODipine 10 MG tablet Commonly known as:  NORVASC Take 1 tablet (10 mg total) by mouth at bedtime.   cloNIDine 0.1 MG tablet Commonly known as:  CATAPRES Take 1 tablet (0.1 mg total) by mouth 2 (two) times daily.   cyanocobalamin 2000 MCG tablet Take 1 tablet (2,000 mcg total) by mouth daily.   feeding supplement (NEPRO CARB STEADY) Liqd Take 237 mLs by mouth 2 (two) times daily between meals.   HYDROcodone-acetaminophen 5-325 MG tablet Commonly known as:  NORCO/VICODIN Take 1-2 tablets by mouth every 6 (six) hours as needed for severe pain.   multivitamin Tabs tablet Take 1 tablet by mouth at bedtime.   rOPINIRole 0.5 MG tablet Commonly known as:  REQUIP Take 1 tablet (0.5 mg total) by mouth daily.   sevelamer carbonate 800 MG tablet Commonly known as:  RENVELA Take 4 tablets (3,200 mg total) by mouth 3 (three) times daily with meals.   thiamine 100 MG tablet Take 1 tablet (100 mg total) by mouth daily.   UNABLE TO FIND BP monitor  Diagnosis: Hypertension        Discharge Assessment: Vitals:   10/06/18 2148 10/07/18 0600  BP: (!) 136/95 122/76  Pulse: 73 66  Resp: 18 18  Temp: 98.6 F (37 C) 98.6 F (37 C)  SpO2: 99% 99%   Skin clean, dry and intact without evidence of skin break down, no evidence of skin tears noted. IV catheter discontinued intact. Site without signs and symptoms of complications - no redness or edema noted at insertion site, patient denies c/o pain - only slight tenderness at site.  Dressing with slight pressure applied.  D/c Instructions-Education: Discharge  instructions given to patient/family with verbalized understanding. D/c education completed with patient/family including follow up instructions, medication list, d/c activities limitations if indicated, with other d/c instructions as indicated by MD - patient able to verbalize understanding, all questions fully answered. Patient instructed to return to ED, call 911, or call MD for any changes in condition.  Patient escorted via [place], and D/C home via private auto.  Salley Slaughter, RN 10/07/2018 8:47 AM

## 2018-10-08 DIAGNOSIS — E875 Hyperkalemia: Secondary | ICD-10-CM | POA: Diagnosis not present

## 2018-10-08 DIAGNOSIS — N2581 Secondary hyperparathyroidism of renal origin: Secondary | ICD-10-CM | POA: Diagnosis not present

## 2018-10-08 DIAGNOSIS — D631 Anemia in chronic kidney disease: Secondary | ICD-10-CM | POA: Diagnosis not present

## 2018-10-08 DIAGNOSIS — N186 End stage renal disease: Secondary | ICD-10-CM | POA: Diagnosis not present

## 2018-10-11 DIAGNOSIS — N2581 Secondary hyperparathyroidism of renal origin: Secondary | ICD-10-CM | POA: Diagnosis not present

## 2018-10-11 DIAGNOSIS — N186 End stage renal disease: Secondary | ICD-10-CM | POA: Diagnosis not present

## 2018-10-11 DIAGNOSIS — E875 Hyperkalemia: Secondary | ICD-10-CM | POA: Diagnosis not present

## 2018-10-11 DIAGNOSIS — D631 Anemia in chronic kidney disease: Secondary | ICD-10-CM | POA: Diagnosis not present

## 2018-10-13 DIAGNOSIS — N186 End stage renal disease: Secondary | ICD-10-CM | POA: Diagnosis not present

## 2018-10-13 DIAGNOSIS — N2581 Secondary hyperparathyroidism of renal origin: Secondary | ICD-10-CM | POA: Diagnosis not present

## 2018-10-13 DIAGNOSIS — D631 Anemia in chronic kidney disease: Secondary | ICD-10-CM | POA: Diagnosis not present

## 2018-10-13 DIAGNOSIS — E875 Hyperkalemia: Secondary | ICD-10-CM | POA: Diagnosis not present

## 2018-10-15 DIAGNOSIS — N2581 Secondary hyperparathyroidism of renal origin: Secondary | ICD-10-CM | POA: Diagnosis not present

## 2018-10-15 DIAGNOSIS — D631 Anemia in chronic kidney disease: Secondary | ICD-10-CM | POA: Diagnosis not present

## 2018-10-15 DIAGNOSIS — N186 End stage renal disease: Secondary | ICD-10-CM | POA: Diagnosis not present

## 2018-10-15 DIAGNOSIS — E875 Hyperkalemia: Secondary | ICD-10-CM | POA: Diagnosis not present

## 2018-10-18 DIAGNOSIS — D631 Anemia in chronic kidney disease: Secondary | ICD-10-CM | POA: Diagnosis not present

## 2018-10-18 DIAGNOSIS — N2581 Secondary hyperparathyroidism of renal origin: Secondary | ICD-10-CM | POA: Diagnosis not present

## 2018-10-18 DIAGNOSIS — E875 Hyperkalemia: Secondary | ICD-10-CM | POA: Diagnosis not present

## 2018-10-18 DIAGNOSIS — N186 End stage renal disease: Secondary | ICD-10-CM | POA: Diagnosis not present

## 2018-10-19 ENCOUNTER — Encounter (INDEPENDENT_AMBULATORY_CARE_PROVIDER_SITE_OTHER): Payer: Self-pay | Admitting: Physician Assistant

## 2018-10-19 ENCOUNTER — Ambulatory Visit (INDEPENDENT_AMBULATORY_CARE_PROVIDER_SITE_OTHER): Payer: Medicare Other | Admitting: Physician Assistant

## 2018-10-19 ENCOUNTER — Other Ambulatory Visit: Payer: Self-pay

## 2018-10-19 VITALS — BP 143/87 | HR 71 | Temp 98.1°F | Ht 65.0 in | Wt 136.0 lb

## 2018-10-19 DIAGNOSIS — N186 End stage renal disease: Secondary | ICD-10-CM

## 2018-10-19 DIAGNOSIS — M542 Cervicalgia: Secondary | ICD-10-CM

## 2018-10-19 DIAGNOSIS — R202 Paresthesia of skin: Secondary | ICD-10-CM

## 2018-10-19 DIAGNOSIS — Z992 Dependence on renal dialysis: Secondary | ICD-10-CM

## 2018-10-19 DIAGNOSIS — Z09 Encounter for follow-up examination after completed treatment for conditions other than malignant neoplasm: Secondary | ICD-10-CM

## 2018-10-19 DIAGNOSIS — N19 Unspecified kidney failure: Secondary | ICD-10-CM

## 2018-10-19 MED ORDER — PREGABALIN 25 MG PO CAPS: 25.0000 mg | ORAL_CAPSULE | Freq: Every day | ORAL | 1 refills | Status: DC

## 2018-10-19 NOTE — Progress Notes (Signed)
Subjective:  Patient ID: Maurice Bell, male    DOB: Oct 20, 1956  Age: 62 y.o. MRN: [id number]  CC: hospital f/u  HPI Maurice Bell a 62 y.o.malewith a medical history of anxiety, hepatitis C, HSV2, HTN, ESRD, and stroke presents on hospital f/u. Went to ED on 10/06/18 with slurred speech, difficulty speaking, and cognitive impairment. He was dialyzed and temporarily returned to baseline. However, recurrence of neurological symptoms occurred and patient was admitted. Hospitalist team did not observe recurrence of choreiform movements or speech difficulties but seemed to want to observe further. Unfortunately, patient left AMA. Pt was thought to have had uremia. Pt was also advised to stop smoking marijuana. Pt says he feels well and is going to dialysis regularly Tuesday, Thursday, and Saturday. Spoke to his nephrologist yesterday about his latest hospital admission and says nephrologist plans "bone testing".       Only complaint today is of neck pain. Says he is managed by orthopedics and was reportedly told he will likely need to see a neurosurgeon. Has been taking Excedrin with some relief but was told he should stop taking Excedrin by his nephrologist.      Outpatient Medications Prior to Visit  Medication Sig Dispense Refill  . amLODipine (NORVASC) 10 MG tablet Take 1 tablet (10 mg total) by mouth at bedtime. 30 tablet 5  . cloNIDine (CATAPRES) 0.1 MG tablet Take 1 tablet (0.1 mg total) by mouth 2 (two) times daily. 60 tablet 5  . multivitamin (RENA-VIT) TABS tablet Take 1 tablet by mouth at bedtime. 30 tablet 3  . Nutritional Supplements (FEEDING SUPPLEMENT, NEPRO CARB STEADY,) LIQD Take 237 mLs by mouth 2 (two) times daily between meals. 60 Can 0  . rOPINIRole (REQUIP) 0.5 MG tablet Take 1 tablet (0.5 mg total) by mouth daily. 30 tablet 3  . sevelamer carbonate (RENVELA) 800 MG tablet Take 4 tablets (3,200 mg total) by mouth 3 (three) times daily with meals. 180 tablet 5  .  thiamine 100 MG tablet Take 1 tablet (100 mg total) by mouth daily. 30 tablet 3  . UNABLE TO FIND BP monitor  Diagnosis: Hypertension 1 Mutually Defined 0  . vitamin B-12 2000 MCG tablet Take 1 tablet (2,000 mcg total) by mouth daily. 30 tablet 3  . HYDROcodone-acetaminophen (NORCO/VICODIN) 5-325 MG tablet Take 1-2 tablets by mouth every 6 (six) hours as needed for severe pain. 10 tablet 0   No facility-administered medications prior to visit.      ROS Review of Systems  Constitutional: Negative for chills, fever and malaise/fatigue.  Eyes: Negative for blurred vision.  Respiratory: Negative for shortness of breath.   Cardiovascular: Negative for chest pain and palpitations.  Gastrointestinal: Negative for abdominal pain and nausea.  Genitourinary: Negative for dysuria and hematuria.  Musculoskeletal: Positive for neck pain. Negative for joint pain and myalgias.  Skin: Negative for rash.  Neurological: Positive for tingling. Negative for headaches.  Psychiatric/Behavioral: Negative for depression. The patient is not nervous/anxious.     Objective:  BP (!) 143/87 (BP Location: Left Arm, Patient Position: Sitting, Cuff Size: Normal)   Pulse 71   Temp 98.1 F (36.7 C) (Oral)   Ht 5\' 5"  (1.651 m)   Wt 136 lb (61.7 kg)   SpO2 100%   BMI 22.63 kg/m   BP/Weight 10/19/2018 10/07/2018 08/02/7509  Systolic BP 258 527 -  Diastolic BP 87 76 -  Wt. (Lbs) 136 - 133.16  BMI 22.63 - 22.16      Physical Exam Vitals  signs reviewed.  Constitutional:      Comments: Well developed, well nourished, NAD  HENT:     Head: Normocephalic and atraumatic.  Eyes:     General: No scleral icterus. Neck:     Musculoskeletal: Normal range of motion and neck supple.     Thyroid: No thyromegaly.  Cardiovascular:     Rate and Rhythm: Normal rate and regular rhythm.     Heart sounds: Normal heart sounds.  Pulmonary:     Effort: Pulmonary effort is normal.     Breath sounds: Normal breath sounds.   Musculoskeletal:     Comments: Full aROM of neck  Skin:    General: Skin is warm and dry.     Coloration: Skin is not pale.     Findings: No erythema or rash.  Neurological:     Mental Status: He is alert and oriented to person, place, and time.  Psychiatric:        Behavior: Behavior normal.        Thought Content: Thought content normal.      Assessment & Plan:    1. Hospital discharge follow-up - Basic Metabolic Panel - CBC with Differential  2. Uremia - hospital f/u. No cognitive deficit, choreiform movements, or speech disturbances ttoday. - Basic Metabolic Panel - CBC with Differential  3. ESRD on dialysis [facility]) - Continue management with nephrologist. - Basic Metabolic Panel - CBC with Differential  4. Cervicalgia - Begin renally dosed pregabalin (LYRICA) 25 MG capsule; Take 1 capsule (25  mg total) by mouth daily.  Dispense: 30 capsule; Refill: 1 - Pt reported ortho will be sending to neurosurgeon   5. Paresthesia - Begin renally dosed pregabalin (LYRICA) 25 MG capsule; Take 1 capsule (25 mg total) by mouth daily.  Dispense: 30 capsule; Refill: 1 - Pt reported ortho will be sending to neurosurgeon  Meds ordered this encounter  Medications  . pregabalin (LYRICA) 25 MG capsule    Sig: Take 1 capsule (25 mg total) by mouth daily.    Dispense:  30 capsule    Refill:  1    Order Specific Question:   Supervising Provider    Answer:   Charlott Rakes [[id number]]    Follow-up: Return in about 8 weeks (around 12/14/2018).   [name] PA

## 2018-10-19 NOTE — Patient Instructions (Signed)
Cervical Radiculopathy    Cervical radiculopathy means that a nerve in the neck is pinched or bruised. This can cause pain or loss of feeling (numbness) that runs from your neck to your arm and fingers.  Follow these instructions at home:  Managing pain  · Take over-the-counter and prescription medicines only as told by your doctor.  · If directed, put ice on the injured or painful area.  ? Put ice in a plastic bag.  ? Place a towel between your skin and the bag.  ? Leave the ice on for 20 minutes, 2-3 times per day.  · If ice does not help, you can try using heat. Take a warm shower or warm bath, or use a heat pack as told by your doctor.  · You may try a gentle neck and shoulder massage.  Activity  · Rest as needed. Follow instructions from your doctor about any activities to avoid.  · Do exercises as told by your doctor or physical therapist.  General instructions  · If you were given a soft collar, wear it as told by your doctor.  · Use a flat pillow when you sleep.  · Keep all follow-up visits as told by your doctor. This is important.  Contact a doctor if:  · Your condition does not improve with treatment.  Get help right away if:  · Your pain gets worse and is not controlled with medicine.  · You lose feeling or feel weak in your hand, arm, face, or leg.  · You have a fever.  · You have a stiff neck.  · You cannot control when you poop or pee (have incontinence).  · You have trouble with walking, balance, or talking.  This information is not intended to replace advice given to you by your health care provider. Make sure you discuss any questions you have with your health care provider.  Document Released: 10/08/2011 Document Revised: 03/26/2016 Document Reviewed: 12/13/2014  Elsevier Interactive Patient Education © 2019 [employer]

## 2018-10-20 ENCOUNTER — Telehealth (INDEPENDENT_AMBULATORY_CARE_PROVIDER_SITE_OTHER): Payer: Self-pay

## 2018-10-20 DIAGNOSIS — D631 Anemia in chronic kidney disease: Secondary | ICD-10-CM | POA: Diagnosis not present

## 2018-10-20 DIAGNOSIS — N2581 Secondary hyperparathyroidism of renal origin: Secondary | ICD-10-CM | POA: Diagnosis not present

## 2018-10-20 DIAGNOSIS — E875 Hyperkalemia: Secondary | ICD-10-CM | POA: Diagnosis not present

## 2018-10-20 DIAGNOSIS — N186 End stage renal disease: Secondary | ICD-10-CM | POA: Diagnosis not present

## 2018-10-20 LAB — BASIC METABOLIC PANEL
BUN/Creatinine Ratio: 4 — ABNORMAL LOW (ref 10–24)
BUN: 43 mg/dL — ABNORMAL HIGH (ref 8–27)
CO2: 26 mmol/L (ref 20–29)
Calcium: 10 mg/dL (ref 8.6–10.2)
Chloride: 97 mmol/L (ref 96–106)
Creatinine, Ser: 9.65 mg/dL — ABNORMAL HIGH (ref 0.76–1.27)
GFR calc Af Amer: 6 mL/min/{1.73_m2} — ABNORMAL LOW (ref >59)
GFR, EST NON AFRICAN AMERICAN: 5 mL/min/{1.73_m2} — AB (ref >59)
Glucose: 82 mg/dL (ref 65–99)
Potassium: 5.8 mmol/L — ABNORMAL HIGH (ref 3.5–5.2)
Sodium: 141 mmol/L (ref 134–144)

## 2018-10-20 LAB — CBC WITH DIFFERENTIAL/PLATELET
Basophils Absolute: 0 10*3/uL (ref 0.0–0.2)
Basos: 1 %
EOS (ABSOLUTE): 0.1 10*3/uL (ref 0.0–0.4)
Eos: 4 %
Hematocrit: 25.5 % — ABNORMAL LOW (ref 37.5–51.0)
Hemoglobin: 8.9 g/dL — ABNORMAL LOW (ref 13.0–17.7)
Immature Grans (Abs): 0 10*3/uL (ref 0.0–0.1)
Immature Granulocytes: 0 %
LYMPHS ABS: 0.9 10*3/uL (ref 0.7–3.1)
Lymphs: 24 %
MCH: 35.6 pg — ABNORMAL HIGH (ref 26.6–33.0)
MCHC: 34.9 g/dL (ref 31.5–35.7)
MCV: 102 fL — AB (ref 79–97)
Monocytes Absolute: 0.5 10*3/uL (ref 0.1–0.9)
Monocytes: 13 %
Neutrophils Absolute: 2.1 10*3/uL (ref 1.4–7.0)
Neutrophils: 58 %
Platelets: 183 10*3/uL (ref 150–450)
RBC: 2.5 x10E6/uL — CL (ref 4.14–5.80)
RDW: 14.5 % (ref 12.3–15.4)
WBC: 3.6 10*3/uL (ref 3.4–10.8)

## 2018-10-20 NOTE — Telephone Encounter (Signed)
Patient is aware that potassium is mildly elevated, will recheck at next office visit. Has anemia of chronic disease, patient already receiving procrit injections. [name], CMA

## 2018-10-20 NOTE — Telephone Encounter (Signed)
-----   Message from [name], PA-C sent at 10/20/2018  8:45 AM EST ----- Potassium is mildly elevated. Will recheck at next visit. Has anemia of chronic disease and should ask his nephrologist about receiving Epogen or Procrit injections.

## 2018-10-22 DIAGNOSIS — N2581 Secondary hyperparathyroidism of renal origin: Secondary | ICD-10-CM | POA: Diagnosis not present

## 2018-10-22 DIAGNOSIS — E875 Hyperkalemia: Secondary | ICD-10-CM | POA: Diagnosis not present

## 2018-10-22 DIAGNOSIS — N186 End stage renal disease: Secondary | ICD-10-CM | POA: Diagnosis not present

## 2018-10-22 DIAGNOSIS — D631 Anemia in chronic kidney disease: Secondary | ICD-10-CM | POA: Diagnosis not present

## 2018-10-24 DIAGNOSIS — E875 Hyperkalemia: Secondary | ICD-10-CM | POA: Diagnosis not present

## 2018-10-24 DIAGNOSIS — N186 End stage renal disease: Secondary | ICD-10-CM | POA: Diagnosis not present

## 2018-10-24 DIAGNOSIS — N2581 Secondary hyperparathyroidism of renal origin: Secondary | ICD-10-CM | POA: Diagnosis not present

## 2018-10-24 DIAGNOSIS — D631 Anemia in chronic kidney disease: Secondary | ICD-10-CM | POA: Diagnosis not present

## 2018-10-27 ENCOUNTER — Emergency Department (HOSPITAL_COMMUNITY): Payer: Medicare Other

## 2018-10-27 ENCOUNTER — Other Ambulatory Visit: Payer: Self-pay

## 2018-10-27 ENCOUNTER — Non-Acute Institutional Stay (HOSPITAL_COMMUNITY)
Admission: EM | Admit: 2018-10-27 | Discharge: 2018-10-27 | Disposition: A | Discharge status: Home / Self Care | Payer: Medicare Other | Attending: Emergency Medicine | Admitting: Emergency Medicine

## 2018-10-27 ENCOUNTER — Encounter (HOSPITAL_COMMUNITY): Payer: Self-pay | Admitting: Emergency Medicine

## 2018-10-27 DIAGNOSIS — F419 Anxiety disorder, unspecified: Secondary | ICD-10-CM | POA: Insufficient documentation

## 2018-10-27 DIAGNOSIS — N186 End stage renal disease: Secondary | ICD-10-CM | POA: Insufficient documentation

## 2018-10-27 DIAGNOSIS — Z87891 Personal history of nicotine dependence: Secondary | ICD-10-CM | POA: Diagnosis not present

## 2018-10-27 DIAGNOSIS — E875 Hyperkalemia: Secondary | ICD-10-CM | POA: Diagnosis not present

## 2018-10-27 DIAGNOSIS — R079 Chest pain, unspecified: Secondary | ICD-10-CM | POA: Diagnosis not present

## 2018-10-27 DIAGNOSIS — I120 Hypertensive chronic kidney disease with stage 5 chronic kidney disease or end stage renal disease: Secondary | ICD-10-CM | POA: Diagnosis not present

## 2018-10-27 DIAGNOSIS — R9431 Abnormal electrocardiogram [ECG] [EKG]: Secondary | ICD-10-CM | POA: Insufficient documentation

## 2018-10-27 DIAGNOSIS — Z992 Dependence on renal dialysis: Secondary | ICD-10-CM | POA: Insufficient documentation

## 2018-10-27 DIAGNOSIS — Z79899 Other long term (current) drug therapy: Secondary | ICD-10-CM | POA: Insufficient documentation

## 2018-10-27 DIAGNOSIS — B1920 Unspecified viral hepatitis C without hepatic coma: Secondary | ICD-10-CM | POA: Diagnosis not present

## 2018-10-27 DIAGNOSIS — Z8673 Personal history of transient ischemic attack (TIA), and cerebral infarction without residual deficits: Secondary | ICD-10-CM | POA: Insufficient documentation

## 2018-10-27 DIAGNOSIS — F329 Major depressive disorder, single episode, unspecified: Secondary | ICD-10-CM | POA: Insufficient documentation

## 2018-10-27 DIAGNOSIS — M47812 Spondylosis without myelopathy or radiculopathy, cervical region: Secondary | ICD-10-CM | POA: Diagnosis not present

## 2018-10-27 DIAGNOSIS — G253 Myoclonus: Secondary | ICD-10-CM | POA: Insufficient documentation

## 2018-10-27 DIAGNOSIS — W19XXXA Unspecified fall, initial encounter: Secondary | ICD-10-CM | POA: Diagnosis not present

## 2018-10-27 DIAGNOSIS — R0602 Shortness of breath: Secondary | ICD-10-CM | POA: Diagnosis not present

## 2018-10-27 DIAGNOSIS — D631 Anemia in chronic kidney disease: Secondary | ICD-10-CM | POA: Diagnosis not present

## 2018-10-27 DIAGNOSIS — I10 Essential (primary) hypertension: Secondary | ICD-10-CM | POA: Diagnosis not present

## 2018-10-27 DIAGNOSIS — E876 Hypokalemia: Secondary | ICD-10-CM | POA: Diagnosis not present

## 2018-10-27 LAB — COMPREHENSIVE METABOLIC PANEL
ALT: 24 U/L (ref 0–44)
AST: 29 U/L (ref 15–41)
Albumin: 3.6 g/dL (ref 3.5–5.0)
Alkaline Phosphatase: 65 U/L (ref 38–126)
Anion gap: 13 (ref 5–15)
BUN: 73 mg/dL — ABNORMAL HIGH (ref 6–20)
CHLORIDE: 104 mmol/L (ref 98–111)
CO2: 25 mmol/L (ref 22–32)
Calcium: 9.5 mg/dL (ref 8.9–10.3)
Creatinine, Ser: 14.95 mg/dL — ABNORMAL HIGH (ref 0.61–1.24)
GFR calc Af Amer: 4 mL/min — ABNORMAL LOW (ref >60)
GFR calc non Af Amer: 3 mL/min — ABNORMAL LOW (ref >60)
Glucose, Bld: 82 mg/dL (ref 70–99)
Sodium: 142 mmol/L (ref 135–145)
Total Bilirubin: 0.6 mg/dL (ref 0.3–1.2)
Total Protein: 6.8 g/dL (ref 6.5–8.1)

## 2018-10-27 LAB — CBC WITH DIFFERENTIAL/PLATELET
Abs Immature Granulocytes: 0 10*3/uL (ref 0.00–0.07)
Basophils Absolute: 0 10*3/uL (ref 0.0–0.1)
Basophils Relative: 0 %
Eosinophils Absolute: 0.2 10*3/uL (ref 0.0–0.5)
Eosinophils Relative: 3 %
HCT: 25.3 % — ABNORMAL LOW (ref 39.0–52.0)
Hemoglobin: 8 g/dL — ABNORMAL LOW (ref 13.0–17.0)
Lymphocytes Relative: 22 %
Lymphs Abs: 1.2 10*3/uL (ref 0.7–4.0)
MCH: 36.4 pg — ABNORMAL HIGH (ref 26.0–34.0)
MCHC: 31.6 g/dL (ref 30.0–36.0)
MCV: 115 fL — ABNORMAL HIGH (ref 80.0–100.0)
Monocytes Absolute: 0.3 10*3/uL (ref 0.1–1.0)
Monocytes Relative: 5 %
NRBC: 0 % (ref 0.0–0.2)
Neutro Abs: 3.7 10*3/uL (ref 1.7–7.7)
Neutrophils Relative %: 70 %
PLATELETS: 174 10*3/uL (ref 150–400)
RBC: 2.2 MIL/uL — ABNORMAL LOW (ref 4.22–5.81)
RDW: 15.9 % — ABNORMAL HIGH (ref 11.5–15.5)
WBC: 5.3 10*3/uL (ref 4.0–10.5)
nRBC: 0 /100 WBC

## 2018-10-27 LAB — SEDIMENTATION RATE: Sed Rate: 35 mm/hr — ABNORMAL HIGH (ref 0–16)

## 2018-10-27 LAB — ETHANOL: Alcohol, Ethyl (B): <10 mg/dL (ref <10)

## 2018-10-27 MED ORDER — SODIUM CHLORIDE 0.9 % IV SOLN: 100.0000 mL | INTRAVENOUS | Status: DC | PRN

## 2018-10-27 MED ORDER — ALTEPLASE 2 MG IJ SOLR: 2.0000 mg | Freq: Once | INTRAMUSCULAR | Status: DC | PRN

## 2018-10-27 MED ORDER — CLONIDINE HCL 0.1 MG PO TABS
0.1000 mg | ORAL_TABLET | Freq: Once | ORAL | Status: AC
  Administered 2018-10-27: 0.1 mg via ORAL
  Filled 2018-10-27: qty 1

## 2018-10-27 MED ORDER — CHLORHEXIDINE GLUCONATE CLOTH 2 % EX PADS: 6.0000 | MEDICATED_PAD | Freq: Every day | CUTANEOUS | Status: DC

## 2018-10-27 MED ORDER — PENTAFLUOROPROP-TETRAFLUOROETH EX AERO: 1.0000 "application " | INHALATION_SPRAY | CUTANEOUS | Status: DC | PRN

## 2018-10-27 MED ORDER — LIDOCAINE-PRILOCAINE 2.5-2.5 % EX CREA: 1.0000 "application " | TOPICAL_CREAM | CUTANEOUS | Status: DC | PRN

## 2018-10-27 MED ORDER — AMLODIPINE BESYLATE 5 MG PO TABS
5.0000 mg | ORAL_TABLET | Freq: Once | ORAL | Status: AC
  Administered 2018-10-27: 5 mg via ORAL
  Filled 2018-10-27: qty 1

## 2018-10-27 MED ORDER — HEPARIN SODIUM (PORCINE) 1000 UNIT/ML DIALYSIS: 1000.0000 [IU] | INTRAMUSCULAR | Status: DC | PRN

## 2018-10-27 MED ORDER — LIDOCAINE HCL (PF) 1 % IJ SOLN: 5.0000 mL | INTRAMUSCULAR | Status: DC | PRN

## 2018-10-27 NOTE — Progress Notes (Signed)
Received pt from [facility], [place] HD tx initiated via 15G x 2 w/o problem, per report Pull/push/flush well w/o problem, per report VSS w/ bp falling significantly from bp in report from ED but ED nurse admin bp meds prior to pt coming to HD, orders on pre HD tx protocol on the work list item #1 states Hold all antihypertensives, per report Will continue to monitor while on HD tx

## 2018-10-27 NOTE — ED Triage Notes (Addendum)
Pt reports multiple falls. 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 Pt reports he needs to be seen by a neuro surgeon but has not seen one yet. Pt reports his falls are because of the problems to his neck. Pt has hx of ESRD. Pt reports having tremors. Pt has high blood pressure, reports he has not taken his BP medicine today.

## 2018-10-27 NOTE — Procedures (Signed)
I was present at this session.  I have reviewed the session itself and made appropriate changes.  HD via [facility] avf.  bp ^, lower vol.  ^ K , low k x 1 h. Then 2.0  Mauricia Area 12/26/20194:02 PM

## 2018-10-27 NOTE — Progress Notes (Signed)
HD tx ended 7 min early @ 1956 d/t clotting in venous chamber causing constant alarms, afraid would clot off and I wouldn't be able to rinse back but w/o  Any other problem UF goal not met Blood rinsed back VSS Pt d/c home from HD unit pr NP order via w/c w/ transport
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
## 2018-10-29 DIAGNOSIS — N186 End stage renal disease: Secondary | ICD-10-CM | POA: Diagnosis not present

## 2018-10-29 DIAGNOSIS — N2581 Secondary hyperparathyroidism of renal origin: Secondary | ICD-10-CM | POA: Diagnosis not present

## 2018-10-29 DIAGNOSIS — E875 Hyperkalemia: Secondary | ICD-10-CM | POA: Diagnosis not present

## 2018-10-29 DIAGNOSIS — D631 Anemia in chronic kidney disease: Secondary | ICD-10-CM | POA: Diagnosis not present

## 2018-10-31 DIAGNOSIS — N186 End stage renal disease: Secondary | ICD-10-CM | POA: Diagnosis not present

## 2018-10-31 DIAGNOSIS — E875 Hyperkalemia: Secondary | ICD-10-CM | POA: Diagnosis not present

## 2018-10-31 DIAGNOSIS — D631 Anemia in chronic kidney disease: Secondary | ICD-10-CM | POA: Diagnosis not present

## 2018-10-31 DIAGNOSIS — N2581 Secondary hyperparathyroidism of renal origin: Secondary | ICD-10-CM | POA: Diagnosis not present

## 2018-11-01 ENCOUNTER — Ambulatory Visit (INDEPENDENT_AMBULATORY_CARE_PROVIDER_SITE_OTHER): Payer: Medicare Other | Admitting: Family Medicine

## 2018-11-01 ENCOUNTER — Encounter (INDEPENDENT_AMBULATORY_CARE_PROVIDER_SITE_OTHER): Payer: Self-pay | Admitting: Family Medicine

## 2018-11-01 DIAGNOSIS — M542 Cervicalgia: Secondary | ICD-10-CM

## 2018-11-01 DIAGNOSIS — M4722 Other spondylosis with radiculopathy, cervical region: Secondary | ICD-10-CM | POA: Diagnosis not present

## 2018-11-01 MED ORDER — BACLOFEN 10 MG PO TABS: 10.0000 mg | ORAL_TABLET | Freq: Three times a day (TID) | ORAL | 3 refills | Status: DC | PRN
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
## 2018-11-02 ENCOUNTER — Emergency Department (HOSPITAL_COMMUNITY): Payer: Medicare Other

## 2018-11-02 ENCOUNTER — Other Ambulatory Visit: Payer: Self-pay

## 2018-11-02 ENCOUNTER — Inpatient Hospital Stay (HOSPITAL_COMMUNITY)
Admission: EM | Admit: 2018-11-02 | Discharge: 2018-11-07 | DRG: 314 | Disposition: A | Discharge status: Home Health | Payer: Medicare Other | Attending: Internal Medicine | Admitting: Internal Medicine

## 2018-11-02 ENCOUNTER — Encounter (HOSPITAL_COMMUNITY): Payer: Self-pay | Admitting: *Deleted

## 2018-11-02 DIAGNOSIS — R4020 Unspecified coma: Secondary | ICD-10-CM | POA: Diagnosis not present

## 2018-11-02 DIAGNOSIS — R404 Transient alteration of awareness: Secondary | ICD-10-CM | POA: Diagnosis not present

## 2018-11-02 DIAGNOSIS — N186 End stage renal disease: Secondary | ICD-10-CM | POA: Diagnosis present

## 2018-11-02 DIAGNOSIS — I160 Hypertensive urgency: Secondary | ICD-10-CM | POA: Diagnosis present

## 2018-11-02 DIAGNOSIS — I161 Hypertensive emergency: Secondary | ICD-10-CM | POA: Diagnosis not present

## 2018-11-02 DIAGNOSIS — Z6820 Body mass index (BMI) 20.0-20.9, adult: Secondary | ICD-10-CM | POA: Diagnosis not present

## 2018-11-02 DIAGNOSIS — Z9115 Patient's noncompliance with renal dialysis: Secondary | ICD-10-CM | POA: Diagnosis not present

## 2018-11-02 DIAGNOSIS — E440 Moderate protein-calorie malnutrition: Secondary | ICD-10-CM

## 2018-11-02 DIAGNOSIS — Z9119 Patient's noncompliance with other medical treatment and regimen: Secondary | ICD-10-CM

## 2018-11-02 DIAGNOSIS — R569 Unspecified convulsions: Secondary | ICD-10-CM | POA: Diagnosis not present

## 2018-11-02 DIAGNOSIS — I959 Hypotension, unspecified: Secondary | ICD-10-CM | POA: Diagnosis not present

## 2018-11-02 DIAGNOSIS — J811 Chronic pulmonary edema: Secondary | ICD-10-CM | POA: Diagnosis present

## 2018-11-02 DIAGNOSIS — E8770 Fluid overload, unspecified: Secondary | ICD-10-CM | POA: Diagnosis present

## 2018-11-02 DIAGNOSIS — M5030 Other cervical disc degeneration, unspecified cervical region: Secondary | ICD-10-CM | POA: Diagnosis not present

## 2018-11-02 DIAGNOSIS — G253 Myoclonus: Secondary | ICD-10-CM

## 2018-11-02 DIAGNOSIS — B1920 Unspecified viral hepatitis C without hepatic coma: Secondary | ICD-10-CM | POA: Diagnosis present

## 2018-11-02 DIAGNOSIS — E162 Hypoglycemia, unspecified: Secondary | ICD-10-CM | POA: Diagnosis not present

## 2018-11-02 DIAGNOSIS — G40909 Epilepsy, unspecified, not intractable, without status epilepticus: Secondary | ICD-10-CM | POA: Diagnosis present

## 2018-11-02 DIAGNOSIS — T82590A Other mechanical complication of surgically created arteriovenous fistula, initial encounter: Secondary | ICD-10-CM | POA: Diagnosis not present

## 2018-11-02 DIAGNOSIS — D638 Anemia in other chronic diseases classified elsewhere: Secondary | ICD-10-CM | POA: Diagnosis present

## 2018-11-02 DIAGNOSIS — Z452 Encounter for adjustment and management of vascular access device: Secondary | ICD-10-CM | POA: Diagnosis not present

## 2018-11-02 DIAGNOSIS — E875 Hyperkalemia: Secondary | ICD-10-CM | POA: Diagnosis not present

## 2018-11-02 DIAGNOSIS — Z978 Presence of other specified devices: Secondary | ICD-10-CM

## 2018-11-02 DIAGNOSIS — Z992 Dependence on renal dialysis: Secondary | ICD-10-CM

## 2018-11-02 DIAGNOSIS — G8929 Other chronic pain: Secondary | ICD-10-CM | POA: Diagnosis present

## 2018-11-02 DIAGNOSIS — D631 Anemia in chronic kidney disease: Secondary | ICD-10-CM | POA: Diagnosis not present

## 2018-11-02 DIAGNOSIS — Z87891 Personal history of nicotine dependence: Secondary | ICD-10-CM

## 2018-11-02 DIAGNOSIS — D7589 Other specified diseases of blood and blood-forming organs: Secondary | ICD-10-CM | POA: Diagnosis present

## 2018-11-02 DIAGNOSIS — J9601 Acute respiratory failure with hypoxia: Secondary | ICD-10-CM | POA: Diagnosis present

## 2018-11-02 DIAGNOSIS — Z8673 Personal history of transient ischemic attack (TIA), and cerebral infarction without residual deficits: Secondary | ICD-10-CM | POA: Diagnosis not present

## 2018-11-02 DIAGNOSIS — I129 Hypertensive chronic kidney disease with stage 1 through stage 4 chronic kidney disease, or unspecified chronic kidney disease: Secondary | ICD-10-CM | POA: Diagnosis not present

## 2018-11-02 DIAGNOSIS — Z4682 Encounter for fitting and adjustment of non-vascular catheter: Secondary | ICD-10-CM | POA: Diagnosis not present

## 2018-11-02 DIAGNOSIS — M542 Cervicalgia: Secondary | ICD-10-CM | POA: Diagnosis not present

## 2018-11-02 DIAGNOSIS — J9811 Atelectasis: Secondary | ICD-10-CM | POA: Diagnosis not present

## 2018-11-02 DIAGNOSIS — G9341 Metabolic encephalopathy: Secondary | ICD-10-CM | POA: Diagnosis present

## 2018-11-02 DIAGNOSIS — N250 Renal osteodystrophy: Secondary | ICD-10-CM | POA: Diagnosis present

## 2018-11-02 DIAGNOSIS — I120 Hypertensive chronic kidney disease with stage 5 chronic kidney disease or end stage renal disease: Secondary | ICD-10-CM | POA: Diagnosis present

## 2018-11-02 DIAGNOSIS — R0902 Hypoxemia: Secondary | ICD-10-CM | POA: Diagnosis not present

## 2018-11-02 DIAGNOSIS — Z451 Encounter for adjustment and management of infusion pump: Secondary | ICD-10-CM | POA: Diagnosis not present

## 2018-11-02 LAB — CBG MONITORING, ED
Glucose-Capillary: 62 mg/dL — ABNORMAL LOW (ref 70–99)
Glucose-Capillary: 127 mg/dL — ABNORMAL HIGH (ref 70–99)

## 2018-11-02 LAB — CBC WITH DIFFERENTIAL/PLATELET
Abs Immature Granulocytes: 0.01 10*3/uL (ref 0.00–0.07)
BASOS PCT: 0 %
Basophils Absolute: 0 10*3/uL (ref 0.0–0.1)
Eosinophils Absolute: 0.2 10*3/uL (ref 0.0–0.5)
Eosinophils Relative: 4 %
HCT: 26.4 % — ABNORMAL LOW (ref 39.0–52.0)
Hemoglobin: 8.2 g/dL — ABNORMAL LOW (ref 13.0–17.0)
Immature Granulocytes: 0 %
Lymphocytes Relative: 18 %
Lymphs Abs: 0.9 10*3/uL (ref 0.7–4.0)
MCH: 35.3 pg — ABNORMAL HIGH (ref 26.0–34.0)
MCHC: 31.1 g/dL (ref 30.0–36.0)
MCV: 113.8 fL — ABNORMAL HIGH (ref 80.0–100.0)
Monocytes Absolute: 0.6 10*3/uL (ref 0.1–1.0)
Monocytes Relative: 12 %
NRBC: 0 % (ref 0.0–0.2)
Neutro Abs: 3.3 10*3/uL (ref 1.7–7.7)
Neutrophils Relative %: 66 %
Platelets: 112 10*3/uL — ABNORMAL LOW (ref 150–400)
RBC: 2.32 MIL/uL — ABNORMAL LOW (ref 4.22–5.81)
RDW: 16.6 % — AB (ref 11.5–15.5)
WBC: 5 10*3/uL (ref 4.0–10.5)

## 2018-11-02 LAB — COMPREHENSIVE METABOLIC PANEL
ALT: 10 U/L (ref 0–44)
AST: 23 U/L (ref 15–41)
Albumin: 3.6 g/dL (ref 3.5–5.0)
Alkaline Phosphatase: 57 U/L (ref 38–126)
Anion gap: 16 — ABNORMAL HIGH (ref 5–15)
BUN: 93 mg/dL — ABNORMAL HIGH (ref 6–20)
CHLORIDE: 106 mmol/L (ref 98–111)
CO2: 22 mmol/L (ref 22–32)
Calcium: 9.1 mg/dL (ref 8.9–10.3)
Creatinine, Ser: 16.87 mg/dL — ABNORMAL HIGH (ref 0.61–1.24)
GFR calc Af Amer: 3 mL/min — ABNORMAL LOW (ref >60)
GFR calc non Af Amer: 3 mL/min — ABNORMAL LOW (ref >60)
Glucose, Bld: 85 mg/dL (ref 70–99)
POTASSIUM: 7 mmol/L — AB (ref 3.5–5.1)
Sodium: 144 mmol/L (ref 135–145)
Total Bilirubin: 0.3 mg/dL (ref 0.3–1.2)
Total Protein: 7 g/dL (ref 6.5–8.1)

## 2018-11-02 LAB — I-STAT CHEM 8, ED
BUN: 97 mg/dL — ABNORMAL HIGH (ref 6–20)
Calcium, Ion: 1.09 mmol/L — ABNORMAL LOW (ref 1.15–1.40)
Chloride: 112 mmol/L — ABNORMAL HIGH (ref 98–111)
Creatinine, Ser: >18 mg/dL — ABNORMAL HIGH (ref 0.61–1.24)
Glucose, Bld: 78 mg/dL (ref 70–99)
HEMATOCRIT: 25 % — AB (ref 39.0–52.0)
Hemoglobin: 8.5 g/dL — ABNORMAL LOW (ref 13.0–17.0)
POTASSIUM: 6.9 mmol/L — AB (ref 3.5–5.1)
Sodium: 140 mmol/L (ref 135–145)
TCO2: 22 mmol/L (ref 22–32)

## 2018-11-02 LAB — I-STAT TROPONIN, ED: Troponin i, poc: 0.03 ng/mL (ref 0.00–0.08)

## 2018-11-02 MED ORDER — DEXTROSE 50 % IV SOLN
INTRAVENOUS | Status: AC
  Filled 2018-11-02: qty 50

## 2018-11-02 MED ORDER — ACETAMINOPHEN 325 MG PO TABS: 650.0000 mg | ORAL_TABLET | Freq: Four times a day (QID) | ORAL | Status: DC | PRN

## 2018-11-02 MED ORDER — CALCIUM GLUCONATE-NACL 1-0.675 GM/50ML-% IV SOLN
1.0000 g | Freq: Once | INTRAVENOUS | Status: AC
  Administered 2018-11-02: 1000 mg via INTRAVENOUS
  Filled 2018-11-02: qty 50

## 2018-11-02 MED ORDER — HEPARIN SODIUM (PORCINE) 5000 UNIT/ML IJ SOLN
5000.0000 [IU] | Freq: Three times a day (TID) | INTRAMUSCULAR | Status: DC
  Administered 2018-11-02 – 2018-11-06 (×10): 5000 [IU] via SUBCUTANEOUS
  Filled 2018-11-02 (×11): qty 1

## 2018-11-02 MED ORDER — CHLORHEXIDINE GLUCONATE CLOTH 2 % EX PADS
6.0000 | MEDICATED_PAD | Freq: Every day | CUTANEOUS | Status: DC
  Administered 2018-11-03 – 2018-11-07 (×5): 6 via TOPICAL

## 2018-11-02 MED ORDER — ALBUTEROL SULFATE (2.5 MG/3ML) 0.083% IN NEBU: 2.5000 mg | INHALATION_SOLUTION | RESPIRATORY_TRACT | Status: DC | PRN

## 2018-11-02 MED ORDER — SODIUM CHLORIDE 0.9 % IV SOLN
1.0000 g | Freq: Once | INTRAVENOUS | Status: DC
  Filled 2018-11-02 (×2): qty 10

## 2018-11-02 MED ORDER — SODIUM CHLORIDE 0.9% FLUSH
3.0000 mL | Freq: Two times a day (BID) | INTRAVENOUS | Status: DC
  Administered 2018-11-02 – 2018-11-05 (×7): 3 mL via INTRAVENOUS

## 2018-11-02 MED ORDER — HYDRALAZINE HCL 20 MG/ML IJ SOLN
10.0000 mg | INTRAMUSCULAR | Status: DC | PRN
  Administered 2018-11-03: 10 mg via INTRAVENOUS
  Filled 2018-11-02: qty 1

## 2018-11-02 MED ORDER — ACETAMINOPHEN 650 MG RE SUPP: 650.0000 mg | Freq: Four times a day (QID) | RECTAL | Status: DC | PRN

## 2018-11-02 MED ORDER — DEXTROSE 50 % IV SOLN
50.0000 mL | Freq: Once | INTRAVENOUS | Status: AC
  Administered 2018-11-02: 50 mL via INTRAVENOUS

## 2018-11-02 MED ORDER — SODIUM BICARBONATE 8.4 % IV SOLN
50.0000 meq | Freq: Once | INTRAVENOUS | Status: AC
  Administered 2018-11-02: 50 meq via INTRAVENOUS
  Filled 2018-11-02: qty 50

## 2018-11-02 NOTE — ED Notes (Signed)
Dr.Yao called about Chem 8 results

## 2018-11-02 NOTE — ED Notes (Addendum)
Son at bedside, updated on plan of care. Phone number in chart to be contacted if needed Evangeline Gula). Pt responsive to name only  Per son, pt believed to have had dialysis 2 days ago due to holiday scheduling. Was due for dialysis today but did not go
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
## 2018-11-02 NOTE — ED Triage Notes (Signed)
Pt from home for continuous seizures per family all day. Pt was seen by PCP yesterday for neck pain, prescribed baclofen. EMS gave 5mg  IM versed enroute. Per EMS, seizure appeared to be R sided. Hx of seizure. Per chart, pt appears to be a Tuesday, Thursday, Saturday dialysis pt, unknown last treatment. EMS placed 20g IV R wrist. Bilateral fistulas noted. Dr.Yao placed 20g L EJ. CBG noted 62, dextrose given enroute

## 2018-11-02 NOTE — ED Notes (Signed)
Pt had 1 loose BM episode of incontinence upon arrival; Pt was cleaned and condom catheter was applied.  Portable XR at bedside at this time.

## 2018-11-03 ENCOUNTER — Inpatient Hospital Stay (HOSPITAL_COMMUNITY): Payer: Medicare Other

## 2018-11-03 DIAGNOSIS — G9341 Metabolic encephalopathy: Secondary | ICD-10-CM

## 2018-11-03 DIAGNOSIS — I160 Hypertensive urgency: Secondary | ICD-10-CM | POA: Diagnosis present

## 2018-11-03 DIAGNOSIS — D638 Anemia in other chronic diseases classified elsewhere: Secondary | ICD-10-CM | POA: Diagnosis present

## 2018-11-03 DIAGNOSIS — E162 Hypoglycemia, unspecified: Secondary | ICD-10-CM | POA: Diagnosis present

## 2018-11-03 LAB — POCT I-STAT, CHEM 8
BUN: 87 mg/dL — ABNORMAL HIGH (ref 6–20)
BUN: 85 mg/dL — ABNORMAL HIGH (ref 6–20)
Calcium, Ion: 1.1 mmol/L — ABNORMAL LOW (ref 1.15–1.40)
Calcium, Ion: 1.32 mmol/L (ref 1.15–1.40)
Chloride: 110 mmol/L (ref 98–111)
Chloride: 111 mmol/L (ref 98–111)
Creatinine, Ser: >18 mg/dL — ABNORMAL HIGH (ref 0.61–1.24)
Creatinine, Ser: >18 mg/dL — ABNORMAL HIGH (ref 0.61–1.24)
Glucose, Bld: 91 mg/dL (ref 70–99)
Glucose, Bld: 110 mg/dL — ABNORMAL HIGH (ref 70–99)
HCT: 28 % — ABNORMAL LOW (ref 39.0–52.0)
HCT: 26 % — ABNORMAL LOW (ref 39.0–52.0)
Hemoglobin: 9.5 g/dL — ABNORMAL LOW (ref 13.0–17.0)
Hemoglobin: 8.8 g/dL — ABNORMAL LOW (ref 13.0–17.0)
POTASSIUM: 5.9 mmol/L — AB (ref 3.5–5.1)
Potassium: 6.1 mmol/L — ABNORMAL HIGH (ref 3.5–5.1)
SODIUM: 142 mmol/L (ref 135–145)
Sodium: 142 mmol/L (ref 135–145)
TCO2: 24 mmol/L (ref 22–32)
TCO2: 26 mmol/L (ref 22–32)

## 2018-11-03 LAB — BASIC METABOLIC PANEL
Anion gap: 18 — ABNORMAL HIGH (ref 5–15)
BUN: 86 mg/dL — ABNORMAL HIGH (ref 6–20)
CO2: 20 mmol/L — ABNORMAL LOW (ref 22–32)
Calcium: 9.2 mg/dL (ref 8.9–10.3)
Chloride: 106 mmol/L (ref 98–111)
Creatinine, Ser: 16.75 mg/dL — ABNORMAL HIGH (ref 0.61–1.24)
GFR calc Af Amer: 3 mL/min — ABNORMAL LOW (ref >60)
GFR, EST NON AFRICAN AMERICAN: 3 mL/min — AB (ref >60)
Glucose, Bld: 95 mg/dL (ref 70–99)
Potassium: 6.2 mmol/L — ABNORMAL HIGH (ref 3.5–5.1)
Sodium: 144 mmol/L (ref 135–145)

## 2018-11-03 LAB — BLOOD GAS, ARTERIAL
Acid-base deficit: 1.7 mmol/L (ref 0.0–2.0)
Bicarbonate: 22.1 mmol/L (ref 20.0–28.0)
Drawn by: 331761
FIO2: 100
MECHVT: 490 mL
O2 Saturation: 99.5 %
PEEP: 5 cmH2O
Patient temperature: 98.6
RATE: 18 resp/min
pCO2 arterial: 34.5 mmHg (ref 32.0–48.0)
pH, Arterial: 7.422 (ref 7.350–7.450)
pO2, Arterial: 449 mmHg — ABNORMAL HIGH (ref 83.0–108.0)

## 2018-11-03 LAB — POTASSIUM
Potassium: 7 mmol/L (ref 3.5–5.1)
Potassium: 4.4 mmol/L (ref 3.5–5.1)

## 2018-11-03 LAB — CBG MONITORING, ED
Glucose-Capillary: 58 mg/dL — ABNORMAL LOW (ref 70–99)
Glucose-Capillary: 73 mg/dL (ref 70–99)
Glucose-Capillary: 81 mg/dL (ref 70–99)
Glucose-Capillary: 89 mg/dL (ref 70–99)

## 2018-11-03 LAB — RENAL FUNCTION PANEL
Albumin: 3.4 g/dL — ABNORMAL LOW (ref 3.5–5.0)
Anion gap: 15 (ref 5–15)
BUN: 92 mg/dL — ABNORMAL HIGH (ref 6–20)
CHLORIDE: 107 mmol/L (ref 98–111)
CO2: 23 mmol/L (ref 22–32)
CREATININE: 17.86 mg/dL — AB (ref 0.61–1.24)
Calcium: 9.3 mg/dL (ref 8.9–10.3)
GFR calc Af Amer: 3 mL/min — ABNORMAL LOW (ref >60)
GFR calc non Af Amer: 2 mL/min — ABNORMAL LOW (ref >60)
Glucose, Bld: 85 mg/dL (ref 70–99)
Phosphorus: 5.4 mg/dL — ABNORMAL HIGH (ref 2.5–4.6)
Potassium: 6.1 mmol/L — ABNORMAL HIGH (ref 3.5–5.1)
Sodium: 145 mmol/L (ref 135–145)

## 2018-11-03 LAB — GLUCOSE, CAPILLARY
Glucose-Capillary: 75 mg/dL (ref 70–99)
Glucose-Capillary: 76 mg/dL (ref 70–99)
Glucose-Capillary: 89 mg/dL (ref 70–99)
Glucose-Capillary: 76 mg/dL (ref 70–99)
Glucose-Capillary: 81 mg/dL (ref 70–99)

## 2018-11-03 LAB — CBC
HCT: 25.4 % — ABNORMAL LOW (ref 39.0–52.0)
Hemoglobin: 8.1 g/dL — ABNORMAL LOW (ref 13.0–17.0)
MCH: 36.2 pg — ABNORMAL HIGH (ref 26.0–34.0)
MCHC: 31.9 g/dL (ref 30.0–36.0)
MCV: 113.4 fL — ABNORMAL HIGH (ref 80.0–100.0)
Platelets: 108 10*3/uL — ABNORMAL LOW (ref 150–400)
RBC: 2.24 MIL/uL — ABNORMAL LOW (ref 4.22–5.81)
RDW: 16.5 % — ABNORMAL HIGH (ref 11.5–15.5)
WBC: 5.4 10*3/uL (ref 4.0–10.5)
nRBC: 0 % (ref 0.0–0.2)

## 2018-11-03 LAB — MRSA PCR SCREENING: MRSA by PCR: NEGATIVE

## 2018-11-03 MED ORDER — LIDOCAINE-PRILOCAINE 2.5-2.5 % EX CREA: 1.0000 "application " | TOPICAL_CREAM | CUTANEOUS | Status: DC | PRN

## 2018-11-03 MED ORDER — MIDAZOLAM HCL 2 MG/2ML IJ SOLN
2.0000 mg | Freq: Once | INTRAMUSCULAR | Status: AC
  Administered 2018-11-03: 2 mg via INTRAVENOUS
  Filled 2018-11-03: qty 2

## 2018-11-03 MED ORDER — VITAMIN B-1 100 MG PO TABS
100.0000 mg | ORAL_TABLET | Freq: Every day | ORAL | Status: DC
  Filled 2018-11-03: qty 1

## 2018-11-03 MED ORDER — VITAL HIGH PROTEIN PO LIQD: 1000.0000 mL | ORAL | Status: DC

## 2018-11-03 MED ORDER — VITAMIN B-12 1000 MCG PO TABS
2000.0000 ug | ORAL_TABLET | Freq: Every day | ORAL | Status: DC
  Administered 2018-11-03 – 2018-11-07 (×5): 2000 ug via ORAL
  Filled 2018-11-03 (×5): qty 2

## 2018-11-03 MED ORDER — SODIUM CHLORIDE 0.9 % IV SOLN: 100.0000 mL | INTRAVENOUS | Status: DC | PRN

## 2018-11-03 MED ORDER — ETOMIDATE 2 MG/ML IV SOLN
20.0000 mg | Freq: Once | INTRAVENOUS | Status: AC
  Administered 2018-11-03: 20 mg via INTRAVENOUS

## 2018-11-03 MED ORDER — THIAMINE HCL 100 MG/ML IJ SOLN
100.0000 mg | Freq: Every day | INTRAMUSCULAR | Status: DC
  Administered 2018-11-03 – 2018-11-04 (×2): 100 mg via INTRAVENOUS
  Filled 2018-11-03 (×2): qty 2

## 2018-11-03 MED ORDER — FENTANYL CITRATE (PF) 100 MCG/2ML IJ SOLN
50.0000 ug | Freq: Once | INTRAMUSCULAR | Status: AC
  Administered 2018-11-03: 50 ug via INTRAVENOUS

## 2018-11-03 MED ORDER — FAMOTIDINE IN NACL 20-0.9 MG/50ML-% IV SOLN
20.0000 mg | Freq: Two times a day (BID) | INTRAVENOUS | Status: DC
  Filled 2018-11-03: qty 50

## 2018-11-03 MED ORDER — ORAL CARE MOUTH RINSE
15.0000 mL | Freq: Two times a day (BID) | OROMUCOSAL | Status: DC
  Administered 2018-11-03: 15 mL via OROMUCOSAL

## 2018-11-03 MED ORDER — ORAL CARE MOUTH RINSE
15.0000 mL | OROMUCOSAL | Status: DC
  Administered 2018-11-03 – 2018-11-05 (×17): 15 mL via OROMUCOSAL

## 2018-11-03 MED ORDER — DEXTROSE 50 % IV SOLN
25.0000 g | Freq: Once | INTRAVENOUS | Status: AC
  Administered 2018-11-03: 25 g via INTRAVENOUS

## 2018-11-03 MED ORDER — RENA-VITE PO TABS
1.0000 | ORAL_TABLET | Freq: Every day | ORAL | Status: DC
  Administered 2018-11-03 – 2018-11-06 (×3): 1 via ORAL
  Filled 2018-11-03 (×5): qty 1

## 2018-11-03 MED ORDER — MIDAZOLAM HCL 2 MG/2ML IJ SOLN
2.0000 mg | INTRAMUSCULAR | Status: DC | PRN
  Administered 2018-11-03: 2 mg via INTRAVENOUS
  Filled 2018-11-03 (×2): qty 2

## 2018-11-03 MED ORDER — PHENYLEPHRINE HCL-NACL 10-0.9 MG/250ML-% IV SOLN
0.0000 ug/min | INTRAVENOUS | Status: DC
  Administered 2018-11-04: 20 ug/min via INTRAVENOUS
  Filled 2018-11-03: qty 250

## 2018-11-03 MED ORDER — CLONIDINE HCL 0.1 MG PO TABS
0.1000 mg | ORAL_TABLET | Freq: Two times a day (BID) | ORAL | Status: DC
  Administered 2018-11-03: 0.1 mg via ORAL
  Filled 2018-11-03 (×2): qty 1

## 2018-11-03 MED ORDER — MIDAZOLAM HCL 2 MG/2ML IJ SOLN: 2.0000 mg | INTRAMUSCULAR | Status: DC | PRN

## 2018-11-03 MED ORDER — DEXMEDETOMIDINE HCL IN NACL 400 MCG/100ML IV SOLN
0.4000 ug/kg/h | INTRAVENOUS | Status: DC
  Administered 2018-11-03 – 2018-11-04 (×2): 0.4 ug/kg/h via INTRAVENOUS
  Filled 2018-11-03 (×2): qty 100

## 2018-11-03 MED ORDER — FAMOTIDINE 40 MG/5ML PO SUSR
20.0000 mg | Freq: Every day | ORAL | Status: DC
  Administered 2018-11-03 – 2018-11-04 (×2): 20 mg
  Filled 2018-11-03 (×3): qty 2.5

## 2018-11-03 MED ORDER — FENTANYL CITRATE (PF) 100 MCG/2ML IJ SOLN
INTRAMUSCULAR | Status: AC
  Filled 2018-11-03: qty 2

## 2018-11-03 MED ORDER — FENTANYL CITRATE (PF) 100 MCG/2ML IJ SOLN
100.0000 ug | INTRAMUSCULAR | Status: DC | PRN
  Administered 2018-11-03: 100 ug via INTRAVENOUS

## 2018-11-03 MED ORDER — SEVELAMER CARBONATE 800 MG PO TABS
3200.0000 mg | ORAL_TABLET | Freq: Three times a day (TID) | ORAL | Status: DC
  Administered 2018-11-05 – 2018-11-07 (×6): 3200 mg via ORAL
  Filled 2018-11-03 (×15): qty 4

## 2018-11-03 MED ORDER — FAMOTIDINE IN NACL 20-0.9 MG/50ML-% IV SOLN: 20.0000 mg | INTRAVENOUS | Status: DC

## 2018-11-03 MED ORDER — FENTANYL CITRATE (PF) 100 MCG/2ML IJ SOLN
100.0000 ug | INTRAMUSCULAR | Status: DC | PRN
  Administered 2018-11-03: 100 ug via INTRAVENOUS
  Filled 2018-11-03 (×2): qty 2

## 2018-11-03 MED ORDER — CHLORHEXIDINE GLUCONATE 0.12% ORAL RINSE (MEDLINE KIT)
15.0000 mL | Freq: Two times a day (BID) | OROMUCOSAL | Status: DC
  Administered 2018-11-03 – 2018-11-05 (×5): 15 mL via OROMUCOSAL

## 2018-11-03 MED ORDER — LORAZEPAM 2 MG/ML IJ SOLN
1.0000 mg | Freq: Once | INTRAMUSCULAR | Status: AC
  Administered 2018-11-03: 1 mg via INTRAVENOUS
  Filled 2018-11-03: qty 1

## 2018-11-03 MED ORDER — MIDAZOLAM HCL 2 MG/2ML IJ SOLN
INTRAMUSCULAR | Status: AC
  Administered 2018-11-03: 10:00:00
  Filled 2018-11-03: qty 2

## 2018-11-03 MED ORDER — CALCIUM GLUCONATE 10 % IV SOLN
1.0000 g | Freq: Once | INTRAVENOUS | Status: AC
  Administered 2018-11-03: 1 g via INTRAVENOUS
  Filled 2018-11-03: qty 10

## 2018-11-03 MED ORDER — DEXTROSE 50 % IV SOLN
INTRAVENOUS | Status: AC
  Filled 2018-11-03: qty 50

## 2018-11-03 MED ORDER — PENTAFLUOROPROP-TETRAFLUOROETH EX AERO: 1.0000 "application " | INHALATION_SPRAY | CUTANEOUS | Status: DC | PRN

## 2018-11-03 MED ORDER — HEPARIN SODIUM (PORCINE) 1000 UNIT/ML IJ SOLN
2800.0000 [IU] | Freq: Once | INTRAMUSCULAR | Status: AC
  Administered 2018-11-03: 2800 [IU] via INTRAVENOUS
  Filled 2018-11-03: qty 3

## 2018-11-03 MED ORDER — AMLODIPINE BESYLATE 10 MG PO TABS: 10.0000 mg | ORAL_TABLET | Freq: Every day | ORAL | Status: DC

## 2018-11-03 MED ORDER — NEPRO/CARBSTEADY PO LIQD
1000.0000 mL | ORAL | Status: DC
  Administered 2018-11-04: 1000 mL
  Filled 2018-11-03 (×2): qty 1000

## 2018-11-03 NOTE — Procedures (Signed)
I was present at this dialysis session. I have reviewed the session itself and made appropriate changes.   Overnight events reviewed with Dr. Royce Macadamia and nursing.  K of 7.0 was peripheral after first attempt at HD.  He has been recannulated and running Qb 250 with high venous pressures.  Since we have recannulated since last K, will repeat serum K peripheral.  Keep on low K bath. If K peripheral is still up after 1h of Tx will have temp HD cath and will temporize with Ca, Insulin/Dextrose.   There were no vitals filed for this visit.  Recent Labs  Lab 11/03/18 0252 11/03/18 0639  NA 145  --   K 6.1* 7.0*  CL 107  --   CO2 23  --   GLUCOSE 85  --   BUN 92*  --   CREATININE 17.86*  --   CALCIUM 9.3  --   PHOS 5.4*  --     Recent Labs  Lab 10/27/18 1337 11/02/18 2121 11/02/18 2125 11/03/18 0252  WBC 5.3 5.0  --  5.4  NEUTROABS 3.7 3.3  --   --   HGB 8.0* 8.2* 8.5* 8.1*  HCT 25.3* 26.4* 25.0* 25.4*  MCV 115.0* 113.8*  --  113.4*  PLT 174 112*  --  108*    Scheduled Meds: . amLODipine  10 mg Oral QHS  . Chlorhexidine Gluconate Cloth  6 each Topical Q0600  . cloNIDine  0.1 mg Oral BID  . heparin  5,000 Units Subcutaneous Q8H  . mouth rinse  15 mL Mouth Rinse BID  . multivitamin  1 tablet Oral QHS  . sevelamer carbonate  3,200 mg Oral TID WC  . sodium chloride flush  3 mL Intravenous Q12H  . thiamine  100 mg Oral Daily  . cyanocobalamin  2,000 mcg Oral Daily   Continuous Infusions: . sodium chloride    . sodium chloride     PRN Meds:.sodium chloride, sodium chloride, acetaminophen **OR** acetaminophen, albuterol, hydrALAZINE, lidocaine-prilocaine, pentafluoroprop-tetrafluoroeth   Pearson Grippe  MD 11/03/2018, 8:17 AM

## 2018-11-03 NOTE — ED Notes (Signed)
Pt noted to be drooling and coughing on secretions; lethargic at present. Dextrose given per standing orders for CBG 58. Dr.Smith paged

## 2018-11-03 NOTE — ED Notes (Signed)
Pt continued to have increased drooling, able to wake up when name is called and is coughing to clear airway. Suction set up at bedside with frequent suctioning

## 2018-11-03 NOTE — ED Notes (Signed)
Requested rapid response assess patient prior to pt being transported to dialysis when ready. Both this RN and RR felt it be best and safe that pt receive bedside dialysis due to need for increased suctioning
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
## 2018-11-03 NOTE — Procedures (Signed)
0315 - received report from B Oldland, RN in ER.  She is concerned that patient may require higher level of care due to constant oral secretions, requiring suctioning.  Patient also with continuous jerking. 0325 - Notified Dr. Royce Macadamia of above, she spoke with ER MD.  Dr. Royce Macadamia asked that patient be done on unit. 7681 - Call received from B Oldland, RN.  Patient has ICU bed , unit 71M. 0345 - patient was assigned to room 2011, however, no water hookup available at sink in that room.  System moved to Room 71M10 0430 - difficulty cannulating AVF due to patient constant movements, unable to follow commands.  Patient cannulated 1 up and 1 down.  Both needles flushed and aspirated easitly. 30 - trmt initiated.  Difficulty with venous pressure.  Venous pressure at 250-300 with BFR 300.  Art pressure positive at 20-40.  Difficulty keeping system running.   0530 - high venous pressure, blood returned, clot in venous chamber noted. 1572 - trmt reinitiated with new set up. 6203 - Dr. Royce Macadamia at bedside.  Updated her regarding high venous pressures, and possibility of access recirculation.  Continue trmt per her direction. 0615 - Report given to Levander Campion, RN

## 2018-11-03 NOTE — Procedures (Signed)
Hemodialysis Catheter Insertion Procedure Note Maurice Bell [id number] 1956-10-19  Procedure: Insertion of Hemodialysis Catheter Indications: Hemodialysis  Procedure Details Consent: Risks of procedure as well as the alternatives and risks of each were explained to the (patient/caregiver).  Consent for procedure obtained.  Time Out: Verified patient identification, verified procedure, site/side was marked, verified correct patient position, special equipment/implants available, medications/allergies/relevent history reviewed, required imaging and test results available.  Performed  Maximum sterile technique was used including antiseptics, cap, gloves, gown, hand hygiene, mask and sheet.  Skin prep: Chlorhexidine; local anesthetic administered  A Trialysis HD catheter was placed in the right femoral vein due to patient being a dialysis patient and emergent situation using the Seldinger technique.  Evaluation Blood flow good Complications: No apparent complications Patient did tolerate procedure well. Chest X-ray ordered to verify placement.  CXR: pending.   Procedure performed under direct supervision of Dr. [place] Bells and with ultrasound guidance for real time vessel cannulation.     [name], NP-C [facility] Pgr: [phone] or [phone] 11/03/2018, 11:08 AM
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
## 2018-11-04 DIAGNOSIS — N186 End stage renal disease: Secondary | ICD-10-CM

## 2018-11-04 DIAGNOSIS — Z978 Presence of other specified devices: Secondary | ICD-10-CM

## 2018-11-04 DIAGNOSIS — E440 Moderate protein-calorie malnutrition: Secondary | ICD-10-CM

## 2018-11-04 DIAGNOSIS — D638 Anemia in other chronic diseases classified elsewhere: Secondary | ICD-10-CM

## 2018-11-04 LAB — COMPREHENSIVE METABOLIC PANEL
ALT: 25 U/L (ref 0–44)
AST: 29 U/L (ref 15–41)
Albumin: 3.2 g/dL — ABNORMAL LOW (ref 3.5–5.0)
Alkaline Phosphatase: 45 U/L (ref 38–126)
Anion gap: 16 — ABNORMAL HIGH (ref 5–15)
BUN: 54 mg/dL — ABNORMAL HIGH (ref 6–20)
CO2: 24 mmol/L (ref 22–32)
Calcium: 9 mg/dL (ref 8.9–10.3)
Chloride: 98 mmol/L (ref 98–111)
Creatinine, Ser: 12.99 mg/dL — ABNORMAL HIGH (ref 0.61–1.24)
GFR calc Af Amer: 4 mL/min — ABNORMAL LOW (ref >60)
GFR calc non Af Amer: 4 mL/min — ABNORMAL LOW (ref >60)
Glucose, Bld: 106 mg/dL — ABNORMAL HIGH (ref 70–99)
Potassium: 5.4 mmol/L — ABNORMAL HIGH (ref 3.5–5.1)
Sodium: 138 mmol/L (ref 135–145)
Total Bilirubin: 0.5 mg/dL (ref 0.3–1.2)
Total Protein: 6.4 g/dL — ABNORMAL LOW (ref 6.5–8.1)

## 2018-11-04 LAB — CBC
HCT: 22.9 % — ABNORMAL LOW (ref 39.0–52.0)
HEMOGLOBIN: 7.5 g/dL — AB (ref 13.0–17.0)
MCH: 36.4 pg — ABNORMAL HIGH (ref 26.0–34.0)
MCHC: 32.8 g/dL (ref 30.0–36.0)
MCV: 111.2 fL — ABNORMAL HIGH (ref 80.0–100.0)
Platelets: 89 10*3/uL — ABNORMAL LOW (ref 150–400)
RBC: 2.06 MIL/uL — ABNORMAL LOW (ref 4.22–5.81)
RDW: 16.3 % — ABNORMAL HIGH (ref 11.5–15.5)
WBC: 4 10*3/uL (ref 4.0–10.5)
nRBC: 0 % (ref 0.0–0.2)

## 2018-11-04 LAB — BASIC METABOLIC PANEL
Anion gap: 15 (ref 5–15)
BUN: 61 mg/dL — ABNORMAL HIGH (ref 6–20)
CO2: 23 mmol/L (ref 22–32)
CREATININE: 13.99 mg/dL — AB (ref 0.61–1.24)
Calcium: 9 mg/dL (ref 8.9–10.3)
Chloride: 99 mmol/L (ref 98–111)
GFR calc Af Amer: 4 mL/min — ABNORMAL LOW (ref >60)
GFR calc non Af Amer: 3 mL/min — ABNORMAL LOW (ref >60)
Glucose, Bld: 100 mg/dL — ABNORMAL HIGH (ref 70–99)
Potassium: 5 mmol/L (ref 3.5–5.1)
Sodium: 137 mmol/L (ref 135–145)

## 2018-11-04 LAB — POCT I-STAT, CHEM 8
BUN: 7 mg/dL (ref 6–20)
Calcium, Ion: 1.27 mmol/L (ref 1.15–1.40)
Chloride: 93 mmol/L — ABNORMAL LOW (ref 98–111)
Creatinine, Ser: 2 mg/dL — ABNORMAL HIGH (ref 0.61–1.24)
Glucose, Bld: 93 mg/dL (ref 70–99)
HCT: 47 % (ref 39.0–52.0)
Hemoglobin: 16 g/dL (ref 13.0–17.0)
Potassium: 2.2 mmol/L — CL (ref 3.5–5.1)
Sodium: 138 mmol/L (ref 135–145)
TCO2: 33 mmol/L — ABNORMAL HIGH (ref 22–32)

## 2018-11-04 LAB — GLUCOSE, CAPILLARY
Glucose-Capillary: 118 mg/dL — ABNORMAL HIGH (ref 70–99)
Glucose-Capillary: 84 mg/dL (ref 70–99)
Glucose-Capillary: 86 mg/dL (ref 70–99)
Glucose-Capillary: 88 mg/dL (ref 70–99)
Glucose-Capillary: 91 mg/dL (ref 70–99)

## 2018-11-04 MED ORDER — SODIUM ZIRCONIUM CYCLOSILICATE 5 G PO PACK
5.0000 g | PACK | Freq: Two times a day (BID) | ORAL | Status: DC
  Filled 2018-11-04 (×2): qty 1

## 2018-11-04 NOTE — Progress Notes (Signed)
Initial Nutrition Assessment  DOCUMENTATION CODES:   Non-severe (moderate) malnutrition in context of chronic illness  INTERVENTION:   Recommend Nepro Shake po BID, each supplement provides 425 kcal and 19 grams protein, once diet advanced  Continue Rena-Vit  NUTRITION DIAGNOSIS:   Moderate Malnutrition related to chronic illness(ESRD on HD) as evidenced by mild fat depletion, mild muscle depletion.  GOAL:   Patient will meet greater than or equal to 90% of their needs   MONITOR:   Diet advancement, PO intake, Supplement acceptance, Labs, Weight trends  REASON FOR ASSESSMENT:   Consult Enteral/tube feeding initiation and management  ASSESSMENT:   63 yo male admitted with acute encephalopathy with seizure like activity/myoclonic jerking requiring intubation, missed HD with hyperkalemia. Noted last HD session on 12/26. PMH includes ESRD on HD, CVA, HTN, renal osteodystrophy, hepatitis C  1/2 HD with 2.8 L net UF 1/3 Extubated  Adult Tube Feeding Protocol ordered this AM; pt did receive Nepro TF for a few hours prior to extubation  Pt extremely groggy post extubation on exam this AM. Pt keeps repeating "uh huh" with every questions asked by this RD. Unable to obtain very good diet or weight history. Pt reports he has been eating well but can't tell writer what he eats or how often he eats. Pt does indicate that he takes Nepro but does not know how often. Pt reports he knows his dry wt but could not tell this RD his dry wt.   Current wt 57.2 kg; weight encounters appear to trend down. However, not sure how reliable this is as pt is a dialysis patient and IDW fluctuations are expected  Labs: potassium 5.4 Meds: Rena-Vit, renvela, lokelma, thiamine, B-12   NUTRITION - FOCUSED PHYSICAL EXAM:    Most Recent Value  Orbital Region  Mild depletion  Upper Arm Region  Mild depletion  Thoracic and Lumbar Region  Mild depletion  Buccal Region  Mild depletion  Temple Region   Mild depletion  Clavicle Bone Region  Mild depletion  Clavicle and Acromion Bone Region  Mild depletion  Scapular Bone Region  Mild depletion  Dorsal Hand  Unable to assess  Patellar Region  No depletion  Anterior Thigh Region  No depletion  Posterior Calf Region  No depletion  Edema (RD Assessment)  None       Diet Order:   Diet Order            Diet NPO time specified  Diet effective now              EDUCATION NEEDS:   Not appropriate for education at this time  Skin:  Skin Assessment: Reviewed RN Assessment  Last BM:  1/2  Height:   Ht Readings from Last 1 Encounters:  10/19/18 5\' 5"  (1.651 m)    Weight:   Wt Readings from Last 1 Encounters:  11/03/18 57.2 kg    Ideal Body Weight:  61.8 kg  BMI:  Body mass index is 20.98 kg/m.  Estimated Nutritional Needs:   Kcal:  1800-2000 kcals   Protein:  90-100 g  Fluid:  1000 mL plus UOP   [employer] MS, RD, LDN, CNSC [phone] Pager  [phone] Weekend/On-Call Pager

## 2018-11-04 NOTE — Procedures (Signed)
Extubation Procedure Note  Patient Details:   Name: Maurice Bell DOB: 16-Jan-1956 MRN: [id number]   Airway Documentation:    Vent end date: 11/04/18 Vent end time: 1112   Evaluation  O2 sats: stable throughout Complications: No apparent complications Patient did tolerate procedure well. Bilateral Breath Sounds: Clear   Yes   Patient was extubated to a 3L [place] without any complications, dyspnea or stridor noted. Positive cuff leak.  [name] L 11/04/2018, 11:12 AM

## 2018-11-04 NOTE — Progress Notes (Signed)
Admit: 11/02/2018 LOS: 2  89M ESRD with AMS, Hyperkalemia, Myoclonus  Subjective:  . HD yesterday completed with R Fem Temp HD cath . K 4.4 post HD to 5.4 this AM . Pt alert and appropriate this AM on vent  . Temp req some PE this AM  01/02 0701 - 01/03 0700 In: 184.1 [I.V.:104.1; NG/GT:80] Out: 5572   Filed Weights   11/03/18 1315 11/03/18 1400  Weight: 60 kg 57.2 kg    Scheduled Meds: . chlorhexidine gluconate (MEDLINE KIT)  15 mL Mouth Rinse BID  . Chlorhexidine Gluconate Cloth  6 each Topical Q0600  . famotidine  20 mg Per Tube Daily  . heparin  5,000 Units Subcutaneous Q8H  . mouth rinse  15 mL Mouth Rinse 10 times per day  . multivitamin  1 tablet Oral QHS  . sevelamer carbonate  3,200 mg Oral TID WC  . sodium chloride flush  3 mL Intravenous Q12H  . thiamine injection  100 mg Intravenous Daily  . cyanocobalamin  2,000 mcg Oral Daily   Continuous Infusions: . sodium chloride    . sodium chloride    . dexmedetomidine (PRECEDEX) IV infusion 0.2 mcg/kg/hr (11/04/18 0900)  . feeding supplement (NEPRO CARB STEADY) 1,000 mL (11/04/18 0327)  . phenylephrine (NEO-SYNEPHRINE) Adult infusion 10 mcg/min (11/04/18 0900)   PRN Meds:.sodium chloride, sodium chloride, acetaminophen **OR** acetaminophen, albuterol, fentaNYL (SUBLIMAZE) injection, fentaNYL (SUBLIMAZE) injection, hydrALAZINE, lidocaine-prilocaine, midazolam, midazolam, pentafluoroprop-tetrafluoroeth  Current Labs: reviewed    Physical Exam:  Blood pressure 135/85, pulse 94, temperature 98.9 F (37.2 C), temperature source Oral, resp. rate 18, weight 57.2 kg, SpO2 100 %. Intubated, awake, alert, nods and tracks RRR nl s1s2 R fem Temp HD cath C/D L FA AVF bandaged, not bloody No LEE Coars eBS b/l  A 1. ESRD TTS L FA AVF [place] 2. AMS, seems improved today post HD 3. Hyperkalemia, resolved K 5.4 this AM 4. R Fem Temp HD Cath present, placed 1/2 by CCM 5. Likely L FA AVF Recirculation 6. Anemia of  ESRD 7. CKD BMD; P 5.4 8. Intubated 1/2 for AMS  P . Much improved . Will see if extubated today and plan HD later today or tomorrow: 2K, Fem HD Cath, 4h, Qb 400, Tight hep, Start 2L UF . Once stable c/s IR for angiogram, eval L FA AVF . Medication Issues; o Preferred narcotic agents for pain control are hydromorphone, fentanyl, and methadone. Morphine should not be used.  o Baclofen should be avoided o Avoid oral sodium phosphate and magnesium citrate based laxatives / bowel preps    Pearson Grippe MD 11/04/2018, 9:44 AM  Recent Labs  Lab 11/03/18 0252  11/03/18 0844 11/03/18 0845 11/03/18 1036 11/03/18 1406 11/04/18 0647  NA 145   < > 144 142 142  --  138  K 6.1*   < > 6.2* 6.1* 5.9* 4.4 5.4*  CL 107   < > 106 110 111  --  98  CO2 23  --  20*  --   --   --  24  GLUCOSE 85   < > 95 91 110*  --  106*  BUN 92*   < > 86* 87* 85*  --  54*  CREATININE 17.86*   < > 16.75* >18.00* >18.00*  --  12.99*  CALCIUM 9.3  --  9.2  --   --   --  9.0  PHOS 5.4*  --   --   --   --   --   --    < > =  values in this interval not displayed.   Recent Labs  Lab 11/02/18 2121  11/03/18 0252  11/03/18 0845 11/03/18 1036 11/04/18 0647  WBC 5.0  --  5.4  --   --   --  4.0  NEUTROABS 3.3  --   --   --   --   --   --   HGB 8.2*   < > 8.1*   < > 9.5* 8.8* 7.5*  HCT 26.4*   < > 25.4*   < > 28.0* 26.0* 22.9*  MCV 113.8*  --  113.4*  --   --   --  111.2*  PLT 112*  --  108*  --   --   --  89*   < > = values in this interval not displayed.
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
## 2018-11-05 DIAGNOSIS — Z992 Dependence on renal dialysis: Secondary | ICD-10-CM

## 2018-11-05 DIAGNOSIS — N186 End stage renal disease: Secondary | ICD-10-CM

## 2018-11-05 LAB — CBC
HCT: 21.9 % — ABNORMAL LOW (ref 39.0–52.0)
Hemoglobin: 6.9 g/dL — CL (ref 13.0–17.0)
MCH: 35.8 pg — AB (ref 26.0–34.0)
MCHC: 31.5 g/dL (ref 30.0–36.0)
MCV: 113.5 fL — AB (ref 80.0–100.0)
Platelets: 84 10*3/uL — ABNORMAL LOW (ref 150–400)
RBC: 1.93 MIL/uL — ABNORMAL LOW (ref 4.22–5.81)
RDW: 15.9 % — ABNORMAL HIGH (ref 11.5–15.5)
WBC: 4.6 10*3/uL (ref 4.0–10.5)
nRBC: 0 % (ref 0.0–0.2)

## 2018-11-05 LAB — GLUCOSE, CAPILLARY
Glucose-Capillary: 89 mg/dL (ref 70–99)
Glucose-Capillary: 86 mg/dL (ref 70–99)

## 2018-11-05 LAB — RENAL FUNCTION PANEL
Albumin: 3.3 g/dL — ABNORMAL LOW (ref 3.5–5.0)
Anion gap: 15 (ref 5–15)
BUN: 83 mg/dL — ABNORMAL HIGH (ref 6–20)
CO2: 24 mmol/L (ref 22–32)
Calcium: 8.5 mg/dL — ABNORMAL LOW (ref 8.9–10.3)
Chloride: 99 mmol/L (ref 98–111)
Creatinine, Ser: 17.23 mg/dL — ABNORMAL HIGH (ref 0.61–1.24)
GFR calc Af Amer: 3 mL/min — ABNORMAL LOW (ref >60)
GFR, EST NON AFRICAN AMERICAN: 3 mL/min — AB (ref >60)
Glucose, Bld: 123 mg/dL — ABNORMAL HIGH (ref 70–99)
Phosphorus: 6.6 mg/dL — ABNORMAL HIGH (ref 2.5–4.6)
Potassium: 4.6 mmol/L (ref 3.5–5.1)
Sodium: 138 mmol/L (ref 135–145)

## 2018-11-05 LAB — PREPARE RBC (CROSSMATCH)

## 2018-11-05 LAB — ABO/RH: ABO/RH(D): O POS

## 2018-11-05 MED ORDER — SODIUM CHLORIDE 0.9% IV SOLUTION: Freq: Once | INTRAVENOUS | Status: DC

## 2018-11-05 MED ORDER — ACETAMINOPHEN 325 MG PO TABS
650.0000 mg | ORAL_TABLET | Freq: Four times a day (QID) | ORAL | Status: DC | PRN
  Administered 2018-11-06: 650 mg via ORAL
  Filled 2018-11-05: qty 2

## 2018-11-05 MED ORDER — CEFAZOLIN SODIUM-DEXTROSE 1-4 GM/50ML-% IV SOLN
1.0000 g | INTRAVENOUS | Status: AC
  Administered 2018-11-07: 1 g via INTRAVENOUS
  Filled 2018-11-05 (×2): qty 50

## 2018-11-05 MED ORDER — ALBUTEROL SULFATE (2.5 MG/3ML) 0.083% IN NEBU: 2.5000 mg | INHALATION_SOLUTION | RESPIRATORY_TRACT | Status: DC | PRN

## 2018-11-05 MED ORDER — VITAMIN B-1 100 MG PO TABS
100.0000 mg | ORAL_TABLET | Freq: Every day | ORAL | Status: DC
  Administered 2018-11-05 – 2018-11-07 (×3): 100 mg via ORAL
  Filled 2018-11-05 (×3): qty 1

## 2018-11-05 MED ORDER — FAMOTIDINE 20 MG PO TABS
20.0000 mg | ORAL_TABLET | Freq: Every day | ORAL | Status: DC
  Administered 2018-11-05 – 2018-11-07 (×3): 20 mg via ORAL
  Filled 2018-11-05 (×3): qty 1

## 2018-11-05 MED ORDER — HEPARIN SODIUM (PORCINE) 1000 UNIT/ML IJ SOLN
INTRAMUSCULAR | Status: AC
  Administered 2018-11-05: 2800 [IU]
  Filled 2018-11-05: qty 3

## 2018-11-05 MED ORDER — LORAZEPAM 2 MG/ML IJ SOLN
1.0000 mg | Freq: Once | INTRAMUSCULAR | Status: AC
  Administered 2018-11-05: 1 mg via INTRAVENOUS
  Filled 2018-11-05: qty 1

## 2018-11-05 NOTE — Procedures (Signed)
Critical lab value received - hgb 6.9. Dr. Joelyn Oms notifed, new orders received.  Type and crossmatch, and give 1 unit PRBC.  Give on HD if time permits (blood is available prior to end of treatment)

## 2018-11-05 NOTE — Progress Notes (Signed)
[place] TEAM 1 - Stepdown/ICU TEAM  Eesa Justiss  NWG:[id number] DOB: [birth year] DOA: 11/02/2018 PCP: [name], PA-C    Brief Narrative:  63 yo man with ESRD (on HD TTS) who presented to the ED after missed HD with jerking activity and acute encephalopathy. He was found to be hyperkalemic(7.0), hypoglycemic(62), and in hypertensive urgency w/ pulmonary edema. He was placed on CRRT andbecame hypotensive, tachycardic, and unresponsive after initiation.   Significant Events: 1/1 Admission > 1/2 Hypotensive, tachycardic, and altered during CRRT - intubated 1/3 extubation 1/4 TRH assumed care   Subjective: The patient is resting comfortably in bed.  He is alert and oriented.  He tells me he is very hungry and asks to be allowed to eat as soon as possible.  He denies chest pain shortness of breath nausea vomiting or abdominal pain.  He does not remember the events around the time of his admission.  Assessment & Plan:  ESRD on HD TTS Care per Nephrology  Hyperkalemia Corrected w/ HD  Uremia  Corrected with dialysis  AMS with inability to protect airway Resolved with patient now alert and oriented  Acute hypoxic respiratory failure - Pulmonary edema  Significantly improved -patient extubated yesterday  -wean oxygen as it appears he does not even need nasal cannula at this time  Hypoglycemia Resolved with CBG now stable  Anemia of chronic kidney disease  Hemoglobin drifting down -no evidence of acute blood loss -follow hemoglobin trend  Macrocytosis  Check V78 and folic acid   Hypertension  Blood pressure stable at this time  Hepatitis C LFTs unremarkable and physical exam without evidence of hepatomegaly  Moderate malnutrition in context of chronic illness  DVT prophylaxis: SQ heparin Code Status: FULL CODE Family Communication: no family present at time of exam  Disposition Plan: stable for tele bed - begin PT/OT - HD per Nephrology    Consultants:  Nephrology PCCM   Antimicrobials:  none  Objective: Blood pressure 135/85, pulse 95, temperature 98.6 F (37 C), temperature source Oral, resp. rate 18, weight 57.2 kg, SpO2 100 %.  Intake/Output Summary (Last 24 hours) at 11/05/2018 0852 Last data filed at 11/04/2018 1200 Gross per 24 hour  Intake 229.69 ml  Output 0 ml  Net 229.69 ml   Filed Weights   11/03/18 1315 11/03/18 1400  Weight: 60 kg 57.2 kg    Examination: General: No acute respiratory distress Lungs: Clear to auscultation bilaterally without wheezes or crackles Cardiovascular: Regular rate and rhythm without murmur gallop or rub normal S1 and S2 Abdomen: Nontender, nondistended, soft, bowel sounds positive, no rebound, no ascites, no appreciable mass Extremities: No significant cyanosis, clubbing, or edema bilateral lower extremities  CBC: Recent Labs  Lab 11/02/18 2121  11/03/18 0252  11/03/18 0845 11/03/18 1036 11/04/18 0647  WBC 5.0  --  5.4  --   --   --  4.0  NEUTROABS 3.3  --   --   --   --   --   --   HGB 8.2*   < > 8.1*   < > 9.5* 8.8* 7.5*  HCT 26.4*   < > 25.4*   < > 28.0* 26.0* 22.9*  MCV 113.8*  --  113.4*  --   --   --  111.2*  PLT 112*  --  108*  --   --   --  89*   < > = values in this interval not displayed.   Basic Metabolic Panel: Recent Labs  Lab  11/03/18 0252  11/03/18 0844  11/03/18 1036 11/03/18 1406 11/04/18 0647 11/04/18 1555  NA 145   < > 144   < > 142  --  138 137  K 6.1*   < > 6.2*   < > 5.9* 4.4 5.4* 5.0  CL 107   < > 106   < > 111  --  98 99  CO2 23  --  20*  --   --   --  24 23  GLUCOSE 85   < > 95   < > 110*  --  106* 100*  BUN 92*   < > 86*   < > 85*  --  54* 61*  CREATININE 17.86*   < > 16.75*   < > >18.00*  --  12.99* 13.99*  CALCIUM 9.3  --  9.2  --   --   --  9.0 9.0  PHOS 5.4*  --   --   --   --   --   --   --    < > = values in this interval not displayed.   GFR: Estimated Creatinine Clearance: 4.5 mL/min (A) (by C-G formula based on  SCr of 13.99 mg/dL (H)).  Liver Function Tests: Recent Labs  Lab 11/02/18 2121 11/03/18 0252 11/04/18 0647  AST 23  --  29  ALT 10  --  25  ALKPHOS 57  --  45  BILITOT 0.3  --  0.5  PROT 7.0  --  6.4*  ALBUMIN 3.6 3.4* 3.2*    CBG: Recent Labs  Lab 11/04/18 0743 11/04/18 1538 11/04/18 1950 11/05/18 0225 11/05/18 0807  GLUCAP 118* 88 91 89 86    Recent Results (from the past 240 hour(s))  MRSA PCR Screening     Status: None   Collection Time: 11/03/18  4:12 AM  Result Value Ref Range Status   MRSA by PCR NEGATIVE NEGATIVE Final    Comment:        The GeneXpert MRSA Assay (FDA approved for NASAL specimens only), is one component of a comprehensive MRSA colonization surveillance program. It is not intended to diagnose MRSA infection nor to guide or monitor treatment for MRSA infections. Performed at [facility], [place] [address] [place], [place] [address]      Scheduled Meds: . chlorhexidine gluconate (MEDLINE KIT)  15 mL Mouth Rinse BID  . Chlorhexidine Gluconate Cloth  6 each Topical Q0600  . famotidine  20 mg Per Tube Daily  . heparin  5,000 Units Subcutaneous Q8H  . mouth rinse  15 mL Mouth Rinse 10 times per day  . multivitamin  1 tablet Oral QHS  . sevelamer carbonate  3,200 mg Oral TID WC  . sodium chloride flush  3 mL Intravenous Q12H  . sodium zirconium cyclosilicate  5 g Oral BID  . thiamine injection  100 mg Intravenous Daily  . cyanocobalamin  2,000 mcg Oral Daily     LOS: 3 days   Cherene Altes, MD [facility] Office  [phone] Pager - Text Page per Amion  If 7PM-7AM, please contact night-coverage per Amion 11/05/2018, 8:52 AM

## 2018-11-05 NOTE — Progress Notes (Signed)
Pt lost IV. IV team consult was put in to have another IV placed for procedure on Monday where temp. HD cath will be removed and tunneled cath will be placed. IV team stated that the purple pigtail on HD cath can be used for medicine. This RN tried to explain to IV RN that we need another IV placed for the procedure that we are not using the HD cath for meds. Pt wants to wait and have IV placed before procedure.   Eleanora Neighbor, RN

## 2018-11-05 NOTE — Progress Notes (Signed)
Admit: 11/02/2018 LOS: 3  62M ESRD with AMS, Hyperkalemia, Myoclonus  Subjective:  . Extubated yesterday . Awake, alert back to baseline this AM . No AM labs, K was ok yest PM . Stable BPs not on pressors  01/03 0701 - 01/04 0700 In: 241.7 [I.V.:41.7; NG/GT:200] Out: 0   Filed Weights   11/03/18 1315 11/03/18 1400  Weight: 60 kg 57.2 kg    Scheduled Meds: . chlorhexidine gluconate (MEDLINE KIT)  15 mL Mouth Rinse BID  . Chlorhexidine Gluconate Cloth  6 each Topical Q0600  . famotidine  20 mg Per Tube Daily  . heparin  5,000 Units Subcutaneous Q8H  . mouth rinse  15 mL Mouth Rinse 10 times per day  . multivitamin  1 tablet Oral QHS  . sevelamer carbonate  3,200 mg Oral TID WC  . sodium chloride flush  3 mL Intravenous Q12H  . sodium zirconium cyclosilicate  5 g Oral BID  . thiamine injection  100 mg Intravenous Daily  . cyanocobalamin  2,000 mcg Oral Daily   Continuous Infusions: . sodium chloride    . sodium chloride    . dexmedetomidine (PRECEDEX) IV infusion Stopped (11/04/18 1001)  . phenylephrine (NEO-SYNEPHRINE) Adult infusion Stopped (11/04/18 0940)   PRN Meds:.sodium chloride, sodium chloride, acetaminophen **OR** acetaminophen, albuterol, hydrALAZINE, lidocaine-prilocaine, pentafluoroprop-tetrafluoroeth  Current Labs: reviewed    Physical Exam:  Blood pressure 120/83, pulse (!) 103, temperature 99 F (37.2 C), temperature source Oral, resp. rate 19, weight 57.2 kg, SpO2 100 %. , awake, alert, conversant RRR nl s1s2 R fem Temp HD cath C/D L FA AVF bandaged, not bloody; very faint bruit No LEE Coarse BS b/l  A 1. ESRD TTS L FA AVF [place] 2. AMS, resolving 3. Hyperkalemia, resolved 4. R Fem Temp HD Cath present, placed 1/2 by CCM 5. Likely L FA AVF Recirculation; might not be salvageable 6. Anemia of ESRD 7. CKD BMD; P 5.4 8. Intubated 1/2 for AMS; extuabted 1/3  P . Much improved . HD today: 2K, Fem HD Cath, 4h, Qb 400, Tight hep, Start 2L UF;  can come to HD unit . Will ask [facility] to eval AVF today . Medication Issues; o Preferred narcotic agents for pain control are hydromorphone, fentanyl, and methadone. Morphine should not be used.  o Baclofen should be avoided o Avoid oral sodium phosphate and magnesium citrate based laxatives / bowel preps      MD 11/05/2018, 8:04 AM  Recent Labs  Lab 11/03/18 0252  11/03/18 0844  11/03/18 1036 11/03/18 1406 11/04/18 0647 11/04/18 1555  NA 145   < > 144   < > 142  --  138 137  K 6.1*   < > 6.2*   < > 5.9* 4.4 5.4* 5.0  CL 107   < > 106   < > 111  --  98 99  CO2 23  --  20*  --   --   --  24 23  GLUCOSE 85   < > 95   < > 110*  --  106* 100*  BUN 92*   < > 86*   < > 85*  --  54* 61*  CREATININE 17.86*   < > 16.75*   < > >18.00*  --  12.99* 13.99*  CALCIUM 9.3  --  9.2  --   --   --  9.0 9.0  PHOS 5.4*  --   --   --   --   --   --   --    < > =   values in this interval not displayed.   Recent Labs  Lab 11/02/18 2121  11/03/18 0252  11/03/18 0845 11/03/18 1036 11/04/18 0647  WBC 5.0  --  5.4  --   --   --  4.0  NEUTROABS 3.3  --   --   --   --   --   --   HGB 8.2*   < > 8.1*   < > 9.5* 8.8* 7.5*  HCT 26.4*   < > 25.4*   < > 28.0* 26.0* 22.9*  MCV 113.8*  --  113.4*  --   --   --  111.2*  PLT 112*  --  108*  --   --   --  89*   < > = values in this interval not displayed.
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
## 2018-11-06 ENCOUNTER — Other Ambulatory Visit: Payer: Self-pay

## 2018-11-06 ENCOUNTER — Inpatient Hospital Stay (HOSPITAL_BASED_OUTPATIENT_CLINIC_OR_DEPARTMENT_OTHER): Payer: Medicare Other

## 2018-11-06 DIAGNOSIS — N186 End stage renal disease: Secondary | ICD-10-CM | POA: Diagnosis not present

## 2018-11-06 LAB — CBC
HCT: 23.7 % — ABNORMAL LOW (ref 39.0–52.0)
Hemoglobin: 7.9 g/dL — ABNORMAL LOW (ref 13.0–17.0)
MCH: 34.8 pg — ABNORMAL HIGH (ref 26.0–34.0)
MCHC: 33.3 g/dL (ref 30.0–36.0)
MCV: 104.4 fL — ABNORMAL HIGH (ref 80.0–100.0)
Platelets: 85 10*3/uL — ABNORMAL LOW (ref 150–400)
RBC: 2.27 MIL/uL — ABNORMAL LOW (ref 4.22–5.81)
RDW: 18.7 % — ABNORMAL HIGH (ref 11.5–15.5)
WBC: 3.7 10*3/uL — AB (ref 4.0–10.5)
nRBC: 0 % (ref 0.0–0.2)

## 2018-11-06 LAB — BPAM RBC
Blood Product Expiration Date: 202001292359
ISSUE DATE / TIME: 202001041650
Unit Type and Rh: 5100

## 2018-11-06 LAB — RENAL FUNCTION PANEL
Albumin: 3.2 g/dL — ABNORMAL LOW (ref 3.5–5.0)
Anion gap: 12 (ref 5–15)
BUN: 34 mg/dL — ABNORMAL HIGH (ref 6–20)
CALCIUM: 8.6 mg/dL — AB (ref 8.9–10.3)
CO2: 27 mmol/L (ref 22–32)
Chloride: 98 mmol/L (ref 98–111)
Creatinine, Ser: 9.05 mg/dL — ABNORMAL HIGH (ref 0.61–1.24)
GFR calc Af Amer: 7 mL/min — ABNORMAL LOW (ref >60)
GFR, EST NON AFRICAN AMERICAN: 6 mL/min — AB (ref >60)
Glucose, Bld: 102 mg/dL — ABNORMAL HIGH (ref 70–99)
Phosphorus: 5.7 mg/dL — ABNORMAL HIGH (ref 2.5–4.6)
Potassium: 3.8 mmol/L (ref 3.5–5.1)
SODIUM: 137 mmol/L (ref 135–145)

## 2018-11-06 LAB — TYPE AND SCREEN
ABO/RH(D): O POS
Antibody Screen: NEGATIVE
UNIT DIVISION: 0

## 2018-11-06 LAB — IRON AND TIBC
Iron: 58 ug/dL (ref 45–182)
Saturation Ratios: 21 % (ref 17.9–39.5)
TIBC: 270 ug/dL (ref 250–450)
UIBC: 212 ug/dL

## 2018-11-06 LAB — RETICULOCYTES
Immature Retic Fract: 17.2 % — ABNORMAL HIGH (ref 2.3–15.9)
RBC.: 2.27 MIL/uL — ABNORMAL LOW (ref 4.22–5.81)
RETIC COUNT ABSOLUTE: 37.2 10*3/uL (ref 19.0–186.0)
Retic Ct Pct: 1.6 % (ref 0.4–3.1)

## 2018-11-06 LAB — FERRITIN: Ferritin: 961 ng/mL — ABNORMAL HIGH (ref 24–336)

## 2018-11-06 LAB — FOLATE: Folate: 17.7 ng/mL (ref >5.9)

## 2018-11-06 LAB — VITAMIN B12: VITAMIN B 12: 680 pg/mL (ref 180–914)

## 2018-11-06 MED ORDER — SODIUM CHLORIDE 0.9% FLUSH: 10.0000 mL | INTRAVENOUS | Status: DC | PRN

## 2018-11-06 MED ORDER — HYDROCODONE-ACETAMINOPHEN 5-325 MG PO TABS
1.0000 | ORAL_TABLET | Freq: Four times a day (QID) | ORAL | Status: DC | PRN
  Administered 2018-11-06 – 2018-11-07 (×3): 1 via ORAL
  Filled 2018-11-06 (×3): qty 1

## 2018-11-06 NOTE — Progress Notes (Signed)
Consent obtained for procedure tomorrow. Placed in chart.   Eleanora Neighbor, RN

## 2018-11-06 NOTE — Progress Notes (Signed)
Family called the front desk stating that pt wallet and clothes were missing. This RN called security and the ED and they do not have anything by pt's name. Nothing is listed in belongings in chart either.   Eleanora Neighbor, RN

## 2018-11-06 NOTE — Progress Notes (Signed)
PROGRESS NOTE    Maurice Bell  MWN:[id number] DOB: 01-05-1956 DOA: 11/02/2018 PCP: [name], PA-C     Brief Narrative:  Maurice Bell is a 63 yo male ESRD on hemodialysis TTS who presented to the emergency department after missed dialysis with jerking activity, acute encephalopathy.  He was found to be hyperkalemic, hypoglycemic, and hypertensive urgency with pulmonary edema.  He was admitted to the ICU, underwent CRRT.  He was intubated on 1/2, extubated 1/3.  Transferred to [facility] 1/4.  New events last 24 hours / Subjective: Feeling well overall, has some chronic neck pain.  He is to undergo tunneled dialysis catheter placement 1/6.  Assessment & Plan:   Principal Problem:   Acute metabolic encephalopathy Active Problems:   ESRD (end stage renal disease) (HCC)   Hyperkalemia   Hypertensive urgency   Hypoglycemia without diagnosis of diabetes mellitus   Anemia of chronic disease   Endotracheal tube present   Malnutrition of moderate degree  ESRD on dialysis Tunneled dialysis catheter placement 1/6 Nephrology following   Acute metabolic encephalopathy with inability to protect airway Resolved, now extubated and remained stable  Acute hypoxemic respiratory failure secondary to pulmonary edema Resolved, now extubated and remains to be stable on room air  Anemia of chronic kidney disease Continue to monitor hemoglobin  Macrocytosis Q03, folic acid wnl    DVT prophylaxis: Subq hep Code Status: Full Family Communication: No family at bedside Disposition Plan: OR tmrw for [facility]   Consultants:   PCCM  Vascular surgery  Nephrology    Antimicrobials:  Anti-infectives (From admission, onward)   Start     Dose/Rate Route Frequency Ordered Stop   11/07/18 0000  ceFAZolin (ANCEF) IVPB 1 g/50 mL premix     1 g 100 mL/hr over 30 Minutes Intravenous On call 11/05/18 1449 11/08/18 0000        Objective: Vitals:   11/05/18 1955 11/06/18 0417 11/06/18  0930 11/06/18 1212  BP: (!) 158/100 (!) 149/87 (!) 147/87 (!) 147/87  Pulse: (!) 110 92 98 98  Resp: 16 20 18 18   Temp: 99.5 F (37.5 C) 99.9 F (37.7 C) 98.6 F (37 C) 98.6 F (37 C)  TempSrc: Oral Oral Oral Oral  SpO2: 100% 100% 100%   Weight: 58.3 kg   58.3 kg  Height:    5\' 5"  (1.651 m)    Intake/Output Summary (Last 24 hours) at 11/06/2018 1412 Last data filed at 11/06/2018 0951 Gross per 24 hour  Intake 1265 ml  Output 1042 ml  Net 223 ml   Filed Weights   11/05/18 1915 11/05/18 1955 11/06/18 1212  Weight: 56.6 kg 58.3 kg 58.3 kg    Examination:  General exam: Appears calm and comfortable  Respiratory system: Clear to auscultation. Respiratory effort normal. Cardiovascular system: S1 & S2 heard, RRR. No JVD, murmurs, rubs, gallops or clicks. No pedal edema. Gastrointestinal system: Abdomen is nondistended, soft and nontender. No organomegaly or masses felt. Normal bowel sounds heard. Central nervous system: Alert and oriented. No focal neurological deficits. Extremities: Symmetric 5 x 5 power. Skin: No rashes, lesions or ulcers Psychiatry: Judgement and insight appear normal. Mood & affect appropriate.   Data Reviewed: I have personally reviewed following labs and imaging studies  CBC: Recent Labs  Lab 11/02/18 2121  11/03/18 0252  11/03/18 0845 11/03/18 1036 11/04/18 0647 11/05/18 1519 11/06/18 0429  WBC 5.0  --  5.4  --   --   --  4.0 4.6 3.7*  NEUTROABS 3.3  --   --   --   --   --   --   --   --  HGB 8.2*   < > 8.1*   < > 9.5* 8.8* 7.5* 6.9* 7.9*  HCT 26.4*   < > 25.4*   < > 28.0* 26.0* 22.9* 21.9* 23.7*  MCV 113.8*  --  113.4*  --   --   --  111.2* 113.5* 104.4*  PLT 112*  --  108*  --   --   --  89* 84* 85*   < > = values in this interval not displayed.   Basic Metabolic Panel: Recent Labs  Lab 11/03/18 0252  11/03/18 0844  11/03/18 1036 11/03/18 1406 11/04/18 0647 11/04/18 1555 11/05/18 1519 11/06/18 0429  NA 145   < > 144   < > 142  --   138 137 138 137  K 6.1*   < > 6.2*   < > 5.9* 4.4 5.4* 5.0 4.6 3.8  CL 107   < > 106   < > 111  --  98 99 99 98  CO2 23  --  20*  --   --   --  24 23 24 27   GLUCOSE 85   < > 95   < > 110*  --  106* 100* 123* 102*  BUN 92*   < > 86*   < > 85*  --  54* 61* 83* 34*  CREATININE 17.86*   < > 16.75*   < > >18.00*  --  12.99* 13.99* 17.23* 9.05*  CALCIUM 9.3  --  9.2  --   --   --  9.0 9.0 8.5* 8.6*  PHOS 5.4*  --   --   --   --   --   --   --  6.6* 5.7*   < > = values in this interval not displayed.   GFR: Estimated Creatinine Clearance: 7.2 mL/min (A) (by C-G formula based on SCr of 9.05 mg/dL (H)). Liver Function Tests: Recent Labs  Lab 11/02/18 2121 11/03/18 0252 11/04/18 0647 11/05/18 1519 11/06/18 0429  AST 23  --  29  --   --   ALT 10  --  25  --   --   ALKPHOS 57  --  45  --   --   BILITOT 0.3  --  0.5  --   --   PROT 7.0  --  6.4*  --   --   ALBUMIN 3.6 3.4* 3.2* 3.3* 3.2*   No results for input(s): LIPASE, AMYLASE in the last 168 hours. No results for input(s): AMMONIA in the last 168 hours. Coagulation Profile: No results for input(s): INR, PROTIME in the last 168 hours. Cardiac Enzymes: No results for input(s): CKTOTAL, CKMB, CKMBINDEX, TROPONINI in the last 168 hours. BNP (last 3 results) No results for input(s): PROBNP in the last 8760 hours. HbA1C: No results for input(s): HGBA1C in the last 72 hours. CBG: Recent Labs  Lab 11/04/18 0743 11/04/18 1538 11/04/18 1950 11/05/18 0225 11/05/18 0807  GLUCAP 118* 88 91 89 86   Lipid Profile: No results for input(s): CHOL, HDL, LDLCALC, TRIG, CHOLHDL, LDLDIRECT in the last 72 hours. Thyroid Function Tests: No results for input(s): TSH, T4TOTAL, FREET4, T3FREE, THYROIDAB in the last 72 hours. Anemia Panel: Recent Labs    11/06/18 0429  VITAMINB12 680  FOLATE 17.7  FERRITIN 961*  TIBC 270  IRON 58  RETICCTPCT 1.6   Sepsis Labs: No results for input(s): PROCALCITON, LATICACIDVEN in the last 168  hours.  Recent Results (from the past 240 hour(s))  MRSA  PCR Screening     Status: None   Collection Time: 11/03/18  4:12 AM  Result Value Ref Range Status   MRSA by PCR NEGATIVE NEGATIVE Final    Comment:        The GeneXpert MRSA Assay (FDA approved for NASAL specimens only), is one component of a comprehensive MRSA colonization surveillance program. It is not intended to diagnose MRSA infection nor to guide or monitor treatment for MRSA infections. Performed at [facility], [place] [address] [place], [place] [address]        Radiology Studies: Vas [place] Upper Ext Vein Mapping (pre-op Avf)  Result Date: 11/06/2018 UPPER EXTREMITY VEIN MAPPING  Indications: Pre-operative for dialysis access. History: Non working, Bilateral radiocephalic AV fistuals.  Performing Technologist: Sharion Dove RVS  Examination Guidelines: A complete evaluation includes B-mode imaging, spectral Doppler, color Doppler, and power Doppler as needed of all accessible portions of each vessel. Bilateral testing is considered an integral part of a complete examination. Limited examinations for reoccurring indications may be performed as noted. +-----------------+-------------+----------+--------------+ Right Cephalic   Diameter (cm)Depth (cm)   Findings    +-----------------+-------------+----------+--------------+ Prox upper arm                          not visualized +-----------------+-------------+----------+--------------+ Mid upper arm                           not visualized +-----------------+-------------+----------+--------------+ Dist upper arm       0.16        0.14                  +-----------------+-------------+----------+--------------+ Antecubital fossa                       not visualized +-----------------+-------------+----------+--------------+ Prox forearm                            not visualized +-----------------+-------------+----------+--------------+ Mid  forearm                             not visualized +-----------------+-------------+----------+--------------+ Dist forearm                            not visualized +-----------------+-------------+----------+--------------+ Wrist                                   not visualized +-----------------+-------------+----------+--------------+ +-----------------+-------------+----------+---------+ Right Basilic    Diameter (cm)Depth (cm)Findings  +-----------------+-------------+----------+---------+ Mid upper arm        0.18        0.20             +-----------------+-------------+----------+---------+ Dist upper arm       0.32        0.49             +-----------------+-------------+----------+---------+ Antecubital fossa    0.32        0.29             +-----------------+-------------+----------+---------+ Prox forearm         0.36        0.22   branching +-----------------+-------------+----------+---------+ Mid forearm          0.31  0.23   branching +-----------------+-------------+----------+---------+ Distal forearm       0.24        0.19             +-----------------+-------------+----------+---------+ Wrist                0.17        0.20             +-----------------+-------------+----------+---------+ +-----------------+-------------+----------+--------------+ Left Cephalic    Diameter (cm)Depth (cm)   Findings    +-----------------+-------------+----------+--------------+ Prox upper arm                          not visualized +-----------------+-------------+----------+--------------+ Mid upper arm                           not visualized +-----------------+-------------+----------+--------------+ Dist upper arm                          not visualized +-----------------+-------------+----------+--------------+ Antecubital fossa                       not visualized +-----------------+-------------+----------+--------------+  Prox forearm                            not visualized +-----------------+-------------+----------+--------------+ Mid forearm                             not visualized +-----------------+-------------+----------+--------------+ Wrist                                   not visualized +-----------------+-------------+----------+--------------+ +-----------------+-------------+----------+--------------+ Left Basilic     Diameter (cm)Depth (cm)   Findings    +-----------------+-------------+----------+--------------+ Prox upper arm                          not visualized +-----------------+-------------+----------+--------------+ Mid upper arm        0.12        0.16                  +-----------------+-------------+----------+--------------+ Dist upper arm       0.19        0.19                  +-----------------+-------------+----------+--------------+ Antecubital fossa    0.16        0.18                  +-----------------+-------------+----------+--------------+ Prox forearm         0.19        0.14                  +-----------------+-------------+----------+--------------+ Mid forearm          0.18        0.13                  +-----------------+-------------+----------+--------------+ Wrist                0.12        0.21                  +-----------------+-------------+----------+--------------+ *See table(s) above for measurements and observations.  Diagnosing physician: Ruta Hinds MD Electronically signed  by Ruta Hinds MD on 11/06/2018 at 12:28:43 PM.    Final       Scheduled Meds: . sodium chloride   Intravenous Once  . Chlorhexidine Gluconate Cloth  6 each Topical Q0600  . famotidine  20 mg Oral Daily  . heparin  5,000 Units Subcutaneous Q8H  . multivitamin  1 tablet Oral QHS  . sevelamer carbonate  3,200 mg Oral TID WC  . sodium chloride flush  3 mL Intravenous Q12H  . thiamine  100 mg Oral Daily  . cyanocobalamin  2,000 mcg Oral  Daily   Continuous Infusions: . [START ON 11/07/2018]  ceFAZolin (ANCEF) IV       LOS: 4 days    Time spent: 35 minutes   Dessa Phi, DO [facility] [email or web address] Password [email or web address] 11/06/2018, 2:12 PM

## 2018-11-06 NOTE — Evaluation (Signed)
Physical Therapy Evaluation Patient Details Name: Maurice Bell MRN: [id number] DOB: [birth year] Today's Date: 11/06/2018   History of Present Illness  63 yo man with ESRD (on HD TTS) who presented to the ED after missed HD with jerking activity and acute encephalopathy. He was found to be hyperkalemic (7.0) , hypoglycemic (62), and in hypertensive urgency w/ pulmonary edema. He was placed on CRRT and became hypotensive, tachycardic, and unresponsive after initiation. Intubated 1/2 extubated 1/3. Transferred to [facility] 1/4.  Clinical Impression  PTA pt independent with driving himself to dialysis, and taking care of his mother with dementia, although also reports numerous falls lately and occasional need to use RW for support. Pt currently limited in safe mobility by decreased balance and strength secondary to generalized weakness. Pt requires min A for steadying with standing and for ambulation in hallway without AD. Ambulation was able to progress to min guard for safety. PT recommends HHPT level rehab at discharge to work on strength and balance for safe navigation of his home environment. PT will continue to follow acutely.     Follow Up Recommendations Home health PT;Supervision - Intermittent    Equipment Recommendations  None recommended by PT       Precautions / Restrictions Precautions Precautions: Fall Precaution Comments: hx of falling Restrictions Weight Bearing Restrictions: No      Mobility  Bed Mobility Overal bed mobility: Modified Independent             General bed mobility comments: increased time and effort  Transfers Overall transfer level: Needs assistance Equipment used: None Transfers: Sit to/from Stand Sit to Stand: Min assist         General transfer comment: min A for steadying with inital standing, once steadied able to static stand without assist  Ambulation/Gait Ambulation/Gait assistance: Min assist Gait Distance (Feet): 150  Feet Assistive device: None Gait Pattern/deviations: Drifts right/left;Trunk flexed;Shuffle;Step-through pattern Gait velocity: WFL Gait velocity interpretation: >2.62 ft/sec, indicative of community ambulatory General Gait Details: min A progressing to min guard for steadying with ambulation,drifting R/L in the hallway          Balance Overall balance assessment: Needs assistance Sitting-balance support: Feet supported;No upper extremity supported Sitting balance-Leahy Scale: Good Sitting balance - Comments: able to reach outside BOS    Standing balance support: No upper extremity supported;During functional activity Standing balance-Leahy Scale: Fair                               Pertinent Vitals/Pain Pain Assessment: No/denies pain    Home Living Family/patient expects to be discharged to:: Private residence Living Arrangements: Parent;Children Available Help at Discharge: Family;Available PRN/intermittently(son) Type of Home: House Home Access: Level entry     Home Layout: One level Home Equipment: Walker - 2 wheels;Cane - single point      Prior Function Level of Independence: Independent         Comments: Drives to dialysis, 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        Extremity/Trunk Assessment   Upper Extremity Assessment Upper Extremity Assessment: Generalized weakness    Lower Extremity Assessment Lower Extremity Assessment: Generalized weakness    Cervical / Trunk Assessment Cervical / Trunk Assessment: Kyphotic;Other 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 )  Communication   Communication: No difficulties  Cognition Arousal/Alertness: Awake/alert Behavior During Therapy: WFL for tasks assessed/performed Overall Cognitive Status: Within Functional Limits for tasks assessed  General Comments: A&O x4      General Comments General comments (skin integrity, edema, etc.): VSS         Assessment/Plan    PT Assessment Patient needs continued PT services  PT Problem List Decreased strength;Decreased activity tolerance;Decreased balance;Decreased mobility       PT Treatment Interventions DME instruction;Gait training;Functional mobility training;Therapeutic activities;Therapeutic exercise;Balance training;Cognitive remediation;Patient/family education    PT Goals (Current goals can be found in the Care Plan section)  Acute Rehab PT Goals Patient Stated Goal: go home PT Goal Formulation: With patient Time For Goal Achievement: 11/20/18 Potential to Achieve Goals: Fair    Frequency Min 3X/week    AM-PAC PT "6 Clicks" Mobility  Outcome Measure Help needed turning from your back to your side while in a flat bed without using bedrails?: None Help needed moving from lying on your back to sitting on the side of a flat bed without using bedrails?: None Help needed moving to and from a bed to a chair (including a wheelchair)?: A Little Help needed standing up from a chair using your arms (e.g., wheelchair or bedside chair)?: A Little Help needed to walk in hospital room?: A Little Help needed climbing 3-5 steps with a railing? : A Lot 6 Click Score: 19    End of Session Equipment Utilized During Treatment: Gait belt Activity Tolerance: Patient tolerated treatment well Patient left: in bed;with call bell/phone within reach;with bed alarm set;with nursing/sitter in room Nurse Communication: Mobility status PT Visit Diagnosis: Unsteadiness on feet (R26.81);Other abnormalities of gait and mobility (R26.89);Muscle weakness (generalized) (M62.81);Difficulty in walking, not elsewhere classified (R26.2)    Time: 5427-0623 PT Time Calculation (min) (ACUTE ONLY): 21 min   Charges:   PT Evaluation $PT Eval Moderate Complexity: 1 Mod          Nhu Glasby B. Migdalia Dk PT, DPT Acute Rehabilitation Services Pager [phone] Office [phone]   [place] 11/06/2018, 2:45 PM

## 2018-11-06 NOTE — Progress Notes (Signed)
VASCULAR LAB PRELIMINARY  PRELIMINARY  PRELIMINARY  PRELIMINARY  UE mapping completed.    Preliminary report:     Kanani Mowbray, RVT 11/06/2018, 10:21 AM

## 2018-11-06 NOTE — Progress Notes (Signed)
[facility] Progress Note   Subjective:  Seen in room, alert, no new complaints. Denies CP,SOB, N/V/D To get temp cath removed, new [facility] tomorrow  HD yesterday with no issues   Objective Vitals:   11/05/18 1830 11/05/18 1915 11/05/18 1955 11/06/18 0417  BP: (!) 145/77 (!) 144/83 (!) 158/100 (!) 149/87  Pulse: (!) 103 (!) 102 (!) 110 92  Resp: 18 18 16 20   Temp:  97.7 F (36.5 C) 99.5 F (37.5 C) 99.9 F (37.7 C)  TempSrc:  Oral Oral Oral  SpO2:  98% 100% 100%  Weight:  56.6 kg 58.3 kg    Physical Exam General: WNWD male NAD  Heart: RRR Lungs: CTAB Abdomen: soft NT/ND Extremities: No LE edema  Dialysis Access: R fem temp cath in place   Dialysis Orders:  [facility] T[facility] 3.75h 450/600 2K/2.5Ca   63 yo male ESRD on HD. Admitted with AMS Hyperkalemia, myoclonus -likely 2/2 recirculation from malfunctioning L AVF.  Required intubation/pressors/CRRT. Extubated 1/3,  back to baseline status  Assessment/Plan: 1. Hyperkalemia/Uremia/AMS on admit - corrected with HD. K 3.8  2. Malfunctioning L A AVF - Had temp cath placed 1/2 . VVS consulted. Will place new [facility] tomorrow and then f/u for new access as outpatient. Likely discharge after [facility] placement.  3. Volume overload/pulm edema - corrected with HD. Sats good on RA. Below EDW, will lower at discharge  4. ESRD - TTS. Continue on schedule  Next HD 1/7 5. Anemia - Hgb dropped 6.9. Up to 7.9 s/p 1 u prbcs yesterday. Denies overt losses/melena. Check FOBT Tsat 21% Ferritin 961. Can give short Fe course as outpatient and resume ESA  6. MBD-  Ca/Phos ok. Continue VDRA/Binders  7. HTN/volume - BP stable. Continue titrate volume as outpatient.   Lynnda Child PA-C [facility] Pager [phone] 11/06/2018,9:09 AM  LOS: 4 days   Additional Objective Labs: Basic Metabolic Panel: Recent Labs  Lab 11/03/18 0252  11/04/18 1555 11/05/18 1519 11/06/18 0429  NA 145   < > 137 138 137  K 6.1*   < > 5.0 4.6 3.8   CL 107   < > 99 99 98  CO2 23   < > 23 24 27   GLUCOSE 85   < > 100* 123* 102*  BUN 92*   < > 61* 83* 34*  CREATININE 17.86*   < > 13.99* 17.23* 9.05*  CALCIUM 9.3   < > 9.0 8.5* 8.6*  PHOS 5.4*  --   --  6.6* 5.7*   < > = values in this interval not displayed.   CBC: Recent Labs  Lab 11/02/18 2121  11/03/18 0252  11/04/18 0647 11/05/18 1519 11/06/18 0429  WBC 5.0  --  5.4  --  4.0 4.6 3.7*  NEUTROABS 3.3  --   --   --   --   --   --   HGB 8.2*   < > 8.1*   < > 7.5* 6.9* 7.9*  HCT 26.4*   < > 25.4*   < > 22.9* 21.9* 23.7*  MCV 113.8*  --  113.4*  --  111.2* 113.5* 104.4*  PLT 112*  --  108*  --  89* 84* 85*   < > = values in this interval not displayed.   Blood Culture    Component Value Date/Time   SDES BLOOD BLOOD LEFT HAND 09/29/2018 0915   SPECREQUEST  09/29/2018 0915    BOTTLES DRAWN AEROBIC AND ANAEROBIC Blood Culture  adequate volume   CULT  09/29/2018 0915    NO GROWTH 5 DAYS Performed at [facility], [place] [address], [place], [place] [address]    REPTSTATUS 10/04/2018 FINAL 09/29/2018 0915    Cardiac Enzymes: No results for input(s): CKTOTAL, CKMB, CKMBINDEX, TROPONINI in the last 168 hours. CBG: Recent Labs  Lab 11/04/18 0743 11/04/18 1538 11/04/18 1950 11/05/18 0225 11/05/18 0807  GLUCAP 118* 88 91 89 86   Iron Studies:  Recent Labs    11/06/18 0429  IRON 58  TIBC 270  FERRITIN 961*   Lab Results  Component Value Date   INR 1.12 09/29/2018   INR 1.05 09/06/2018   Medications: . [START ON 11/07/2018]  ceFAZolin (ANCEF) IV     . sodium chloride   Intravenous Once  . Chlorhexidine Gluconate Cloth  6 each Topical Q0600  . famotidine  20 mg Oral Daily  . heparin  5,000 Units Subcutaneous Q8H  . multivitamin  1 tablet Oral QHS  . sevelamer carbonate  3,200 mg Oral TID WC  . sodium chloride flush  3 mL Intravenous Q12H  . thiamine  100 mg Oral Daily  . cyanocobalamin  2,000 mcg Oral Daily

## 2018-11-07 ENCOUNTER — Other Ambulatory Visit: Payer: Self-pay

## 2018-11-07 ENCOUNTER — Inpatient Hospital Stay (HOSPITAL_COMMUNITY): Payer: Medicare Other | Admitting: Certified Registered"

## 2018-11-07 ENCOUNTER — Encounter (HOSPITAL_COMMUNITY)
Admission: EM | Disposition: A | Discharge status: Home Health | Payer: Self-pay | Source: Home / Self Care | Attending: Pulmonary Disease

## 2018-11-07 ENCOUNTER — Inpatient Hospital Stay (HOSPITAL_COMMUNITY): Payer: Medicare Other

## 2018-11-07 ENCOUNTER — Encounter (HOSPITAL_COMMUNITY): Payer: Self-pay

## 2018-11-07 ENCOUNTER — Telehealth: Payer: Self-pay

## 2018-11-07 HISTORY — PX: INSERTION OF DIALYSIS CATHETER: SHX1324

## 2018-11-07 LAB — BASIC METABOLIC PANEL
Anion gap: 15 (ref 5–15)
BUN: 53 mg/dL — ABNORMAL HIGH (ref 6–20)
CO2: 28 mmol/L (ref 22–32)
Calcium: 8 mg/dL — ABNORMAL LOW (ref 8.9–10.3)
Chloride: 94 mmol/L — ABNORMAL LOW (ref 98–111)
Creatinine, Ser: 12.13 mg/dL — ABNORMAL HIGH (ref 0.61–1.24)
GFR calc Af Amer: 5 mL/min — ABNORMAL LOW (ref >60)
GFR calc non Af Amer: 4 mL/min — ABNORMAL LOW (ref >60)
Glucose, Bld: 88 mg/dL (ref 70–99)
Potassium: 4.2 mmol/L (ref 3.5–5.1)
Sodium: 137 mmol/L (ref 135–145)

## 2018-11-07 LAB — CBC
HCT: 23.3 % — ABNORMAL LOW (ref 39.0–52.0)
Hemoglobin: 7.5 g/dL — ABNORMAL LOW (ref 13.0–17.0)
MCH: 34.1 pg — AB (ref 26.0–34.0)
MCHC: 32.2 g/dL (ref 30.0–36.0)
MCV: 105.9 fL — ABNORMAL HIGH (ref 80.0–100.0)
Platelets: 111 10*3/uL — ABNORMAL LOW (ref 150–400)
RBC: 2.2 MIL/uL — ABNORMAL LOW (ref 4.22–5.81)
RDW: 17.6 % — ABNORMAL HIGH (ref 11.5–15.5)
WBC: 3.9 10*3/uL — ABNORMAL LOW (ref 4.0–10.5)
nRBC: 0 % (ref 0.0–0.2)

## 2018-11-07 LAB — SURGICAL PCR SCREEN
MRSA, PCR: NEGATIVE
Staphylococcus aureus: NEGATIVE

## 2018-11-07 SURGERY — INSERTION OF DIALYSIS CATHETER
Anesthesia: Monitor Anesthesia Care | Site: Chest | Laterality: Left

## 2018-11-07 MED ORDER — IODIXANOL 320 MG/ML IV SOLN
INTRAVENOUS | Status: DC | PRN
  Administered 2018-11-07: 50 mL

## 2018-11-07 MED ORDER — OXYCODONE-ACETAMINOPHEN 5-325 MG PO TABS
1.0000 | ORAL_TABLET | ORAL | Status: DC | PRN
  Administered 2018-11-07: 2 via ORAL
  Filled 2018-11-07: qty 2

## 2018-11-07 MED ORDER — 0.9 % SODIUM CHLORIDE (POUR BTL) OPTIME
TOPICAL | Status: DC | PRN
  Administered 2018-11-07: 1000 mL

## 2018-11-07 MED ORDER — SODIUM CHLORIDE 0.9 % IV SOLN
INTRAVENOUS | Status: DC
  Administered 2018-11-07 (×2): via INTRAVENOUS

## 2018-11-07 MED ORDER — ACETAMINOPHEN 10 MG/ML IV SOLN: 1000.0000 mg | Freq: Once | INTRAVENOUS | Status: DC | PRN

## 2018-11-07 MED ORDER — ACETAMINOPHEN 160 MG/5ML PO SOLN: 1000.0000 mg | Freq: Once | ORAL | Status: DC | PRN

## 2018-11-07 MED ORDER — HEPARIN SODIUM (PORCINE) 1000 UNIT/ML IJ SOLN
INTRAMUSCULAR | Status: DC | PRN
  Administered 2018-11-07: 1000 [IU]

## 2018-11-07 MED ORDER — PROPOFOL 500 MG/50ML IV EMUL
INTRAVENOUS | Status: DC | PRN
  Administered 2018-11-07: 75 ug/kg/min via INTRAVENOUS

## 2018-11-07 MED ORDER — OXYCODONE HCL 5 MG PO TABS: 5.0000 mg | ORAL_TABLET | Freq: Once | ORAL | Status: DC | PRN

## 2018-11-07 MED ORDER — ACETAMINOPHEN 500 MG PO TABS: 1000.0000 mg | ORAL_TABLET | Freq: Once | ORAL | Status: DC | PRN

## 2018-11-07 MED ORDER — HEPARIN SODIUM (PORCINE) 1000 UNIT/ML IJ SOLN
INTRAMUSCULAR | Status: AC
  Filled 2018-11-07: qty 1

## 2018-11-07 MED ORDER — OXYCODONE HCL 5 MG/5ML PO SOLN: 5.0000 mg | Freq: Once | ORAL | Status: DC | PRN

## 2018-11-07 MED ORDER — SODIUM CHLORIDE 0.9% FLUSH: 3.0000 mL | INTRAVENOUS | Status: DC | PRN

## 2018-11-07 MED ORDER — FENTANYL CITRATE (PF) 100 MCG/2ML IJ SOLN: 25.0000 ug | INTRAMUSCULAR | Status: DC | PRN

## 2018-11-07 MED ORDER — LIDOCAINE-EPINEPHRINE (PF) 1 %-1:200000 IJ SOLN
INTRAMUSCULAR | Status: DC | PRN
  Administered 2018-11-07: 30 mL

## 2018-11-07 MED ORDER — SODIUM CHLORIDE 0.9 % IV SOLN
INTRAVENOUS | Status: DC | PRN
  Administered 2018-11-07: 08:00:00

## 2018-11-07 SURGICAL SUPPLY — 56 items
ADH SKN CLS APL DERMABOND .7 (GAUZE/BANDAGES/DRESSINGS) ×1
BAG BANDED W/RUBBER/TAPE 36X54 (MISCELLANEOUS) ×2 IMPLANT
BAG DECANTER FOR FLEXI CONT (MISCELLANEOUS) ×3 IMPLANT
BAG EQP BAND 135X91 W/RBR TAPE (MISCELLANEOUS) ×1
BIOPATCH RED 1 DISK 7.0 (GAUZE/BANDAGES/DRESSINGS) ×2 IMPLANT
BIOPATCH RED 1IN DISK 7.0MM (GAUZE/BANDAGES/DRESSINGS) ×1
CATH BEACON 5 .035 65 KMP TIP (CATHETERS) ×2 IMPLANT
CATH PALINDROME RT-P 15FX19CM (CATHETERS) IMPLANT
CATH PALINDROME RT-P 15FX23CM (CATHETERS) IMPLANT
CATH PALINDROME RT-P 15FX28CM (CATHETERS) ×2 IMPLANT
CATH PALINDROME RT-P 15FX55CM (CATHETERS) IMPLANT
CATH STRAIGHT 5FR 65CM (CATHETERS) IMPLANT
COVER DOME SNAP 22 D (MISCELLANEOUS) ×2 IMPLANT
COVER PROBE W GEL 5X96 (DRAPES) ×3 IMPLANT
COVER SURGICAL LIGHT HANDLE (MISCELLANEOUS) ×3 IMPLANT
COVER WAND RF STERILE (DRAPES) ×3 IMPLANT
DECANTER SPIKE VIAL GLASS SM (MISCELLANEOUS) ×3 IMPLANT
DERMABOND ADVANCED (GAUZE/BANDAGES/DRESSINGS) ×2
DERMABOND ADVANCED .7 DNX12 (GAUZE/BANDAGES/DRESSINGS) ×1 IMPLANT
DEVICE TORQUE KENDALL .025-038 (MISCELLANEOUS) ×2 IMPLANT
DRAPE C-ARM 42X72 X-RAY (DRAPES) ×3 IMPLANT
DRAPE CHEST BREAST 15X10 FENES (DRAPES) ×3 IMPLANT
GAUZE 4X4 16PLY RFD (DISPOSABLE) ×3 IMPLANT
GLOVE BIO SURGEON STRL SZ7.5 (GLOVE) ×3 IMPLANT
GLOVE BIOGEL PI IND STRL 7.0 (GLOVE) IMPLANT
GLOVE BIOGEL PI IND STRL 8 (GLOVE) ×1 IMPLANT
GLOVE BIOGEL PI INDICATOR 7.0 (GLOVE) ×2
GLOVE BIOGEL PI INDICATOR 8 (GLOVE) ×2
GLOVE ECLIPSE 7.5 STRL STRAW (GLOVE) ×2 IMPLANT
GOWN STRL REUS W/ TWL LRG LVL3 (GOWN DISPOSABLE) ×2 IMPLANT
GOWN STRL REUS W/ TWL XL LVL3 (GOWN DISPOSABLE) ×2 IMPLANT
GOWN STRL REUS W/TWL LRG LVL3 (GOWN DISPOSABLE) ×3
GOWN STRL REUS W/TWL XL LVL3 (GOWN DISPOSABLE) ×9
GUIDEWIRE ANGLED .035X150CM (WIRE) ×2 IMPLANT
KIT BASIN OR (CUSTOM PROCEDURE TRAY) ×3 IMPLANT
KIT TURNOVER KIT B (KITS) ×3 IMPLANT
NDL 18GX1X1/2 (RX/OR ONLY) (NEEDLE) ×1 IMPLANT
NDL HYPO 25GX1X1/2 BEV (NEEDLE) ×1 IMPLANT
NEEDLE 18GX1X1/2 (RX/OR ONLY) (NEEDLE) ×3 IMPLANT
NEEDLE HYPO 25GX1X1/2 BEV (NEEDLE) ×3 IMPLANT
NS IRRIG 1000ML POUR BTL (IV SOLUTION) ×3 IMPLANT
PACK SURGICAL SETUP 50X90 (CUSTOM PROCEDURE TRAY) ×3 IMPLANT
PAD ARMBOARD 7.5X6 YLW CONV (MISCELLANEOUS) ×6 IMPLANT
SET MICROPUNCTURE 5F STIFF (MISCELLANEOUS) ×2 IMPLANT
SOAP 2 % CHG 4 OZ (WOUND CARE) ×3 IMPLANT
SUT ETHILON 3 0 PS 1 (SUTURE) ×3 IMPLANT
SUT MNCRL AB 4-0 PS2 18 (SUTURE) ×3 IMPLANT
SYR 10ML LL (SYRINGE) ×3 IMPLANT
SYR 20CC LL (SYRINGE) ×6 IMPLANT
SYR 5ML LL (SYRINGE) ×3 IMPLANT
SYR CONTROL 10ML LL (SYRINGE) ×3 IMPLANT
TOWEL GREEN STERILE (TOWEL DISPOSABLE) ×3 IMPLANT
TOWEL GREEN STERILE FF (TOWEL DISPOSABLE) ×3 IMPLANT
WATER STERILE IRR 1000ML POUR (IV SOLUTION) ×3 IMPLANT
WIRE AMPLATZ SS-J .035X180CM (WIRE) IMPLANT
WIRE BENTSON .035X145CM (WIRE) ×2 IMPLANT

## 2018-11-07 NOTE — Op Note (Signed)
    NAME: Maurice Bell    MRN: [id number] DOB: [birth year]    DATE OF OPERATION: 11/07/2018  PREOP DIAGNOSIS:    End-stage renal disease  POSTOP DIAGNOSIS:    Same  PROCEDURE:    Ultrasound-guided placement of left IJ tunneled dialysis catheter Central venogram  SURGEON: Judeth Cornfield. Scot Dock, MD, FACS  ASSIST: None  ANESTHESIA: Local with sedation  EBL: Minimal  INDICATIONS:    [name] is a 63 y.o. male who presents for a new catheter.   FINDINGS:   Patent left IJ.  TECHNIQUE:    The patient was brought to the operating room and sedated.  I looked at both IJ's with the SonoSite and the right IJ appeared small.  I therefore selected a left-sided approach.  The neck and upper chest were prepped and draped in usual sterile fashion.  Under ultrasound guidance, after the skin was anesthetized, I cannulated the left IJ with a micropuncture needle and a micropuncture sheath was introduced over the wire.  I then placed an angled Glidewire and had a difficult time manipulating this down into the superior vena cava.  I shot contrast through the Kumpe catheter and attained a central venogram.  I therefore used a Kumpe catheter and the angled Glidewire to direct the wire down into the right atrium.  I then exchanged for a Bentson wire.  The tract over the Bentson wire was dilated and then the dilator and peel-away sheath were advanced over the wire and the dilator removed.  The catheter was threaded over the wire through the peel-away sheath into the right atrium and the wire and peel-away sheath were removed.  The exit site for the catheter was selected and the skin anesthetized between the 2 areas.  The catheter was brought through the tunnel cut to the appropriate length and the distal ports were attached.  Both ports withdrew easily with and flushed with heparinized saline and filled with concentrated heparin.  The catheter was secured at its exit site with a 3-0 nylon suture.   The IJ cannulation site was closed with a 4-0 subcuticular stitch.  Sterile dressing was applied.  The patient tolerated the procedure well.  He was transferred to the recovery room in stable condition.   Deitra Mayo, MD, FACS [facility]  DATE OF DICTATION:   11/07/2018

## 2018-11-07 NOTE — Anesthesia Procedure Notes (Signed)
Procedure Name: MAC Date/Time: 11/07/2018 8:41 AM Performed by: Carney Living, CRNA Pre-anesthesia Checklist: Patient identified, Emergency Drugs available, Suction available, Patient being monitored and Timeout performed Patient Re-evaluated:Patient Re-evaluated prior to induction Oxygen Delivery Method: Simple face mask

## 2018-11-07 NOTE — Telephone Encounter (Signed)
The [facility] venogram was cancelled for 1-8; Dr. Scot Dock did it along with his [facility] placement on 11-07-2018

## 2018-11-07 NOTE — Transfer of Care (Signed)
Immediate Anesthesia Transfer of Care Note  Patient: Maurice Bell  Procedure(s) Performed: INSERTION OF TUNNELED DIALYSIS CATHETER - LEFT INTERNAL JUGULAR PLACEMENT (Left Chest)  Patient Location: PACU  Anesthesia Type:MAC  Level of Consciousness: awake, alert , oriented and patient cooperative  Airway & Oxygen Therapy: Patient Spontanous Breathing and Patient connected to nasal cannula oxygen  Post-op Assessment: Report given to RN, Post -op Vital signs reviewed and stable and Patient moving all extremities X 4  Post vital signs: Reviewed and stable  Last Vitals:  Vitals Value Taken Time  BP 154/87 11/07/2018  9:42 AM  Temp 36.5 C 11/07/2018  9:42 AM  Pulse 105 11/07/2018  9:42 AM  Resp 17 11/07/2018  9:42 AM  SpO2 97 % 11/07/2018  9:42 AM  Vitals shown include unvalidated device data.  Last Pain:  Vitals:   11/07/18 0447  TempSrc:   PainSc: 9       Patients Stated Pain Goal: 0 (74/16/38 4536)  Complications: No apparent anesthesia complications

## 2018-11-07 NOTE — Progress Notes (Signed)
OT Screen Note  Patient Details Name: Maurice Bell MRN: [id number] DOB: 30-Oct-1956   Cancelled Treatment:    Reason Eval/Treat Not Completed: OT screened, no needs identified, will sign off. Pt reporting he "doesn't feel fully back to normal" but feels safe to go home and performing BADLs. Pt agreeable to HHPT and states he doesn't need any additional DME. RN arriving during conversation to perform dc. All acute OT needs met and will sign off. Thank you.  [place], OTR/L Acute Rehab Pager: [phone] Office: [phone] 11/07/2018, 1:29 PM

## 2018-11-07 NOTE — Progress Notes (Signed)
Maylene Roes, MD gave verbal order for patient to leave unit for OR without telemetry. Orders followed. Will continue to monitor.

## 2018-11-07 NOTE — Progress Notes (Signed)
MD gave verbal order to D/C femoral HD cath via IV team. Order placed. Will continue to monitor.

## 2018-11-07 NOTE — Telephone Encounter (Signed)
Pt. Is currently in the OR for a procedure and is in patient. Spoke with Anisha his floor nurse regarding his upcoming venogram scheduled for 1/8. Gave her pt's pre procedure instructions and she will give them to him if he is discharged home.

## 2018-11-07 NOTE — Progress Notes (Signed)
Maurice Bell to be D/C'd Home per MD order.  Discussed prescriptions and follow up appointments with the patient. Prescriptions given to patient, medication list explained in detail. Pt verbalized understanding.  Allergies as of 11/07/2018   No Known Allergies     Medication List    STOP taking these medications   baclofen 10 MG tablet Commonly known as:  LIORESAL   metoprolol tartrate 50 MG tablet Commonly known as:  LOPRESSOR     TAKE these medications   amLODipine 10 MG tablet Commonly known as:  NORVASC Take 1 tablet (10 mg total) by mouth at bedtime.   cloNIDine 0.1 MG tablet Commonly known as:  CATAPRES Take 1 tablet (0.1 mg total) by mouth 2 (two) times daily.   cyanocobalamin 2000 MCG tablet Take 1 tablet (2,000 mcg total) by mouth daily.   feeding supplement (NEPRO CARB STEADY) Liqd Take 237 mLs by mouth 2 (two) times daily between meals.   megestrol 625 MG/5ML suspension Commonly known as:  MEGACE ES Take 625 mg by mouth 3 (three) times daily with meals.   multivitamin Tabs tablet Take 1 tablet by mouth at bedtime.   pregabalin 25 MG capsule Commonly known as:  LYRICA Take 1 capsule (25 mg total) by mouth daily.   ranitidine 150 MG tablet Commonly known as:  ZANTAC Take 300 mg by mouth daily at 12 noon.   rOPINIRole 0.5 MG tablet Commonly known as:  REQUIP Take 1 tablet (0.5 mg total) by mouth daily.   sevelamer carbonate 800 MG tablet Commonly known as:  RENVELA Take 4 tablets (3,200 mg total) by mouth 3 (three) times daily with meals.   sildenafil 50 MG tablet Commonly known as:  VIAGRA Take 50 mg by mouth daily as needed for erectile dysfunction.   thiamine 100 MG tablet Take 1 tablet (100 mg total) by mouth daily.   UNABLE TO FIND BP monitor  Diagnosis: Hypertension   valACYclovir 500 MG tablet Commonly known as:  VALTREX Take 500 mg by mouth daily.       Vitals:   11/07/18 1030 11/07/18 1046  BP: (!) 145/89 (!) 149/95   Pulse: 93 91  Resp: 14 18  Temp: 98 F (36.7 C) 98.3 F (36.8 C)  SpO2: 100% 100%    Skin clean, dry and intact without evidence of skin break down, no evidence of skin tears noted. IV catheter discontinued intact. Site without signs and symptoms of complications. Dressing and pressure applied. Pt denies pain at this time. No complaints noted.  An After Visit Summary was printed and given to the patient. Patient escorted via [place], and D/C home via private auto.  Aneta Mins, RN

## 2018-11-07 NOTE — Care Management Note (Signed)
Case Management Note Manya Silvas, RN MSN CCM Transitions of Care [address] [place] [phone]  Patient Details  Name: Maurice Bell MRN: [id number] Date of Birth: 19-Dec-1955  Subjective/Objective:            Acute metabolic encepalopathy        Action/Plan: PTA home. 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 Drives himself to hemodialysis or appointments. No issues with medications. No HH history. PT recommended HHPT, offered choice-referral made to [facility]. No DME needs-has walker at home that he uses intermittently. Has PCP. Patient to transition home today. No other transition of care needs identified.   Expected Discharge Date:  11/07/18               Expected Discharge Plan:  [place]  In-House Referral:  NA  Discharge planning Services  CM Consult  Post Acute Care Choice:  NA Choice offered to:  Patient  DME Arranged:  N/A DME Agency:  NA  HH Arranged:  PT HH Agency:  [place]  Status of Service:  Completed, signed off  If discussed at [place] of Stay Meetings, dates discussed:    Additional Comments:  Bartholomew Crews, RN 11/07/2018, 1:26 PM

## 2018-11-07 NOTE — Discharge Summary (Signed)
Physician Discharge Summary  Maurice Bell EXB:[id number] DOB: [birth year] DOA: 11/02/2018  PCP: [name], PA-C  Admit date: 11/02/2018 Discharge date: 11/07/2018  Admitted From: Home Disposition:  Home  Recommendations for Outpatient Follow-up:  1. Follow up with PCP in 1 week 2. HD TTS  Discharge Condition: Stable CODE STATUS: Full  Diet recommendation: Renal   Brief/Interim Summary: Maurice Bell is a 63 yo male ESRD on hemodialysis TTS who presented to the emergency department after missed dialysis with jerking activity, acute encephalopathy.  He was found to be hyperkalemic, hypoglycemic, and hypertensive urgency with pulmonary edema.  He was admitted to the ICU, underwent CRRT.  He was intubated on 1/2, extubated 1/3.  Transferred to [facility] 1/4. On 1/6, he underwent tunneled catheter placement and patient was discharged home in stable condition.   ESRD on dialysis S/p tunneled dialysis catheter placement 1/6. Will follow up for permanent access as outpatient  Nephrology following   Acute metabolic encephalopathy with inability to protect airway Resolved, now extubated and remained stable  Acute hypoxemic respiratory failure secondary to pulmonary edema Resolved, now extubated and remains to be stable on room air  Anemia of chronic kidney disease Continue to monitor hemoglobin  Macrocytosis N02, folic acid wnl   HTN Resume home norvasc, clonidine   Discharge Instructions  Discharge Instructions    Call MD for:  difficulty breathing, headache or visual disturbances   Complete by:  As directed    Call MD for:  extreme fatigue   Complete by:  As directed    Call MD for:  persistant dizziness or light-headedness   Complete by:  As directed    Call MD for:  persistant nausea and vomiting   Complete by:  As directed    Call MD for:  redness, tenderness, or signs of infection (pain, swelling, redness, odor or green/yellow discharge around incision site)    Complete by:  As directed    Call MD for:  severe uncontrolled pain   Complete by:  As directed    Call MD for:  temperature >100.4   Complete by:  As directed    Discharge instructions   Complete by:  As directed    You were cared for by a hospitalist during your hospital stay. If you have any questions about your discharge medications or the care you received while you were in the hospital after you are discharged, you can call the unit and ask to speak with the hospitalist on call if the hospitalist that took care of you is not available. Once you are discharged, your primary care physician will handle any further medical issues. Please note that NO REFILLS for any discharge medications will be authorized once you are discharged, as it is imperative that you return to your primary care physician (or establish a relationship with a primary care physician if you do not have one) for your aftercare needs so that they can reassess your need for medications and monitor your lab values.   Increase activity slowly   Complete by:  As directed      Allergies as of 11/07/2018   No Known Allergies     Medication List    STOP taking these medications   baclofen 10 MG tablet Commonly known as:  LIORESAL   metoprolol tartrate 50 MG tablet Commonly known as:  LOPRESSOR     TAKE these medications   amLODipine 10 MG tablet Commonly known as:  NORVASC Take 1 tablet (10 mg total) by  mouth at bedtime.   cloNIDine 0.1 MG tablet Commonly known as:  CATAPRES Take 1 tablet (0.1 mg total) by mouth 2 (two) times daily.   cyanocobalamin 2000 MCG tablet Take 1 tablet (2,000 mcg total) by mouth daily.   feeding supplement (NEPRO CARB STEADY) Liqd Take 237 mLs by mouth 2 (two) times daily between meals.   megestrol 625 MG/5ML suspension Commonly known as:  MEGACE ES Take 625 mg by mouth 3 (three) times daily with meals.   multivitamin Tabs tablet Take 1 tablet by mouth at bedtime.   pregabalin 25  MG capsule Commonly known as:  LYRICA Take 1 capsule (25 mg total) by mouth daily.   ranitidine 150 MG tablet Commonly known as:  ZANTAC Take 300 mg by mouth daily at 12 noon.   rOPINIRole 0.5 MG tablet Commonly known as:  REQUIP Take 1 tablet (0.5 mg total) by mouth daily.   sevelamer carbonate 800 MG tablet Commonly known as:  RENVELA Take 4 tablets (3,200 mg total) by mouth 3 (three) times daily with meals.   sildenafil 50 MG tablet Commonly known as:  VIAGRA Take 50 mg by mouth daily as needed for erectile dysfunction.   thiamine 100 MG tablet Take 1 tablet (100 mg total) by mouth daily.   UNABLE TO FIND BP monitor  Diagnosis: Hypertension   valACYclovir 500 MG tablet Commonly known as:  VALTREX Take 500 mg by mouth daily.       No Known Allergies  Consultations:  PCCM  Vascular Surgery  Nephrology    Procedures/Studies: Dg Chest 2 View  Result Date: 10/27/2018 CLINICAL DATA:  Fall, shortness of breath, chest pain EXAM: CHEST - 2 VIEW COMPARISON:  07/08/2018 FINDINGS: Cardiomegaly. Lungs clear. No effusions or edema. No acute bony abnormality. IMPRESSION: Cardiomegaly.  No active disease. Electronically Signed   By: Rolm Baptise M.D.   On: 10/27/2018 14:07   Ct Head Wo Contrast  Result Date: 11/02/2018 CLINICAL DATA:  Status post continuous seizures. Acute onset of neck pain. Concern for head injury. Initial encounter. EXAM: CT HEAD WITHOUT CONTRAST CT CERVICAL SPINE WITHOUT CONTRAST TECHNIQUE: Multidetector CT imaging of the head and cervical spine was performed following the standard protocol without intravenous contrast. Multiplanar CT image reconstructions of the cervical spine were also generated. COMPARISON:  CT of the head performed 10/06/2018; CT of the cervical spine, and MRI of the brain and cervical spine performed 09/29/2018. FINDINGS: CT HEAD FINDINGS Brain: No evidence of acute infarction, hemorrhage, hydrocephalus, extra-axial collection or mass  lesion/mass effect. 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 The brainstem and fourth ventricle are within normal limits. The third and lateral ventricles, and basal ganglia are unremarkable in appearance. The cerebral hemispheres are symmetric in appearance, with normal gray-white differentiation. No mass effect or midline shift is seen. Vascular: No hyperdense vessel or unexpected calcification. Skull: There is no evidence of fracture; visualized osseous structures are unremarkable in appearance. Sinuses/Orbits: The orbits are within normal limits. A mucus retention cyst or polyp is noted at the right maxillary sinus. The remaining paranasal sinuses and mastoid air cells are well-aerated. Other: No significant soft tissue abnormalities are seen. CT CERVICAL SPINE FINDINGS Alignment: Normal. Skull base and vertebrae: No acute fracture. Diffuse endplate abnormalities and underlying bony sclerosis are stable from the prior CT. This is most prominent at C3-C4, as on the prior study. There is mild multilevel loss of height along much of the cervical spine. Soft tissues and spinal canal: No  prevertebral fluid or swelling. No visible canal hematoma. Disc levels: Multilevel disc space narrowing is noted, with diffuse endplate irregularity, particularly at C3-C4. Upper chest: The minimally visualized lung apices are clear. The visualized portions of the right thyroid lobe are grossly unremarkable in appearance. Other: No additional soft tissue abnormalities are seen. IMPRESSION: 1. No evidence of traumatic intracranial injury or fracture. 2. No evidence of fracture or subluxation along the cervical spine. 3. 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 4. Diffuse abnormality of the cervical spine is unchanged from November. 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 Osteomyelitis is considered less likely given  apparent stability from November. 5. Mucus retention cyst or polyp at the right maxillary sinus. Electronically Signed   By: Garald Balding M.D.   On: 11/02/2018 22:15   Ct Cervical Spine Wo Contrast  Result Date: 11/02/2018 CLINICAL DATA:  Status post continuous seizures. Acute onset of neck pain. Concern for head injury. Initial encounter. EXAM: CT HEAD WITHOUT CONTRAST CT CERVICAL SPINE WITHOUT CONTRAST TECHNIQUE: Multidetector CT imaging of the head and cervical spine was performed following the standard protocol without intravenous contrast. Multiplanar CT image reconstructions of the cervical spine were also generated. COMPARISON:  CT of the head performed 10/06/2018; CT of the cervical spine, and MRI of the brain and cervical spine performed 09/29/2018. FINDINGS: CT HEAD FINDINGS Brain: No evidence of acute infarction, hemorrhage, hydrocephalus, extra-axial collection or mass lesion/mass effect. 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 The brainstem and fourth ventricle are within normal limits. The third and lateral ventricles, and basal ganglia are unremarkable in appearance. The cerebral hemispheres are symmetric in appearance, with normal gray-white differentiation. No mass effect or midline shift is seen. Vascular: No hyperdense vessel or unexpected calcification. Skull: There is no evidence of fracture; visualized osseous structures are unremarkable in appearance. Sinuses/Orbits: The orbits are within normal limits. A mucus retention cyst or polyp is noted at the right maxillary sinus. The remaining paranasal sinuses and mastoid air cells are well-aerated. Other: No significant soft tissue abnormalities are seen. CT CERVICAL SPINE FINDINGS Alignment: Normal. Skull base and vertebrae: No acute fracture. Diffuse endplate abnormalities and underlying bony sclerosis are stable from the prior CT. This is most prominent at C3-C4, as on the prior study. There is mild multilevel  loss of height along much of the cervical spine. Soft tissues and spinal canal: No prevertebral fluid or swelling. No visible canal hematoma. Disc levels: Multilevel disc space narrowing is noted, with diffuse endplate irregularity, particularly at C3-C4. Upper chest: The minimally visualized lung apices are clear. The visualized portions of the right thyroid lobe are grossly unremarkable in appearance. Other: No additional soft tissue abnormalities are seen. IMPRESSION: 1. No evidence of traumatic intracranial injury or fracture. 2. No evidence of fracture or subluxation along the cervical spine. 3. 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 4. Diffuse abnormality of the cervical spine is unchanged from November. 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 Osteomyelitis is considered less likely given apparent stability from November. 5. Mucus retention cyst or polyp at the right maxillary sinus. Electronically Signed   By: Garald Balding M.D.   On: 11/02/2018 22:15   Dg Chest Port 1 View  Result Date: 11/07/2018 CLINICAL DATA:  Diatek placement EXAM: PORTABLE CHEST 1 VIEW COMPARISON:  11/03/2018 FINDINGS: Placement central venous line without pneumothorax. Tip in the distal SVC. Lungs are clear.  No acute osseous abnormality. IMPRESSION: Placement of central venous line without pneumothorax. Electronically Signed   By: Suzy Bouchard M.D.   On: 11/07/2018 10:43   Portable Chest X-ray  Result Date: 11/03/2018 CLINICAL DATA:  OG tube placement EXAM: PORTABLE CHEST 1 VIEW COMPARISON:  11/02/2018 FINDINGS: Endotracheal tube is 8 cm above the carina. NG tube enters the stomach. Heart is normal size. Lingular atelectasis. Right lung clear. No effusions or pneumothorax. No acute bony abnormality. IMPRESSION: Support devices as above. Lingular atelectasis. Electronically Signed   By: Rolm Baptise M.D.   On: 11/03/2018 12:04   Dg  Chest Port 1 View  Result Date: 11/02/2018 CLINICAL DATA:  Seizure, altered mental status EXAM: PORTABLE CHEST 1 VIEW COMPARISON:  10/27/2018 chest radiograph. FINDINGS: Stable cardiomediastinal silhouette with borderline mild cardiomegaly. No pneumothorax. No pleural effusion. Lungs appear clear, with no acute consolidative airspace disease and no pulmonary edema. IMPRESSION: Stable borderline mild cardiomegaly without pulmonary edema. No active pulmonary disease. Electronically Signed   By: Ilona Sorrel M.D.   On: 11/02/2018 21:56   Vas [place] Upper Ext Vein Mapping (pre-op Avf)  Result Date: 11/06/2018 UPPER EXTREMITY VEIN MAPPING  Indications: Pre-operative for dialysis access. History: Non working, Bilateral radiocephalic AV fistuals.  Performing Technologist: Sharion Dove RVS  Examination Guidelines: A complete evaluation includes B-mode imaging, spectral Doppler, color Doppler, and power Doppler as needed of all accessible portions of each vessel. Bilateral testing is considered an integral part of a complete examination. Limited examinations for reoccurring indications may be performed as noted. +-----------------+-------------+----------+--------------+ Right Cephalic   Diameter (cm)Depth (cm)   Findings    +-----------------+-------------+----------+--------------+ Prox upper arm                          not visualized +-----------------+-------------+----------+--------------+ Mid upper arm                           not visualized +-----------------+-------------+----------+--------------+ Dist upper arm       0.16        0.14                  +-----------------+-------------+----------+--------------+ Antecubital fossa                       not visualized +-----------------+-------------+----------+--------------+ Prox forearm                            not visualized +-----------------+-------------+----------+--------------+ Mid forearm                              not visualized +-----------------+-------------+----------+--------------+ Dist forearm                            not visualized +-----------------+-------------+----------+--------------+ Wrist                                   not visualized +-----------------+-------------+----------+--------------+ +-----------------+-------------+----------+---------+ Right Basilic    Diameter (cm)Depth (cm)Findings  +-----------------+-------------+----------+---------+ Mid upper arm        0.18        0.20             +-----------------+-------------+----------+---------+ Dist upper arm       0.32  0.49             +-----------------+-------------+----------+---------+ Antecubital fossa    0.32        0.29             +-----------------+-------------+----------+---------+ Prox forearm         0.36        0.22   branching +-----------------+-------------+----------+---------+ Mid forearm          0.31        0.23   branching +-----------------+-------------+----------+---------+ Distal forearm       0.24        0.19             +-----------------+-------------+----------+---------+ Wrist                0.17        0.20             +-----------------+-------------+----------+---------+ +-----------------+-------------+----------+--------------+ Left Cephalic    Diameter (cm)Depth (cm)   Findings    +-----------------+-------------+----------+--------------+ Prox upper arm                          not visualized +-----------------+-------------+----------+--------------+ Mid upper arm                           not visualized +-----------------+-------------+----------+--------------+ Dist upper arm                          not visualized +-----------------+-------------+----------+--------------+ Antecubital fossa                       not visualized +-----------------+-------------+----------+--------------+ Prox forearm                             not visualized +-----------------+-------------+----------+--------------+ Mid forearm                             not visualized +-----------------+-------------+----------+--------------+ Wrist                                   not visualized +-----------------+-------------+----------+--------------+ +-----------------+-------------+----------+--------------+ Left Basilic     Diameter (cm)Depth (cm)   Findings    +-----------------+-------------+----------+--------------+ Prox upper arm                          not visualized +-----------------+-------------+----------+--------------+ Mid upper arm        0.12        0.16                  +-----------------+-------------+----------+--------------+ Dist upper arm       0.19        0.19                  +-----------------+-------------+----------+--------------+ Antecubital fossa    0.16        0.18                  +-----------------+-------------+----------+--------------+ Prox forearm         0.19        0.14                  +-----------------+-------------+----------+--------------+ Mid forearm          0.18  0.13                  +-----------------+-------------+----------+--------------+ Wrist                0.12        0.21                  +-----------------+-------------+----------+--------------+ *See table(s) above for measurements and observations.  Diagnosing physician: Ruta Hinds MD Electronically signed by Ruta Hinds MD on 11/06/2018 at 12:28:43 PM.    Final        Discharge Exam: Vitals:   11/07/18 1030 11/07/18 1046  BP: (!) 145/89 (!) 149/95  Pulse: 93 91  Resp: 14 18  Temp: 98 F (36.7 C) 98.3 F (36.8 C)  SpO2: 100% 100%    General: Pt is alert, awake, not in acute distress Cardiovascular: RRR, S1/S2 +, no rubs, no gallops Respiratory: CTA bilaterally, no wheezing, no rhonchi Abdominal: Soft, NT, ND, bowel sounds + Extremities: no edema, no  cyanosis    The results of significant diagnostics from this hospitalization (including imaging, microbiology, ancillary and laboratory) are listed below for reference.     Microbiology: Recent Results (from the past 240 hour(s))  MRSA PCR Screening     Status: None   Collection Time: 11/03/18  4:12 AM  Result Value Ref Range Status   MRSA by PCR NEGATIVE NEGATIVE Final    Comment:        The GeneXpert MRSA Assay (FDA approved for NASAL specimens only), is one component of a comprehensive MRSA colonization surveillance program. It is not intended to diagnose MRSA infection nor to guide or monitor treatment for MRSA infections. Performed at [facility], [place] [address], [place], [place] [address]   Surgical pcr screen     Status: None   Collection Time: 11/06/18 10:55 PM  Result Value Ref Range Status   MRSA, PCR NEGATIVE NEGATIVE Final   Staphylococcus aureus NEGATIVE NEGATIVE Final    Comment: (NOTE) The Xpert SA Assay (FDA approved for NASAL specimens in patients 14 years of age and older), is one component of a comprehensive surveillance program. It is not intended to diagnose infection nor to guide or monitor treatment. Performed at [facility], [place] [address] [place], [place] [address]      Labs: BNP (last 3 results) No results for input(s): BNP in the last 8760 hours. Basic Metabolic Panel: Recent Labs  Lab 11/03/18 0252  11/04/18 0647 11/04/18 1555 11/05/18 1519 11/06/18 0429 11/07/18 0338  NA 145   < > 138 137 138 137 137  K 6.1*   < > 5.4* 5.0 4.6 3.8 4.2  CL 107   < > 98 99 99 98 94*  CO2 23   < > 24 23 24 27 28   GLUCOSE 85   < > 106* 100* 123* 102* 88  BUN 92*   < > 54* 61* 83* 34* 53*  CREATININE 17.86*   < > 12.99* 13.99* 17.23* 9.05* 12.13*  CALCIUM 9.3   < > 9.0 9.0 8.5* 8.6* 8.0*  PHOS 5.4*  --   --   --  6.6* 5.7*  --    < > = values in this interval not displayed.   Liver Function Tests: Recent Labs  Lab 11/02/18 2121  11/03/18 0252 11/04/18 0647 11/05/18 1519 11/06/18 0429  AST 23  --  29  --   --   ALT 10  --  25  --   --  ALKPHOS 57  --  45  --   --   BILITOT 0.3  --  0.5  --   --   PROT 7.0  --  6.4*  --   --   ALBUMIN 3.6 3.4* 3.2* 3.3* 3.2*   No results for input(s): LIPASE, AMYLASE in the last 168 hours. No results for input(s): AMMONIA in the last 168 hours. CBC: Recent Labs  Lab 11/02/18 2121  11/03/18 0252  11/03/18 1036 11/04/18 0647 11/05/18 1519 11/06/18 0429 11/07/18 0338  WBC 5.0  --  5.4  --   --  4.0 4.6 3.7* 3.9*  NEUTROABS 3.3  --   --   --   --   --   --   --   --   HGB 8.2*   < > 8.1*   < > 8.8* 7.5* 6.9* 7.9* 7.5*  HCT 26.4*   < > 25.4*   < > 26.0* 22.9* 21.9* 23.7* 23.3*  MCV 113.8*  --  113.4*  --   --  111.2* 113.5* 104.4* 105.9*  PLT 112*  --  108*  --   --  89* 84* 85* 111*   < > = values in this interval not displayed.   Cardiac Enzymes: No results for input(s): CKTOTAL, CKMB, CKMBINDEX, TROPONINI in the last 168 hours. BNP: Invalid input(s): POCBNP CBG: Recent Labs  Lab 11/04/18 0743 11/04/18 1538 11/04/18 1950 11/05/18 0225 11/05/18 0807  GLUCAP 118* 88 91 89 86   D-Dimer No results for input(s): DDIMER in the last 72 hours. Hgb A1c No results for input(s): HGBA1C in the last 72 hours. Lipid Profile No results for input(s): CHOL, HDL, LDLCALC, TRIG, CHOLHDL, LDLDIRECT in the last 72 hours. Thyroid function studies No results for input(s): TSH, T4TOTAL, T3FREE, THYROIDAB in the last 72 hours.  Invalid input(s): FREET3 Anemia work up Recent Labs    11/06/18 0429  VITAMINB12 680  FOLATE 17.7  FERRITIN 961*  TIBC 270  IRON 58  RETICCTPCT 1.6   Urinalysis No results found for: COLORURINE, APPEARANCEUR, [place], [place], [place], [place], [place], [place], PROTEINUR, UROBILINOGEN, NITRITE, LEUKOCYTESUR Sepsis Labs Invalid input(s): PROCALCITONIN,  WBC,  LACTICIDVEN Microbiology Recent Results (from the past 240 hour(s))  MRSA PCR  Screening     Status: None   Collection Time: 11/03/18  4:12 AM  Result Value Ref Range Status   MRSA by PCR NEGATIVE NEGATIVE Final    Comment:        The GeneXpert MRSA Assay (FDA approved for NASAL specimens only), is one component of a comprehensive MRSA colonization surveillance program. It is not intended to diagnose MRSA infection nor to guide or monitor treatment for MRSA infections. Performed at [facility], [place] [address], [place], [place] [address]   Surgical pcr screen     Status: None   Collection Time: 11/06/18 10:55 PM  Result Value Ref Range Status   MRSA, PCR NEGATIVE NEGATIVE Final   Staphylococcus aureus NEGATIVE NEGATIVE Final    Comment: (NOTE) The Xpert SA Assay (FDA approved for NASAL specimens in patients 36 years of age and older), is one component of a comprehensive surveillance program. It is not intended to diagnose infection nor to guide or monitor treatment. Performed at [facility], [place] [address] [place],  [address]      Patient was seen and examined on the day of discharge and was found to be in stable condition. Time coordinating discharge: 25 minutes including assessment and  coordination of care, as well as examination of the patient.   SIGNED:  Dessa Phi, DO [facility] Pager [phone]  If 7PM-7AM, please contact night-coverage [email or web address] Password [facility] 11/07/2018, 12:58 PM

## 2018-11-07 NOTE — Anesthesia Preprocedure Evaluation (Addendum)
Anesthesia Evaluation  Patient identified by MRN, date of birth, ID band Patient awake    Reviewed: Allergy & Precautions, NPO status , Patient's Chart, lab work & pertinent test results, reviewed documented beta blocker date and time   History of Anesthesia Complications Negative for: history of anesthetic complications  Airway Mallampati: II  TM Distance: >3 FB Neck ROM: Full    Dental  (+) Partial Lower, Dental Advisory Given   Pulmonary former smoker,    breath sounds clear to auscultation       Cardiovascular hypertension, Pt. on medications and Pt. on home beta blockers  Rhythm:Regular     Neuro/Psych PSYCHIATRIC DISORDERS Anxiety Depression CVA, No Residual Symptoms    GI/Hepatic negative GI ROS, (+) Hepatitis -, C  Endo/Other  negative endocrine ROS  Renal/GU ESRF and CRFRenal disease     Musculoskeletal   Abdominal   Peds  Hematology  (+) anemia ,   Anesthesia Other Findings   Reproductive/Obstetrics                           Anesthesia Physical Anesthesia Plan  ASA: III  Anesthesia Plan: MAC   Post-op Pain Management:    Induction: Intravenous  PONV Risk Score and Plan: 1 and Treatment may vary due to age or medical condition and Propofol infusion  Airway Management Planned: Nasal Cannula  Additional Equipment: None  Intra-op Plan:   Post-operative Plan:   Informed Consent: I have reviewed the patients History and Physical, chart, labs and discussed the procedure including the risks, benefits and alternatives for the proposed anesthesia with the patient or authorized representative who has indicated his/her understanding and acceptance.   Dental advisory given  Plan Discussed with: Surgeon, CRNA and Anesthesiologist  Anesthesia Plan Comments:        Anesthesia Quick Evaluation

## 2018-11-07 NOTE — Progress Notes (Signed)
OT Cancellation Note  Patient Details Name: Maurice Bell MRN: [id number] DOB: 16-Jul-1956   Cancelled Treatment:    Reason Eval/Treat Not Completed: Patient at procedure or test/ unavailable. Will return as schedule allows. Thank you.  [place], OTR/L Acute Rehab Pager: [phone] Office: [phone] 11/07/2018, 8:31 AM

## 2018-11-07 NOTE — Interval H&P Note (Signed)
History and Physical Interval Note:  11/07/2018 8:05 AM  Maurice Bell  has presented today for surgery, with the diagnosis of ESRD  The various methods of treatment have been discussed with the patient and family. After consideration of risks, benefits and other options for treatment, the patient has consented to  Procedure(s): INSERTION OF TUNNELED DIALYSIS CATHETER (N/A) as a surgical intervention .  The patient's history has been reviewed, patient examined, no change in status, stable for surgery.  I have reviewed the patient's chart and labs.  Questions were answered to the patient's satisfaction.     Deitra Mayo

## 2018-11-08 ENCOUNTER — Encounter (HOSPITAL_COMMUNITY): Payer: Self-pay | Admitting: Vascular Surgery

## 2018-11-08 ENCOUNTER — Telehealth: Payer: Self-pay | Admitting: Physician Assistant

## 2018-11-08 DIAGNOSIS — E875 Hyperkalemia: Secondary | ICD-10-CM | POA: Diagnosis not present

## 2018-11-08 DIAGNOSIS — D631 Anemia in chronic kidney disease: Secondary | ICD-10-CM | POA: Diagnosis not present

## 2018-11-08 DIAGNOSIS — N2581 Secondary hyperparathyroidism of renal origin: Secondary | ICD-10-CM | POA: Diagnosis not present

## 2018-11-08 DIAGNOSIS — N186 End stage renal disease: Secondary | ICD-10-CM | POA: Diagnosis not present

## 2018-11-08 NOTE — Telephone Encounter (Signed)
This is [facility] patient. Forward to [facility]

## 2018-11-08 NOTE — Anesthesia Postprocedure Evaluation (Signed)
Anesthesia Post Note  Patient: Maurice Bell  Procedure(s) Performed: INSERTION OF TUNNELED DIALYSIS CATHETER - LEFT INTERNAL JUGULAR PLACEMENT (Left Chest)     Patient location during evaluation: PACU Anesthesia Type: MAC Level of consciousness: awake and alert Pain management: pain level controlled Vital Signs Assessment: post-procedure vital signs reviewed and stable Respiratory status: spontaneous breathing, nonlabored ventilation, respiratory function stable and patient connected to nasal cannula oxygen Cardiovascular status: stable and blood pressure returned to baseline Postop Assessment: no apparent nausea or vomiting Anesthetic complications: no    Last Vitals:  Vitals:   11/07/18 1030 11/07/18 1046  BP: (!) 145/89 (!) 149/95  Pulse: 93 91  Resp: 14 18  Temp: 36.7 C 36.8 C  SpO2: 100% 100%    Last Pain:  Vitals:   11/07/18 1216  TempSrc:   PainSc: 0-No pain                 Adnan Vanvoorhis

## 2018-11-08 NOTE — Telephone Encounter (Signed)
Lemuel called with [facility] to get verbal orders to begin PT visits tomorrow due to the patient having dialysis today. Please follow up.

## 2018-11-09 ENCOUNTER — Encounter (HOSPITAL_COMMUNITY): Payer: Self-pay

## 2018-11-09 ENCOUNTER — Ambulatory Visit (HOSPITAL_COMMUNITY): Admit: 2018-11-09 | Payer: Medicare Other | Admitting: Vascular Surgery

## 2018-11-09 SURGERY — CENTRAL LINE INSERTION
Anesthesia: LOCAL

## 2018-11-10 ENCOUNTER — Telehealth (INDEPENDENT_AMBULATORY_CARE_PROVIDER_SITE_OTHER): Payer: Self-pay | Admitting: Physician Assistant

## 2018-11-10 DIAGNOSIS — N2581 Secondary hyperparathyroidism of renal origin: Secondary | ICD-10-CM | POA: Diagnosis not present

## 2018-11-10 DIAGNOSIS — D631 Anemia in chronic kidney disease: Secondary | ICD-10-CM | POA: Diagnosis not present

## 2018-11-10 DIAGNOSIS — E875 Hyperkalemia: Secondary | ICD-10-CM | POA: Diagnosis not present

## 2018-11-10 DIAGNOSIS — N186 End stage renal disease: Secondary | ICD-10-CM | POA: Diagnosis not present

## 2018-11-10 NOTE — Telephone Encounter (Signed)
[facility] called to inform that he went to do a physical therapy evaluation on the patient and states that the patient is walking without support of any device. The patient is also driving and states that he is back to normal. Therapist states that there is no need for physical therapy or skill nursing since patient is walking without complications and driving.  Skeet Simmer if need to be reached [phone]  Thank you Emmit Pomfret

## 2018-11-10 NOTE — Telephone Encounter (Signed)
FWD to PCP. Jancarlos Thrun S Crystelle Ferrufino, CMA  

## 2018-11-11 NOTE — Telephone Encounter (Signed)
Noted  

## 2018-11-12 DIAGNOSIS — D631 Anemia in chronic kidney disease: Secondary | ICD-10-CM | POA: Diagnosis not present

## 2018-11-12 DIAGNOSIS — N186 End stage renal disease: Secondary | ICD-10-CM | POA: Diagnosis not present

## 2018-11-12 DIAGNOSIS — N2581 Secondary hyperparathyroidism of renal origin: Secondary | ICD-10-CM | POA: Diagnosis not present

## 2018-11-12 DIAGNOSIS — E875 Hyperkalemia: Secondary | ICD-10-CM | POA: Diagnosis not present

## 2018-11-15 DIAGNOSIS — N2581 Secondary hyperparathyroidism of renal origin: Secondary | ICD-10-CM | POA: Diagnosis not present

## 2018-11-15 DIAGNOSIS — E875 Hyperkalemia: Secondary | ICD-10-CM | POA: Diagnosis not present

## 2018-11-15 DIAGNOSIS — D631 Anemia in chronic kidney disease: Secondary | ICD-10-CM | POA: Diagnosis not present

## 2018-11-15 DIAGNOSIS — N186 End stage renal disease: Secondary | ICD-10-CM | POA: Diagnosis not present

## 2018-11-16 ENCOUNTER — Encounter (INDEPENDENT_AMBULATORY_CARE_PROVIDER_SITE_OTHER): Payer: Self-pay | Admitting: Orthopaedic Surgery

## 2018-11-16 ENCOUNTER — Ambulatory Visit (INDEPENDENT_AMBULATORY_CARE_PROVIDER_SITE_OTHER): Payer: Medicare Other | Admitting: Orthopaedic Surgery

## 2018-11-16 VITALS — BP 171/94 | HR 94 | Temp 97.6°F | Ht 65.0 in | Wt 128.6 lb

## 2018-11-16 DIAGNOSIS — M4722 Other spondylosis with radiculopathy, cervical region: Secondary | ICD-10-CM | POA: Diagnosis not present
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
## 2018-11-17 ENCOUNTER — Encounter (INDEPENDENT_AMBULATORY_CARE_PROVIDER_SITE_OTHER): Payer: Self-pay | Admitting: Orthopaedic Surgery

## 2018-11-17 DIAGNOSIS — N2581 Secondary hyperparathyroidism of renal origin: Secondary | ICD-10-CM | POA: Diagnosis not present

## 2018-11-17 DIAGNOSIS — D631 Anemia in chronic kidney disease: Secondary | ICD-10-CM | POA: Diagnosis not present

## 2018-11-17 DIAGNOSIS — N186 End stage renal disease: Secondary | ICD-10-CM | POA: Diagnosis not present

## 2018-11-17 DIAGNOSIS — E875 Hyperkalemia: Secondary | ICD-10-CM | POA: Diagnosis not present

## 2018-11-17 DIAGNOSIS — M4722 Other spondylosis with radiculopathy, cervical region: Secondary | ICD-10-CM | POA: Insufficient documentation

## 2018-11-19 DIAGNOSIS — E875 Hyperkalemia: Secondary | ICD-10-CM | POA: Diagnosis not present

## 2018-11-19 DIAGNOSIS — D631 Anemia in chronic kidney disease: Secondary | ICD-10-CM | POA: Diagnosis not present

## 2018-11-19 DIAGNOSIS — N2581 Secondary hyperparathyroidism of renal origin: Secondary | ICD-10-CM | POA: Diagnosis not present

## 2018-11-19 DIAGNOSIS — N186 End stage renal disease: Secondary | ICD-10-CM | POA: Diagnosis not present

## 2018-11-22 DIAGNOSIS — N186 End stage renal disease: Secondary | ICD-10-CM | POA: Diagnosis not present

## 2018-11-22 DIAGNOSIS — D631 Anemia in chronic kidney disease: Secondary | ICD-10-CM | POA: Diagnosis not present

## 2018-11-22 DIAGNOSIS — N2581 Secondary hyperparathyroidism of renal origin: Secondary | ICD-10-CM | POA: Diagnosis not present

## 2018-11-22 DIAGNOSIS — E875 Hyperkalemia: Secondary | ICD-10-CM | POA: Diagnosis not present

## 2018-11-24 DIAGNOSIS — E875 Hyperkalemia: Secondary | ICD-10-CM | POA: Diagnosis not present

## 2018-11-24 DIAGNOSIS — D631 Anemia in chronic kidney disease: Secondary | ICD-10-CM | POA: Diagnosis not present

## 2018-11-24 DIAGNOSIS — N186 End stage renal disease: Secondary | ICD-10-CM | POA: Diagnosis not present

## 2018-11-24 DIAGNOSIS — N2581 Secondary hyperparathyroidism of renal origin: Secondary | ICD-10-CM | POA: Diagnosis not present

## 2018-11-26 DIAGNOSIS — E875 Hyperkalemia: Secondary | ICD-10-CM | POA: Diagnosis not present

## 2018-11-26 DIAGNOSIS — D631 Anemia in chronic kidney disease: Secondary | ICD-10-CM | POA: Diagnosis not present

## 2018-11-26 DIAGNOSIS — N186 End stage renal disease: Secondary | ICD-10-CM | POA: Diagnosis not present

## 2018-11-26 DIAGNOSIS — N2581 Secondary hyperparathyroidism of renal origin: Secondary | ICD-10-CM | POA: Diagnosis not present

## 2018-11-27 ENCOUNTER — Inpatient Hospital Stay (HOSPITAL_COMMUNITY)
Admission: EM | Admit: 2018-11-27 | Discharge: 2018-11-30 | DRG: 682 | Discharge status: Against Medical Advice | Payer: Medicare Other | Attending: Internal Medicine | Admitting: Internal Medicine

## 2018-11-27 ENCOUNTER — Emergency Department (HOSPITAL_COMMUNITY): Payer: Medicare Other

## 2018-11-27 ENCOUNTER — Other Ambulatory Visit: Payer: Self-pay

## 2018-11-27 ENCOUNTER — Encounter (HOSPITAL_COMMUNITY): Payer: Self-pay

## 2018-11-27 DIAGNOSIS — R4182 Altered mental status, unspecified: Secondary | ICD-10-CM

## 2018-11-27 DIAGNOSIS — B1920 Unspecified viral hepatitis C without hepatic coma: Secondary | ICD-10-CM | POA: Diagnosis present

## 2018-11-27 DIAGNOSIS — D638 Anemia in other chronic diseases classified elsewhere: Secondary | ICD-10-CM | POA: Diagnosis not present

## 2018-11-27 DIAGNOSIS — I69398 Other sequelae of cerebral infarction: Secondary | ICD-10-CM

## 2018-11-27 DIAGNOSIS — G9341 Metabolic encephalopathy: Secondary | ICD-10-CM | POA: Diagnosis present

## 2018-11-27 DIAGNOSIS — R569 Unspecified convulsions: Secondary | ICD-10-CM | POA: Diagnosis present

## 2018-11-27 DIAGNOSIS — F329 Major depressive disorder, single episode, unspecified: Secondary | ICD-10-CM | POA: Diagnosis present

## 2018-11-27 DIAGNOSIS — I639 Cerebral infarction, unspecified: Secondary | ICD-10-CM | POA: Diagnosis present

## 2018-11-27 DIAGNOSIS — D61818 Other pancytopenia: Secondary | ICD-10-CM | POA: Diagnosis not present

## 2018-11-27 DIAGNOSIS — Z9115 Patient's noncompliance with renal dialysis: Secondary | ICD-10-CM

## 2018-11-27 DIAGNOSIS — N19 Unspecified kidney failure: Secondary | ICD-10-CM | POA: Diagnosis not present

## 2018-11-27 DIAGNOSIS — R531 Weakness: Secondary | ICD-10-CM | POA: Diagnosis present

## 2018-11-27 DIAGNOSIS — D7589 Other specified diseases of blood and blood-forming organs: Secondary | ICD-10-CM | POA: Diagnosis present

## 2018-11-27 DIAGNOSIS — Z79899 Other long term (current) drug therapy: Secondary | ICD-10-CM

## 2018-11-27 DIAGNOSIS — E875 Hyperkalemia: Secondary | ICD-10-CM | POA: Diagnosis not present

## 2018-11-27 DIAGNOSIS — I120 Hypertensive chronic kidney disease with stage 5 chronic kidney disease or end stage renal disease: Secondary | ICD-10-CM | POA: Diagnosis not present

## 2018-11-27 DIAGNOSIS — R9431 Abnormal electrocardiogram [ECG] [EKG]: Secondary | ICD-10-CM | POA: Diagnosis not present

## 2018-11-27 DIAGNOSIS — Z87891 Personal history of nicotine dependence: Secondary | ICD-10-CM

## 2018-11-27 DIAGNOSIS — F32A Depression, unspecified: Secondary | ICD-10-CM | POA: Diagnosis present

## 2018-11-27 DIAGNOSIS — D631 Anemia in chronic kidney disease: Secondary | ICD-10-CM | POA: Diagnosis present

## 2018-11-27 DIAGNOSIS — I517 Cardiomegaly: Secondary | ICD-10-CM | POA: Diagnosis not present

## 2018-11-27 DIAGNOSIS — I10 Essential (primary) hypertension: Secondary | ICD-10-CM | POA: Diagnosis not present

## 2018-11-27 DIAGNOSIS — I248 Other forms of acute ischemic heart disease: Secondary | ICD-10-CM | POA: Diagnosis not present

## 2018-11-27 DIAGNOSIS — K219 Gastro-esophageal reflux disease without esophagitis: Secondary | ICD-10-CM | POA: Diagnosis present

## 2018-11-27 DIAGNOSIS — G253 Myoclonus: Secondary | ICD-10-CM | POA: Diagnosis present

## 2018-11-27 DIAGNOSIS — N186 End stage renal disease: Secondary | ICD-10-CM

## 2018-11-27 DIAGNOSIS — R4020 Unspecified coma: Secondary | ICD-10-CM | POA: Diagnosis not present

## 2018-11-27 DIAGNOSIS — Z992 Dependence on renal dialysis: Secondary | ICD-10-CM | POA: Diagnosis not present

## 2018-11-27 DIAGNOSIS — R0902 Hypoxemia: Secondary | ICD-10-CM | POA: Diagnosis not present

## 2018-11-27 DIAGNOSIS — I160 Hypertensive urgency: Secondary | ICD-10-CM | POA: Diagnosis present

## 2018-11-27 DIAGNOSIS — I499 Cardiac arrhythmia, unspecified: Secondary | ICD-10-CM | POA: Diagnosis not present

## 2018-11-27 DIAGNOSIS — R410 Disorientation, unspecified: Secondary | ICD-10-CM | POA: Diagnosis not present

## 2018-11-27 DIAGNOSIS — E162 Hypoglycemia, unspecified: Secondary | ICD-10-CM | POA: Diagnosis present

## 2018-11-27 LAB — CBC WITH DIFFERENTIAL/PLATELET
Abs Immature Granulocytes: 0.02 10*3/uL (ref 0.00–0.07)
Basophils Absolute: 0 10*3/uL (ref 0.0–0.1)
Basophils Relative: 1 %
EOS ABS: 0.1 10*3/uL (ref 0.0–0.5)
Eosinophils Relative: 3 %
HEMATOCRIT: 39.4 % (ref 39.0–52.0)
Hemoglobin: 11.8 g/dL — ABNORMAL LOW (ref 13.0–17.0)
Immature Granulocytes: 1 %
LYMPHS ABS: 0.9 10*3/uL (ref 0.7–4.0)
Lymphocytes Relative: 26 %
MCH: 35.5 pg — ABNORMAL HIGH (ref 26.0–34.0)
MCHC: 29.9 g/dL — ABNORMAL LOW (ref 30.0–36.0)
MCV: 118.7 fL — ABNORMAL HIGH (ref 80.0–100.0)
Monocytes Absolute: 0.5 10*3/uL (ref 0.1–1.0)
Monocytes Relative: 15 %
Neutro Abs: 1.9 10*3/uL (ref 1.7–7.7)
Neutrophils Relative %: 54 %
Platelets: 121 10*3/uL — ABNORMAL LOW (ref 150–400)
RBC: 3.32 MIL/uL — ABNORMAL LOW (ref 4.22–5.81)
RDW: 20.2 % — ABNORMAL HIGH (ref 11.5–15.5)
WBC: 3.5 10*3/uL — ABNORMAL LOW (ref 4.0–10.5)
nRBC: 0 % (ref 0.0–0.2)

## 2018-11-27 LAB — AMMONIA: Ammonia: 27 umol/L (ref 9–35)

## 2018-11-27 LAB — COMPREHENSIVE METABOLIC PANEL
ALT: 33 U/L (ref 0–44)
AST: 44 U/L — ABNORMAL HIGH (ref 15–41)
Albumin: 4 g/dL (ref 3.5–5.0)
Alkaline Phosphatase: 88 U/L (ref 38–126)
Anion gap: 15 (ref 5–15)
BUN: 37 mg/dL — ABNORMAL HIGH (ref 6–20)
CO2: 23 mmol/L (ref 22–32)
Calcium: 8.9 mg/dL (ref 8.9–10.3)
Chloride: 103 mmol/L (ref 98–111)
Creatinine, Ser: 9.72 mg/dL — ABNORMAL HIGH (ref 0.61–1.24)
GFR calc non Af Amer: 5 mL/min — ABNORMAL LOW (ref >60)
GFR, EST AFRICAN AMERICAN: 6 mL/min — AB (ref >60)
Glucose, Bld: 79 mg/dL (ref 70–99)
Potassium: 6.1 mmol/L — ABNORMAL HIGH (ref 3.5–5.1)
Sodium: 141 mmol/L (ref 135–145)
Total Bilirubin: 1.2 mg/dL (ref 0.3–1.2)
Total Protein: 7.7 g/dL (ref 6.5–8.1)

## 2018-11-27 LAB — BLOOD GAS, VENOUS
Acid-Base Excess: 2.6 mmol/L — ABNORMAL HIGH (ref 0.0–2.0)
Bicarbonate: 27.6 mmol/L (ref 20.0–28.0)
FIO2: 21
O2 SAT: 67.4 %
Patient temperature: 98.6
pCO2, Ven: 46.9 mmHg (ref 44.0–60.0)
pH, Ven: 7.388 (ref 7.250–7.430)
pO2, Ven: 39.4 mmHg (ref 32.0–45.0)

## 2018-11-27 LAB — CBG MONITORING, ED
Glucose-Capillary: 67 mg/dL — ABNORMAL LOW (ref 70–99)
Glucose-Capillary: 141 mg/dL — ABNORMAL HIGH (ref 70–99)

## 2018-11-27 LAB — ETHANOL: Alcohol, Ethyl (B): <10 mg/dL (ref <10)

## 2018-11-27 MED ORDER — LORAZEPAM 2 MG/ML IJ SOLN
1.0000 mg | INTRAMUSCULAR | Status: DC | PRN
  Filled 2018-11-27: qty 1

## 2018-11-27 MED ORDER — SODIUM POLYSTYRENE SULFONATE 15 GM/60ML PO SUSP: 30.0000 g | Freq: Once | ORAL | Status: DC

## 2018-11-27 MED ORDER — CHLORHEXIDINE GLUCONATE CLOTH 2 % EX PADS
6.0000 | MEDICATED_PAD | Freq: Every day | CUTANEOUS | Status: DC
  Administered 2018-11-29 – 2018-11-30 (×2): 6 via TOPICAL

## 2018-11-27 MED ORDER — FAMOTIDINE IN NACL 20-0.9 MG/50ML-% IV SOLN
20.0000 mg | Freq: Two times a day (BID) | INTRAVENOUS | Status: DC
  Administered 2018-11-28 (×2): 20 mg via INTRAVENOUS
  Filled 2018-11-27 (×2): qty 50

## 2018-11-27 MED ORDER — ACETAMINOPHEN 325 MG PO TABS
650.0000 mg | ORAL_TABLET | Freq: Four times a day (QID) | ORAL | Status: DC | PRN
  Filled 2018-11-27: qty 2

## 2018-11-27 MED ORDER — HEPARIN SODIUM (PORCINE) 5000 UNIT/ML IJ SOLN
5000.0000 [IU] | Freq: Three times a day (TID) | INTRAMUSCULAR | Status: DC
  Administered 2018-11-28 – 2018-11-30 (×6): 5000 [IU] via SUBCUTANEOUS
  Filled 2018-11-27 (×7): qty 1

## 2018-11-27 MED ORDER — HYDRALAZINE HCL 20 MG/ML IJ SOLN
5.0000 mg | INTRAMUSCULAR | Status: DC | PRN
  Administered 2018-11-28 (×4): 5 mg via INTRAVENOUS
  Filled 2018-11-27 (×3): qty 1

## 2018-11-27 MED ORDER — DEXTROSE 50 % IV SOLN
50.0000 mL | Freq: Once | INTRAVENOUS | Status: AC
  Administered 2018-11-27: 50 mL via INTRAVENOUS
  Filled 2018-11-27: qty 50

## 2018-11-27 MED ORDER — DEXTROSE 50 % IV SOLN
50.0000 mL | INTRAVENOUS | Status: DC | PRN
  Administered 2018-11-28: 50 mL via INTRAVENOUS
  Filled 2018-11-27: qty 50

## 2018-11-27 MED ORDER — CLONIDINE HCL 0.2 MG/24HR TD PTWK
0.2000 mg | MEDICATED_PATCH | TRANSDERMAL | Status: DC
  Administered 2018-11-28: 0.2 mg via TRANSDERMAL
  Filled 2018-11-27: qty 1

## 2018-11-27 MED ORDER — ONDANSETRON HCL 4 MG/2ML IJ SOLN: 4.0000 mg | Freq: Three times a day (TID) | INTRAMUSCULAR | Status: DC | PRN

## 2018-11-27 MED ORDER — CALCIUM GLUCONATE-NACL 1-0.675 GM/50ML-% IV SOLN
1.0000 g | Freq: Once | INTRAVENOUS | Status: AC
  Administered 2018-11-27: 1000 mg via INTRAVENOUS
  Filled 2018-11-27: qty 50

## 2018-11-27 MED ORDER — SODIUM POLYSTYRENE SULFONATE 15 GM/60ML PO SUSP: 45.0000 g | Freq: Once | ORAL | Status: DC
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
## 2018-11-27 NOTE — ED Triage Notes (Signed)
Pt brought by [facility] due to "continuous seizure for the past hour" stated and witnessed by family members. Pt is currently going through dialysis. Per EMS, pt is A&O x3, which is baseline for pt. Pt refused to go to [facility].

## 2018-11-27 NOTE — ED Notes (Signed)
Bed: QJ40 Expected date:  Expected time:  Means of arrival:  Comments: EMS 63 yo male dialysis patient syncope
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
## 2018-11-27 NOTE — ED Notes (Signed)
Pt transported to CT ?

## 2018-11-27 NOTE — ED Notes (Signed)
Pt back in room from CT 

## 2018-11-27 NOTE — ED Notes (Signed)
Patient remains somnolent-snoring resp-son states Father sleeps like this at home-restless, snoring,moaning and flailing of arms and legs in sleep.

## 2018-11-27 NOTE — ED Notes (Signed)
Patient is somnolent-will awake with jerking movements of arms and legs with tactile stimulation-will not open eyes when name called

## 2018-11-27 NOTE — ED Notes (Signed)
Contact is patient's son Hakiem Malizia [phone]

## 2018-11-27 NOTE — ED Notes (Signed)
Variable RR 7-15 with intermittent snoring

## 2018-11-28 ENCOUNTER — Ambulatory Visit: Payer: Medicare Other | Admitting: Physical Therapy

## 2018-11-28 DIAGNOSIS — D638 Anemia in other chronic diseases classified elsewhere: Secondary | ICD-10-CM | POA: Diagnosis not present

## 2018-11-28 DIAGNOSIS — I248 Other forms of acute ischemic heart disease: Secondary | ICD-10-CM | POA: Diagnosis present

## 2018-11-28 DIAGNOSIS — N19 Unspecified kidney failure: Secondary | ICD-10-CM | POA: Diagnosis not present

## 2018-11-28 DIAGNOSIS — I120 Hypertensive chronic kidney disease with stage 5 chronic kidney disease or end stage renal disease: Secondary | ICD-10-CM | POA: Diagnosis present

## 2018-11-28 DIAGNOSIS — E162 Hypoglycemia, unspecified: Secondary | ICD-10-CM | POA: Diagnosis present

## 2018-11-28 DIAGNOSIS — I69398 Other sequelae of cerebral infarction: Secondary | ICD-10-CM | POA: Diagnosis not present

## 2018-11-28 DIAGNOSIS — I160 Hypertensive urgency: Secondary | ICD-10-CM | POA: Diagnosis present

## 2018-11-28 DIAGNOSIS — E875 Hyperkalemia: Secondary | ICD-10-CM | POA: Diagnosis present

## 2018-11-28 DIAGNOSIS — Z9115 Patient's noncompliance with renal dialysis: Secondary | ICD-10-CM | POA: Diagnosis not present

## 2018-11-28 DIAGNOSIS — B1920 Unspecified viral hepatitis C without hepatic coma: Secondary | ICD-10-CM | POA: Diagnosis present

## 2018-11-28 DIAGNOSIS — R531 Weakness: Secondary | ICD-10-CM | POA: Diagnosis present

## 2018-11-28 DIAGNOSIS — D7589 Other specified diseases of blood and blood-forming organs: Secondary | ICD-10-CM | POA: Diagnosis present

## 2018-11-28 DIAGNOSIS — K219 Gastro-esophageal reflux disease without esophagitis: Secondary | ICD-10-CM | POA: Diagnosis present

## 2018-11-28 DIAGNOSIS — Z79899 Other long term (current) drug therapy: Secondary | ICD-10-CM | POA: Diagnosis not present

## 2018-11-28 DIAGNOSIS — D61818 Other pancytopenia: Secondary | ICD-10-CM | POA: Diagnosis present

## 2018-11-28 DIAGNOSIS — Z992 Dependence on renal dialysis: Secondary | ICD-10-CM | POA: Diagnosis not present

## 2018-11-28 DIAGNOSIS — Z87891 Personal history of nicotine dependence: Secondary | ICD-10-CM | POA: Diagnosis not present

## 2018-11-28 DIAGNOSIS — R569 Unspecified convulsions: Secondary | ICD-10-CM | POA: Diagnosis present

## 2018-11-28 DIAGNOSIS — G9341 Metabolic encephalopathy: Secondary | ICD-10-CM | POA: Diagnosis present

## 2018-11-28 DIAGNOSIS — N186 End stage renal disease: Secondary | ICD-10-CM | POA: Diagnosis present

## 2018-11-28 DIAGNOSIS — D631 Anemia in chronic kidney disease: Secondary | ICD-10-CM | POA: Diagnosis present

## 2018-11-28 DIAGNOSIS — F329 Major depressive disorder, single episode, unspecified: Secondary | ICD-10-CM | POA: Diagnosis present

## 2018-11-28 LAB — GLUCOSE, CAPILLARY
Glucose-Capillary: 85 mg/dL (ref 70–99)
Glucose-Capillary: 90 mg/dL (ref 70–99)
Glucose-Capillary: 102 mg/dL — ABNORMAL HIGH (ref 70–99)
Glucose-Capillary: 94 mg/dL (ref 70–99)

## 2018-11-28 LAB — BASIC METABOLIC PANEL
Anion gap: 14 (ref 5–15)
BUN: 41 mg/dL — ABNORMAL HIGH (ref 6–20)
CO2: 24 mmol/L (ref 22–32)
Calcium: 8.5 mg/dL — ABNORMAL LOW (ref 8.9–10.3)
Chloride: 104 mmol/L (ref 98–111)
Creatinine, Ser: 10.54 mg/dL — ABNORMAL HIGH (ref 0.61–1.24)
GFR calc Af Amer: 5 mL/min — ABNORMAL LOW (ref >60)
GFR calc non Af Amer: 5 mL/min — ABNORMAL LOW (ref >60)
Glucose, Bld: 84 mg/dL (ref 70–99)
Potassium: 4.7 mmol/L (ref 3.5–5.1)
Sodium: 142 mmol/L (ref 135–145)

## 2018-11-28 LAB — CBC
HCT: 36 % — ABNORMAL LOW (ref 39.0–52.0)
HEMOGLOBIN: 11.2 g/dL — AB (ref 13.0–17.0)
MCH: 35.7 pg — ABNORMAL HIGH (ref 26.0–34.0)
MCHC: 31.1 g/dL (ref 30.0–36.0)
MCV: 114.6 fL — ABNORMAL HIGH (ref 80.0–100.0)
Platelets: 124 10*3/uL — ABNORMAL LOW (ref 150–400)
RBC: 3.14 MIL/uL — ABNORMAL LOW (ref 4.22–5.81)
RDW: 19.6 % — ABNORMAL HIGH (ref 11.5–15.5)
WBC: 3.6 10*3/uL — ABNORMAL LOW (ref 4.0–10.5)
nRBC: 0 % (ref 0.0–0.2)

## 2018-11-28 LAB — TROPONIN I
Troponin I: 0.03 ng/mL (ref <0.03)
Troponin I: 0.03 ng/mL (ref <0.03)

## 2018-11-28 LAB — CBG MONITORING, ED
GLUCOSE-CAPILLARY: 65 mg/dL — AB (ref 70–99)
Glucose-Capillary: 118 mg/dL — ABNORMAL HIGH (ref 70–99)
Glucose-Capillary: 74 mg/dL (ref 70–99)
Glucose-Capillary: 77 mg/dL (ref 70–99)
Glucose-Capillary: 86 mg/dL (ref 70–99)
Glucose-Capillary: 93 mg/dL (ref 70–99)

## 2018-11-28 MED ORDER — HYDRALAZINE HCL 20 MG/ML IJ SOLN: 20.0000 mg | Freq: Once | INTRAMUSCULAR | Status: DC

## 2018-11-28 MED ORDER — LABETALOL HCL 5 MG/ML IV SOLN: 10.0000 mg | Freq: Once | INTRAVENOUS | Status: DC

## 2018-11-28 MED ORDER — HYDRALAZINE HCL 20 MG/ML IJ SOLN
10.0000 mg | Freq: Once | INTRAMUSCULAR | Status: AC
  Administered 2018-11-28: 10 mg via INTRAVENOUS

## 2018-11-28 MED ORDER — LIDOCAINE-PRILOCAINE 2.5-2.5 % EX CREA: 1.0000 "application " | TOPICAL_CREAM | CUTANEOUS | Status: DC | PRN

## 2018-11-28 MED ORDER — LABETALOL HCL 5 MG/ML IV SOLN
10.0000 mg | Freq: Once | INTRAVENOUS | Status: AC
  Administered 2018-11-28: 10 mg via INTRAVENOUS
  Filled 2018-11-28: qty 4

## 2018-11-28 MED ORDER — ALTEPLASE 2 MG IJ SOLR: 2.0000 mg | Freq: Once | INTRAMUSCULAR | Status: DC | PRN

## 2018-11-28 MED ORDER — HEPARIN SODIUM (PORCINE) 1000 UNIT/ML IJ SOLN
INTRAMUSCULAR | Status: AC
  Administered 2018-11-28: 3800 [IU] via INTRAVENOUS_CENTRAL
  Filled 2018-11-28: qty 4

## 2018-11-28 MED ORDER — CLONIDINE HCL 0.2 MG PO TABS: 0.2000 mg | ORAL_TABLET | Freq: Once | ORAL | Status: DC

## 2018-11-28 MED ORDER — LIDOCAINE HCL (PF) 1 % IJ SOLN: 5.0000 mL | INTRAMUSCULAR | Status: DC | PRN

## 2018-11-28 MED ORDER — SODIUM CHLORIDE 0.9 % IV SOLN: 100.0000 mL | INTRAVENOUS | Status: DC | PRN

## 2018-11-28 MED ORDER — HALOPERIDOL LACTATE 5 MG/ML IJ SOLN
1.0000 mg | Freq: Once | INTRAMUSCULAR | Status: AC
  Administered 2018-11-28: 1 mg via INTRAVENOUS
  Filled 2018-11-28: qty 1

## 2018-11-28 MED ORDER — LORAZEPAM 2 MG/ML IJ SOLN
1.0000 mg | Freq: Once | INTRAMUSCULAR | Status: AC
  Administered 2018-11-28: 1 mg via INTRAVENOUS

## 2018-11-28 MED ORDER — FAMOTIDINE IN NACL 20-0.9 MG/50ML-% IV SOLN
20.0000 mg | INTRAVENOUS | Status: DC
  Administered 2018-11-29: 20 mg via INTRAVENOUS
  Filled 2018-11-28: qty 50

## 2018-11-28 MED ORDER — HYDRALAZINE HCL 20 MG/ML IJ SOLN: 10.0000 mg | Freq: Once | INTRAMUSCULAR | Status: DC

## 2018-11-28 MED ORDER — HEPARIN SODIUM (PORCINE) 1000 UNIT/ML DIALYSIS
1000.0000 [IU] | INTRAMUSCULAR | Status: DC | PRN
  Administered 2018-11-28: 3800 [IU] via INTRAVENOUS_CENTRAL
  Filled 2018-11-28: qty 1

## 2018-11-28 MED ORDER — HYDRALAZINE HCL 20 MG/ML IJ SOLN
INTRAMUSCULAR | Status: AC
  Filled 2018-11-28: qty 1

## 2018-11-28 MED ORDER — PENTAFLUOROPROP-TETRAFLUOROETH EX AERO: 1.0000 "application " | INHALATION_SPRAY | CUTANEOUS | Status: DC | PRN

## 2018-11-28 MED ORDER — HYDRALAZINE HCL 20 MG/ML IJ SOLN
INTRAMUSCULAR | Status: AC
  Administered 2018-11-28: 5 mg via INTRAVENOUS
  Filled 2018-11-28: qty 1

## 2018-11-28 MED ORDER — HYDRALAZINE HCL 20 MG/ML IJ SOLN
10.0000 mg | INTRAMUSCULAR | Status: DC | PRN
  Administered 2018-11-28 – 2018-11-29 (×2): 10 mg via INTRAVENOUS
  Filled 2018-11-28 (×2): qty 1

## 2018-11-28 NOTE — Progress Notes (Signed)
Pharmacy note: Renal adjustment  Due to ESRD will adjust pepcid to 20mg  IV q24h.  Please call pharmacy with any questions or concerns.  Thank you, Hildred Laser, PharmD [phone] Clinical Pharmacist **Pharmacist phone directory can now be found on [email or web address] ([email or web address]).  Listed under [place].

## 2018-11-28 NOTE — ED Notes (Signed)
ED TO INPATIENT HANDOFF REPORT  Name/Age/Gender Maurice Bell 63 y.o. male  Code Status    Code Status Orders  (From admission, onward)         Start     Ordered   11/27/18 2245  Full code  Continuous     11/27/18 2245        Code Status History    Date Active Date Inactive Code Status Order ID Comments User Context   11/02/2018 2330 11/07/2018 1708 Full Code [id number]  Norval Morton, MD ED   10/06/2018 1052 10/07/2018 1226 Full Code [id number]  Karmen Bongo, MD ED   09/07/2018 2123 09/11/2018 1728 Full Code [id number]  Vianne Bulls, MD ED   08/15/2018 1924 08/16/2018 2111 Full Code [id number]  Elmarie Shiley, MD Inpatient   07/08/2018 1814 07/09/2018 1306 Full Code [id number]  Marty Heck, DO ED   03/31/2018 0455 04/02/2018 1827 Full Code [id number]  Etta Quill, DO ED    Advance Directive Documentation     Most Recent Value  Type of Advance Directive  Healthcare Power of Attorney  Pre-existing out of facility DNR order (yellow form or pink MOST form)  -  "MOST" Form in Place?  -      Home/SNF/Other Home  Chief Complaint seizure like episode  Level of Care/Admitting Diagnosis ED Disposition    ED Disposition Condition [place]: [place] [100100]  Level of Care: Progressive [102]  I expect the patient will be discharged within 24 hours: No (not a candidate for 5C-Observation unit)  Diagnosis: Uremia [254270]  Admitting Physician: Ivor Costa [[id number]]  Attending Physician: Ivor Costa [[id number]]  PT Class (Do Not Modify): Observation [104]  PT Acc Code (Do Not Modify): Observation [10022]       Medical History Past Medical History:  Diagnosis Date  . Anemia   . Anxiety   . Depression   . ESRD (end stage renal disease) on dialysis ([place])    "[place]; TTS" (10/06/2018)  . Hepatitis C    "never treated" (10/06/2018)  . History of anemia due to CKD   . Hypertension   . Myoclonic jerking   . Stroke [facility])    bilat  leg weakness residual    Allergies No Known Allergies  IV Location/Drains/Wounds Patient Lines/Drains/Airways Status   Active Line/Drains/Airways    Name:   Placement date:   Placement time:   Site:   Days:   Peripheral IV 11/27/18 Right;Lateral Arm   11/27/18    2033    Arm   1   Fistula / Graft Left Forearm Arteriovenous fistula   05/16/17    -    Forearm   561   Hemodialysis Catheter Left Internal jugular Double-lumen;Permanent   11/07/18    0901    Internal jugular   21   Incision (Closed) 11/07/18 Chest Left   11/07/18    0806     21          Labs/Imaging Results for orders placed or performed during the hospital encounter of 11/27/18 (from the past 48 hour(s))  CBG monitoring, ED     Status: Abnormal   Collection Time: 11/27/18  7:59 PM  Result Value Ref Range   Glucose-Capillary 67 (L) 70 - 99 mg/dL  CBC with Differential     Status: Abnormal   Collection Time: 11/27/18  8:32 PM  Result Value Ref Range   WBC 3.5 (L) 4.0 -  10.5 K/uL   RBC 3.32 (L) 4.22 - 5.81 MIL/uL   Hemoglobin 11.8 (L) 13.0 - 17.0 g/dL   HCT 39.4 39.0 - 52.0 %   MCV 118.7 (H) 80.0 - 100.0 fL   MCH 35.5 (H) 26.0 - 34.0 pg   MCHC 29.9 (L) 30.0 - 36.0 g/dL   RDW 20.2 (H) 11.5 - 15.5 %   Platelets 121 (L) 150 - 400 K/uL    Comment: REPEATED TO VERIFY SPECIMEN CHECKED FOR CLOTS    nRBC 0.0 0.0 - 0.2 %   Neutrophils Relative % 54 %   Neutro Abs 1.9 1.7 - 7.7 K/uL   Lymphocytes Relative 26 %   Lymphs Abs 0.9 0.7 - 4.0 K/uL   Monocytes Relative 15 %   Monocytes Absolute 0.5 0.1 - 1.0 K/uL   Eosinophils Relative 3 %   Eosinophils Absolute 0.1 0.0 - 0.5 K/uL   Basophils Relative 1 %   Basophils Absolute 0.0 0.0 - 0.1 K/uL   Immature Granulocytes 1 %   Abs Immature Granulocytes 0.02 0.00 - 0.07 K/uL    Comment: Performed at [facility], [place] [address] [place], [place] [address]  Ethanol     Status: None   Collection Time: 11/27/18  8:32 PM  Result Value Ref Range   Alcohol,  Ethyl (B) <10 <10 mg/dL    Comment: (NOTE) Lowest detectable limit for serum alcohol is 10 mg/dL. For medical purposes only. Performed at [facility], [place] [address], [place], [place] [address]   Comprehensive metabolic panel     Status: Abnormal   Collection Time: 11/27/18  8:32 PM  Result Value Ref Range   Sodium 141 135 - 145 mmol/L   Potassium 6.1 (H) 3.5 - 5.1 mmol/L   Chloride 103 98 - 111 mmol/L   CO2 23 22 - 32 mmol/L   Glucose, Bld 79 70 - 99 mg/dL   BUN 37 (H) 6 - 20 mg/dL   Creatinine, Ser 9.72 (H) 0.61 - 1.24 mg/dL   Calcium 8.9 8.9 - 10.3 mg/dL   Total Protein 7.7 6.5 - 8.1 g/dL   Albumin 4.0 3.5 - 5.0 g/dL   AST 44 (H) 15 - 41 U/L   ALT 33 0 - 44 U/L   Alkaline Phosphatase 88 38 - 126 U/L   Total Bilirubin 1.2 0.3 - 1.2 mg/dL   GFR calc non Af Amer 5 (L) >60 mL/min   GFR calc Af Amer 6 (L) >60 mL/min   Anion gap 15 5 - 15    Comment: Performed at [facility], [place] [address] [place], [place] [address]  Ammonia     Status: None   Collection Time: 11/27/18  8:32 PM  Result Value Ref Range   Ammonia 27 9 - 35 umol/L    Comment: Performed at [facility], [place] [address], [place], [place] [address]  CBG monitoring, ED     Status: Abnormal   Collection Time: 11/27/18  9:48 PM  Result Value Ref Range   Glucose-Capillary 141 (H) 70 - 99 mg/dL  Blood gas, venous     Status: Abnormal   Collection Time: 11/27/18  9:48 PM  Result Value Ref Range   FIO2 21.00    Delivery systems ROOM AIR    pH, Ven 7.388 7.250 - 7.430   pCO2, Ven 46.9 44.0 - 60.0 mmHg   pO2, Ven 39.4 32.0 - 45.0 mmHg   Bicarbonate 27.6 20.0 - 28.0  mmol/L   Acid-Base Excess 2.6 (H) 0.0 - 2.0 mmol/L   O2 Saturation 67.4 %   Patient temperature 98.6    Collection site VEIN    Drawn by COLLECTED BY NURSE    Sample type VENOUS     Comment: Performed at [facility], [place] [address], [place], [place] [address]  CBG monitoring,  ED     Status: Abnormal   Collection Time: 11/28/18 12:54 AM  Result Value Ref Range   Glucose-Capillary 65 (L) 70 - 99 mg/dL  Troponin I - Now Then Q6H     Status: Abnormal   Collection Time: 11/28/18  1:08 AM  Result Value Ref Range   Troponin I 0.03 (HH) <0.03 ng/mL    Comment: CRITICAL RESULT CALLED TO, READ BACK BY AND VERIFIED WITH: Alanson Aly RN [id number] 11/28/2018 HILL K Performed at [facility], [place] [address], [place], [place] [address]   CBG monitoring, ED     Status: Abnormal   Collection Time: 11/28/18  2:27 AM  Result Value Ref Range   Glucose-Capillary 118 (H) 70 - 99 mg/dL  CBG monitoring, ED     Status: None   Collection Time: 11/28/18  3:31 AM  Result Value Ref Range   Glucose-Capillary 74 70 - 99 mg/dL  Troponin I - Now Then Q6H     Status: Abnormal   Collection Time: 11/28/18  4:09 AM  Result Value Ref Range   Troponin I 0.03 (HH) <0.03 ng/mL    Comment: CRITICAL VALUE NOTED.  VALUE IS CONSISTENT WITH PREVIOUSLY REPORTED AND CALLED VALUE. Performed at [facility], [place] [address], [place], [place] [address]   Basic metabolic panel     Status: Abnormal   Collection Time: 11/28/18  4:09 AM  Result Value Ref Range   Sodium 142 135 - 145 mmol/L   Potassium 4.7 3.5 - 5.1 mmol/L    Comment: DELTA CHECK NOTED   Chloride 104 98 - 111 mmol/L   CO2 24 22 - 32 mmol/L   Glucose, Bld 84 70 - 99 mg/dL   BUN 41 (H) 6 - 20 mg/dL   Creatinine, Ser 10.54 (H) 0.61 - 1.24 mg/dL   Calcium 8.5 (L) 8.9 - 10.3 mg/dL   GFR calc non Af Amer 5 (L) >60 mL/min   GFR calc Af Amer 5 (L) >60 mL/min   Anion gap 14 5 - 15    Comment: Performed at [facility], [place] [address], [place], [place] [address]  CBG monitoring, ED     Status: None   Collection Time: 11/28/18  4:23 AM  Result Value Ref Range   Glucose-Capillary 77 70 - 99 mg/dL  CBG monitoring, ED     Status: None   Collection Time: 11/28/18  5:51 AM  Result Value Ref  Range   Glucose-Capillary 86 70 - 99 mg/dL   Ct Head Wo Contrast  Result Date: 11/27/2018 CLINICAL DATA:  Altered LOC EXAM: CT HEAD WITHOUT CONTRAST TECHNIQUE: Contiguous axial images were obtained from the base of the skull through the vertex without intravenous contrast. COMPARISON:  CT 11/02/2018, MRI 09/29/2018 FINDINGS: Brain: No acute territorial infarction, hemorrhage or intracranial mass. Chronic left cerebellar infarct. Mild atrophy. Stable ventricle size. Vascular: No hyperdense vessels.  No unexpected calcification Skull: Normal. Negative for fracture or focal lesion. Sinuses/Orbits: No acute finding. Other: None IMPRESSION: 1. No CT evidence for acute intracranial abnormality. 2. Stable atrophy and chronic left cerebellar infarct Electronically  Signed   By: Donavan Foil M.D.   On: 11/27/2018 20:27   Dg Chest Portable 1 View  Result Date: 11/27/2018 CLINICAL DATA:  Altered mental status EXAM: PORTABLE CHEST 1 VIEW COMPARISON:  11/07/2018, 11/03/2018 FINDINGS: Left-sided central venous catheter tip overlies the proximal right atrium. Borderline to mild cardiomegaly. No consolidation or effusion. No pneumothorax. IMPRESSION: No active disease.  Mild cardiomegaly. Electronically Signed   By: Donavan Foil M.D.   On: 11/27/2018 20:16   EKG Interpretation  Date/Time:  Sunday November 27 2018 21:10:41 EST Ventricular Rate:  71 PR Interval:    QRS Duration: 94 QT Interval:  399 QTC Calculation: 434 R Axis:   -71 Text Interpretation:  Sinus rhythm Borderline prolonged PR interval Probable left atrial enlargement Left anterior fascicular block Consider anterior infarct Abnormal T, consider ischemia, lateral leads tachycardia no longer present compared to Nov 03 2018 T wave changes stable Confirmed by [place], Scott ([id number]) on 11/27/2018 9:27:45 PM   Pending Labs Unresulted Labs (From admission, onward)    Start     Ordered   11/27/18 2244  Urine rapid drug screen (hosp performed)  ONCE -  STAT,   R     01 /26/20 2243   11/27/18 2244  Troponin I - Now Then Q6H  Now then every 6 hours,   R    Question:  Specimen collection method  Answer:  IV Team=IV Team collect   11/27/18 2243          Vitals/Pain Today's Vitals   11/28/18 0330 11/28/18 0430 11/28/18 0544 11/28/18 0600  BP: (!) 216/92 (!) 207/102 (!) 164/97 (!) 177/91  Pulse: 65 77 68 70  Resp: 11 14 12  (!) 3  Temp:      TempSrc:      SpO2: 100% 100% 100% 100%  Weight:      Height:      PainSc:        Isolation Precautions No active isolations  Medications Medications  Chlorhexidine Gluconate Cloth 2 % PADS 6 each (has no administration in time range)  dextrose 50 % solution 50 mL (50 mLs Intravenous Given 11/28/18 0101)  LORazepam (ATIVAN) injection 1 mg (has no administration in time range)  hydrALAZINE (APRESOLINE) injection 5 mg (5 mg Intravenous Given 11/28/18 0343)  ondansetron (ZOFRAN) injection 4 mg (has no administration in time range)  acetaminophen (TYLENOL) tablet 650 mg (has no administration in time range)  cloNIDine (CATAPRES - Dosed in mg/24 hr) patch 0.2 mg (0.2 mg Transdermal Patch Applied 11/28/18 0043)  famotidine (PEPCID) IVPB 20 mg premix (0 mg Intravenous Stopped 11/28/18 0158)  heparin injection 5,000 Units (has no administration in time range)  hydrALAZINE (APRESOLINE) 20 MG/ML injection (has no administration in time range)  hydrALAZINE (APRESOLINE) injection 10 mg (10 mg Intravenous Not Given 11/28/18 0630)  dextrose 50 % solution 50 mL (50 mLs Intravenous Given 11/27/18 2105)  calcium gluconate 1 g/ 50 mL sodium chloride IVPB (0 g Intravenous Stopped 11/27/18 2210)  hydrALAZINE (APRESOLINE) injection 10 mg (10 mg Intravenous Given 11/28/18 0047)  haloperidol lactate (HALDOL) injection 1 mg (1 mg Intravenous Given 11/28/18 0127)    Mobility walks

## 2018-11-28 NOTE — ED Notes (Signed)
Report given to [place] from [facility]

## 2018-11-28 NOTE — ED Notes (Addendum)
Pt again repositioned in bed, pt continually moving in bed and pulling at wires.

## 2018-11-28 NOTE — ED Notes (Addendum)
Pt flailing from side to side of bed and sliding to end of bed. Pt repositioned in bed.

## 2018-11-28 NOTE — Progress Notes (Signed)
Patient arrived the unit via [facility] on a stretcher from [place] long ED, placed on tele ccmd notified, assessment completed see flow sheet, patient very drowsy, no sign of distress noted, seizure precautions and fall precautions in place, MD notified, will continue to monitor.

## 2018-11-28 NOTE — ED Notes (Signed)
Patient remains agitated-monitor alarming-found patient on stretcher on knees-repositioned with help of 2 staff-patient just keeps stating "okay"-unable to tell this Probation officer where he is, time or date. Patient has jerky movements of arms but during these episodes when he tenses up, he is able to say "okay". Patient will then have a period of somnolence after agitated behavior.

## 2018-11-28 NOTE — ED Notes (Signed)
Unable to administer kayexalate enema due to patient's agitation-Amber RN obtained order from Dr. Blaine Hamper

## 2018-11-28 NOTE — ED Notes (Addendum)
Pt still unable to follow commands. Pt very restless.

## 2018-11-28 NOTE — Progress Notes (Signed)
Spoke with RN, patient left for dialysis at 1130, patient should be back ~1600. Will attempt EEG later or when schedule permits.

## 2018-11-28 NOTE — ED Notes (Addendum)
Pt attempted to get out of bed and pulling at his leads. Staff repositioned pt.

## 2018-11-28 NOTE — ED Notes (Signed)
Patient remains restless and agitated, flailing arms and legs-will not follow any commands

## 2018-11-28 NOTE — Procedures (Signed)
I was present at this dialysis session. I have reviewed the session itself and made appropriate changes.   Pt more awake but still AAO x0.    Filed Weights   11/27/18 1944 11/28/18 1205  Weight: 58.3 kg 63 kg    Recent Labs  Lab 11/28/18 0409  NA 142  K 4.7  CL 104  CO2 24  GLUCOSE 84  BUN 41*  CREATININE 10.54*  CALCIUM 8.5*    Recent Labs  Lab 11/27/18 2032 11/28/18 1221  WBC 3.5* 3.6*  NEUTROABS 1.9  --   HGB 11.8* 11.2*  HCT 39.4 36.0*  MCV 118.7* 114.6*  PLT 121* 124*    Scheduled Meds: . Chlorhexidine Gluconate Cloth  6 each Topical Q0600  . cloNIDine  0.2 mg Transdermal Weekly  . heparin  5,000 Units Subcutaneous Q8H   Continuous Infusions: . sodium chloride    . sodium chloride    . [START ON 11/29/2018] famotidine (PEPCID) IV     PRN Meds:.sodium chloride, sodium chloride, acetaminophen, alteplase, dextrose, heparin, hydrALAZINE, lidocaine (PF), lidocaine-prilocaine, LORazepam, ondansetron, pentafluoroprop-tetrafluoroeth   Pearson Grippe  MD 11/28/2018, 3:11 PM
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
## 2018-11-28 NOTE — Progress Notes (Signed)
[place] TEAM 1 - Stepdown/ICU TEAM  Maurice Bell  [id number]:[id number] DOB: July 26, 1956 DOA: 11/27/2018 PCP: [name], PA-C    Brief Narrative:  63 y.o. male with medical history significant of ESRD-HD, hypertension, stroke, GERD, depression, anemia, HCV, thrombocytopenia, and myoclonic jerking movement who presented with altered mental status and recurrent jerking movements.  Family reported a "continuous seizure for the past hour." The pt was altered and unable to provide a hx. ED labs were as follows:  ammonia 27, alcohol level less than 10, potassium 6.1, bicarbonate 23, creatinine 9.72, BUN 37, VBG (pH 7.388, CO2 46.8, O2 67.4). He had a negative CT head for acute intracranial abnormalities.   Significant Events: 1/26 admit at [facility] 1/27 transfer to [facility] for HD   Subjective: Pt is seen in the HD unit. He is sedate and does not awaken to my exam/voice/touch. He is in no apparent resp distress. He does not appear uncomofortable.   Assessment & Plan:  ESRD on dialysis Nephrology following - undergoing HD at time of exam   Hyperkalemia tx per HD today   Acute metabolic encephalopathy - myoclonic jerking had a similar prior presentation with missed HD thought to be related to uremia - CThead negative for acute intracranial abnormalities - no CO2 retention on ABG - consider reconsulting Neurology if symptoms do not improve with dialysis - check EEG  HTN - hypertensive urgency F/u after HD today   Hypoglycemia without diabetes mellitus Likely due to decreased oral intake - CBG stable now   Anemia of chronic disease and pancytopenia Hgb stable - platelet 121 which is a chronic issue  GERD  Abnormal EKG No evidence of STEMI - troponin not significantly elevated   DVT prophylaxis: SQ heparin  Code Status: FULL CODE Family Communication: no family present at time of exam  Disposition Plan: SDU  Consultants:  Nephrology   Antimicrobials:   none  Objective: Blood pressure (!) 175/72, pulse 65, temperature 97.8 F (36.6 C), temperature source Axillary, resp. rate (!) 8, height 5\' 5"  (1.651 m), weight 58.3 kg, SpO2 98 %.  Intake/Output Summary (Last 24 hours) at 11/28/2018 1137 Last data filed at 11/28/2018 0158 Gross per 24 hour  Intake 100 ml  Output -  Net 100 ml   Filed Weights   11/27/18 1944  Weight: 58.3 kg    Examination: General: No acute respiratory distress Lungs: Clear to auscultation bilaterally without wheezes or crackles Cardiovascular: Regular rate and rhythm without murmur gallop or rub normal S1 and S2 Abdomen: Nontender, nondistended, soft, bowel sounds positive, no rebound, no ascites, no appreciable mass Extremities: No significant cyanosis, clubbing, or edema bilateral lower extremities  CBC: Recent Labs  Lab 11/27/18 2032  WBC 3.5*  NEUTROABS 1.9  HGB 11.8*  HCT 39.4  MCV 118.7*  PLT 097*   Basic Metabolic Panel: Recent Labs  Lab 11/27/18 2032 11/28/18 0409  NA 141 142  K 6.1* 4.7  CL 103 104  CO2 23 24  GLUCOSE 79 84  BUN 37* 41*  CREATININE 9.72* 10.54*  CALCIUM 8.9 8.5*   GFR: Estimated Creatinine Clearance: 6.1 mL/min (A) (by C-G formula based on SCr of 10.54 mg/dL (H)).  Liver Function Tests: Recent Labs  Lab 11/27/18 2032  AST 44*  ALT 33  ALKPHOS 88  BILITOT 1.2  PROT 7.7  ALBUMIN 4.0    Recent Labs  Lab 11/27/18 2032  AMMONIA 27    Cardiac Enzymes: Recent Labs  Lab 11/28/18 0108 11/28/18 0409  TROPONINI  0.03* 0.03*    CBG: Recent Labs  Lab 11/28/18 0331 11/28/18 0423 11/28/18 0551 11/28/18 0708 11/28/18 0922  GLUCAP 74 77 86 93 85    Scheduled Meds: . Chlorhexidine Gluconate Cloth  6 each Topical Q0600  . cloNIDine  0.2 mg Transdermal Weekly  . heparin  5,000 Units Subcutaneous Q8H  . hydrALAZINE      . hydrALAZINE  10 mg Intravenous Once     LOS: 0 days   Cherene Altes, MD [facility] Office   [phone] Pager - Text Page per Amion  If 7PM-7AM, please contact night-coverage per Amion 11/28/2018, 11:37 AM

## 2018-11-28 NOTE — ED Notes (Signed)
Pt is trying to get out of bed. Staff attempted to calm down pt and deescalate.

## 2018-11-28 NOTE — ED Notes (Signed)
RN attempted to give ordered Kayexalate. Pt is restless in bed, unable to stay still. RN will attempt at a later time.

## 2018-11-28 NOTE — ED Notes (Signed)
Report given to Dialysis RN at [facility].

## 2018-11-28 NOTE — ED Notes (Signed)
Report given to [place] from [place]. Jenny Reichmann stated that the day shift will pick him up.

## 2018-11-28 NOTE — ED Notes (Signed)
Attempt to call report-secretary states RN will call this writer back

## 2018-11-28 NOTE — ED Notes (Signed)
Patient experiencing periods of sleep apnea throughout the night lasting 5-8 seconds-occ snoring resp prior and after episodes

## 2018-11-28 NOTE — ED Notes (Signed)
Pt is attempting to get out of bed while this Probation officer and other staff are collecting blood. Staff oriented the patient and tried to deescalate.

## 2018-11-28 NOTE — ED Notes (Signed)
Patient remains restless in bed with occasional agitation

## 2018-11-28 NOTE — Progress Notes (Signed)
Patient back from Dialysis, alert but confused very restless and trying to get out of bed, patient not following commands unable to complete neuro check at this time, fall and seizure precautions in place MD notified to get an order for a safety sitter. will continue to monitor.

## 2018-11-29 ENCOUNTER — Inpatient Hospital Stay (HOSPITAL_COMMUNITY): Payer: Medicare Other

## 2018-11-29 DIAGNOSIS — G9341 Metabolic encephalopathy: Secondary | ICD-10-CM

## 2018-11-29 LAB — COMPREHENSIVE METABOLIC PANEL
ALT: 43 U/L (ref 0–44)
AST: 53 U/L — ABNORMAL HIGH (ref 15–41)
Albumin: 3.8 g/dL (ref 3.5–5.0)
Alkaline Phosphatase: 63 U/L (ref 38–126)
Anion gap: 16 — ABNORMAL HIGH (ref 5–15)
BILIRUBIN TOTAL: 0.9 mg/dL (ref 0.3–1.2)
BUN: 17 mg/dL (ref 6–20)
CO2: 23 mmol/L (ref 22–32)
Calcium: 9.2 mg/dL (ref 8.9–10.3)
Chloride: 97 mmol/L — ABNORMAL LOW (ref 98–111)
Creatinine, Ser: 7.4 mg/dL — ABNORMAL HIGH (ref 0.61–1.24)
GFR calc Af Amer: 8 mL/min — ABNORMAL LOW (ref >60)
GFR calc non Af Amer: 7 mL/min — ABNORMAL LOW (ref >60)
Glucose, Bld: 97 mg/dL (ref 70–99)
POTASSIUM: 4.3 mmol/L (ref 3.5–5.1)
Sodium: 136 mmol/L (ref 135–145)
TOTAL PROTEIN: 7.4 g/dL (ref 6.5–8.1)

## 2018-11-29 LAB — GLUCOSE, CAPILLARY
Glucose-Capillary: 107 mg/dL — ABNORMAL HIGH (ref 70–99)
Glucose-Capillary: 88 mg/dL (ref 70–99)
Glucose-Capillary: 93 mg/dL (ref 70–99)
Glucose-Capillary: 97 mg/dL (ref 70–99)
Glucose-Capillary: 98 mg/dL (ref 70–99)

## 2018-11-29 LAB — CBC
HCT: 40.2 % (ref 39.0–52.0)
HEMOGLOBIN: 12.9 g/dL — AB (ref 13.0–17.0)
MCH: 35.2 pg — ABNORMAL HIGH (ref 26.0–34.0)
MCHC: 32.1 g/dL (ref 30.0–36.0)
MCV: 109.8 fL — ABNORMAL HIGH (ref 80.0–100.0)
Platelets: 136 10*3/uL — ABNORMAL LOW (ref 150–400)
RBC: 3.66 MIL/uL — ABNORMAL LOW (ref 4.22–5.81)
RDW: 18.9 % — ABNORMAL HIGH (ref 11.5–15.5)
WBC: 4.3 10*3/uL (ref 4.0–10.5)
nRBC: 0 % (ref 0.0–0.2)

## 2018-11-29 MED ORDER — VITAMIN B-1 100 MG PO TABS
100.0000 mg | ORAL_TABLET | Freq: Every day | ORAL | Status: DC
  Administered 2018-11-29 – 2018-11-30 (×2): 100 mg via ORAL
  Filled 2018-11-29 (×2): qty 1

## 2018-11-29 MED ORDER — CLONIDINE HCL 0.1 MG PO TABS
0.1000 mg | ORAL_TABLET | Freq: Two times a day (BID) | ORAL | Status: DC
  Administered 2018-11-29: 0.1 mg via ORAL
  Filled 2018-11-29 (×2): qty 1

## 2018-11-29 MED ORDER — AMLODIPINE BESYLATE 10 MG PO TABS
10.0000 mg | ORAL_TABLET | Freq: Every day | ORAL | Status: DC
  Administered 2018-11-29: 10 mg via ORAL
  Filled 2018-11-29: qty 1

## 2018-11-29 NOTE — Procedures (Signed)
ELECTROENCEPHALOGRAM REPORT   Patient: Maurice Bell       Room #: 8B33O EEG No. ID: [id number] Age: 63 y.o.        Sex: male Referring Physician: Thereasa Solo Report Date:  11/29/2018        Interpreting Physician: Alexis Goodell  History: Nam Vossler is an 63 y.o. male with altered mental status  Medications:  Amlodipine, Catapres, Thiamine  Conditions of Recording:  This is a 21 channel routine scalp EEG performed with bipolar and monopolar montages arranged in accordance to the international 10/20 system of electrode placement. One channel was dedicated to EKG recording.  The patient is in the awake and drowsy states.  Description:  The waking background activity consists of mostly theta activity.  The posterior background rhythm consists of a low voltage, symmetrical, fairly well organized, 6-7 Hz theta activity, seen from the parieto-occipital and posterior temporal regions.  A mixture of theta remains most dominant from the the central and temporal regions. The patient closes his eyes with further slowing noted.  At this time the background takes on a somewhat higher voltage and delta activity becomes the most dominant rhythm.   There are also noted intermittently transient discharges of triphasic morphology. Hyperventilation and intermittent photic stimulation were not performed.  IMPRESSION: This is an abnormal electroencephalogram due to general background slowing and intermittent transient discharges of triphasic morphology. This is seen most commonly in encephalopathic states.  Although most often seen with hepatic encephalopathies, it is not isolated to this clinical scenario.  Marland Kitchenlre Alexis Goodell, MD Neurology [phone] 11/29/2018, 6:06 PM

## 2018-11-29 NOTE — Progress Notes (Addendum)
Admit: 11/27/2018 LOS: 22  63 year old male recurrent encephalopathy, mild hyperkalemia; ESRD  Subjective:  . HD yesterday, 3.5 L ultrafiltration, tolerated well . This morning more awake and alert, only oriented to self . Outpatient medication review notable for chronic valacyclovir, pregabalin, and baclofen; uncertain if he is using his medications . Afebrile, blood pressures remain elevated but improved, on room air  01/27 0701 - 01/28 0700 In: 0  Out: 3500   Filed Weights   11/28/18 1205 11/28/18 1618 11/29/18 0538  Weight: 63 kg 59.2 kg 60.6 kg    Scheduled Meds: . Chlorhexidine Gluconate Cloth  6 each Topical Q0600  . cloNIDine  0.2 mg Transdermal Weekly  . heparin  5,000 Units Subcutaneous Q8H   Continuous Infusions: . famotidine (PEPCID) IV     PRN Meds:.acetaminophen, dextrose, hydrALAZINE, LORazepam, ondansetron  Current Labs: reviewed  Drug Screen serum in process   Physical Exam:  Blood pressure (!) 168/91, pulse 86, temperature 98.5 F (36.9 C), temperature source Oral, resp. rate 16, height 5\' 5"  (1.651 m), weight 60.6 kg, SpO2 100 %. GEN: Well-developed, more awake and alert but confusion persists ENT: NCAT CV: RRR, no rub PULM: Clear bilaterally ABD: Soft, non-distended, bowel sounds present SKIN: Left tunneled internal jugular hemodialysis catheter clean/dry/intact with no purulence or surrounding erythema EXT: No lower extremity edema NEURO:  Today nonfocal, alert and oriented to self only A 1. AMS, recurrent 2. ESRD THS L IJ TDC 3. HTN uncontrolled, improving 4. Hyperkalemia, mild stable; resolved 5. Anemia, mild pancytopenia,   P . At this time stop pregabalin, valacyclovir, I do not think he has been taking baclofen . Continue dialysis on schedule: 2K, 2-3L UF, Tight heparin, TDC   Pearson Grippe MD 11/29/2018, 9:24 AM  Recent Labs  Lab 11/27/18 2032 11/28/18 0409 11/29/18 0255  NA 141 142 136  K 6.1* 4.7 4.3  CL 103 104 97*  CO2 23  24 23   GLUCOSE 79 84 97  BUN 37* 41* 17  CREATININE 9.72* 10.54* 7.40*  CALCIUM 8.9 8.5* 9.2   Recent Labs  Lab 11/27/18 2032 11/28/18 1221 11/29/18 0255  WBC 3.5* 3.6* 4.3  NEUTROABS 1.9  --   --   HGB 11.8* 11.2* 12.9*  HCT 39.4 36.0* 40.2  MCV 118.7* 114.6* 109.8*  PLT 121* 124* 136*

## 2018-11-29 NOTE — Progress Notes (Signed)
   11/29/18 1000  Clinical Encounter Type  Visited With Patient  Visit Type Initial  Referral From Other (Comment)  Consult/Referral To Other (Comment)  Spiritual Encounters  Spiritual Needs Emotional  Stress Factors  Patient Stress Factors None identified   While rounding I met PT in room. PT was alert and talkative. PT was very thankful for the [place] visit. I offered spiritual care with words of encouragement and ministry of presence. Chaplain available as needed.  Chaplain Fidel Levy  [phone]

## 2018-11-29 NOTE — Progress Notes (Signed)
EEG completed; results pending.    

## 2018-11-29 NOTE — Progress Notes (Signed)
[place] TEAM 1 - Stepdown/ICU TEAM  Maurice Bell  VEL:[id number] DOB: November 13, 1955 DOA: 11/27/2018 PCP: [name], PA-C    Brief Narrative:  63 y.o. male with medical history significant of ESRD-HD, hypertension, stroke, GERD, depression, anemia, HCV, thrombocytopenia, and myoclonic jerking movement who presented with altered mental status and recurrent jerking movements.  Family reported a "continuous seizure for the past hour." The pt was altered and unable to provide a hx. ED labs were as follows:  ammonia 27, alcohol level less than 10, potassium 6.1, bicarbonate 23, creatinine 9.72, BUN 37, VBG (pH 7.388, CO2 46.8, O2 67.4). He had a negative CT head for acute intracranial abnormalities.   Significant Events: 1/26 admit at [facility] 1/27 transfer to [facility] for HD   Subjective: Mental status is slowly returning to normal. Medication changes made by Nephrology to remove potential offenders. Pt currently denies cp, n/v, abdom pain, or sob. His brother reports that overall the pt is doing much better since having the HD cath placed in his chest.;   Assessment & Plan:  ESRD on dialysis Nephrology following   Hyperkalemia Corrected w/ HD  Acute metabolic encephalopathy - myoclonic jerking had a similar prior presentation with missed HD thought to be related to uremia - CThead negative for acute intracranial abnormalities - no CO2 retention on ABG - symptoms have slowly but steadily improve - etiology still not clear - all potential offending prescription meds stopped by Nephrology   HTN - hypertensive urgency BP variable - adjust tx and cont to follow   Hypoglycemia without diabetes mellitus Likely due to decreased oral intake - CBG stable now   Anemia of chronic disease and pancytopenia Hgb stable - platelet 121 which is a chronic issue  Macrocytosis  C58 and folic acid normal earlier this month   GERD  Abnormal EKG No evidence of STEMI - troponin not  significantly elevated   DVT prophylaxis: SQ heparin  Code Status: FULL CODE Family Communication: spoke w/ brother at bedside   Disposition Plan: tele - potential d/c in 24-48hrs   Consultants:  Nephrology   Antimicrobials:  none  Objective: Blood pressure (!) 160/74, pulse (!) 101, temperature 97.9 F (36.6 C), temperature source Oral, resp. rate 18, height 5\' 5"  (1.651 m), weight 60.6 kg, SpO2 98 %.  Intake/Output Summary (Last 24 hours) at 11/29/2018 1714 Last data filed at 11/29/2018 1700 Gross per 24 hour  Intake 650 ml  Output -  Net 650 ml   Filed Weights   11/28/18 1205 11/28/18 1618 11/29/18 0538  Weight: 63 kg 59.2 kg 60.6 kg    Examination: General: No acute respiratory distress Lungs: CTA B - no wheezing  Cardiovascular: RRR - no M or rub  Abdomen: NT/ND, soft, bs+, no mass  Extremities: No signif edema bilateral lower extremities  CBC: Recent Labs  Lab 11/27/18 2032 11/28/18 1221 11/29/18 0255  WBC 3.5* 3.6* 4.3  NEUTROABS 1.9  --   --   HGB 11.8* 11.2* 12.9*  HCT 39.4 36.0* 40.2  MCV 118.7* 114.6* 109.8*  PLT 121* 124* 527*   Basic Metabolic Panel: Recent Labs  Lab 11/27/18 2032 11/28/18 0409 11/29/18 0255  NA 141 142 136  K 6.1* 4.7 4.3  CL 103 104 97*  CO2 23 24 23   GLUCOSE 79 84 97  BUN 37* 41* 17  CREATININE 9.72* 10.54* 7.40*  CALCIUM 8.9 8.5* 9.2   GFR: Estimated Creatinine Clearance: 9.1 mL/min (A) (by C-G formula based on SCr of 7.4  mg/dL (H)).  Liver Function Tests: Recent Labs  Lab 11/27/18 2032 11/29/18 0255  AST 44* 53*  ALT 33 43  ALKPHOS 88 63  BILITOT 1.2 0.9  PROT 7.7 7.4  ALBUMIN 4.0 3.8    Recent Labs  Lab 11/27/18 2032  AMMONIA 27    Cardiac Enzymes: Recent Labs  Lab 11/28/18 0108 11/28/18 0409  TROPONINI 0.03* 0.03*    CBG: Recent Labs  Lab 11/29/18 0019 11/29/18 0353 11/29/18 0818 11/29/18 1113 11/29/18 1616  GLUCAP 88 97 93 98 107*    Scheduled Meds: . Chlorhexidine Gluconate  Cloth  6 each Topical Q0600  . cloNIDine  0.2 mg Transdermal Weekly  . heparin  5,000 Units Subcutaneous Q8H     LOS: 1 day   Cherene Altes, MD [facility] Office  [phone] Pager - Text Page per Amion  If 7PM-7AM, please contact night-coverage per Amion 11/29/2018, 5:14 PM

## 2018-11-29 NOTE — Progress Notes (Signed)
Called made three times to EEG dept to see if they cam send for this patient. Voice  mail message left as the calls were not answered.

## 2018-11-30 DIAGNOSIS — E875 Hyperkalemia: Secondary | ICD-10-CM

## 2018-11-30 DIAGNOSIS — N186 End stage renal disease: Secondary | ICD-10-CM

## 2018-11-30 DIAGNOSIS — D638 Anemia in other chronic diseases classified elsewhere: Secondary | ICD-10-CM

## 2018-11-30 DIAGNOSIS — Z992 Dependence on renal dialysis: Secondary | ICD-10-CM

## 2018-11-30 DIAGNOSIS — N19 Unspecified kidney failure: Secondary | ICD-10-CM

## 2018-11-30 LAB — DRUG SCREEN 10 W/CONF, SERUM
Amphetamines, IA: NEGATIVE ng/mL
Barbiturates, IA: NEGATIVE ug/mL
Benzodiazepines, IA: NEGATIVE ng/mL
Cocaine & Metabolite, IA: NEGATIVE ng/mL
Methadone, IA: NEGATIVE ng/mL
Opiates, IA: NEGATIVE ng/mL
Oxycodones, IA: NEGATIVE ng/mL
Phencyclidine, IA: NEGATIVE ng/mL
Propoxyphene, IA: NEGATIVE ng/mL
THC(Marijuana) Metabolite, IA: NEGATIVE ng/mL

## 2018-11-30 LAB — RENAL FUNCTION PANEL
Albumin: 3.7 g/dL (ref 3.5–5.0)
Anion gap: 17 — ABNORMAL HIGH (ref 5–15)
BUN: 48 mg/dL — ABNORMAL HIGH (ref 6–20)
CALCIUM: 8.7 mg/dL — AB (ref 8.9–10.3)
CO2: 24 mmol/L (ref 22–32)
Chloride: 95 mmol/L — ABNORMAL LOW (ref 98–111)
Creatinine, Ser: 11.24 mg/dL — ABNORMAL HIGH (ref 0.61–1.24)
GFR calc Af Amer: 5 mL/min — ABNORMAL LOW (ref >60)
GFR calc non Af Amer: 4 mL/min — ABNORMAL LOW (ref >60)
Glucose, Bld: 89 mg/dL (ref 70–99)
Phosphorus: 7.9 mg/dL — ABNORMAL HIGH (ref 2.5–4.6)
Potassium: 4.5 mmol/L (ref 3.5–5.1)
Sodium: 136 mmol/L (ref 135–145)

## 2018-11-30 LAB — CBC
HCT: 37.1 % — ABNORMAL LOW (ref 39.0–52.0)
Hemoglobin: 11.6 g/dL — ABNORMAL LOW (ref 13.0–17.0)
MCH: 35.2 pg — ABNORMAL HIGH (ref 26.0–34.0)
MCHC: 31.3 g/dL (ref 30.0–36.0)
MCV: 112.4 fL — AB (ref 80.0–100.0)
PLATELETS: 131 10*3/uL — AB (ref 150–400)
RBC: 3.3 MIL/uL — ABNORMAL LOW (ref 4.22–5.81)
RDW: 18.5 % — ABNORMAL HIGH (ref 11.5–15.5)
WBC: 3.2 10*3/uL — ABNORMAL LOW (ref 4.0–10.5)
nRBC: 0 % (ref 0.0–0.2)

## 2018-11-30 LAB — GLUCOSE, CAPILLARY
Glucose-Capillary: 121 mg/dL — ABNORMAL HIGH (ref 70–99)
Glucose-Capillary: 93 mg/dL (ref 70–99)

## 2018-11-30 MED ORDER — HYDROCODONE-ACETAMINOPHEN 5-325 MG PO TABS
ORAL_TABLET | ORAL | Status: AC
  Filled 2018-11-30: qty 1

## 2018-11-30 MED ORDER — HYDROCODONE-ACETAMINOPHEN 5-325 MG PO TABS
1.0000 | ORAL_TABLET | Freq: Four times a day (QID) | ORAL | Status: DC | PRN
  Administered 2018-11-30 (×2): 1 via ORAL
  Filled 2018-11-30: qty 1

## 2018-11-30 MED ORDER — PREGABALIN 25 MG PO CAPS
25.0000 mg | ORAL_CAPSULE | Freq: Every day | ORAL | Status: DC
  Administered 2018-11-30: 25 mg via ORAL
  Filled 2018-11-30: qty 1

## 2018-11-30 MED ORDER — HEPARIN SODIUM (PORCINE) 1000 UNIT/ML DIALYSIS
20.0000 [IU]/kg | INTRAMUSCULAR | Status: DC | PRN
  Administered 2018-11-30: 1200 [IU] via INTRAVENOUS_CENTRAL
  Filled 2018-11-30: qty 1.2

## 2018-11-30 MED ORDER — SEVELAMER CARBONATE 800 MG PO TABS
3200.0000 mg | ORAL_TABLET | Freq: Three times a day (TID) | ORAL | Status: DC
  Filled 2018-11-30: qty 4

## 2018-11-30 MED ORDER — HEPARIN SODIUM (PORCINE) 1000 UNIT/ML IJ SOLN
INTRAMUSCULAR | Status: AC
  Filled 2018-11-30: qty 4

## 2018-11-30 NOTE — Progress Notes (Signed)
Patient returned from dialysis, he stated that '' I am going home today Joelyn Oms my nephrologist said I can go home today. Aileen Fass MD notified. MD said that the patient will be discharged home first thing in the morning tomorrow. Patient refused and insisted on leaving the hospital today. He stated that he will sign himself out. Patient education completed on the risk of leaving the hospital AMA. He verbalized understanding and stated that ''I am doing fine now'' AMA form completed, MD notified. Patient to be transported home by his brother along with his personal belongings.

## 2018-11-30 NOTE — Progress Notes (Addendum)
Pt states that he was to have an appointment with an orthopedic doctor on Monday 11/28/18 but he was in the hospital. The reason for his issues is a vertebrae in his neck that makes a "cracking" sound. He says the pain radiates from his neck down the right side of his body to his toes. When he ambulates, it causes him pain, as evidenced by the wincing face he made when ambulating to the bathroom this morning. Pt refused tylenol because he takes that at home and says that it isn't strong enough to combat the pain. He needs something stronger for the pain. Told patient that I would pass his concerns on to the day shift nurse so that he/she can relay the message to the oncoming hospitalist MD. Will continue to monitor. Lajoyce Corners, RN

## 2018-11-30 NOTE — Procedures (Signed)
I was present at this dialysis session. I have reviewed the session itself and made appropriate changes.   Mental status issues resolved, pt at baseline; I know him from outpt unit.  Have stopped lyrica, valtrex.  He states no baclfen prior ot admit, advised agains this moving fwd.  OK for DC later today or tomorrow in AM and he can go to outpt HD thereafter.  Filed Weights   11/28/18 1618 11/29/18 0538 11/30/18 1150  Weight: 59.2 kg 60.6 kg 60.1 kg    Recent Labs  Lab 11/29/18 0255  NA 136  K 4.3  CL 97*  CO2 23  GLUCOSE 97  BUN 17  CREATININE 7.40*  CALCIUM 9.2    Recent Labs  Lab 11/27/18 2032 11/28/18 1221 11/29/18 0255  WBC 3.5* 3.6* 4.3  NEUTROABS 1.9  --   --   HGB 11.8* 11.2* 12.9*  HCT 39.4 36.0* 40.2  MCV 118.7* 114.6* 109.8*  PLT 121* 124* 136*    Scheduled Meds: . amLODipine  10 mg Oral QHS  . Chlorhexidine Gluconate Cloth  6 each Topical Q0600  . cloNIDine  0.1 mg Oral BID  . heparin  5,000 Units Subcutaneous Q8H  . thiamine  100 mg Oral Daily   Continuous Infusions: PRN Meds:.dextrose, heparin, hydrALAZINE, HYDROcodone-acetaminophen, ondansetron   Pearson Grippe  MD 11/30/2018, 12:27 PM

## 2018-11-30 NOTE — Progress Notes (Signed)
[facility] PROGRESS NOTE    Progress Note  Maurice Bell  FWY:[id number] DOB: September 04, 1956 DOA: 11/27/2018 PCP: [name], PA-C     Brief Narrative:   Maurice Bell is an 63 y.o. male past medical history of end-stage renal disease on hemodialysis, hypertension stroke HSV and thrombocytopenia presents with myoclonic jerking movements and altered mental status and continue seizure for 1 hour, with an alcohol level less than 10, potassium 6.1, bicarb 21 creatinine of 9.7, CT head was negative.  Significant Events: 1/26 admit at [facility] 1/27 transfer to [facility] for HD   Assessment/Plan:   Uremia/end-stage renal disease on dialysis/hyperkalemia: Further dialysis regimen per renal. Likely due to noncompliance with dialysis  Acute metabolic encephalopathy/myoclonic jerking movement: With similar presentations in the past due to missed dialysis, in the setting of Lyrica Requip use likely the offending agents. CT head was unrevealing. ABG showed no CO2 retention.  Essential HTN (hypertension) Current regimen and dialysis.  Hyperglycemia without diabetes mellitus: Likely due to to decreased oral intake. Has been stable in house.  Anemia of chronic disease/pancytopenia: Likely due to renal disease.  Macrocytosis: On B12 and folate supplementation.   DVT prophylaxis: SQ heparin Family Communication: brother Disposition Plan/Barrier to D/C: Home tomorrow discontinue telemetry. Code Status:     Code Status Orders  (From admission, onward)         Start     Ordered   11/27/18 2245  Full code  Continuous     11/27/18 2245        Code Status History    Date Active Date Inactive Code Status Order ID Comments User Context   11/02/2018 2330 11/07/2018 1708 Full Code [id number]  Norval Morton, MD ED   10/06/2018 1052 10/07/2018 1226 Full Code [id number]  Karmen Bongo, MD ED   09/07/2018 2123 09/11/2018 1728 Full Code [id number]  Vianne Bulls, MD ED   08/15/2018  1924 08/16/2018 2111 Full Code [id number]  Elmarie Shiley, MD Inpatient   07/08/2018 1814 07/09/2018 1306 Full Code [id number]  Marty Heck, DO ED   03/31/2018 0455 04/02/2018 1827 Full Code [id number]  Etta Quill, DO ED    Advance Directive Documentation     Most Recent Value  Type of Advance Directive  Healthcare Power of Attorney  Pre-existing out of facility DNR order (yellow form or pink MOST form)  -  "MOST" Form in Place?  -        IV Access:    Peripheral IV   Procedures and diagnostic studies:   No results found.   Medical Consultants:    None.  Anti-Infectives:   None  Subjective:    [place] patient very talkative complaining of neck pain was able to tolerate his diet.  Objective:    Vitals:   11/29/18 1736 11/29/18 2100 11/30/18 0429 11/30/18 0710  BP: (!) 185/93 (!) 163/90 118/90 130/88  Pulse: (!) 101 89 80   Resp: (!) 23 17  (!) 23  Temp: 98.2 F (36.8 C) 98.6 F (37 C) 97.8 F (36.6 C) 98.3 F (36.8 C)  TempSrc: Oral Oral Oral Oral  SpO2: 100% 100% 99%   Weight:      Height:        Intake/Output Summary (Last 24 hours) at 11/30/2018 0715 Last data filed at 11/29/2018 1700 Gross per 24 hour  Intake 650 ml  Output -  Net 650 ml   Filed Weights   11/28/18 1205 11/28/18 1618 11/29/18 0538  Weight:  63 kg 59.2 kg 60.6 kg    Exam: General exam: In no acute distress. Respiratory system: Good air movement and clear to auscultation. Cardiovascular system: S1 & S2 heard, RRR.  Gastrointestinal system: Abdomen is nondistended, soft and nontender.  Central nervous system: Alert and oriented. No focal neurological deficits. Extremities: No pedal edema. Skin: No rashes, lesions or ulcers     Data Reviewed:    Labs: Basic Metabolic Panel: Recent Labs  Lab 11/27/18 2032 11/28/18 0409 11/29/18 0255  NA 141 142 136  K 6.1* 4.7 4.3  CL 103 104 97*  CO2 23 24 23   GLUCOSE 79 84 97  BUN 37* 41* 17  CREATININE 9.72*  10.54* 7.40*  CALCIUM 8.9 8.5* 9.2   GFR Estimated Creatinine Clearance: 9.1 mL/min (A) (by C-G formula based on SCr of 7.4 mg/dL (H)). Liver Function Tests: Recent Labs  Lab 11/27/18 2032 11/29/18 0255  AST 44* 53*  ALT 33 43  ALKPHOS 88 63  BILITOT 1.2 0.9  PROT 7.7 7.4  ALBUMIN 4.0 3.8   No results for input(s): LIPASE, AMYLASE in the last 168 hours. Recent Labs  Lab 11/27/18 2032  AMMONIA 27   Coagulation profile No results for input(s): INR, PROTIME in the last 168 hours.  CBC: Recent Labs  Lab 11/27/18 2032 11/28/18 1221 11/29/18 0255  WBC 3.5* 3.6* 4.3  NEUTROABS 1.9  --   --   HGB 11.8* 11.2* 12.9*  HCT 39.4 36.0* 40.2  MCV 118.7* 114.6* 109.8*  PLT 121* 124* 136*   Cardiac Enzymes: Recent Labs  Lab 11/28/18 0108 11/28/18 0409  TROPONINI 0.03* 0.03*   BNP (last 3 results) No results for input(s): PROBNP in the last 8760 hours. CBG: Recent Labs  Lab 11/29/18 0353 11/29/18 0818 11/29/18 1113 11/29/18 1616 11/30/18 0000  GLUCAP 97 93 98 107* 93   D-Dimer: No results for input(s): DDIMER in the last 72 hours. Hgb A1c: No results for input(s): HGBA1C in the last 72 hours. Lipid Profile: No results for input(s): CHOL, HDL, LDLCALC, TRIG, CHOLHDL, LDLDIRECT in the last 72 hours. Thyroid function studies: No results for input(s): TSH, T4TOTAL, T3FREE, THYROIDAB in the last 72 hours.  Invalid input(s): FREET3 Anemia work up: No results for input(s): VITAMINB12, FOLATE, FERRITIN, TIBC, IRON, RETICCTPCT in the last 72 hours. Sepsis Labs: Recent Labs  Lab 11/27/18 2032 11/28/18 1221 11/29/18 0255  WBC 3.5* 3.6* 4.3   Microbiology No results found for this or any previous visit (from the past 240 hour(s)).   Medications:   . amLODipine  10 mg Oral QHS  . Chlorhexidine Gluconate Cloth  6 each Topical Q0600  . cloNIDine  0.1 mg Oral BID  . heparin  5,000 Units Subcutaneous Q8H  . thiamine  100 mg Oral Daily   Continuous  Infusions:    LOS: 2 days   Charlynne Cousins  [facility]  11/30/2018, 7:15 AM

## 2018-11-30 NOTE — Progress Notes (Signed)
Renvela 3200mg  TID with meals ordered per verbal order form Aileen Fass, MD .

## 2018-11-30 NOTE — Evaluation (Signed)
Physical Therapy Evaluation Patient Details Name: Maurice Bell MRN: [id number] DOB: 1956-07-25 Today's Date: 11/30/2018   History of Present Illness  Pt is a 63 y.o. M with significant PMH of ESRD on hemodialysis, hypertension, stroke, HSV, who presents with myoclonic jerking movements and AMS with continued seizure for 1 hour. CT head was negative.  Clinical Impression  Patient presents with functional limitations in mobility and transfers due to cervical pain with radiculopathy and associated balance impairments and decreased cervical AROM. Pt is being followed by an orthopedist. On PT evaluation, pt ambulating 380 feet with no assistive device and min guard assist. Patient will benefit from continued PT to increase their independence and safety with mobility to allow discharge home.       Follow Up Recommendations Supervision - Intermittent;Outpatient PT    Equipment Recommendations  None recommended by PT    Recommendations for Other Services       Precautions / Restrictions Precautions Precautions: Fall Restrictions Weight Bearing Restrictions: No      Mobility  Bed Mobility Overal bed mobility: Independent                Transfers Overall transfer level: Independent Equipment used: None                Ambulation/Gait Ambulation/Gait assistance: Min guard Gait Distance (Feet): 380 Feet Assistive device: None Gait Pattern/deviations: Drifts right/left;Step-through pattern;Antalgic;Decreased stance time - right   Gait velocity interpretation: >2.62 ft/sec, indicative of community ambulatory General Gait Details: pt with occasional mild LOB laterally requiring min guard assist with no assistive device. Pt with antalgic gait pattern, decreased reciprocal arm swing, and guarded posture (left lateral lean)  Stairs            Wheelchair Mobility    Modified Rankin (Stroke Patients Only)       Balance Overall balance assessment: Needs  assistance Sitting-balance support: Feet supported;No upper extremity supported Sitting balance-Leahy Scale: Good Sitting balance - Comments: able to reach outside BOS    Standing balance support: No upper extremity supported;During functional activity Standing balance-Leahy Scale: Good                               Pertinent Vitals/Pain Pain Assessment: Faces Faces Pain Scale: Hurts little more Pain Location: neck Pain Descriptors / Indicators: Constant;Sharp Pain Intervention(s): Monitored during session;Patient requesting pain meds-RN notified    Home Living Family/patient expects to be discharged to:: Private residence Living Arrangements: Parent;Children Available Help at Discharge: Family;Available PRN/intermittently(son) Type of Home: House Home Access: Level entry     Home Layout: One level Home Equipment: Walker - 2 wheels;Cane - single point      Prior Function Level of Independence: Independent         Comments: Drives to dialysis, 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     Hand Dominance        Extremity/Trunk Assessment   Upper Extremity Assessment Upper Extremity Assessment: RUE deficits/detail;LUE deficits/detail RUE Deficits / Details: Strength 5/5, cervical pain with shoulder flexion LUE Deficits / Details: Strength 5/5, cervical pain with shoulder flexion    Lower Extremity Assessment Lower Extremity Assessment: RLE deficits/detail;LLE deficits/detail RLE Deficits / Details: Strength 5/5 LLE Deficits / Details: Strength 5/5    Cervical / Trunk Assessment Cervical / Trunk Assessment: Other exceptions Cervical / Trunk Exceptions: Limited cervical AROM: right lateral flexion, extension  Communication   Communication: No difficulties  Cognition Arousal/Alertness: Awake/alert Behavior  During Therapy: WFL for tasks assessed/performed Overall Cognitive Status: Within Functional Limits for tasks assessed                                         General Comments General comments (skin integrity, edema, etc.): VSS    Exercises     Assessment/Plan    PT Assessment Patient needs continued PT services  PT Problem List Decreased strength;Decreased activity tolerance;Decreased balance;Decreased mobility       PT Treatment Interventions DME instruction;Gait training;Functional mobility training;Therapeutic activities;Therapeutic exercise;Balance training;Cognitive remediation;Patient/family education    PT Goals (Current goals can be found in the Care Plan section)  Acute Rehab PT Goals Patient Stated Goal: go home PT Goal Formulation: With patient Time For Goal Achievement: 12/14/18 Potential to Achieve Goals: Good    Frequency Min 3X/week   Barriers to discharge        Co-evaluation               AM-PAC PT "6 Clicks" Mobility  Outcome Measure Help needed turning from your back to your side while in a flat bed without using bedrails?: None Help needed moving from lying on your back to sitting on the side of a flat bed without using bedrails?: None Help needed moving to and from a bed to a chair (including a wheelchair)?: None Help needed standing up from a chair using your arms (e.g., wheelchair or bedside chair)?: None Help needed to walk in hospital room?: A Little Help needed climbing 3-5 steps with a railing? : A Little 6 Click Score: 22    End of Session Equipment Utilized During Treatment: Gait belt Activity Tolerance: Patient tolerated treatment well Patient left: in bed;with call bell/phone within reach;with bed alarm set;with nursing/sitter in room Nurse Communication: Mobility status PT Visit Diagnosis: Unsteadiness on feet (R26.81);Other abnormalities of gait and mobility (R26.89);Muscle weakness (generalized) (M62.81);Difficulty in walking, not elsewhere classified (R26.2)    Time: 0802-0823 PT Time Calculation (min) (ACUTE ONLY): 21 min   Charges:   PT  Evaluation $PT Eval Moderate Complexity: 1 Mod         Ellamae Sia, [place], DPT Acute Rehabilitation Services Pager [phone] Office [phone]   Willy Eddy 11/30/2018, 8:31 AM

## 2018-11-30 NOTE — Evaluation (Signed)
Occupational Therapy Evaluation Patient Details Name: Maurice Bell MRN: [id number] DOB: 1955/12/11 Today's Date: 11/30/2018    History of Present Illness Pt is a 63 y.o. M with significant PMH of ESRD on hemodialysis, hypertension, stroke, HSV, who presents with myoclonic jerking movements and AMS with continued seizure for 1 hour. CT head was negative.   Clinical Impression   Pt is at independent baseline level of function with ADLs/selfcare and ADL mobility using no AD. All education completed and no further acute OT is indicate at this time    Follow Up Recommendations  No OT follow up    Equipment Recommendations  None recommended by OT    Recommendations for Other Services       Precautions / Restrictions Precautions Precautions: Fall Precaution Comments: hx of falling, seizures Restrictions Weight Bearing Restrictions: No      Mobility Bed Mobility Overal bed mobility: Independent                Transfers Overall transfer level: Independent Equipment used: None Transfers: Sit to/from Stand Sit to Stand: Independent              Balance Overall balance assessment: Needs assistance Sitting-balance support: Feet supported;No upper extremity supported Sitting balance-Leahy Scale: Good Sitting balance - Comments: able to reach outside BOS    Standing balance support: No upper extremity supported;During functional activity Standing balance-Leahy Scale: Good                             ADL either performed or assessed with clinical judgement   ADL Overall ADL's : At baseline;Independent                                             Vision Patient Visual Report: No change from baseline       Perception     Praxis      Pertinent Vitals/Pain Pain Assessment: 0-10 Pain Score: 3  Faces Pain Scale: Hurts little more Pain Location: neck Pain Descriptors / Indicators: Aching;Constant Pain Intervention(s):  Monitored during session;Repositioned;Premedicated before session     Hand Dominance Right   Extremity/Trunk Assessment Upper Extremity Assessment Upper Extremity Assessment: Overall WFL for tasks assessed RUE Deficits / Details: Strength 5/5, cervical pain with shoulder flexion LUE Deficits / Details: Strength 5/5, cervical pain with shoulder flexion   Lower Extremity Assessment Lower Extremity Assessment: Defer to PT evaluation RLE Deficits / Details: Strength 5/5 LLE Deficits / Details: Strength 5/5   Cervical / Trunk Assessment Cervical / Trunk Assessment: Other exceptions Cervical / Trunk Exceptions: Limited cervical AROM: right lateral flexion, extension   Communication Communication Communication: No difficulties   Cognition Arousal/Alertness: Awake/alert Behavior During Therapy: WFL for tasks assessed/performed Overall Cognitive Status: Within Functional Limits for tasks assessed                                     General Comments  VSS    Exercises     Shoulder Instructions      Home Living Family/patient expects to be discharged to:: Private residence Living Arrangements: Parent;Children Available Help at Discharge: Family;Available PRN/intermittently Type of Home: House Home Access: Level entry     Home Layout: One level     Bathroom Shower/Tub: Tub/shower  unit   Bathroom Toilet: Standard Bathroom Accessibility: No   Home Equipment: Walker - 2 wheels;Cane - single point          Prior Functioning/Environment Level of Independence: Independent        Comments: Drives to dialysis, 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        OT Problem List: Decreased activity tolerance;Pain      OT Treatment/Interventions:      OT Goals(Current goals can be found in the care plan section) Acute Rehab OT Goals Patient Stated Goal: go home OT Goal Formulation: With patient  OT Frequency:     Barriers to D/C:    no barriers        Co-evaluation              AM-PAC OT "6 Clicks" Daily Activity     Outcome Measure Help from another person eating meals?: None Help from another person taking care of personal grooming?: None Help from another person toileting, which includes using toliet, bedpan, or urinal?: None Help from another person bathing (including washing, rinsing, drying)?: None Help from another person to put on and taking off regular upper body clothing?: None Help from another person to put on and taking off regular lower body clothing?: None 6 Click Score: 24   End of Session Equipment Utilized During Treatment: Gait belt  Activity Tolerance: Patient tolerated treatment well Patient left: in bed;with call bell/phone within reach  OT Visit Diagnosis: Muscle weakness (generalized) (M62.81);Pain Pain - part of body: (neck/cervical area)                Time: 8832-5498 OT Time Calculation (min): 19 min Charges:  OT General Charges $OT Visit: 1 Visit OT Evaluation $OT Eval Moderate Complexity: 1 Mod    Britt Bottom 11/30/2018, 10:21 AM

## 2018-12-01 DIAGNOSIS — N2581 Secondary hyperparathyroidism of renal origin: Secondary | ICD-10-CM | POA: Diagnosis not present

## 2018-12-01 DIAGNOSIS — E875 Hyperkalemia: Secondary | ICD-10-CM | POA: Diagnosis not present

## 2018-12-01 DIAGNOSIS — N186 End stage renal disease: Secondary | ICD-10-CM | POA: Diagnosis not present

## 2018-12-01 DIAGNOSIS — D631 Anemia in chronic kidney disease: Secondary | ICD-10-CM | POA: Diagnosis not present

## 2018-12-01 NOTE — Discharge Summary (Signed)
Physician Discharge Summary  Maurice Bell NIO:[id number] DOB: 1956/01/10 DOA: 11/27/2018  PCP: [name], PA-C  Admit date: 11/27/2018 Discharge date: 12/01/2018  Admitted From: home  Disposition:  Home  Recommendations for Outpatient Follow-up:  LEFT AMA  Home Health:no Equipment/Devices:none  Discharge Condition:stable CODE STATUS:full Diet recommendation: Renal diet   Brief/Interim Summary: Maurice Bell is an 63 y.o. male past medical history of end-stage renal disease on hemodialysis, hypertension stroke HSV and thrombocytopenia presents with myoclonic jerking movements and altered mental status and continue seizure for 1 hour, with an alcohol level less than 10, potassium 6.1, bicarb 21 creatinine of 9.7, CT head was negative.  Discharge Diagnoses:  Principal Problem:   Uremia Active Problems:   HTN (hypertension)   Hyperkalemia   ESRD (end stage renal disease) on dialysis (HCC)   Myoclonic jerking   Acute metabolic encephalopathy   Hypoglycemia without diagnosis of diabetes mellitus   Anemia of chronic disease   Pancytopenia (HCC)   GERD (gastroesophageal reflux disease)   Depression   Abnormal EKG Uremia/end-stage renal disease on dialysis/hyperkalemia: Renal was consulted recommended dialysis he was dialyzed 2 days with significant improvement.  He was able to carry on a conversation and was able to make his own decisions in regards with his care. When he came out of dialysis on 11/30/2018 he related he wanted to leave [place] he was explained the risk and benefits of doing that and he understands.  Acute metabolic encephalopathy/myoclonic jerking: With similar presentations in the past likely due to missed dialysis in the setting of Lyrica and Requip use. CT scan of the head was unrevealing ABG showed no CO2 retention.  Essential hypertension: No changes were made.  Hyperglycemia without diabetes mellitus: Likely  reactive.  Anemia of chronic disease: Likely due to renal disease.  Macrocytosis: Who started on B12 and folate supplementation.   Discharge Instructions   Allergies as of 11/30/2018   No Known Allergies     Medication List    ASK your doctor about these medications   amLODipine 10 MG tablet Commonly known as:  NORVASC Take 1 tablet (10 mg total) by mouth at bedtime.   cloNIDine 0.1 MG tablet Commonly known as:  CATAPRES Take 1 tablet (0.1 mg total) by mouth 2 (two) times daily.   cyanocobalamin 2000 MCG tablet Take 1 tablet (2,000 mcg total) by mouth daily.   feeding supplement (NEPRO CARB STEADY) Liqd Take 237 mLs by mouth 2 (two) times daily between meals.   megestrol 625 MG/5ML suspension Commonly known as:  MEGACE ES Take 625 mg by mouth 3 (three) times daily with meals.   multivitamin Tabs tablet Take 1 tablet by mouth at bedtime.   pregabalin 25 MG capsule Commonly known as:  LYRICA Take 1 capsule (25 mg total) by mouth daily.   ranitidine 150 MG tablet Commonly known as:  ZANTAC Take 150 mg by mouth daily as needed for heartburn.   rOPINIRole 0.5 MG tablet Commonly known as:  REQUIP Take 1 tablet (0.5 mg total) by mouth daily.   sevelamer carbonate 800 MG tablet Commonly known as:  RENVELA Take 4 tablets (3,200 mg total) by mouth 3 (three) times daily with meals.   sildenafil 50 MG tablet Commonly known as:  VIAGRA Take 50 mg by mouth daily as needed for erectile dysfunction.   thiamine 100 MG tablet Take 1 tablet (100 mg total) by mouth daily.   UNABLE TO FIND BP monitor  Diagnosis: Hypertension   valACYclovir 500  MG tablet Commonly known as:  VALTREX Take 500 mg by mouth daily.       No Known Allergies  Consultations:  None   Procedures/Studies: Ct Head Wo Contrast  Result Date: 11/27/2018 CLINICAL DATA:  Altered LOC EXAM: CT HEAD WITHOUT CONTRAST TECHNIQUE: Contiguous axial images were obtained from the base of the skull  through the vertex without intravenous contrast. COMPARISON:  CT 11/02/2018, MRI 09/29/2018 FINDINGS: Brain: No acute territorial infarction, hemorrhage or intracranial mass. Chronic left cerebellar infarct. Mild atrophy. Stable ventricle size. Vascular: No hyperdense vessels.  No unexpected calcification Skull: Normal. Negative for fracture or focal lesion. Sinuses/Orbits: No acute finding. Other: None IMPRESSION: 1. No CT evidence for acute intracranial abnormality. 2. Stable atrophy and chronic left cerebellar infarct Electronically Signed   By: Donavan Foil M.D.   On: 11/27/2018 20:27   Ct Head Wo Contrast  Result Date: 11/02/2018 CLINICAL DATA:  Status post continuous seizures. Acute onset of neck pain. Concern for head injury. Initial encounter. EXAM: CT HEAD WITHOUT CONTRAST CT CERVICAL SPINE WITHOUT CONTRAST TECHNIQUE: Multidetector CT imaging of the head and cervical spine was performed following the standard protocol without intravenous contrast. Multiplanar CT image reconstructions of the cervical spine were also generated. COMPARISON:  CT of the head performed 10/06/2018; CT of the cervical spine, and MRI of the brain and cervical spine performed 09/29/2018. FINDINGS: CT HEAD FINDINGS Brain: No evidence of acute infarction, hemorrhage, hydrocephalus, extra-axial collection or mass lesion/mass effect. 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 The brainstem and fourth ventricle are within normal limits. The third and lateral ventricles, and basal ganglia are unremarkable in appearance. The cerebral hemispheres are symmetric in appearance, with normal gray-white differentiation. No mass effect or midline shift is seen. Vascular: No hyperdense vessel or unexpected calcification. Skull: There is no evidence of fracture; visualized osseous structures are unremarkable in appearance. Sinuses/Orbits: The orbits are within normal limits. A mucus retention cyst or polyp is noted at the  right maxillary sinus. The remaining paranasal sinuses and mastoid air cells are well-aerated. Other: No significant soft tissue abnormalities are seen. CT CERVICAL SPINE FINDINGS Alignment: Normal. Skull base and vertebrae: No acute fracture. Diffuse endplate abnormalities and underlying bony sclerosis are stable from the prior CT. This is most prominent at C3-C4, as on the prior study. There is mild multilevel loss of height along much of the cervical spine. Soft tissues and spinal canal: No prevertebral fluid or swelling. No visible canal hematoma. Disc levels: Multilevel disc space narrowing is noted, with diffuse endplate irregularity, particularly at C3-C4. Upper chest: The minimally visualized lung apices are clear. The visualized portions of the right thyroid lobe are grossly unremarkable in appearance. Other: No additional soft tissue abnormalities are seen. IMPRESSION: 1. No evidence of traumatic intracranial injury or fracture. 2. No evidence of fracture or subluxation along the cervical spine. 3. 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 4. Diffuse abnormality of the cervical spine is unchanged from November. 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 Osteomyelitis is considered less likely given apparent stability from November. 5. Mucus retention cyst or polyp at the right maxillary sinus. Electronically Signed   By: Garald Balding M.D.   On: 11/02/2018 22:15   Ct Cervical Spine Wo Contrast  Result Date: 11/02/2018 CLINICAL DATA:  Status post continuous seizures. Acute onset of neck pain. Concern for head injury. Initial encounter. EXAM: CT HEAD WITHOUT CONTRAST CT CERVICAL SPINE WITHOUT CONTRAST  TECHNIQUE: Multidetector CT imaging of the head and cervical spine was performed following the standard protocol without intravenous contrast. Multiplanar CT image reconstructions of the cervical spine were also  generated. COMPARISON:  CT of the head performed 10/06/2018; CT of the cervical spine, and MRI of the brain and cervical spine performed 09/29/2018. FINDINGS: CT HEAD FINDINGS Brain: No evidence of acute infarction, hemorrhage, hydrocephalus, extra-axial collection or mass lesion/mass effect. 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 The brainstem and fourth ventricle are within normal limits. The third and lateral ventricles, and basal ganglia are unremarkable in appearance. The cerebral hemispheres are symmetric in appearance, with normal gray-white differentiation. No mass effect or midline shift is seen. Vascular: No hyperdense vessel or unexpected calcification. Skull: There is no evidence of fracture; visualized osseous structures are unremarkable in appearance. Sinuses/Orbits: The orbits are within normal limits. A mucus retention cyst or polyp is noted at the right maxillary sinus. The remaining paranasal sinuses and mastoid air cells are well-aerated. Other: No significant soft tissue abnormalities are seen. CT CERVICAL SPINE FINDINGS Alignment: Normal. Skull base and vertebrae: No acute fracture. Diffuse endplate abnormalities and underlying bony sclerosis are stable from the prior CT. This is most prominent at C3-C4, as on the prior study. There is mild multilevel loss of height along much of the cervical spine. Soft tissues and spinal canal: No prevertebral fluid or swelling. No visible canal hematoma. Disc levels: Multilevel disc space narrowing is noted, with diffuse endplate irregularity, particularly at C3-C4. Upper chest: The minimally visualized lung apices are clear. The visualized portions of the right thyroid lobe are grossly unremarkable in appearance. Other: No additional soft tissue abnormalities are seen. IMPRESSION: 1. No evidence of traumatic intracranial injury or fracture. 2. No evidence of fracture or subluxation along the cervical spine. 3. Small chronic  infarcts at the left cerebellar hemisphere. 4. Diffuse abnormality of the cervical spine is unchanged from November. 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 Osteomyelitis is considered less likely given apparent stability from November. 5. Mucus retention cyst or polyp at the right maxillary sinus. Electronically Signed   By: Garald Balding M.D.   On: 11/02/2018 22:15   Dg Chest Portable 1 View  Result Date: 11/27/2018 CLINICAL DATA:  Altered mental status EXAM: PORTABLE CHEST 1 VIEW COMPARISON:  11/07/2018, 11/03/2018 FINDINGS: Left-sided central venous catheter tip overlies the proximal right atrium. Borderline to mild cardiomegaly. No consolidation or effusion. No pneumothorax. IMPRESSION: No active disease.  Mild cardiomegaly. Electronically Signed   By: Donavan Foil M.D.   On: 11/27/2018 20:16   Dg Chest Port 1 View  Result Date: 11/07/2018 CLINICAL DATA:  Diatek placement EXAM: PORTABLE CHEST 1 VIEW COMPARISON:  11/03/2018 FINDINGS: Placement central venous line without pneumothorax. Tip in the distal SVC. Lungs are clear. No acute osseous abnormality. IMPRESSION: Placement of central venous line without pneumothorax. Electronically Signed   By: Suzy Bouchard M.D.   On: 11/07/2018 10:43   Portable Chest X-ray  Result Date: 11/03/2018 CLINICAL DATA:  OG tube placement EXAM: PORTABLE CHEST 1 VIEW COMPARISON:  11/02/2018 FINDINGS: Endotracheal tube is 8 cm above the carina. NG tube enters the stomach. Heart is normal size. Lingular atelectasis. Right lung clear. No effusions or pneumothorax. No acute bony abnormality. IMPRESSION: Support devices as above. Lingular atelectasis. Electronically Signed   By: Rolm Baptise M.D.   On: 11/03/2018 12:04   Dg Chest Port 1 View  Result Date: 11/02/2018 CLINICAL  DATA:  Seizure, altered mental status EXAM: PORTABLE CHEST 1 VIEW COMPARISON:  10/27/2018 chest radiograph. FINDINGS: Stable  cardiomediastinal silhouette with borderline mild cardiomegaly. No pneumothorax. No pleural effusion. Lungs appear clear, with no acute consolidative airspace disease and no pulmonary edema. IMPRESSION: Stable borderline mild cardiomegaly without pulmonary edema. No active pulmonary disease. Electronically Signed   By: Ilona Sorrel M.D.   On: 11/02/2018 21:56   Vas [place] Upper Ext Vein Mapping (pre-op Avf)  Result Date: 11/06/2018 UPPER EXTREMITY VEIN MAPPING  Indications: Pre-operative for dialysis access. History: Non working, Bilateral radiocephalic AV fistuals.  Performing Technologist: Sharion Dove RVS  Examination Guidelines: A complete evaluation includes B-mode imaging, spectral Doppler, color Doppler, and power Doppler as needed of all accessible portions of each vessel. Bilateral testing is considered an integral part of a complete examination. Limited examinations for reoccurring indications may be performed as noted. +-----------------+-------------+----------+--------------+ Right Cephalic   Diameter (cm)Depth (cm)   Findings    +-----------------+-------------+----------+--------------+ Prox upper arm                          not visualized +-----------------+-------------+----------+--------------+ Mid upper arm                           not visualized +-----------------+-------------+----------+--------------+ Dist upper arm       0.16        0.14                  +-----------------+-------------+----------+--------------+ Antecubital fossa                       not visualized +-----------------+-------------+----------+--------------+ Prox forearm                            not visualized +-----------------+-------------+----------+--------------+ Mid forearm                             not visualized +-----------------+-------------+----------+--------------+ Dist forearm                            not visualized  +-----------------+-------------+----------+--------------+ Wrist                                   not visualized +-----------------+-------------+----------+--------------+ +-----------------+-------------+----------+---------+ Right Basilic    Diameter (cm)Depth (cm)Findings  +-----------------+-------------+----------+---------+ Mid upper arm        0.18        0.20             +-----------------+-------------+----------+---------+ Dist upper arm       0.32        0.49             +-----------------+-------------+----------+---------+ Antecubital fossa    0.32        0.29             +-----------------+-------------+----------+---------+ Prox forearm         0.36        0.22   branching +-----------------+-------------+----------+---------+ Mid forearm          0.31        0.23   branching +-----------------+-------------+----------+---------+ Distal forearm       0.24        0.19             +-----------------+-------------+----------+---------+  Wrist                0.17        0.20             +-----------------+-------------+----------+---------+ +-----------------+-------------+----------+--------------+ Left Cephalic    Diameter (cm)Depth (cm)   Findings    +-----------------+-------------+----------+--------------+ Prox upper arm                          not visualized +-----------------+-------------+----------+--------------+ Mid upper arm                           not visualized +-----------------+-------------+----------+--------------+ Dist upper arm                          not visualized +-----------------+-------------+----------+--------------+ Antecubital fossa                       not visualized +-----------------+-------------+----------+--------------+ Prox forearm                            not visualized +-----------------+-------------+----------+--------------+ Mid forearm                             not  visualized +-----------------+-------------+----------+--------------+ Wrist                                   not visualized +-----------------+-------------+----------+--------------+ +-----------------+-------------+----------+--------------+ Left Basilic     Diameter (cm)Depth (cm)   Findings    +-----------------+-------------+----------+--------------+ Prox upper arm                          not visualized +-----------------+-------------+----------+--------------+ Mid upper arm        0.12        0.16                  +-----------------+-------------+----------+--------------+ Dist upper arm       0.19        0.19                  +-----------------+-------------+----------+--------------+ Antecubital fossa    0.16        0.18                  +-----------------+-------------+----------+--------------+ Prox forearm         0.19        0.14                  +-----------------+-------------+----------+--------------+ Mid forearm          0.18        0.13                  +-----------------+-------------+----------+--------------+ Wrist                0.12        0.21                  +-----------------+-------------+----------+--------------+ *See table(s) above for measurements and observations.  Diagnosing physician: Ruta Hinds MD Electronically signed by Ruta Hinds MD on 11/06/2018 at 12:28:43 PM.    Final      Subjective: No complains  Discharge Exam: Vitals:   11/30/18 1555 11/30/18 1635  BP: 125/75 Marland Kitchen)  121/99  Pulse: 96 99  Resp: 18 19  Temp: (!) 96.9 F (36.1 C) 98.1 F (36.7 C)  SpO2: 100% 100%     General: Pt is alert, awake, not in acute distress Cardiovascular: RRR, S1/S2 +, no rubs, no gallops Respiratory: CTA bilaterally, no wheezing, no rhonchi Abdominal: Soft, NT, ND, bowel sounds + Extremities: no edema, no cyanosis    The results of significant diagnostics from this hospitalization (including imaging, microbiology,  ancillary and laboratory) are listed below for reference.     Microbiology: No results found for this or any previous visit (from the past 240 hour(s)).   Labs: BNP (last 3 results) No results for input(s): BNP in the last 8760 hours. Basic Metabolic Panel: Recent Labs  Lab 11/27/18 2032 11/28/18 0409 11/29/18 0255 11/30/18 1215  NA 141 142 136 136  K 6.1* 4.7 4.3 4.5  CL 103 104 97* 95*  CO2 23 24 23 24   GLUCOSE 79 84 97 89  BUN 37* 41* 17 48*  CREATININE 9.72* 10.54* 7.40* 11.24*  CALCIUM 8.9 8.5* 9.2 8.7*  PHOS  --   --   --  7.9*   Liver Function Tests: Recent Labs  Lab 11/27/18 2032 11/29/18 0255 11/30/18 1215  AST 44* 53*  --   ALT 33 43  --   ALKPHOS 88 63  --   BILITOT 1.2 0.9  --   PROT 7.7 7.4  --   ALBUMIN 4.0 3.8 3.7   No results for input(s): LIPASE, AMYLASE in the last 168 hours. Recent Labs  Lab 11/27/18 2032  AMMONIA 27   CBC: Recent Labs  Lab 11/27/18 2032 11/28/18 1221 11/29/18 0255 11/30/18 1215  WBC 3.5* 3.6* 4.3 3.2*  NEUTROABS 1.9  --   --   --   HGB 11.8* 11.2* 12.9* 11.6*  HCT 39.4 36.0* 40.2 37.1*  MCV 118.7* 114.6* 109.8* 112.4*  PLT 121* 124* 136* 131*   Cardiac Enzymes: Recent Labs  Lab 11/28/18 0108 11/28/18 0409  TROPONINI 0.03* 0.03*   BNP: Invalid input(s): POCBNP CBG: Recent Labs  Lab 11/29/18 0818 11/29/18 1113 11/29/18 1616 11/30/18 0000 11/30/18 1641  GLUCAP 93 98 107* 93 121*   D-Dimer No results for input(s): DDIMER in the last 72 hours. Hgb A1c No results for input(s): HGBA1C in the last 72 hours. Lipid Profile No results for input(s): CHOL, HDL, LDLCALC, TRIG, CHOLHDL, LDLDIRECT in the last 72 hours. Thyroid function studies No results for input(s): TSH, T4TOTAL, T3FREE, THYROIDAB in the last 72 hours.  Invalid input(s): FREET3 Anemia work up No results for input(s): VITAMINB12, FOLATE, FERRITIN, TIBC, IRON, RETICCTPCT in the last 72 hours. Urinalysis No results found for: COLORURINE,  APPEARANCEUR, LABSPEC, [place], GLUCOSEU, HGBUR, BILIRUBINUR, KETONESUR, PROTEINUR, UROBILINOGEN, NITRITE, LEUKOCYTESUR Sepsis Labs Invalid input(s): PROCALCITONIN,  WBC,  LACTICIDVEN Microbiology No results found for this or any previous visit (from the past 240 hour(s)).   Time coordinating discharge: 40 minutes  SIGNED:   Charlynne Cousins, MD  [facility]

## 2018-12-03 DIAGNOSIS — E875 Hyperkalemia: Secondary | ICD-10-CM | POA: Diagnosis not present

## 2018-12-03 DIAGNOSIS — D631 Anemia in chronic kidney disease: Secondary | ICD-10-CM | POA: Diagnosis not present

## 2018-12-03 DIAGNOSIS — Z23 Encounter for immunization: Secondary | ICD-10-CM | POA: Diagnosis not present

## 2018-12-03 DIAGNOSIS — Z992 Dependence on renal dialysis: Secondary | ICD-10-CM | POA: Diagnosis not present

## 2018-12-03 DIAGNOSIS — N186 End stage renal disease: Secondary | ICD-10-CM | POA: Diagnosis not present

## 2018-12-03 DIAGNOSIS — N2581 Secondary hyperparathyroidism of renal origin: Secondary | ICD-10-CM | POA: Diagnosis not present

## 2018-12-03 DIAGNOSIS — I129 Hypertensive chronic kidney disease with stage 1 through stage 4 chronic kidney disease, or unspecified chronic kidney disease: Secondary | ICD-10-CM | POA: Diagnosis not present

## 2018-12-06 DIAGNOSIS — E875 Hyperkalemia: Secondary | ICD-10-CM | POA: Diagnosis not present

## 2018-12-06 DIAGNOSIS — N2581 Secondary hyperparathyroidism of renal origin: Secondary | ICD-10-CM | POA: Diagnosis not present

## 2018-12-06 DIAGNOSIS — Z23 Encounter for immunization: Secondary | ICD-10-CM | POA: Diagnosis not present

## 2018-12-06 DIAGNOSIS — N186 End stage renal disease: Secondary | ICD-10-CM | POA: Diagnosis not present

## 2018-12-06 DIAGNOSIS — D631 Anemia in chronic kidney disease: Secondary | ICD-10-CM | POA: Diagnosis not present

## 2018-12-07 ENCOUNTER — Encounter (INDEPENDENT_AMBULATORY_CARE_PROVIDER_SITE_OTHER): Payer: Self-pay | Admitting: Primary Care

## 2018-12-07 ENCOUNTER — Ambulatory Visit (INDEPENDENT_AMBULATORY_CARE_PROVIDER_SITE_OTHER): Payer: Medicare Other | Admitting: Primary Care

## 2018-12-07 ENCOUNTER — Other Ambulatory Visit: Payer: Self-pay

## 2018-12-07 VITALS — BP 161/102 | HR 72 | Temp 97.9°F | Ht 65.0 in | Wt 142.2 lb

## 2018-12-07 DIAGNOSIS — M542 Cervicalgia: Secondary | ICD-10-CM

## 2018-12-07 DIAGNOSIS — N186 End stage renal disease: Secondary | ICD-10-CM

## 2018-12-07 DIAGNOSIS — Z992 Dependence on renal dialysis: Secondary | ICD-10-CM | POA: Diagnosis not present

## 2018-12-07 DIAGNOSIS — Z09 Encounter for follow-up examination after completed treatment for conditions other than malignant neoplasm: Secondary | ICD-10-CM | POA: Diagnosis not present

## 2018-12-07 NOTE — Progress Notes (Signed)
Subjective:  Patient ID: Maurice Bell, male    DOB: 07/26/1956  Age: 63 y.o. MRN: [id number]  CC: Hospitalization Follow-up (altered mental status )   HPI Maurice Bell presents for hospital follow up.He is a  63 y.o.male with a past medical history of end-stage renal disease on hemodialysis, hypertension stroke and seizure.  Outpatient Medications Prior to Visit  Medication Sig Dispense Refill  . amLODipine (NORVASC) 10 MG tablet Take 1 tablet (10 mg total) by mouth at bedtime. 30 tablet 5  . cloNIDine (CATAPRES) 0.1 MG tablet Take 1 tablet (0.1 mg total) by mouth 2 (two) times daily. 60 tablet 5  . megestrol (MEGACE ES) 625 MG/5ML suspension Take 625 mg by mouth 3 (three) times daily with meals.    . multivitamin (RENA-VIT) TABS tablet Take 1 tablet by mouth at bedtime. 30 tablet 3  . valACYclovir (VALTREX) 500 MG tablet Take 500 mg by mouth daily.  9  . Nutritional Supplements (FEEDING SUPPLEMENT, NEPRO CARB STEADY,) LIQD Take 237 mLs by mouth 2 (two) times daily between meals. (Patient not taking: Reported on 12/07/2018) 60 Can 0  . pregabalin (LYRICA) 25 MG capsule Take 1 capsule (25 mg total) by mouth daily. (Patient not taking: Reported on 12/07/2018) 30 capsule 1  . rOPINIRole (REQUIP) 0.5 MG tablet Take 1 tablet (0.5 mg total) by mouth daily. (Patient not taking: Reported on 12/07/2018) 30 tablet 3  . sevelamer carbonate (RENVELA) 800 MG tablet Take 4 tablets (3,200 mg total) by mouth 3 (three) times daily with meals. (Patient not taking: Reported on 12/07/2018) 180 tablet 5  . sildenafil (VIAGRA) 50 MG tablet Take 50 mg by mouth daily as needed for erectile dysfunction.    . thiamine 100 MG tablet Take 1 tablet (100 mg total) by mouth daily. (Patient not taking: Reported on 12/07/2018) 30 tablet 3  . UNABLE TO FIND BP monitor  Diagnosis: Hypertension (Patient not taking: Reported on 12/07/2018) 1 Mutually Defined 0  . vitamin B-12 2000 MCG tablet Take 1 tablet (2,000 mcg total) by  mouth daily. (Patient not taking: Reported on 12/07/2018) 30 tablet 3  . ranitidine (ZANTAC) 150 MG tablet Take 150 mg by mouth daily as needed for heartburn.      Facility-Administered Medications Prior to Visit  Medication Dose Route Frequency Provider Last Rate Last Dose  . sodium chloride flush (NS) 0.9 % injection 3 mL  3 mL Intravenous PRN Marty Heck, MD        ROS Review of Systems  Constitutional: Negative.   HENT: Negative.   Eyes: Negative.   Respiratory: Negative.   Cardiovascular: Negative.   Gastrointestinal: Negative.   Endocrine: Negative.   Genitourinary: Negative.   Musculoskeletal: Positive for neck pain.  Skin: Negative.   Allergic/Immunologic: Negative.   Neurological: Positive for seizures.  Hematological: Negative.   Psychiatric/Behavioral: Negative.     Objective:  BP (!) 161/102 (BP Location: Left Arm, Patient Position: Sitting, Cuff Size: Normal)   Pulse 72   Temp 97.9 F (36.6 C) (Oral)   Ht 5\' 5"  (1.651 m)   Wt 142 lb 3.2 oz (64.5 kg)   SpO2 97%   BMI 23.66 kg/m   BP/Weight 12/07/2018 11/30/2018 1/69/4503  Systolic BP 888 280 034  Diastolic BP 917 99 94  Wt. (Lbs) 142.2 133.16 128.6  BMI 23.66 22.16 21.4    Physical Exam HENT:     Head: Normocephalic.  Eyes:     Pupils: Pupils are equal, round, and reactive to  light.  Neck:     Musculoskeletal: Muscular tenderness present.  Cardiovascular:     Rate and Rhythm: Normal rate and regular rhythm.     Pulses: Normal pulses.     Heart sounds: Normal heart sounds.  Pulmonary:     Effort: Pulmonary effort is normal.     Breath sounds: Normal breath sounds.  Abdominal:     General: Bowel sounds are normal.  Musculoskeletal: Normal range of motion.  Skin:    General: Skin is warm.  Neurological:     Mental Status: He is alert and oriented to person, place, and time.  Psychiatric:        Mood and Affect: Mood normal.      Assessment & Plan:   There are no diagnoses linked to  this encounter.  No orders of the defined types were placed in this encounter.   Follow-up:  3 months for HTN   [name] NP

## 2018-12-08 DIAGNOSIS — N186 End stage renal disease: Secondary | ICD-10-CM | POA: Diagnosis not present

## 2018-12-08 DIAGNOSIS — D631 Anemia in chronic kidney disease: Secondary | ICD-10-CM | POA: Diagnosis not present

## 2018-12-08 DIAGNOSIS — N2581 Secondary hyperparathyroidism of renal origin: Secondary | ICD-10-CM | POA: Diagnosis not present

## 2018-12-08 DIAGNOSIS — E875 Hyperkalemia: Secondary | ICD-10-CM | POA: Diagnosis not present

## 2018-12-08 DIAGNOSIS — Z23 Encounter for immunization: Secondary | ICD-10-CM | POA: Diagnosis not present

## 2018-12-09 ENCOUNTER — Ambulatory Visit: Payer: Medicare Other | Admitting: Physical Therapy

## 2018-12-10 DIAGNOSIS — N2581 Secondary hyperparathyroidism of renal origin: Secondary | ICD-10-CM | POA: Diagnosis not present

## 2018-12-10 DIAGNOSIS — Z23 Encounter for immunization: Secondary | ICD-10-CM | POA: Diagnosis not present

## 2018-12-10 DIAGNOSIS — E875 Hyperkalemia: Secondary | ICD-10-CM | POA: Diagnosis not present

## 2018-12-10 DIAGNOSIS — D631 Anemia in chronic kidney disease: Secondary | ICD-10-CM | POA: Diagnosis not present

## 2018-12-10 DIAGNOSIS — N186 End stage renal disease: Secondary | ICD-10-CM | POA: Diagnosis not present

## 2018-12-13 ENCOUNTER — Telehealth: Payer: Self-pay | Admitting: Physical Therapy

## 2018-12-13 DIAGNOSIS — Z23 Encounter for immunization: Secondary | ICD-10-CM | POA: Diagnosis not present

## 2018-12-13 DIAGNOSIS — D631 Anemia in chronic kidney disease: Secondary | ICD-10-CM | POA: Diagnosis not present

## 2018-12-13 DIAGNOSIS — E875 Hyperkalemia: Secondary | ICD-10-CM | POA: Diagnosis not present

## 2018-12-13 DIAGNOSIS — N186 End stage renal disease: Secondary | ICD-10-CM | POA: Diagnosis not present

## 2018-12-13 DIAGNOSIS — N2581 Secondary hyperparathyroidism of renal origin: Secondary | ICD-10-CM | POA: Diagnosis not present

## 2018-12-13 NOTE — Telephone Encounter (Signed)
Spoke with pt requesting clearance for exercise from PCP since he was admitted to the hospital since referral was sent for PT. Pt verbalized understanding. Benny Henrie C. Oluwatamilore Starnes PT, DPT 12/13/18 6:27 PM

## 2018-12-14 ENCOUNTER — Telehealth (INDEPENDENT_AMBULATORY_CARE_PROVIDER_SITE_OTHER): Payer: Self-pay | Admitting: Orthopaedic Surgery

## 2018-12-14 ENCOUNTER — Ambulatory Visit: Payer: Medicare Other | Admitting: Physical Therapy

## 2018-12-14 NOTE — Telephone Encounter (Signed)
Patient called to state that they need a letter of clearance to do therapy today. [facility] on [address] is where patients order was sent.   Janett Billow told her to bring that letter to appt @11 :45    Please fax to [facility] asap to [phone]

## 2018-12-14 NOTE — Telephone Encounter (Signed)
I tried patient again, unable to leave message. If patient returns call, it appears PCP should give the clearance for PT.

## 2018-12-14 NOTE — Telephone Encounter (Signed)
I called patient but voice mailbox is not set up to leave message. Per note in chart, Maurice Bell requested clearance from patient's PCP in regards to PT since hospitalization.  Dr. Lorin Mercy would not be the one to clear the patient. Unable to advise. Will wait for return call.

## 2018-12-14 NOTE — Telephone Encounter (Signed)
Please see below. Sending to you due to me being out of the office. Thanks.

## 2018-12-15 ENCOUNTER — Telehealth (INDEPENDENT_AMBULATORY_CARE_PROVIDER_SITE_OTHER): Payer: Self-pay | Admitting: Orthopaedic Surgery

## 2018-12-15 DIAGNOSIS — E875 Hyperkalemia: Secondary | ICD-10-CM | POA: Diagnosis not present

## 2018-12-15 DIAGNOSIS — Z23 Encounter for immunization: Secondary | ICD-10-CM | POA: Diagnosis not present

## 2018-12-15 DIAGNOSIS — D631 Anemia in chronic kidney disease: Secondary | ICD-10-CM | POA: Diagnosis not present

## 2018-12-15 DIAGNOSIS — N186 End stage renal disease: Secondary | ICD-10-CM | POA: Diagnosis not present

## 2018-12-15 DIAGNOSIS — N2581 Secondary hyperparathyroidism of renal origin: Secondary | ICD-10-CM | POA: Diagnosis not present

## 2018-12-15 NOTE — Telephone Encounter (Signed)
Patient called to check the status of the referral for PT. I told him that Gwinda Passe tried to call on 12/14/18 but was unable to leave message. He states that Dr Lorin Mercy recommended him for physical therapy before he was hospitalized and he didn't go but he wants to go now.  Please call [phone]

## 2018-12-16 ENCOUNTER — Ambulatory Visit: Payer: Medicare Other | Attending: Orthopaedic Surgery | Admitting: Physical Therapy

## 2018-12-16 ENCOUNTER — Encounter: Payer: Self-pay | Admitting: Physical Therapy

## 2018-12-16 ENCOUNTER — Telehealth (INDEPENDENT_AMBULATORY_CARE_PROVIDER_SITE_OTHER): Payer: Self-pay

## 2018-12-16 VITALS — HR 87

## 2018-12-16 DIAGNOSIS — M6281 Muscle weakness (generalized): Secondary | ICD-10-CM

## 2018-12-16 DIAGNOSIS — R293 Abnormal posture: Secondary | ICD-10-CM | POA: Diagnosis not present

## 2018-12-16 DIAGNOSIS — M542 Cervicalgia: Secondary | ICD-10-CM
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
## 2018-12-16 NOTE — Telephone Encounter (Signed)
See other msg

## 2018-12-16 NOTE — Telephone Encounter (Signed)
Patient was called but I could not lvm. Will try again.

## 2018-12-16 NOTE — Telephone Encounter (Signed)
Patient called to check the status of the referral for PT. I told him that Gwinda Passe tried to call on 12/14/18 but was unable to leave message. He states that Dr Lorin Mercy recommended him for physical therapy before he was hospitalized and he didn't go but he wants to go now.  Please call [phone]

## 2018-12-16 NOTE — Telephone Encounter (Signed)
Will hold message, Pt did not answer and I could not lvm.

## 2018-12-17 DIAGNOSIS — Z23 Encounter for immunization: Secondary | ICD-10-CM | POA: Diagnosis not present

## 2018-12-17 DIAGNOSIS — D631 Anemia in chronic kidney disease: Secondary | ICD-10-CM | POA: Diagnosis not present

## 2018-12-17 DIAGNOSIS — E875 Hyperkalemia: Secondary | ICD-10-CM | POA: Diagnosis not present

## 2018-12-17 DIAGNOSIS — N2581 Secondary hyperparathyroidism of renal origin: Secondary | ICD-10-CM | POA: Diagnosis not present

## 2018-12-17 DIAGNOSIS — N186 End stage renal disease: Secondary | ICD-10-CM | POA: Diagnosis not present

## 2018-12-19 ENCOUNTER — Telehealth (INDEPENDENT_AMBULATORY_CARE_PROVIDER_SITE_OTHER): Payer: Self-pay

## 2018-12-19 NOTE — Telephone Encounter (Signed)
noted 

## 2018-12-19 NOTE — Telephone Encounter (Signed)
Patient has appt scheduled for 12/28/2018 with Dr Lorin Mercy. Could not reach pt several times. FYI

## 2018-12-19 NOTE — Telephone Encounter (Signed)
I tried to call pat again this morning, no answer and could not lvm.

## 2018-12-20 DIAGNOSIS — N2581 Secondary hyperparathyroidism of renal origin: Secondary | ICD-10-CM | POA: Diagnosis not present

## 2018-12-20 DIAGNOSIS — Z23 Encounter for immunization: Secondary | ICD-10-CM | POA: Diagnosis not present

## 2018-12-20 DIAGNOSIS — N186 End stage renal disease: Secondary | ICD-10-CM | POA: Diagnosis not present

## 2018-12-20 DIAGNOSIS — D631 Anemia in chronic kidney disease: Secondary | ICD-10-CM | POA: Diagnosis not present

## 2018-12-20 DIAGNOSIS — E875 Hyperkalemia: Secondary | ICD-10-CM | POA: Diagnosis not present

## 2018-12-22 DIAGNOSIS — N2581 Secondary hyperparathyroidism of renal origin: Secondary | ICD-10-CM | POA: Diagnosis not present

## 2018-12-22 DIAGNOSIS — Z23 Encounter for immunization: Secondary | ICD-10-CM | POA: Diagnosis not present

## 2018-12-22 DIAGNOSIS — D631 Anemia in chronic kidney disease: Secondary | ICD-10-CM | POA: Diagnosis not present

## 2018-12-22 DIAGNOSIS — N186 End stage renal disease: Secondary | ICD-10-CM | POA: Diagnosis not present

## 2018-12-22 DIAGNOSIS — E875 Hyperkalemia: Secondary | ICD-10-CM | POA: Diagnosis not present

## 2018-12-23 ENCOUNTER — Telehealth: Payer: Self-pay | Admitting: Physical Therapy

## 2018-12-23 ENCOUNTER — Ambulatory Visit: Payer: Medicare Other | Admitting: Physical Therapy

## 2018-12-23 NOTE — Telephone Encounter (Signed)
Spoke to patient regarding missed appointment. He forgot and he does plan to attend the next scheduled appointments. Confirmed next appointment date and time.

## 2018-12-24 DIAGNOSIS — N2581 Secondary hyperparathyroidism of renal origin: Secondary | ICD-10-CM | POA: Diagnosis not present

## 2018-12-24 DIAGNOSIS — E875 Hyperkalemia: Secondary | ICD-10-CM | POA: Diagnosis not present

## 2018-12-24 DIAGNOSIS — Z23 Encounter for immunization: Secondary | ICD-10-CM | POA: Diagnosis not present

## 2018-12-24 DIAGNOSIS — N186 End stage renal disease: Secondary | ICD-10-CM | POA: Diagnosis not present

## 2018-12-24 DIAGNOSIS — D631 Anemia in chronic kidney disease: Secondary | ICD-10-CM | POA: Diagnosis not present

## 2018-12-27 DIAGNOSIS — N2581 Secondary hyperparathyroidism of renal origin: Secondary | ICD-10-CM | POA: Diagnosis not present

## 2018-12-27 DIAGNOSIS — D631 Anemia in chronic kidney disease: Secondary | ICD-10-CM | POA: Diagnosis not present

## 2018-12-27 DIAGNOSIS — Z23 Encounter for immunization: Secondary | ICD-10-CM | POA: Diagnosis not present

## 2018-12-27 DIAGNOSIS — E875 Hyperkalemia: Secondary | ICD-10-CM | POA: Diagnosis not present

## 2018-12-27 DIAGNOSIS — N186 End stage renal disease: Secondary | ICD-10-CM | POA: Diagnosis not present

## 2018-12-28 ENCOUNTER — Ambulatory Visit: Payer: Medicare Other | Admitting: Physical Therapy

## 2018-12-28 ENCOUNTER — Ambulatory Visit (INDEPENDENT_AMBULATORY_CARE_PROVIDER_SITE_OTHER): Payer: Medicare Other | Admitting: Orthopaedic Surgery

## 2018-12-28 VITALS — BP 178/114

## 2018-12-28 DIAGNOSIS — R293 Abnormal posture: Secondary | ICD-10-CM

## 2018-12-28 DIAGNOSIS — M542 Cervicalgia: Secondary | ICD-10-CM | POA: Diagnosis not present

## 2018-12-28 DIAGNOSIS — M6281 Muscle weakness (generalized): Secondary | ICD-10-CM | POA: Diagnosis not present
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
## 2018-12-29 ENCOUNTER — Ambulatory Visit (INDEPENDENT_AMBULATORY_CARE_PROVIDER_SITE_OTHER): Payer: Medicare Other | Admitting: Surgery

## 2018-12-29 ENCOUNTER — Emergency Department (HOSPITAL_COMMUNITY)
Admission: EM | Admit: 2018-12-29 | Discharge: 2018-12-29 | Disposition: A | Discharge status: Home / Self Care | Payer: Medicare Other | Attending: Emergency Medicine | Admitting: Emergency Medicine

## 2018-12-29 ENCOUNTER — Emergency Department (HOSPITAL_COMMUNITY): Payer: Medicare Other

## 2018-12-29 ENCOUNTER — Encounter (HOSPITAL_COMMUNITY): Payer: Self-pay | Admitting: *Deleted

## 2018-12-29 ENCOUNTER — Other Ambulatory Visit: Payer: Self-pay

## 2018-12-29 DIAGNOSIS — Z87891 Personal history of nicotine dependence: Secondary | ICD-10-CM | POA: Insufficient documentation

## 2018-12-29 DIAGNOSIS — D631 Anemia in chronic kidney disease: Secondary | ICD-10-CM | POA: Diagnosis not present

## 2018-12-29 DIAGNOSIS — N2581 Secondary hyperparathyroidism of renal origin: Secondary | ICD-10-CM | POA: Diagnosis not present

## 2018-12-29 DIAGNOSIS — Z23 Encounter for immunization: Secondary | ICD-10-CM | POA: Diagnosis not present

## 2018-12-29 DIAGNOSIS — Z79899 Other long term (current) drug therapy: Secondary | ICD-10-CM | POA: Insufficient documentation

## 2018-12-29 DIAGNOSIS — Z992 Dependence on renal dialysis: Secondary | ICD-10-CM | POA: Insufficient documentation

## 2018-12-29 DIAGNOSIS — I10 Essential (primary) hypertension: Secondary | ICD-10-CM | POA: Diagnosis not present

## 2018-12-29 DIAGNOSIS — E875 Hyperkalemia: Secondary | ICD-10-CM | POA: Diagnosis not present

## 2018-12-29 DIAGNOSIS — E161 Other hypoglycemia: Secondary | ICD-10-CM | POA: Diagnosis not present

## 2018-12-29 DIAGNOSIS — N186 End stage renal disease: Secondary | ICD-10-CM | POA: Insufficient documentation

## 2018-12-29 DIAGNOSIS — R569 Unspecified convulsions: Secondary | ICD-10-CM | POA: Diagnosis not present

## 2018-12-29 DIAGNOSIS — E162 Hypoglycemia, unspecified: Secondary | ICD-10-CM | POA: Diagnosis not present

## 2018-12-29 DIAGNOSIS — G253 Myoclonus: Secondary | ICD-10-CM

## 2018-12-29 DIAGNOSIS — I120 Hypertensive chronic kidney disease with stage 5 chronic kidney disease or end stage renal disease: Secondary | ICD-10-CM | POA: Insufficient documentation

## 2018-12-29 DIAGNOSIS — G523 Disorders of hypoglossal nerve: Secondary | ICD-10-CM | POA: Diagnosis not present

## 2018-12-29 LAB — CBG MONITORING, ED
Glucose-Capillary: 128 mg/dL — ABNORMAL HIGH (ref 70–99)
Glucose-Capillary: 60 mg/dL — ABNORMAL LOW (ref 70–99)

## 2018-12-29 LAB — COMPREHENSIVE METABOLIC PANEL
ALT: 30 U/L (ref 0–44)
AST: 43 U/L — ABNORMAL HIGH (ref 15–41)
Albumin: 4.2 g/dL (ref 3.5–5.0)
Alkaline Phosphatase: 72 U/L (ref 38–126)
Anion gap: 19 — ABNORMAL HIGH (ref 5–15)
BUN: 47 mg/dL — ABNORMAL HIGH (ref 6–20)
CO2: 19 mmol/L — AB (ref 22–32)
Calcium: 9.6 mg/dL (ref 8.9–10.3)
Chloride: 91 mmol/L — ABNORMAL LOW (ref 98–111)
Creatinine, Ser: 12.01 mg/dL — ABNORMAL HIGH (ref 0.61–1.24)
GFR calc Af Amer: 5 mL/min — ABNORMAL LOW (ref >60)
GFR calc non Af Amer: 4 mL/min — ABNORMAL LOW (ref >60)
Glucose, Bld: 57 mg/dL — ABNORMAL LOW (ref 70–99)
Potassium: 5.5 mmol/L — ABNORMAL HIGH (ref 3.5–5.1)
SODIUM: 129 mmol/L — AB (ref 135–145)
Total Bilirubin: 0.3 mg/dL (ref 0.3–1.2)
Total Protein: 7.8 g/dL (ref 6.5–8.1)

## 2018-12-29 LAB — CBC WITH DIFFERENTIAL/PLATELET
ABS IMMATURE GRANULOCYTES: 0.01 10*3/uL (ref 0.00–0.07)
BASOS PCT: 1 %
Basophils Absolute: 0.1 10*3/uL (ref 0.0–0.1)
Eosinophils Absolute: 0.2 10*3/uL (ref 0.0–0.5)
Eosinophils Relative: 3 %
HCT: 53.3 % — ABNORMAL HIGH (ref 39.0–52.0)
Hemoglobin: 17.4 g/dL — ABNORMAL HIGH (ref 13.0–17.0)
Immature Granulocytes: 0 %
Lymphocytes Relative: 23 %
Lymphs Abs: 1.2 10*3/uL (ref 0.7–4.0)
MCH: 34.6 pg — ABNORMAL HIGH (ref 26.0–34.0)
MCHC: 32.6 g/dL (ref 30.0–36.0)
MCV: 106 fL — ABNORMAL HIGH (ref 80.0–100.0)
Monocytes Absolute: 0.7 10*3/uL (ref 0.1–1.0)
Monocytes Relative: 14 %
Neutro Abs: 3 10*3/uL (ref 1.7–7.7)
Neutrophils Relative %: 59 %
Platelets: 121 10*3/uL — ABNORMAL LOW (ref 150–400)
RBC: 5.03 MIL/uL (ref 4.22–5.81)
RDW: 14.3 % (ref 11.5–15.5)
WBC: 5.1 10*3/uL (ref 4.0–10.5)
nRBC: 0 % (ref 0.0–0.2)

## 2018-12-29 MED ORDER — DEXTROSE 50 % IV SOLN
1.0000 | Freq: Once | INTRAVENOUS | Status: AC
  Administered 2018-12-29: 50 mL via INTRAVENOUS
  Filled 2018-12-29: qty 50

## 2018-12-29 MED ORDER — CLONIDINE HCL 0.1 MG PO TABS
0.1000 mg | ORAL_TABLET | Freq: Two times a day (BID) | ORAL | Status: DC
  Administered 2018-12-29: 0.1 mg via ORAL
  Filled 2018-12-29: qty 1

## 2018-12-29 MED ORDER — METOPROLOL TARTRATE 25 MG PO TABS
150.0000 mg | ORAL_TABLET | Freq: Two times a day (BID) | ORAL | Status: DC
  Administered 2018-12-29: 150 mg via ORAL
  Filled 2018-12-29: qty 6

## 2018-12-29 MED ORDER — LABETALOL HCL 5 MG/ML IV SOLN
20.0000 mg | Freq: Once | INTRAVENOUS | Status: AC
  Administered 2018-12-29: 20 mg via INTRAVENOUS
  Filled 2018-12-29: qty 4

## 2018-12-29 MED ORDER — HYDRALAZINE HCL 25 MG PO TABS
50.0000 mg | ORAL_TABLET | Freq: Every day | ORAL | Status: DC
  Administered 2018-12-29: 50 mg via ORAL
  Filled 2018-12-29: qty 2

## 2018-12-29 MED ORDER — AMLODIPINE BESYLATE 5 MG PO TABS: 10.0000 mg | ORAL_TABLET | Freq: Every day | ORAL | Status: DC

## 2018-12-29 NOTE — ED Triage Notes (Signed)
The pt is c/o having a seizuretonight  He was taklen off seizure meds sometime he does not know when  Alert on arrival  He came from home by [place]

## 2018-12-29 NOTE — ED Provider Notes (Signed)
Patient care assumed from Joline Maxcy, PA-C at shift change with plan to f/u on CT head, chest x-ray, and consult neurology.   Physical Exam  BP (!) 185/121   Pulse 86   Temp 98.1 F (36.7 C) (Oral)   Resp 17   Ht 5\' 5"  (1.651 m)   Wt 65.8 kg   SpO2 100%   BMI 24.13 kg/m   Physical Exam Constitutional:      General: He is not in acute distress.    Appearance: He is well-developed.  Eyes:     Conjunctiva/sclera: Conjunctivae normal.  Cardiovascular:     Rate and Rhythm: Normal rate and regular rhythm.  Pulmonary:     Effort: Pulmonary effort is normal.     Breath sounds: Normal breath sounds.  Skin:    General: Skin is warm and dry.  Neurological:     Mental Status: He is alert and oriented to person, place, and time.     Comments: Mental Status:  Alert, thought content appropriate, able to give a coherent history. Speech fluent without evidence of aphasia. Able to follow 2 step commands without difficulty.  Cranial Nerves:  II:   pupils equal, round, reactive to light III,IV, VI: ptosis not present, extra-ocular motions intact bilaterally  V,VII: smile symmetric, facial light touch sensation equal VIII: hearing grossly normal to voice  X: uvula elevates symmetrically  XI: bilateral shoulder shrug symmetric and strong XII: midline tongue extension without fassiculations Motor:  Normal tone. 5/5 strength of BUE and BLE major muscle groups including strong and equal grip strength and dorsiflexion/plantar flexion Sensory: light touch normal in all extremities. Gait: normal gait and balance.      ED Course/Procedures     Procedures Results for orders placed or performed during the hospital encounter of 12/29/18  CBC with Differential/Platelet  Result Value Ref Range   WBC 5.1 4.0 - 10.5 K/uL   RBC 5.03 4.22 - 5.81 MIL/uL   Hemoglobin 17.4 (H) 13.0 - 17.0 g/dL   HCT 53.3 (H) 39.0 - 52.0 %   MCV 106.0 (H) 80.0 - 100.0 fL   MCH 34.6 (H) 26.0 - 34.0 pg   MCHC 32.6  30.0 - 36.0 g/dL   RDW 14.3 11.5 - 15.5 %   Platelets 121 (L) 150 - 400 K/uL   nRBC 0.0 0.0 - 0.2 %   Neutrophils Relative % 59 %   Neutro Abs 3.0 1.7 - 7.7 K/uL   Lymphocytes Relative 23 %   Lymphs Abs 1.2 0.7 - 4.0 K/uL   Monocytes Relative 14 %   Monocytes Absolute 0.7 0.1 - 1.0 K/uL   Eosinophils Relative 3 %   Eosinophils Absolute 0.2 0.0 - 0.5 K/uL   Basophils Relative 1 %   Basophils Absolute 0.1 0.0 - 0.1 K/uL   Immature Granulocytes 0 %   Abs Immature Granulocytes 0.01 0.00 - 0.07 K/uL  Comprehensive metabolic panel  Result Value Ref Range   Sodium 129 (L) 135 - 145 mmol/L   Potassium 5.5 (H) 3.5 - 5.1 mmol/L   Chloride 91 (L) 98 - 111 mmol/L   CO2 19 (L) 22 - 32 mmol/L   Glucose, Bld 57 (L) 70 - 99 mg/dL   BUN 47 (H) 6 - 20 mg/dL   Creatinine, Ser 12.01 (H) 0.61 - 1.24 mg/dL   Calcium 9.6 8.9 - 10.3 mg/dL   Total Protein 7.8 6.5 - 8.1 g/dL   Albumin 4.2 3.5 - 5.0 g/dL   AST 43 (H)  15 - 41 U/L   ALT 30 0 - 44 U/L   Alkaline Phosphatase 72 38 - 126 U/L   Total Bilirubin 0.3 0.3 - 1.2 mg/dL   GFR calc non Af Amer 4 (L) >60 mL/min   GFR calc Af Amer 5 (L) >60 mL/min   Anion gap 19 (H) 5 - 15  CBG monitoring, ED  Result Value Ref Range   Glucose-Capillary 60 (L) 70 - 99 mg/dL  POC CBG, ED  Result Value Ref Range   Glucose-Capillary 128 (H) 70 - 99 mg/dL   Dg Chest 2 View  Result Date: 12/29/2018 CLINICAL DATA:  Hypertension.  End-stage renal disease.  Seizure. EXAM: CHEST - 2 VIEW COMPARISON:  November 27, 2018 and November 02, 2018 FINDINGS: Central catheter tip is at the cavoatrial junction. No pneumothorax. There is no edema or consolidation. Heart is upper normal in size with pulmonary vascularity normal. Stable prominence in the right paratracheal region is likely due to great vessel prominence. Appearance in this area is stable. No bone lesions appreciable. No appreciable adenopathy. IMPRESSION: No edema or consolidation.  Stable cardiac silhouette. Electronically  Signed   By: Lowella Grip III M.D.   On: 12/29/2018 08:32   Ct Head Wo Contrast  Result Date: 12/29/2018 CLINICAL DATA:  Seizure EXAM: CT HEAD WITHOUT CONTRAST TECHNIQUE: Contiguous axial images were obtained from the base of the skull through the vertex without intravenous contrast. COMPARISON:  November 27, 2018 FINDINGS: Brain: The ventricles and sulci are within normal limits for age and stable. There is no intracranial mass, hemorrhage, extra-axial fluid collection, or midline shift. An infarct in the periphery of the superior left cerebellum posteriorly is stable. There is slight small vessel disease in the centra semiovale bilaterally. No acute infarct is demonstrable. Vascular: There is no hyperdense vessel. No appreciable arterial vascular calcification evident. Skull: The bony calvarium appears intact. Sinuses/Orbits: Visualized paranasal sinuses are clear. Orbits appear symmetric bilaterally. Other: Mastoid air cells are clear on the left. There is slight mastoid air cell disease on the right inferiorly. IMPRESSION: Slight periventricular small vessel disease. Prior infarct periphery of the superior left cerebellum posteriorly. No acute infarct. No mass or hemorrhage. Mild inferior mastoid air cell disease on the right. Electronically Signed   By: Lowella Grip III M.D.   On: 12/29/2018 08:34     MDM  Briefly, patient has a history of ESRD (on dialysis), CVA, and chronic intermittent myoclonic jerking.  He presented today for recurrent shaking episodes, feeling hot and cold and feeling like his equilibrium was off.  Later he admitted that his friend called EMS due to concern for possible seizure activity.  Patient has had similar symptoms in the past and has had thorough work-up by neurology in the past for the symptoms.  Symptoms seem to be associated with uremia and there has also been concerned that medications may be contributing therefore gabapentin and pregabalin have been  discontinued.  In the ED he was noted to initially have some jerking movements and rhythmic tongue movements.  On reassessment by prior provider his symptoms changed and he began stuttering and having a difficulty with word finding therefore CT head and chest x-ray were added.  He was noted to be hypoglycemic and an amp of D50 was ordered.  He was also hypertensive and was given a dose of labetalol.  Patient remainsed hypoglycemic after amp of D50 therefore additional amp of D50 was given and blood sugar improved to above 100.  He  remained hyper tensive after labetalol and therefore his home medications were also ordered.  At shift change, plan is to follow-up on results of CT head, chest x-ray, and reassess.  Plan for consulting neurology in regards to symptoms.  9:15 AM Evaluated pt after second amp of d50.  His speech is clear.  He is not stuttering.  He is not exhibiting any abnormal myoclonic jerking.  He is alert, answers questions appropriately and follows commands without difficulty.   CT scan of the head is negative for acute pathology.   Chest x-ray is negative.  9:23 AM Discussed case with Dr. Sandrea Matte who he recommends discontinuing pregabalin patient has not already done so. States that he is familiar with the patient and that patient has presented similarly in the past. He believes that his symptoms may be secondary to uremia. He recommends that the patient get dialysis today as scheduled and follow up with neurology as an outpatient in 2-4 weeks.   Informed pt of results and plan.  He states he scheduled for dialysis at 12 PM today.  He was given a cab voucher and discharged to go straight to dialysis for his appointment.  Have advised him to follow-up with neurology as recommended and to return the ER for new or worsening symptoms.  He voiced understanding of the plan and reasons to return.  Questions answered.  Case discussed with Dr. Wilson Singer who is in agreement with the plan for discharge.     Bishop Dublin 12/29/18 1702    Virgel Manifold, MD 12/30/18 1006

## 2018-12-29 NOTE — ED Notes (Signed)
Pt given taxi voucher for transfer to dialysis center, unable to get in touch with family. Pt has steady gait on disposition. Reviewed d/c paperwork and instructions with patient, opportunity for questions.
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
## 2018-12-29 NOTE — Discharge Instructions (Signed)
If you are still taking your lyrics (pregabalin) you need to discontinue this medication.  You need to go to dialysis today after being discharged from the emergency department.  You should follow up with neurology in 2-4 weeks for evaluation of your symptoms. They will contact you to make an appointment.  Please return to the emergency room for any new or worsening symptoms.

## 2018-12-30 ENCOUNTER — Ambulatory Visit: Payer: Medicare Other | Admitting: Physical Therapy

## 2018-12-30 ENCOUNTER — Encounter: Payer: Self-pay | Admitting: Physical Therapy

## 2018-12-30 VITALS — BP 164/98

## 2018-12-30 DIAGNOSIS — M6281 Muscle weakness (generalized): Secondary | ICD-10-CM | POA: Diagnosis not present

## 2018-12-30 DIAGNOSIS — R293 Abnormal posture: Secondary | ICD-10-CM

## 2018-12-30 DIAGNOSIS — M542 Cervicalgia: Secondary | ICD-10-CM | POA: Diagnosis not present
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
## 2018-12-30 NOTE — Patient Instructions (Signed)
Over Head Pull: Narrow Grip       On back, knees bent, feet flat, band across thighs, elbows straight but relaxed. Pull hands apart (start). Keeping elbows straight, bring arms up and over head, hands toward floor. Keep pull steady on band. Hold momentarily. Return slowly, keeping pull steady, back to start. Repeat _10x2__ times. Band color ___Y___   Side Pull: Double Arm   On back, knees bent, feet flat. Arms perpendicular to body, shoulder level, elbows straight but relaxed. Pull arms out to sides, elbows straight. Resistance band comes across collarbones, hands toward floor. Hold momentarily. Slowly return to starting position. Repeat 10X2 ___ times. Band color Y_____

## 2018-12-31 DIAGNOSIS — N2581 Secondary hyperparathyroidism of renal origin: Secondary | ICD-10-CM | POA: Diagnosis not present

## 2018-12-31 DIAGNOSIS — Z23 Encounter for immunization: Secondary | ICD-10-CM | POA: Diagnosis not present

## 2018-12-31 DIAGNOSIS — D631 Anemia in chronic kidney disease: Secondary | ICD-10-CM | POA: Diagnosis not present

## 2018-12-31 DIAGNOSIS — E875 Hyperkalemia: Secondary | ICD-10-CM | POA: Diagnosis not present

## 2018-12-31 DIAGNOSIS — N186 End stage renal disease: Secondary | ICD-10-CM | POA: Diagnosis not present

## 2019-01-01 DIAGNOSIS — Z992 Dependence on renal dialysis: Secondary | ICD-10-CM | POA: Diagnosis not present

## 2019-01-01 DIAGNOSIS — I129 Hypertensive chronic kidney disease with stage 1 through stage 4 chronic kidney disease, or unspecified chronic kidney disease: Secondary | ICD-10-CM | POA: Diagnosis not present

## 2019-01-01 DIAGNOSIS — N186 End stage renal disease: Secondary | ICD-10-CM | POA: Diagnosis not present

## 2019-01-02 ENCOUNTER — Ambulatory Visit: Payer: Medicare Other | Attending: Orthopaedic Surgery | Admitting: Physical Therapy

## 2019-01-02 DIAGNOSIS — M6281 Muscle weakness (generalized): Secondary | ICD-10-CM | POA: Insufficient documentation

## 2019-01-02 DIAGNOSIS — M542 Cervicalgia: Secondary | ICD-10-CM | POA: Insufficient documentation

## 2019-01-02 DIAGNOSIS — R293 Abnormal posture: Secondary | ICD-10-CM | POA: Insufficient documentation

## 2019-01-03 ENCOUNTER — Encounter (INDEPENDENT_AMBULATORY_CARE_PROVIDER_SITE_OTHER): Payer: Self-pay | Admitting: Orthopaedic Surgery

## 2019-01-03 ENCOUNTER — Ambulatory Visit (INDEPENDENT_AMBULATORY_CARE_PROVIDER_SITE_OTHER): Payer: Medicare Other | Admitting: Orthopaedic Surgery

## 2019-01-03 ENCOUNTER — Ambulatory Visit (INDEPENDENT_AMBULATORY_CARE_PROVIDER_SITE_OTHER): Payer: Medicare Other

## 2019-01-03 VITALS — BP 175/90 | HR 76 | Ht 65.0 in | Wt 145.0 lb

## 2019-01-03 DIAGNOSIS — N186 End stage renal disease: Secondary | ICD-10-CM | POA: Diagnosis not present

## 2019-01-03 DIAGNOSIS — M4722 Other spondylosis with radiculopathy, cervical region: Secondary | ICD-10-CM

## 2019-01-03 DIAGNOSIS — E875 Hyperkalemia: Secondary | ICD-10-CM | POA: Diagnosis not present

## 2019-01-03 DIAGNOSIS — N2581 Secondary hyperparathyroidism of renal origin: Secondary | ICD-10-CM | POA: Diagnosis not present

## 2019-01-03 DIAGNOSIS — M542 Cervicalgia: Secondary | ICD-10-CM | POA: Diagnosis not present
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
## 2019-01-04 ENCOUNTER — Ambulatory Visit: Payer: Medicare Other | Admitting: Physical Therapy

## 2019-01-04 DIAGNOSIS — M542 Cervicalgia: Secondary | ICD-10-CM | POA: Diagnosis not present

## 2019-01-04 DIAGNOSIS — M6281 Muscle weakness (generalized): Secondary | ICD-10-CM

## 2019-01-04 DIAGNOSIS — R293 Abnormal posture: Secondary | ICD-10-CM
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
## 2019-01-05 DIAGNOSIS — E875 Hyperkalemia: Secondary | ICD-10-CM | POA: Diagnosis not present

## 2019-01-05 DIAGNOSIS — N186 End stage renal disease: Secondary | ICD-10-CM | POA: Diagnosis not present

## 2019-01-05 DIAGNOSIS — N2581 Secondary hyperparathyroidism of renal origin: Secondary | ICD-10-CM | POA: Diagnosis not present

## 2019-01-07 DIAGNOSIS — E875 Hyperkalemia: Secondary | ICD-10-CM | POA: Diagnosis not present

## 2019-01-07 DIAGNOSIS — N186 End stage renal disease: Secondary | ICD-10-CM | POA: Diagnosis not present

## 2019-01-07 DIAGNOSIS — N2581 Secondary hyperparathyroidism of renal origin: Secondary | ICD-10-CM | POA: Diagnosis not present

## 2019-01-09 ENCOUNTER — Encounter: Payer: Self-pay | Admitting: Physical Therapy

## 2019-01-09 ENCOUNTER — Ambulatory Visit: Payer: Medicare Other | Admitting: Physical Therapy

## 2019-01-09 DIAGNOSIS — R293 Abnormal posture: Secondary | ICD-10-CM

## 2019-01-09 DIAGNOSIS — M6281 Muscle weakness (generalized): Secondary | ICD-10-CM | POA: Diagnosis not present

## 2019-01-09 DIAGNOSIS — M542 Cervicalgia: Secondary | ICD-10-CM | POA: Diagnosis not present
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
## 2019-01-10 DIAGNOSIS — N186 End stage renal disease: Secondary | ICD-10-CM | POA: Diagnosis not present

## 2019-01-10 DIAGNOSIS — E875 Hyperkalemia: Secondary | ICD-10-CM | POA: Diagnosis not present

## 2019-01-10 DIAGNOSIS — N2581 Secondary hyperparathyroidism of renal origin: Secondary | ICD-10-CM | POA: Diagnosis not present

## 2019-01-11 ENCOUNTER — Ambulatory Visit: Payer: Medicare Other | Admitting: Physical Therapy

## 2019-01-11 ENCOUNTER — Other Ambulatory Visit: Payer: Self-pay

## 2019-01-11 DIAGNOSIS — M6281 Muscle weakness (generalized): Secondary | ICD-10-CM | POA: Diagnosis not present

## 2019-01-11 DIAGNOSIS — R293 Abnormal posture: Secondary | ICD-10-CM | POA: Diagnosis not present

## 2019-01-11 DIAGNOSIS — M542 Cervicalgia: Secondary | ICD-10-CM | POA: Diagnosis not present
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
## 2019-01-12 DIAGNOSIS — N186 End stage renal disease: Secondary | ICD-10-CM | POA: Diagnosis not present

## 2019-01-12 DIAGNOSIS — E875 Hyperkalemia: Secondary | ICD-10-CM | POA: Diagnosis not present

## 2019-01-12 DIAGNOSIS — N2581 Secondary hyperparathyroidism of renal origin: Secondary | ICD-10-CM | POA: Diagnosis not present

## 2019-01-14 DIAGNOSIS — N2581 Secondary hyperparathyroidism of renal origin: Secondary | ICD-10-CM | POA: Diagnosis not present

## 2019-01-14 DIAGNOSIS — N186 End stage renal disease: Secondary | ICD-10-CM | POA: Diagnosis not present

## 2019-01-14 DIAGNOSIS — E875 Hyperkalemia: Secondary | ICD-10-CM | POA: Diagnosis not present

## 2019-01-16 ENCOUNTER — Ambulatory Visit (INDEPENDENT_AMBULATORY_CARE_PROVIDER_SITE_OTHER): Payer: Medicare Other | Admitting: Surgery

## 2019-01-16 ENCOUNTER — Other Ambulatory Visit: Payer: Self-pay | Admitting: *Deleted

## 2019-01-16 ENCOUNTER — Other Ambulatory Visit: Payer: Self-pay

## 2019-01-16 ENCOUNTER — Encounter: Payer: Self-pay | Admitting: Surgery

## 2019-01-16 ENCOUNTER — Encounter: Payer: Self-pay | Admitting: *Deleted

## 2019-01-16 VITALS — BP 166/98 | HR 75 | Temp 97.5°F | Resp 20 | Ht 65.0 in | Wt 134.0 lb

## 2019-01-16 DIAGNOSIS — Z992 Dependence on renal dialysis: Secondary | ICD-10-CM

## 2019-01-16 DIAGNOSIS — N186 End stage renal disease: Secondary | ICD-10-CM

## 2019-01-16 NOTE — H&P (View-Only) (Signed)
[facility]  [name] MRN: [id number] DOB: June 15, 1956 Sex: male   REQUESTING PROVIDER:    Dr. Joelyn Oms   REASON FOR CONSULT:    Dialysis access  HISTORY OF PRESENT ILLNESS:   Maurice Bell is a 63 y.o. male, who is referred for new dialysis access.  He is left-handed.  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  He had a catheter placed several months ago.  He dialyzes on Tuesday Thursday Saturday.  Patient is medically managed for hepatitis C.  He also is treated for hypertension.  He does have a history of stroke.  He reports some hematuria as well as orthopedic neck issues which are going to be addressed in the immediate future.  PAST MEDICAL HISTORY    Past Medical History:  Diagnosis Date  . Anemia   . Anxiety   . Depression   . ESRD (end stage renal disease) on dialysis ([place])    [place]; T[place]" (10/06/2018)  . Hepatitis C    "never treated" (10/06/2018)  . History of anemia due to CKD   . Hypertension   . Myoclonic jerking   . Stroke ([place])    bilat leg weakness residual     FAMILY HISTORY   Family History  Problem Relation Age of Onset  . High blood pressure Other     SOCIAL HISTORY:   Social History   Socioeconomic History  . Marital status: Single    Spouse name: Not on file  . Number of children: Not on file  . Years of education: Not on file  . Highest education level: Not on file  Occupational History  . Not on file  Social Needs  . Financial resource strain: Not on file  . Food insecurity:    Worry: Not on file    Inability: Not on file  . Transportation needs:    Medical: Not on file    Non-medical: Not on file  Tobacco Use  . Smoking status: Former Smoker    Types: Cigarettes  . Smokeless tobacco: Never Used  . Tobacco comment: "smoked as a kid"  Substance and Sexual Activity  . Alcohol use: Not  Currently  . Drug use: Yes    Types: Marijuana    Comment: 10/06/2018 "none since 1992"  . Sexual activity: Yes  Lifestyle  . Physical activity:    Days per week: Not on file    Minutes per session: Not on file  . Stress: Not on file  Relationships  . Social connections:    Talks on phone: Not on file    Gets together: Not on file    Attends religious service: Not on file    Active member of club or organization: Not on file    Attends meetings of clubs or organizations: Not on file    Relationship status: Not on file  . Intimate partner violence:    Fear of current or ex partner: Not on file    Emotionally abused: Not on file    Physically abused: Not on file    Forced sexual activity: Not on file  Other Topics Concern  . Not on file  Social History Narrative  . Not on file    ALLERGIES:    No Known Allergies  CURRENT MEDICATIONS:    Current Outpatient Medications  Medication Sig Dispense Refill  . amLODipine (NORVASC) 10 MG tablet Take 1 tablet (10 mg total)  by mouth at bedtime. 30 tablet 5  . cloNIDine (CATAPRES) 0.1 MG tablet Take 1 tablet (0.1 mg total) by mouth 2 (two) times daily. (Patient taking differently: Take 0.1 mg by mouth 3 (three) times daily. ) 60 tablet 5  . doxycycline (ADOXA) 100 MG tablet TAKE 1 TABLET BY MOUTH TWICE A DAY AS DIRECTED AT LEAST 2 HOURS BEFORE OR 2 HOURS AFTER BINDERS.    . hydrALAZINE (APRESOLINE) 50 MG tablet Take 50 mg by mouth daily.    . metoprolol tartrate (LOPRESSOR) 50 MG tablet Take 150 mg by mouth 2 (two) times daily.    . multivitamin (RENA-VIT) TABS tablet Take 1 tablet by mouth at bedtime. 30 tablet 3  . Nutritional Supplements (FEEDING SUPPLEMENT, NEPRO CARB STEADY,) LIQD Take 237 mLs by mouth 2 (two) times daily between meals. 60 Can 0  . sevelamer carbonate (RENVELA) 800 MG tablet Take 4 tablets (3,200 mg total) by mouth 3 (three) times daily with meals. 180 tablet 5  . UNABLE TO FIND BP monitor  Diagnosis:  Hypertension 1 Mutually Defined 0  . vitamin B-12 2000 MCG tablet Take 1 tablet (2,000 mcg total) by mouth daily. 30 tablet 3  . pregabalin (LYRICA) 25 MG capsule Take 1 capsule (25 mg total) by mouth daily. (Patient not taking: Reported on 12/29/2018) 30 capsule 1  . rOPINIRole (REQUIP) 0.5 MG tablet Take 1 tablet (0.5 mg total) by mouth daily. (Patient not taking: Reported on 12/29/2018) 30 tablet 3  . thiamine 100 MG tablet Take 1 tablet (100 mg total) by mouth daily. (Patient not taking: Reported on 01/16/2019) 30 tablet 3  . valACYclovir (VALTREX) 500 MG tablet Take 500 mg by mouth daily.  9   Current Facility-Administered Medications  Medication Dose Route Frequency Provider Last Rate Last Dose  . sodium chloride flush (NS) 0.9 % injection 3 mL  3 mL Intravenous PRN Marty Heck, MD        REVIEW OF SYSTEMS:   [X]  denotes positive finding, [ ]  denotes negative finding Cardiac  Comments:  Chest pain or chest pressure:    Shortness of breath upon exertion:    Short of breath when lying flat:    Irregular heart rhythm:        Vascular    Pain in calf, thigh, or hip brought on by ambulation: x   Pain in feet at night that wakes you up from your sleep:     Blood clot in your veins:    Leg swelling:         Pulmonary    Oxygen at home:    Productive cough:     Wheezing:         Neurologic    Sudden weakness in arms or legs:     Sudden numbness in arms or legs:     Sudden onset of difficulty speaking or slurred speech: x   Temporary loss of vision in one eye:     Problems with dizziness:         Gastrointestinal    Blood in stool:      Vomited blood:         Genitourinary    Burning when urinating:     Blood in urine: x       Psychiatric    Major depression:         Hematologic    Bleeding problems:    Problems with blood clotting too easily:  Skin    Rashes or ulcers:        Constitutional    Fever or chills:     PHYSICAL EXAM:   There were no  vitals filed for this visit.  GENERAL: The patient is a well-nourished male, in no acute distress. The vital signs are documented above. CARDIAC: There is a regular rate and rhythm.  VASCULAR: Palpable brachial pulse PULMONARY: Nonlabored respirations MUSCULOSKELETAL: There are no major deformities or cyanosis. NEUROLOGIC: No focal weakness or paresthesias are detected. SKIN: There are no ulcers or rashes noted. PSYCHIATRIC: The patient has a normal affect.  STUDIES:   I have reviewed his u/s with the following findings:  +-----------------+-------------+----------+--------------+ Right Cephalic   Diameter (cm)Depth (cm)   Findings    +-----------------+-------------+----------+--------------+ Prox upper arm                          not visualized +-----------------+-------------+----------+--------------+ Mid upper arm                           not visualized +-----------------+-------------+----------+--------------+ Dist upper arm       0.16        0.14                  +-----------------+-------------+----------+--------------+ Antecubital fossa                       not visualized +-----------------+-------------+----------+--------------+ Prox forearm                            not visualized +-----------------+-------------+----------+--------------+ Mid forearm                             not visualized +-----------------+-------------+----------+--------------+ Dist forearm                            not visualized +-----------------+-------------+----------+--------------+ Wrist                                   not visualized +-----------------+-------------+----------+--------------+  +-----------------+-------------+----------+---------+ Right Basilic    Diameter (cm)Depth (cm)Findings  +-----------------+-------------+----------+---------+ Mid upper arm        0.18        0.20              +-----------------+-------------+----------+---------+ Dist upper arm       0.32        0.49             +-----------------+-------------+----------+---------+ Antecubital fossa    0.32        0.29             +-----------------+-------------+----------+---------+ Prox forearm         0.36        0.22   branching +-----------------+-------------+----------+---------+ Mid forearm          0.31        0.23   branching +-----------------+-------------+----------+---------+ Distal forearm       0.24        0.19             +-----------------+-------------+----------+---------+ Wrist                0.17        0.20             +-----------------+-------------+----------+---------+  +-----------------+-------------+----------+--------------+  Left Cephalic    Diameter (cm)Depth (cm)   Findings    +-----------------+-------------+----------+--------------+ Prox upper arm                          not visualized +-----------------+-------------+----------+--------------+ Mid upper arm                           not visualized +-----------------+-------------+----------+--------------+ Dist upper arm                          not visualized +-----------------+-------------+----------+--------------+ Antecubital fossa                       not visualized +-----------------+-------------+----------+--------------+ Prox forearm                            not visualized +-----------------+-------------+----------+--------------+ Mid forearm                             not visualized +-----------------+-------------+----------+--------------+ Wrist                                   not visualized +-----------------+-------------+----------+--------------+  +-----------------+-------------+----------+--------------+ Left Basilic     Diameter (cm)Depth (cm)   Findings     +-----------------+-------------+----------+--------------+ Prox upper arm                          not visualized +-----------------+-------------+----------+--------------+ Mid upper arm        0.12        0.16                  +-----------------+-------------+----------+--------------+ Dist upper arm       0.19        0.19                  +-----------------+-------------+----------+--------------+ Antecubital fossa    0.16        0.18                  +-----------------+-------------+----------+--------------+ Prox forearm         0.19        0.14                  +-----------------+-------------+----------+--------------+ Mid forearm          0.18        0.13                  +-----------------+-------------+----------+--------------+ Wrist                0.12        0.21                  +-----------------+-------------+----------+--------------+  ASSESSMENT and PLAN   ESRD: We discussed placing a forearm versus upper arm graft as I doubt is veins are adequate.  I told him I would look at his basilic vein and potentially do a first-aid basilic vein fistula if I feel that it is an adequate conduit.  Risk benefits of the operation were discussed with patient.  All questions were answered.  He has been scheduled for Friday, March 20.   Leia Alf, MD, FACS [facility]  Tel [phone] Pager [phone]

## 2019-01-16 NOTE — Progress Notes (Signed)
[facility]  [name] MRN: [id number] DOB: [birth year] Sex: male   REQUESTING PROVIDER:    Dr. Joelyn Oms   REASON FOR CONSULT:    Dialysis access  HISTORY OF PRESENT ILLNESS:   Maurice Bell is a 63 y.o. male, who is referred for new dialysis access.  He is left-handed.  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  He had a catheter placed several months ago.  He dialyzes on Tuesday Thursday Saturday.  Patient is medically managed for hepatitis C.  He also is treated for hypertension.  He does have a history of stroke.  He reports some hematuria as well as orthopedic neck issues which are going to be addressed in the immediate future.  PAST MEDICAL HISTORY    Past Medical History:  Diagnosis Date  . Anemia   . Anxiety   . Depression   . ESRD (end stage renal disease) on dialysis ([place])    "[place]; TTS" (10/06/2018)  . Hepatitis C    "never treated" (10/06/2018)  . History of anemia due to CKD   . Hypertension   . Myoclonic jerking   . Stroke ([place])    bilat leg weakness residual     FAMILY HISTORY   Family History  Problem Relation Age of Onset  . High blood pressure Other     SOCIAL HISTORY:   Social History   Socioeconomic History  . Marital status: Single    Spouse name: Not on file  . Number of children: Not on file  . Years of education: Not on file  . Highest education level: Not on file  Occupational History  . Not on file  Social Needs  . Financial resource strain: Not on file  . Food insecurity:    Worry: Not on file    Inability: Not on file  . Transportation needs:    Medical: Not on file    Non-medical: Not on file  Tobacco Use  . Smoking status: Former Smoker    Types: Cigarettes  . Smokeless tobacco: Never Used  . Tobacco comment: "smoked as a kid"  Substance and Sexual Activity  . Alcohol use: Not  Currently  . Drug use: Yes    Types: Marijuana    Comment: 10/06/2018 "none since 1992"  . Sexual activity: Yes  Lifestyle  . Physical activity:    Days per week: Not on file    Minutes per session: Not on file  . Stress: Not on file  Relationships  . Social connections:    Talks on phone: Not on file    Gets together: Not on file    Attends religious service: Not on file    Active member of club or organization: Not on file    Attends meetings of clubs or organizations: Not on file    Relationship status: Not on file  . Intimate partner violence:    Fear of current or ex partner: Not on file    Emotionally abused: Not on file    Physically abused: Not on file    Forced sexual activity: Not on file  Other Topics Concern  . Not on file  Social History Narrative  . Not on file    ALLERGIES:    No Known Allergies  CURRENT MEDICATIONS:    Current Outpatient Medications  Medication Sig Dispense Refill  . amLODipine (NORVASC) 10 MG tablet Take 1 tablet (10 mg total)  by mouth at bedtime. 30 tablet 5  . cloNIDine (CATAPRES) 0.1 MG tablet Take 1 tablet (0.1 mg total) by mouth 2 (two) times daily. (Patient taking differently: Take 0.1 mg by mouth 3 (three) times daily. ) 60 tablet 5  . doxycycline (ADOXA) 100 MG tablet TAKE 1 TABLET BY MOUTH TWICE A DAY AS DIRECTED AT LEAST 2 HOURS BEFORE OR 2 HOURS AFTER BINDERS.    . hydrALAZINE (APRESOLINE) 50 MG tablet Take 50 mg by mouth daily.    . metoprolol tartrate (LOPRESSOR) 50 MG tablet Take 150 mg by mouth 2 (two) times daily.    . multivitamin (RENA-VIT) TABS tablet Take 1 tablet by mouth at bedtime. 30 tablet 3  . Nutritional Supplements (FEEDING SUPPLEMENT, NEPRO CARB STEADY,) LIQD Take 237 mLs by mouth 2 (two) times daily between meals. 60 Can 0  . sevelamer carbonate (RENVELA) 800 MG tablet Take 4 tablets (3,200 mg total) by mouth 3 (three) times daily with meals. 180 tablet 5  . UNABLE TO FIND BP monitor  Diagnosis:  Hypertension 1 Mutually Defined 0  . vitamin B-12 2000 MCG tablet Take 1 tablet (2,000 mcg total) by mouth daily. 30 tablet 3  . pregabalin (LYRICA) 25 MG capsule Take 1 capsule (25 mg total) by mouth daily. (Patient not taking: Reported on 12/29/2018) 30 capsule 1  . rOPINIRole (REQUIP) 0.5 MG tablet Take 1 tablet (0.5 mg total) by mouth daily. (Patient not taking: Reported on 12/29/2018) 30 tablet 3  . thiamine 100 MG tablet Take 1 tablet (100 mg total) by mouth daily. (Patient not taking: Reported on 01/16/2019) 30 tablet 3  . valACYclovir (VALTREX) 500 MG tablet Take 500 mg by mouth daily.  9   Current Facility-Administered Medications  Medication Dose Route Frequency Provider Last Rate Last Dose  . sodium chloride flush (NS) 0.9 % injection 3 mL  3 mL Intravenous PRN Marty Heck, MD        REVIEW OF SYSTEMS:   [X]  denotes positive finding, [ ]  denotes negative finding Cardiac  Comments:  Chest pain or chest pressure:    Shortness of breath upon exertion:    Short of breath when lying flat:    Irregular heart rhythm:        Vascular    Pain in calf, thigh, or hip brought on by ambulation: x   Pain in feet at night that wakes you up from your sleep:     Blood clot in your veins:    Leg swelling:         Pulmonary    Oxygen at home:    Productive cough:     Wheezing:         Neurologic    Sudden weakness in arms or legs:     Sudden numbness in arms or legs:     Sudden onset of difficulty speaking or slurred speech: x   Temporary loss of vision in one eye:     Problems with dizziness:         Gastrointestinal    Blood in stool:      Vomited blood:         Genitourinary    Burning when urinating:     Blood in urine: x       Psychiatric    Major depression:         Hematologic    Bleeding problems:    Problems with blood clotting too easily:  Skin    Rashes or ulcers:        Constitutional    Fever or chills:     PHYSICAL EXAM:   There were no  vitals filed for this visit.  GENERAL: The patient is a well-nourished male, in no acute distress. The vital signs are documented above. CARDIAC: There is a regular rate and rhythm.  VASCULAR: Palpable brachial pulse PULMONARY: Nonlabored respirations MUSCULOSKELETAL: There are no major deformities or cyanosis. NEUROLOGIC: No focal weakness or paresthesias are detected. SKIN: There are no ulcers or rashes noted. PSYCHIATRIC: The patient has a normal affect.  STUDIES:   I have reviewed his u/s with the following findings:  +-----------------+-------------+----------+--------------+ Right Cephalic   Diameter (cm)Depth (cm)   Findings    +-----------------+-------------+----------+--------------+ Prox upper arm                          not visualized +-----------------+-------------+----------+--------------+ Mid upper arm                           not visualized +-----------------+-------------+----------+--------------+ Dist upper arm       0.16        0.14                  +-----------------+-------------+----------+--------------+ Antecubital fossa                       not visualized +-----------------+-------------+----------+--------------+ Prox forearm                            not visualized +-----------------+-------------+----------+--------------+ Mid forearm                             not visualized +-----------------+-------------+----------+--------------+ Dist forearm                            not visualized +-----------------+-------------+----------+--------------+ Wrist                                   not visualized +-----------------+-------------+----------+--------------+  +-----------------+-------------+----------+---------+ Right Basilic    Diameter (cm)Depth (cm)Findings  +-----------------+-------------+----------+---------+ Mid upper arm        0.18        0.20              +-----------------+-------------+----------+---------+ Dist upper arm       0.32        0.49             +-----------------+-------------+----------+---------+ Antecubital fossa    0.32        0.29             +-----------------+-------------+----------+---------+ Prox forearm         0.36        0.22   branching +-----------------+-------------+----------+---------+ Mid forearm          0.31        0.23   branching +-----------------+-------------+----------+---------+ Distal forearm       0.24        0.19             +-----------------+-------------+----------+---------+ Wrist                0.17        0.20             +-----------------+-------------+----------+---------+  +-----------------+-------------+----------+--------------+  Left Cephalic    Diameter (cm)Depth (cm)   Findings    +-----------------+-------------+----------+--------------+ Prox upper arm                          not visualized +-----------------+-------------+----------+--------------+ Mid upper arm                           not visualized +-----------------+-------------+----------+--------------+ Dist upper arm                          not visualized +-----------------+-------------+----------+--------------+ Antecubital fossa                       not visualized +-----------------+-------------+----------+--------------+ Prox forearm                            not visualized +-----------------+-------------+----------+--------------+ Mid forearm                             not visualized +-----------------+-------------+----------+--------------+ Wrist                                   not visualized +-----------------+-------------+----------+--------------+  +-----------------+-------------+----------+--------------+ Left Basilic     Diameter (cm)Depth (cm)   Findings     +-----------------+-------------+----------+--------------+ Prox upper arm                          not visualized +-----------------+-------------+----------+--------------+ Mid upper arm        0.12        0.16                  +-----------------+-------------+----------+--------------+ Dist upper arm       0.19        0.19                  +-----------------+-------------+----------+--------------+ Antecubital fossa    0.16        0.18                  +-----------------+-------------+----------+--------------+ Prox forearm         0.19        0.14                  +-----------------+-------------+----------+--------------+ Mid forearm          0.18        0.13                  +-----------------+-------------+----------+--------------+ Wrist                0.12        0.21                  +-----------------+-------------+----------+--------------+  ASSESSMENT and PLAN   ESRD: We discussed placing a forearm versus upper arm graft as I doubt is veins are adequate.  I told him I would look at his basilic vein and potentially do a first-aid basilic vein fistula if I feel that it is an adequate conduit.  Risk benefits of the operation were discussed with patient.  All questions were answered.  He has been scheduled for Friday, March 20.   Leia Alf, MD, FACS [facility]  Tel [phone] Pager [phone]

## 2019-01-17 DIAGNOSIS — E875 Hyperkalemia: Secondary | ICD-10-CM | POA: Diagnosis not present

## 2019-01-17 DIAGNOSIS — N186 End stage renal disease: Secondary | ICD-10-CM | POA: Diagnosis not present

## 2019-01-17 DIAGNOSIS — N2581 Secondary hyperparathyroidism of renal origin: Secondary | ICD-10-CM | POA: Diagnosis not present

## 2019-01-19 ENCOUNTER — Encounter (HOSPITAL_COMMUNITY): Payer: Self-pay | Admitting: *Deleted

## 2019-01-19 ENCOUNTER — Other Ambulatory Visit: Payer: Self-pay

## 2019-01-19 DIAGNOSIS — N2581 Secondary hyperparathyroidism of renal origin: Secondary | ICD-10-CM | POA: Diagnosis not present

## 2019-01-19 DIAGNOSIS — E875 Hyperkalemia: Secondary | ICD-10-CM | POA: Diagnosis not present

## 2019-01-19 DIAGNOSIS — N186 End stage renal disease: Secondary | ICD-10-CM | POA: Diagnosis not present

## 2019-01-19 NOTE — Progress Notes (Signed)
   01/19/19 0951  OBSTRUCTIVE SLEEP APNEA  Have you ever been diagnosed with sleep apnea through a sleep study? No  Do you snore loudly (loud enough to be heard through closed doors)?  1  Do you often feel tired, fatigued, or sleepy during the daytime (such as falling asleep during driving or talking to someone)? 1  Has anyone observed you stop breathing during your sleep? 1  Do you have, or are you being treated for high blood pressure? 1  BMI more than 35 kg/m2? 0  Age > 50 (1-yes) 1  Neck circumference greater than:Male 16 inches or larger, Male 17inches or larger? 0  Male Gender (Yes=1) 1  Obstructive Sleep Apnea Score 6

## 2019-01-19 NOTE — Progress Notes (Signed)
Pt denies SOB, chest pain, and being under the care of a cardiologist. Pt denies having an echo but stated that a cardiac cath and stress test were both performed > 5 years ago. Pt stated " I don't remember where it was done at, [place] and I had a stroke around 2011 " when nurse asked pt where his cardiac studies were performed at. Pt made aware to stop taking vitamins, fish oil and herbal medications. Do not take any NSAIDs ie: Ibuprofen, Advil, Naproxen (Aleve), Motrin, BC and Goody Powder. Pt verbalized understanding of all pre-op instructions. PA, Anesthesiology asked to review pt history. Coronavirus Screening for both pt and his sister Myra: Pt denies that both he and his sister have experienced the following symptoms:  Cough yes/no: No Fever (>100.28F)  yes/no: No Runny nose yes/no: No Sore throat yes/no: No Difficulty breathing/shortness of breath  yes/no: No  Have you or a family member traveled in the last 14 days and where? yes/no: No

## 2019-01-20 ENCOUNTER — Ambulatory Visit (HOSPITAL_COMMUNITY): Admission: RE | Admit: 2019-01-20 | Payer: Medicare Other | Source: Home / Self Care | Admitting: Surgery

## 2019-01-20 HISTORY — DX: Presence of dental prosthetic device (complete) (partial): Z97.2

## 2019-01-20 HISTORY — DX: Presence of spectacles and contact lenses: Z97.3

## 2019-01-20 HISTORY — DX: Unspecified osteoarthritis, unspecified site: M19.90

## 2019-01-20 HISTORY — DX: Gastro-esophageal reflux disease without esophagitis: K21.9

## 2019-01-20 SURGERY — ARTERIOVENOUS (AV) FISTULA CREATION
Anesthesia: Monitor Anesthesia Care | Laterality: Right

## 2019-01-22 ENCOUNTER — Other Ambulatory Visit: Payer: Self-pay

## 2019-01-22 ENCOUNTER — Non-Acute Institutional Stay (HOSPITAL_COMMUNITY)
Admission: EM | Admit: 2019-01-22 | Discharge: 2019-01-23 | Disposition: A | Discharge status: Home / Self Care | Payer: Medicare Other | Attending: Emergency Medicine | Admitting: Emergency Medicine

## 2019-01-22 ENCOUNTER — Encounter (HOSPITAL_COMMUNITY): Payer: Self-pay | Admitting: Emergency Medicine

## 2019-01-22 DIAGNOSIS — Z992 Dependence on renal dialysis: Secondary | ICD-10-CM | POA: Insufficient documentation

## 2019-01-22 DIAGNOSIS — M542 Cervicalgia: Secondary | ICD-10-CM | POA: Diagnosis not present

## 2019-01-22 DIAGNOSIS — E875 Hyperkalemia: Secondary | ICD-10-CM | POA: Diagnosis present

## 2019-01-22 DIAGNOSIS — E876 Hypokalemia: Secondary | ICD-10-CM | POA: Diagnosis not present

## 2019-01-22 DIAGNOSIS — R319 Hematuria, unspecified: Secondary | ICD-10-CM | POA: Diagnosis not present

## 2019-01-22 DIAGNOSIS — M546 Pain in thoracic spine: Secondary | ICD-10-CM | POA: Diagnosis not present

## 2019-01-22 DIAGNOSIS — M4722 Other spondylosis with radiculopathy, cervical region: Secondary | ICD-10-CM | POA: Insufficient documentation

## 2019-01-22 DIAGNOSIS — K219 Gastro-esophageal reflux disease without esophagitis: Secondary | ICD-10-CM | POA: Insufficient documentation

## 2019-01-22 DIAGNOSIS — G8929 Other chronic pain: Secondary | ICD-10-CM

## 2019-01-22 DIAGNOSIS — Z79899 Other long term (current) drug therapy: Secondary | ICD-10-CM | POA: Diagnosis not present

## 2019-01-22 DIAGNOSIS — I120 Hypertensive chronic kidney disease with stage 5 chronic kidney disease or end stage renal disease: Secondary | ICD-10-CM | POA: Insufficient documentation

## 2019-01-22 DIAGNOSIS — Z87891 Personal history of nicotine dependence: Secondary | ICD-10-CM | POA: Diagnosis not present

## 2019-01-22 DIAGNOSIS — D631 Anemia in chronic kidney disease: Secondary | ICD-10-CM | POA: Insufficient documentation

## 2019-01-22 DIAGNOSIS — Z8673 Personal history of transient ischemic attack (TIA), and cerebral infarction without residual deficits: Secondary | ICD-10-CM | POA: Insufficient documentation

## 2019-01-22 DIAGNOSIS — F419 Anxiety disorder, unspecified: Secondary | ICD-10-CM | POA: Insufficient documentation

## 2019-01-22 DIAGNOSIS — B1920 Unspecified viral hepatitis C without hepatic coma: Secondary | ICD-10-CM | POA: Insufficient documentation

## 2019-01-22 DIAGNOSIS — N186 End stage renal disease: Secondary | ICD-10-CM | POA: Diagnosis not present

## 2019-01-22 DIAGNOSIS — M545 Low back pain: Secondary | ICD-10-CM | POA: Diagnosis not present

## 2019-01-22 DIAGNOSIS — F329 Major depressive disorder, single episode, unspecified: Secondary | ICD-10-CM | POA: Diagnosis not present

## 2019-01-22 DIAGNOSIS — G253 Myoclonus: Secondary | ICD-10-CM | POA: Diagnosis not present

## 2019-01-22 DIAGNOSIS — R410 Disorientation, unspecified: Secondary | ICD-10-CM | POA: Diagnosis not present

## 2019-01-22 DIAGNOSIS — M549 Dorsalgia, unspecified: Secondary | ICD-10-CM

## 2019-01-22 LAB — CBC WITH DIFFERENTIAL/PLATELET
Abs Immature Granulocytes: 0.01 10*3/uL (ref 0.00–0.07)
BASOS PCT: 1 %
Basophils Absolute: 0 10*3/uL (ref 0.0–0.1)
Eosinophils Absolute: 0.3 10*3/uL (ref 0.0–0.5)
Eosinophils Relative: 7 %
HCT: 42.3 % (ref 39.0–52.0)
Hemoglobin: 13.8 g/dL (ref 13.0–17.0)
Immature Granulocytes: 0 %
Lymphocytes Relative: 26 %
Lymphs Abs: 1 10*3/uL (ref 0.7–4.0)
MCH: 33.8 pg (ref 26.0–34.0)
MCHC: 32.6 g/dL (ref 30.0–36.0)
MCV: 103.7 fL — ABNORMAL HIGH (ref 80.0–100.0)
Monocytes Absolute: 0.4 10*3/uL (ref 0.1–1.0)
Monocytes Relative: 10 %
Neutro Abs: 2.1 10*3/uL (ref 1.7–7.7)
Neutrophils Relative %: 56 %
Platelets: 137 10*3/uL — ABNORMAL LOW (ref 150–400)
RBC: 4.08 MIL/uL — ABNORMAL LOW (ref 4.22–5.81)
RDW: 14.1 % (ref 11.5–15.5)
WBC: 3.8 10*3/uL — AB (ref 4.0–10.5)
nRBC: 0 % (ref 0.0–0.2)

## 2019-01-22 LAB — COMPREHENSIVE METABOLIC PANEL
ALT: 36 U/L (ref 0–44)
AST: 37 U/L (ref 15–41)
Albumin: 3.9 g/dL (ref 3.5–5.0)
Alkaline Phosphatase: 81 U/L (ref 38–126)
Anion gap: 13 (ref 5–15)
BUN: 62 mg/dL — ABNORMAL HIGH (ref 6–20)
CO2: 20 mmol/L — ABNORMAL LOW (ref 22–32)
Calcium: 9.5 mg/dL (ref 8.9–10.3)
Chloride: 103 mmol/L (ref 98–111)
Creatinine, Ser: 12.71 mg/dL — ABNORMAL HIGH (ref 0.61–1.24)
GFR calc Af Amer: 4 mL/min — ABNORMAL LOW (ref >60)
GFR calc non Af Amer: 4 mL/min — ABNORMAL LOW (ref >60)
Glucose, Bld: 96 mg/dL (ref 70–99)
Potassium: 6.4 mmol/L (ref 3.5–5.1)
Sodium: 136 mmol/L (ref 135–145)
Total Bilirubin: 0.4 mg/dL (ref 0.3–1.2)
Total Protein: 7.3 g/dL (ref 6.5–8.1)

## 2019-01-22 MED ORDER — CLONIDINE HCL 0.1 MG PO TABS
0.1000 mg | ORAL_TABLET | Freq: Once | ORAL | Status: AC
  Administered 2019-01-23: 0.1 mg via ORAL
  Filled 2019-01-22: qty 1

## 2019-01-22 MED ORDER — HYDROCODONE-ACETAMINOPHEN 5-325 MG PO TABS: 0.5000 | ORAL_TABLET | Freq: Two times a day (BID) | ORAL | 0 refills | Status: DC | PRN

## 2019-01-22 MED ORDER — CHLORHEXIDINE GLUCONATE CLOTH 2 % EX PADS: 6.0000 | MEDICATED_PAD | Freq: Every day | CUTANEOUS | Status: DC

## 2019-01-22 MED ORDER — HYDROMORPHONE HCL 1 MG/ML IJ SOLN
1.0000 mg | Freq: Once | INTRAMUSCULAR | Status: AC
  Administered 2019-01-22: 1 mg via INTRAVENOUS
  Filled 2019-01-22: qty 1

## 2019-01-22 MED ORDER — HYDROMORPHONE HCL 1 MG/ML IJ SOLN
0.5000 mg | Freq: Once | INTRAMUSCULAR | Status: AC
  Administered 2019-01-22: 0.5 mg via INTRAVENOUS
  Filled 2019-01-22: qty 1

## 2019-01-22 NOTE — Progress Notes (Signed)
Pt seen and examined.  Asked to see for HD.  Patient came to ED for severe back and neck pain, an acute on chronic issue.  Pt does not require admission for this but labs showed K 6.3 and we are asked to assess for high K+.  Pt is having severe neck pain, awaiting pain medication here, but denies any N/V, SOB or cough or CP, no fevers. States he doesn't "miss any dialysis", and his last Rx was yesterday/ Saturday. On TTS schedule.  On exam lungs clear, no jvd, heart reg w/o gallop and abd soft, no LE edema.  Has L IJ TDC, his fistula recently "gave out" and he's without permanent access at this time.    Plan is for 3 hr HD w/ low K+ bath and pt will be dc'd home from HD unit post-HD.   [facility] 01/22/2019, 6:36 PM

## 2019-01-22 NOTE — ED Notes (Signed)
Last dialysis one day ago. Has dialysis T,Th,Sat.
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
## 2019-01-22 NOTE — ED Notes (Signed)
Lab called critical value potassium 6.4 reported to provider.

## 2019-01-22 NOTE — ED Notes (Signed)
Dialysis called, ready for pt.

## 2019-01-22 NOTE — ED Notes (Signed)
To dialysis

## 2019-01-22 NOTE — Procedures (Signed)
   I was present at this dialysis session, have reviewed the session itself and made  appropriate changes Kelly Splinter MD [facility] pager [phone]   01/22/2019, 9:43 PM

## 2019-01-22 NOTE — ED Triage Notes (Signed)
Patient is a dialysis patient since 2005 does not produce urine however recently blood coming out of his penis and having worsening chronic neck pain radiating to lower back.

## 2019-01-22 NOTE — ED Notes (Signed)
Nephrology at bedside

## 2019-01-22 NOTE — ED Provider Notes (Signed)
Care assumed from [place], PA-C at shift change with Nephrology consult pending.   In brief, this patient is a 63 y.o. M with PMH/o ESRD (Dialysis T, Th, S), chronic neck and back pain who presents for evaluation of continued chronic neck and back pain and hematuria.  Patient reports that his last dialysis session was yesterday.  He states he is having continued pain of his neck and back.  He does report this pain is chronic.  Denies any fever, numbness/weakness of his arms or legs, new trauma, injury, fall.  Please see note from previous provider for full history/physical exam.  PLAN: No indication for imaging per previous provider. Patient with no new trauma, no neuro deficits. History/physical exam is not concerning for acute spinal cord injury. Patient follows with Lorin Mercy for chronic pain. He is currently only on NSAIDs. Patient given narcotics here in the ED. BUN/Cr are slightly above baseline and K is elevated. Previous provider discussed with Dr. Jonnie Finner (nephrology) who will come evaluate patient in the ED.   MDM:  Discussed patient with Dr. Jonnie Finner (nephrology). Patient will go to dialysis and will be discharged from there.   Discussed plan with patient and family at bedside. They are agreeable. Patient given short course of pain mediation per previous provider.  Outpatient urology referral given for follow-up regarding heematuria.   1. Hematuria, unspecified type   2. Chronic neck pain   3. Chronic back pain, unspecified back location, unspecified back pain laterality   4. Myoclonic jerking   5. Hyperkalemia        Desma Mcgregor 01/23/19 2044    Hayden Rasmussen, MD 01/24/19 1131

## 2019-01-22 NOTE — ED Notes (Signed)
ED Provider at bedside. 

## 2019-01-23 DIAGNOSIS — M4722 Other spondylosis with radiculopathy, cervical region: Secondary | ICD-10-CM | POA: Diagnosis not present

## 2019-01-24 DIAGNOSIS — E875 Hyperkalemia: Secondary | ICD-10-CM | POA: Diagnosis not present

## 2019-01-24 DIAGNOSIS — N186 End stage renal disease: Secondary | ICD-10-CM | POA: Diagnosis not present

## 2019-01-24 DIAGNOSIS — N2581 Secondary hyperparathyroidism of renal origin: Secondary | ICD-10-CM | POA: Diagnosis not present

## 2019-01-26 DIAGNOSIS — N2581 Secondary hyperparathyroidism of renal origin: Secondary | ICD-10-CM | POA: Diagnosis not present

## 2019-01-26 DIAGNOSIS — E875 Hyperkalemia: Secondary | ICD-10-CM | POA: Diagnosis not present

## 2019-01-26 DIAGNOSIS — N186 End stage renal disease: Secondary | ICD-10-CM | POA: Diagnosis not present

## 2019-01-28 DIAGNOSIS — N2581 Secondary hyperparathyroidism of renal origin: Secondary | ICD-10-CM | POA: Diagnosis not present

## 2019-01-28 DIAGNOSIS — E875 Hyperkalemia: Secondary | ICD-10-CM | POA: Diagnosis not present

## 2019-01-28 DIAGNOSIS — N186 End stage renal disease: Secondary | ICD-10-CM | POA: Diagnosis not present

## 2019-01-31 DIAGNOSIS — N2581 Secondary hyperparathyroidism of renal origin: Secondary | ICD-10-CM | POA: Diagnosis not present

## 2019-01-31 DIAGNOSIS — E875 Hyperkalemia: Secondary | ICD-10-CM | POA: Diagnosis not present

## 2019-01-31 DIAGNOSIS — N186 End stage renal disease: Secondary | ICD-10-CM | POA: Diagnosis not present

## 2019-02-01 ENCOUNTER — Ambulatory Visit (INDEPENDENT_AMBULATORY_CARE_PROVIDER_SITE_OTHER): Payer: Medicare Other | Admitting: Primary Care

## 2019-02-01 DIAGNOSIS — Z992 Dependence on renal dialysis: Secondary | ICD-10-CM | POA: Diagnosis not present

## 2019-02-01 DIAGNOSIS — N186 End stage renal disease: Secondary | ICD-10-CM | POA: Diagnosis not present

## 2019-02-01 DIAGNOSIS — I129 Hypertensive chronic kidney disease with stage 1 through stage 4 chronic kidney disease, or unspecified chronic kidney disease: Secondary | ICD-10-CM | POA: Diagnosis not present

## 2019-02-02 ENCOUNTER — Telehealth (INDEPENDENT_AMBULATORY_CARE_PROVIDER_SITE_OTHER): Payer: Self-pay | Admitting: Radiology

## 2019-02-02 DIAGNOSIS — N186 End stage renal disease: Secondary | ICD-10-CM | POA: Diagnosis not present

## 2019-02-02 DIAGNOSIS — D631 Anemia in chronic kidney disease: Secondary | ICD-10-CM | POA: Diagnosis not present

## 2019-02-02 DIAGNOSIS — D509 Iron deficiency anemia, unspecified: Secondary | ICD-10-CM | POA: Diagnosis not present

## 2019-02-02 DIAGNOSIS — E875 Hyperkalemia: Secondary | ICD-10-CM | POA: Diagnosis not present

## 2019-02-02 DIAGNOSIS — N2581 Secondary hyperparathyroidism of renal origin: Secondary | ICD-10-CM | POA: Diagnosis not present

## 2019-02-02 NOTE — Telephone Encounter (Signed)
I called patient to confirm appointment for 02/03/2019 at 1:45pm. Patient answered "No" to all COVID-19 screening questions.

## 2019-02-03 ENCOUNTER — Other Ambulatory Visit: Payer: Self-pay

## 2019-02-03 ENCOUNTER — Encounter (INDEPENDENT_AMBULATORY_CARE_PROVIDER_SITE_OTHER): Payer: Self-pay | Admitting: Orthopaedic Surgery

## 2019-02-03 ENCOUNTER — Other Ambulatory Visit: Payer: Self-pay | Admitting: *Deleted

## 2019-02-03 ENCOUNTER — Ambulatory Visit (INDEPENDENT_AMBULATORY_CARE_PROVIDER_SITE_OTHER): Payer: Medicare Other | Admitting: Orthopaedic Surgery

## 2019-02-03 VITALS — Ht 65.0 in | Wt 132.3 lb

## 2019-02-03 DIAGNOSIS — R200 Anesthesia of skin: Secondary | ICD-10-CM

## 2019-02-03 DIAGNOSIS — N250 Renal osteodystrophy: Secondary | ICD-10-CM | POA: Diagnosis not present

## 2019-02-03 DIAGNOSIS — M4722 Other spondylosis with radiculopathy, cervical region: Secondary | ICD-10-CM | POA: Diagnosis not present

## 2019-02-03 DIAGNOSIS — M4802 Spinal stenosis, cervical region: Secondary | ICD-10-CM

## 2019-02-03 MED ORDER — HYDROCODONE-ACETAMINOPHEN 5-325 MG PO TABS: 1.0000 | ORAL_TABLET | Freq: Two times a day (BID) | ORAL | 0 refills | Status: DC | PRN

## 2019-02-03 NOTE — Progress Notes (Signed)
Call to patient instructed to be at [facility] admitting at 8:45 am on 02/15/2019 for surgery. NPO past MN night prior and must have a driver and caregiver for discharge. Expect a call and follow the detailed surgery instructions received from the hospital pre-admission department. Reviewed visitor restrictions. Verbalized understanding of instructions.
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
## 2019-02-04 DIAGNOSIS — E875 Hyperkalemia: Secondary | ICD-10-CM | POA: Diagnosis not present

## 2019-02-04 DIAGNOSIS — N2581 Secondary hyperparathyroidism of renal origin: Secondary | ICD-10-CM | POA: Diagnosis not present

## 2019-02-04 DIAGNOSIS — D631 Anemia in chronic kidney disease: Secondary | ICD-10-CM | POA: Diagnosis not present

## 2019-02-04 DIAGNOSIS — D509 Iron deficiency anemia, unspecified: Secondary | ICD-10-CM | POA: Diagnosis not present

## 2019-02-04 DIAGNOSIS — N186 End stage renal disease: Secondary | ICD-10-CM | POA: Diagnosis not present

## 2019-02-07 DIAGNOSIS — N186 End stage renal disease: Secondary | ICD-10-CM | POA: Diagnosis not present

## 2019-02-07 DIAGNOSIS — D631 Anemia in chronic kidney disease: Secondary | ICD-10-CM | POA: Diagnosis not present

## 2019-02-07 DIAGNOSIS — N2581 Secondary hyperparathyroidism of renal origin: Secondary | ICD-10-CM | POA: Diagnosis not present

## 2019-02-07 DIAGNOSIS — E875 Hyperkalemia: Secondary | ICD-10-CM | POA: Diagnosis not present

## 2019-02-07 DIAGNOSIS — D509 Iron deficiency anemia, unspecified: Secondary | ICD-10-CM | POA: Diagnosis not present

## 2019-02-09 DIAGNOSIS — N2581 Secondary hyperparathyroidism of renal origin: Secondary | ICD-10-CM | POA: Diagnosis not present

## 2019-02-09 DIAGNOSIS — N186 End stage renal disease: Secondary | ICD-10-CM | POA: Diagnosis not present

## 2019-02-09 DIAGNOSIS — E875 Hyperkalemia: Secondary | ICD-10-CM | POA: Diagnosis not present

## 2019-02-09 DIAGNOSIS — D631 Anemia in chronic kidney disease: Secondary | ICD-10-CM | POA: Diagnosis not present

## 2019-02-09 DIAGNOSIS — D509 Iron deficiency anemia, unspecified: Secondary | ICD-10-CM | POA: Diagnosis not present

## 2019-02-11 DIAGNOSIS — N2581 Secondary hyperparathyroidism of renal origin: Secondary | ICD-10-CM | POA: Diagnosis not present

## 2019-02-11 DIAGNOSIS — D509 Iron deficiency anemia, unspecified: Secondary | ICD-10-CM | POA: Diagnosis not present

## 2019-02-11 DIAGNOSIS — D631 Anemia in chronic kidney disease: Secondary | ICD-10-CM | POA: Diagnosis not present

## 2019-02-11 DIAGNOSIS — E875 Hyperkalemia: Secondary | ICD-10-CM | POA: Diagnosis not present

## 2019-02-11 DIAGNOSIS — N186 End stage renal disease: Secondary | ICD-10-CM | POA: Diagnosis not present

## 2019-02-14 ENCOUNTER — Encounter (HOSPITAL_COMMUNITY): Payer: Self-pay | Admitting: *Deleted

## 2019-02-14 ENCOUNTER — Other Ambulatory Visit: Payer: Self-pay

## 2019-02-14 DIAGNOSIS — N186 End stage renal disease: Secondary | ICD-10-CM | POA: Diagnosis not present

## 2019-02-14 DIAGNOSIS — D631 Anemia in chronic kidney disease: Secondary | ICD-10-CM | POA: Diagnosis not present

## 2019-02-14 DIAGNOSIS — D509 Iron deficiency anemia, unspecified: Secondary | ICD-10-CM | POA: Diagnosis not present

## 2019-02-14 DIAGNOSIS — E875 Hyperkalemia: Secondary | ICD-10-CM | POA: Diagnosis not present

## 2019-02-14 DIAGNOSIS — N2581 Secondary hyperparathyroidism of renal origin: Secondary | ICD-10-CM | POA: Diagnosis not present

## 2019-02-14 NOTE — Progress Notes (Signed)
Mr Maurice Bell denies chest pain or shortness of breath. PCP is Dr Altamease Oiler with [facility]. Patient denies that he or his family or anyone he has been in connect with.has experienced any of the following: Cough Fever >100.4 Runny Nose Sore Throat Difficulty breathing/ shortness of breath Travel in past 14 days- none

## 2019-02-14 NOTE — Progress Notes (Signed)
   02/14/19 1039  OBSTRUCTIVE SLEEP APNEA  Have you ever been diagnosed with sleep apnea through a sleep study? No  Do you snore loudly (loud enough to be heard through closed doors)?  1  Do you often feel tired, fatigued, or sleepy during the daytime (such as falling asleep during driving or talking to someone)? 0  Has anyone observed you stop breathing during your sleep? 1  Do you have, or are you being treated for high blood pressure? 1  BMI more than 35 kg/m2? 0  Age > 50 (1-yes) 1  Neck circumference greater than:Male 16 inches or larger, Male 17inches or larger? 0  Male Gender (Yes=1) 1  Obstructive Sleep Apnea Score 5  Score 5 or greater  Results sent to PCP

## 2019-02-14 NOTE — Anesthesia Preprocedure Evaluation (Addendum)
Anesthesia Evaluation  Patient identified by MRN, date of birth, ID band Patient awake    Reviewed: Allergy & Precautions, NPO status , Patient's Chart, lab work & pertinent test results, reviewed documented beta blocker date and time   Airway Mallampati: II  TM Distance: >3 FB Neck ROM: Full    Dental  (+) Dental Advisory Given   Pulmonary shortness of breath, former smoker,    breath sounds clear to auscultation       Cardiovascular hypertension, Pt. on medications and Pt. on home beta blockers  Rhythm:Regular Rate:Normal     Neuro/Psych CVA    GI/Hepatic GERD  ,(+) Hepatitis -, C  Endo/Other  negative endocrine ROS  Renal/GU ESRF and DialysisRenal disease     Musculoskeletal  (+) Arthritis ,   Abdominal   Peds  Hematology  (+) anemia ,   Anesthesia Other Findings   Reproductive/Obstetrics                            Anesthesia Physical Anesthesia Plan  ASA: III  Anesthesia Plan: General   Post-op Pain Management:    Induction: Intravenous  PONV Risk Score and Plan: 1 and Ondansetron and Treatment may vary due to age or medical condition  Airway Management Planned: Oral ETT  Additional Equipment:   Intra-op Plan:   Post-operative Plan: Extubation in OR  Informed Consent: I have reviewed the patients History and Physical, chart, labs and discussed the procedure including the risks, benefits and alternatives for the proposed anesthesia with the patient or authorized representative who has indicated his/her understanding and acceptance.     Dental advisory given  Plan Discussed with: CRNA  Anesthesia Plan Comments:     Anesthesia Quick Evaluation

## 2019-02-15 ENCOUNTER — Other Ambulatory Visit: Payer: Self-pay

## 2019-02-15 ENCOUNTER — Ambulatory Visit (HOSPITAL_COMMUNITY)
Admission: RE | Admit: 2019-02-15 | Discharge: 2019-02-15 | Disposition: A | Discharge status: Home / Self Care | Payer: Medicare Other | Attending: Surgery | Admitting: Surgery

## 2019-02-15 ENCOUNTER — Ambulatory Visit (HOSPITAL_COMMUNITY): Payer: Medicare Other | Admitting: Physician Assistant

## 2019-02-15 ENCOUNTER — Encounter (HOSPITAL_COMMUNITY): Payer: Self-pay | Admitting: *Deleted

## 2019-02-15 ENCOUNTER — Encounter (HOSPITAL_COMMUNITY)
Admission: RE | Disposition: A | Discharge status: Home / Self Care | Payer: Self-pay | Source: Home / Self Care | Attending: Surgery

## 2019-02-15 DIAGNOSIS — Z8673 Personal history of transient ischemic attack (TIA), and cerebral infarction without residual deficits: Secondary | ICD-10-CM | POA: Diagnosis not present

## 2019-02-15 DIAGNOSIS — Z992 Dependence on renal dialysis: Secondary | ICD-10-CM | POA: Insufficient documentation

## 2019-02-15 DIAGNOSIS — B1920 Unspecified viral hepatitis C without hepatic coma: Secondary | ICD-10-CM | POA: Insufficient documentation

## 2019-02-15 DIAGNOSIS — F419 Anxiety disorder, unspecified: Secondary | ICD-10-CM | POA: Diagnosis not present

## 2019-02-15 DIAGNOSIS — Z79899 Other long term (current) drug therapy: Secondary | ICD-10-CM | POA: Insufficient documentation

## 2019-02-15 DIAGNOSIS — N185 Chronic kidney disease, stage 5: Secondary | ICD-10-CM | POA: Diagnosis not present

## 2019-02-15 DIAGNOSIS — D631 Anemia in chronic kidney disease: Secondary | ICD-10-CM | POA: Insufficient documentation

## 2019-02-15 DIAGNOSIS — Z87891 Personal history of nicotine dependence: Secondary | ICD-10-CM | POA: Diagnosis not present

## 2019-02-15 DIAGNOSIS — Z8249 Family history of ischemic heart disease and other diseases of the circulatory system: Secondary | ICD-10-CM | POA: Diagnosis not present

## 2019-02-15 DIAGNOSIS — N186 End stage renal disease: Secondary | ICD-10-CM | POA: Diagnosis not present

## 2019-02-15 DIAGNOSIS — M1990 Unspecified osteoarthritis, unspecified site: Secondary | ICD-10-CM | POA: Diagnosis not present

## 2019-02-15 DIAGNOSIS — F329 Major depressive disorder, single episode, unspecified: Secondary | ICD-10-CM | POA: Diagnosis not present

## 2019-02-15 DIAGNOSIS — I120 Hypertensive chronic kidney disease with stage 5 chronic kidney disease or end stage renal disease: Secondary | ICD-10-CM | POA: Diagnosis not present

## 2019-02-15 DIAGNOSIS — G253 Myoclonus: Secondary | ICD-10-CM | POA: Insufficient documentation

## 2019-02-15 DIAGNOSIS — X58XXXA Exposure to other specified factors, initial encounter: Secondary | ICD-10-CM | POA: Diagnosis not present

## 2019-02-15 DIAGNOSIS — T82898A Other specified complication of vascular prosthetic devices, implants and grafts, initial encounter: Secondary | ICD-10-CM | POA: Diagnosis not present

## 2019-02-15 HISTORY — DX: Pneumonia, unspecified organism: J18.9

## 2019-02-15 HISTORY — DX: Dyspnea, unspecified: R06.00

## 2019-02-15 HISTORY — PX: AV FISTULA PLACEMENT: SHX1204

## 2019-02-15 LAB — POCT I-STAT 4, (NA,K, GLUC, HGB,HCT)
Glucose, Bld: 89 mg/dL (ref 70–99)
HCT: 33 % — ABNORMAL LOW (ref 39.0–52.0)
Hemoglobin: 11.2 g/dL — ABNORMAL LOW (ref 13.0–17.0)
Potassium: 4.7 mmol/L (ref 3.5–5.1)
Sodium: 135 mmol/L (ref 135–145)

## 2019-02-15 SURGERY — INSERTION OF ARTERIOVENOUS (AV) GORE-TEX GRAFT ARM
Anesthesia: Monitor Anesthesia Care | Site: Arm Upper | Laterality: Right

## 2019-02-15 MED ORDER — SODIUM CHLORIDE 0.9 % IV SOLN
INTRAVENOUS | Status: DC
  Administered 2019-02-15: 10:00:00 via INTRAVENOUS

## 2019-02-15 MED ORDER — LIDOCAINE HCL (CARDIAC) PF 100 MG/5ML IV SOSY
PREFILLED_SYRINGE | INTRAVENOUS | Status: DC | PRN
  Administered 2019-02-15: 60 mg via INTRATRACHEAL

## 2019-02-15 MED ORDER — SUCCINYLCHOLINE CHLORIDE 200 MG/10ML IV SOSY
PREFILLED_SYRINGE | INTRAVENOUS | Status: AC
  Filled 2019-02-15: qty 10

## 2019-02-15 MED ORDER — SODIUM CHLORIDE 0.9 % IV SOLN: INTRAVENOUS | Status: DC

## 2019-02-15 MED ORDER — HYDROCODONE-ACETAMINOPHEN 5-325 MG PO TABS: 1.0000 | ORAL_TABLET | Freq: Four times a day (QID) | ORAL | 0 refills | Status: DC | PRN

## 2019-02-15 MED ORDER — SODIUM CHLORIDE 0.9 % IV SOLN
INTRAVENOUS | Status: DC | PRN
  Administered 2019-02-15 (×2): via INTRAVENOUS

## 2019-02-15 MED ORDER — SUCCINYLCHOLINE CHLORIDE 20 MG/ML IJ SOLN
INTRAMUSCULAR | Status: DC | PRN
  Administered 2019-02-15: 100 mg via INTRAVENOUS

## 2019-02-15 MED ORDER — PROTAMINE SULFATE 10 MG/ML IV SOLN
INTRAVENOUS | Status: DC | PRN
  Administered 2019-02-15: 40 mg via INTRAVENOUS

## 2019-02-15 MED ORDER — CEFAZOLIN SODIUM-DEXTROSE 2-4 GM/100ML-% IV SOLN
INTRAVENOUS | Status: AC
  Filled 2019-02-15: qty 100

## 2019-02-15 MED ORDER — 0.9 % SODIUM CHLORIDE (POUR BTL) OPTIME
TOPICAL | Status: DC | PRN
  Administered 2019-02-15: 1000 mL

## 2019-02-15 MED ORDER — CEFAZOLIN SODIUM 1 G IJ SOLR
INTRAMUSCULAR | Status: AC
  Filled 2019-02-15: qty 30

## 2019-02-15 MED ORDER — SODIUM CHLORIDE (PF) 0.9 % IJ SOLN
INTRAMUSCULAR | Status: AC
  Filled 2019-02-15: qty 20

## 2019-02-15 MED ORDER — CHLORHEXIDINE GLUCONATE 4 % EX LIQD: 60.0000 mL | Freq: Once | CUTANEOUS | Status: DC

## 2019-02-15 MED ORDER — SODIUM CHLORIDE 0.9 % IV SOLN
INTRAVENOUS | Status: DC | PRN
  Administered 2019-02-15: 12:00:00 50 ug/min via INTRAVENOUS

## 2019-02-15 MED ORDER — LIDOCAINE 2% (20 MG/ML) 5 ML SYRINGE
INTRAMUSCULAR | Status: AC
  Filled 2019-02-15: qty 10

## 2019-02-15 MED ORDER — HEMOSTATIC AGENTS (NO CHARGE) OPTIME
TOPICAL | Status: DC | PRN
  Administered 2019-02-15: 1 via TOPICAL

## 2019-02-15 MED ORDER — HEPARIN SODIUM (PORCINE) 1000 UNIT/ML IJ SOLN
INTRAMUSCULAR | Status: DC | PRN
  Administered 2019-02-15: 5000 [IU] via INTRAVENOUS

## 2019-02-15 MED ORDER — FENTANYL CITRATE (PF) 250 MCG/5ML IJ SOLN
INTRAMUSCULAR | Status: AC
  Filled 2019-02-15: qty 5

## 2019-02-15 MED ORDER — DEXAMETHASONE SODIUM PHOSPHATE 10 MG/ML IJ SOLN
INTRAMUSCULAR | Status: AC
  Filled 2019-02-15: qty 2

## 2019-02-15 MED ORDER — MIDAZOLAM HCL 2 MG/2ML IJ SOLN
INTRAMUSCULAR | Status: AC
  Filled 2019-02-15: qty 2

## 2019-02-15 MED ORDER — PHENYLEPHRINE 40 MCG/ML (10ML) SYRINGE FOR IV PUSH (FOR BLOOD PRESSURE SUPPORT)
PREFILLED_SYRINGE | INTRAVENOUS | Status: AC
  Filled 2019-02-15: qty 10

## 2019-02-15 MED ORDER — PROTAMINE SULFATE 10 MG/ML IV SOLN
INTRAVENOUS | Status: AC
  Filled 2019-02-15: qty 5

## 2019-02-15 MED ORDER — ETOMIDATE 2 MG/ML IV SOLN
INTRAVENOUS | Status: AC
  Filled 2019-02-15: qty 10

## 2019-02-15 MED ORDER — PROPOFOL 10 MG/ML IV BOLUS
INTRAVENOUS | Status: DC | PRN
  Administered 2019-02-15: 150 mg via INTRAVENOUS

## 2019-02-15 MED ORDER — PROPOFOL 10 MG/ML IV BOLUS
INTRAVENOUS | Status: AC
  Filled 2019-02-15: qty 20

## 2019-02-15 MED ORDER — ONDANSETRON HCL 4 MG/2ML IJ SOLN
INTRAMUSCULAR | Status: AC
  Filled 2019-02-15: qty 4

## 2019-02-15 MED ORDER — FENTANYL CITRATE (PF) 100 MCG/2ML IJ SOLN
INTRAMUSCULAR | Status: DC | PRN
  Administered 2019-02-15 (×2): 50 ug via INTRAVENOUS

## 2019-02-15 MED ORDER — CEFAZOLIN SODIUM-DEXTROSE 2-4 GM/100ML-% IV SOLN
2.0000 g | INTRAVENOUS | Status: AC
  Administered 2019-02-15: 2 g via INTRAVENOUS

## 2019-02-15 MED ORDER — SODIUM CHLORIDE 0.9 % IV SOLN
INTRAVENOUS | Status: DC | PRN
  Administered 2019-02-15: 500 mL

## 2019-02-15 MED ORDER — HEPARIN SODIUM (PORCINE) 1000 UNIT/ML IJ SOLN
INTRAMUSCULAR | Status: AC
  Filled 2019-02-15: qty 2

## 2019-02-15 MED ORDER — EPHEDRINE 5 MG/ML INJ
INTRAVENOUS | Status: AC
  Filled 2019-02-15: qty 10

## 2019-02-15 MED ORDER — GLYCOPYRROLATE PF 0.2 MG/ML IJ SOSY
PREFILLED_SYRINGE | INTRAMUSCULAR | Status: AC
  Filled 2019-02-15: qty 1

## 2019-02-15 MED ORDER — MIDAZOLAM HCL 5 MG/5ML IJ SOLN
INTRAMUSCULAR | Status: DC | PRN
  Administered 2019-02-15: 2 mg via INTRAVENOUS

## 2019-02-15 MED ORDER — ESMOLOL HCL 100 MG/10ML IV SOLN
INTRAVENOUS | Status: AC
  Filled 2019-02-15: qty 20

## 2019-02-15 MED ORDER — LIDOCAINE-EPINEPHRINE (PF) 1 %-1:200000 IJ SOLN
INTRAMUSCULAR | Status: AC
  Filled 2019-02-15: qty 30

## 2019-02-15 MED ORDER — SODIUM CHLORIDE 0.9 % IV SOLN
INTRAVENOUS | Status: AC
  Filled 2019-02-15: qty 1.2

## 2019-02-15 SURGICAL SUPPLY — 41 items
ADH SKN CLS APL DERMABOND .7 (GAUZE/BANDAGES/DRESSINGS) ×2
ADH SKN CLS LQ APL DERMABOND (GAUZE/BANDAGES/DRESSINGS) ×2
ARMBAND PINK RESTRICT EXTREMIT (MISCELLANEOUS) ×8 IMPLANT
CANISTER SUCT 3000ML PPV (MISCELLANEOUS) ×4 IMPLANT
CATH EMB 3FR 40CM (CATHETERS) ×3 IMPLANT
CLIP VESOCCLUDE MED 6/CT (CLIP) ×4 IMPLANT
CLIP VESOCCLUDE SM WIDE 6/CT (CLIP) ×4 IMPLANT
COVER PROBE W GEL 5X96 (DRAPES) ×4 IMPLANT
COVER SURGICAL LIGHT HANDLE (MISCELLANEOUS) ×3 IMPLANT
COVER WAND RF STERILE (DRAPES) ×4 IMPLANT
DERMABOND ADHESIVE PROPEN (GAUZE/BANDAGES/DRESSINGS) ×2
DERMABOND ADVANCED (GAUZE/BANDAGES/DRESSINGS) ×2
DERMABOND ADVANCED .7 DNX12 (GAUZE/BANDAGES/DRESSINGS) ×2 IMPLANT
DERMABOND ADVANCED .7 DNX6 (GAUZE/BANDAGES/DRESSINGS) ×1 IMPLANT
ELECT REM PT RETURN 9FT ADLT (ELECTROSURGICAL) ×4
ELECTRODE REM PT RTRN 9FT ADLT (ELECTROSURGICAL) ×2 IMPLANT
GLOVE BIOGEL PI IND STRL 7.0 (GLOVE) ×1 IMPLANT
GLOVE BIOGEL PI IND STRL 7.5 (GLOVE) ×2 IMPLANT
GLOVE BIOGEL PI INDICATOR 7.0 (GLOVE) ×2
GLOVE BIOGEL PI INDICATOR 7.5 (GLOVE) ×2
GLOVE SURG SS PI 6.5 STRL IVOR (GLOVE) ×3 IMPLANT
GLOVE SURG SS PI 7.5 STRL IVOR (GLOVE) ×4 IMPLANT
GOWN STRL REUS W/ TWL LRG LVL3 (GOWN DISPOSABLE) ×6 IMPLANT
GOWN STRL REUS W/ TWL XL LVL3 (GOWN DISPOSABLE) ×2 IMPLANT
GOWN STRL REUS W/TWL LRG LVL3 (GOWN DISPOSABLE) ×16
GOWN STRL REUS W/TWL XL LVL3 (GOWN DISPOSABLE) ×4
HEMOSTAT SNOW SURGICEL 2X4 (HEMOSTASIS) ×3 IMPLANT
KIT BASIN OR (CUSTOM PROCEDURE TRAY) ×4 IMPLANT
KIT TURNOVER KIT B (KITS) ×4 IMPLANT
NS IRRIG 1000ML POUR BTL (IV SOLUTION) ×4 IMPLANT
PACK CV ACCESS (CUSTOM PROCEDURE TRAY) ×4 IMPLANT
PAD ARMBOARD 7.5X6 YLW CONV (MISCELLANEOUS) ×8 IMPLANT
SUT PROLENE 6 0 BV (SUTURE) ×8 IMPLANT
SUT PROLENE 6 0 CC (SUTURE) ×1 IMPLANT
SUT VIC AB 3-0 SH 27 (SUTURE) ×8
SUT VIC AB 3-0 SH 27X BRD (SUTURE) ×4 IMPLANT
SUT VICRYL 4-0 PS2 18IN ABS (SUTURE) ×4 IMPLANT
SYR TOOMEY 50ML (SYRINGE) IMPLANT
TOWEL GREEN STERILE (TOWEL DISPOSABLE) ×4 IMPLANT
UNDERPAD 30X30 (UNDERPADS AND DIAPERS) ×4 IMPLANT
WATER STERILE IRR 1000ML POUR (IV SOLUTION) ×4 IMPLANT

## 2019-02-15 NOTE — Transfer of Care (Signed)
Immediate Anesthesia Transfer of Care Note  Patient: Maurice Bell  Procedure(s) Performed: Creation of arteriovenous fistula, right arm (Right Arm Upper)  Patient Location: PACU  Anesthesia Type:General  Level of Consciousness: awake, alert  and oriented  Airway & Oxygen Therapy: Patient Spontanous Breathing and Patient connected to face mask oxygen  Post-op Assessment: Report given to RN, Post -op Vital signs reviewed and stable and Patient moving all extremities X 4  Post vital signs: Reviewed and stable  Last Vitals:  Vitals Value Taken Time  BP 127/78 02/15/2019 12:48 PM  Temp    Pulse 69 02/15/2019 12:49 PM  Resp 24 02/15/2019 12:49 PM  SpO2 100 % 02/15/2019 12:49 PM  Vitals shown include unvalidated device data.  Last Pain:  Vitals:   02/15/19 0920  TempSrc:   PainSc: 3       Patients Stated Pain Goal: 4 (46/27/03 5009)  Complications: No apparent anesthesia complications

## 2019-02-15 NOTE — Interval H&P Note (Signed)
History and Physical Interval Note:  02/15/2019 10:23 AM  Maurice Bell  has presented today for surgery, with the diagnosis of END STAGE RENAL DISEASE FOR HEMODIALYSIS ACCESS.  The various methods of treatment have been discussed with the patient and family. After consideration of risks, benefits and other options for treatment, the patient has consented to  Procedure(s): ARTERIOVENOUS (AV) FISTULA CREATION RIGHT ARM (Right) INSERTION OF ARTERIOVENOUS (AV) GORE-TEX GRAFT RIGHT  ARM (Right) as a surgical intervention.  The patient's history has been reviewed, patient examined, no change in status, stable for surgery.  I have reviewed the patient's chart and labs.  Questions were answered to the patient's satisfaction.     Annamarie Major

## 2019-02-15 NOTE — Anesthesia Procedure Notes (Signed)
Procedure Name: Intubation Date/Time: 02/15/2019 11:04 AM Performed by: Neldon Newport, CRNA Pre-anesthesia Checklist: Timeout performed, Patient being monitored, Suction available, Emergency Drugs available and Patient identified Patient Re-evaluated:Patient Re-evaluated prior to induction Oxygen Delivery Method: Circle system utilized Preoxygenation: Pre-oxygenation with 100% oxygen Induction Type: IV induction and Rapid sequence Laryngoscope Size: Mac, Sabra Heck, 2, 3 and Glidescope Grade View: Grade III Tube type: Oral Number of attempts: 3 Placement Confirmation: positive ETCO2,  ETT inserted through vocal cords under direct vision and breath sounds checked- equal and bilateral Secured at: 23 cm Tube secured with: Tape Dental Injury: Teeth and Oropharynx as per pre-operative assessment  Difficulty Due To: Difficulty was unanticipated and Difficult Airway- due to reduced neck mobility

## 2019-02-15 NOTE — Discharge Instructions (Signed)
° °  [facility] ° °Discharge Instructions ° °AV Fistula or Graft Surgery for Dialysis Access ° °Please refer to the following instructions for your post-procedure care. Your surgeon or physician assistant will discuss any changes with you. ° °Activity ° °You may drive the day following your surgery, if you are comfortable and no longer taking prescription pain medication. Resume full activity as the soreness in your incision resolves. ° °Bathing/Showering ° °You may shower after you go home. Keep your incision dry for 48 hours. Do not soak in a bathtub, hot tub, or swim until the incision heals completely. You may not shower if you have a hemodialysis catheter. ° °Incision Care ° °Clean your incision with mild soap and water after 48 hours. Pat the area dry with a clean towel. You do not need a bandage unless otherwise instructed. Do not apply any ointments or creams to your incision. You may have skin glue on your incision. Do not peel it off. It will come off on its own in about one week. Your arm may swell a bit after surgery. To reduce swelling use pillows to elevate your arm so it is above your heart. Your doctor will tell you if you need to lightly wrap your arm with an ACE bandage. ° °Diet ° °Resume your normal diet. There are not special food restrictions following this procedure. In order to heal from your surgery, it is CRITICAL to get adequate nutrition. Your body requires vitamins, minerals, and protein. Vegetables are the best source of vitamins and minerals. Vegetables also provide the perfect balance of protein. Processed food has little nutritional value, so try to avoid this. ° °Medications ° °Resume taking all of your medications. If your incision is causing pain, you may take over-the counter pain relievers such as acetaminophen (Tylenol). If you were prescribed a stronger pain medication, please be aware these medications can cause nausea and constipation. Prevent  nausea by taking the medication with a snack or meal. Avoid constipation by drinking plenty of fluids and eating foods with high amount of fiber, such as fruits, vegetables, and grains. Do not take Tylenol if you are taking prescription pain medications. ° ° ° ° °Follow up °Your surgeon may want to see you in the office following your access surgery. If so, this will be arranged at the time of your surgery. ° °Please call us immediately for any of the following conditions: ° °Increased pain, redness, drainage (pus) from your incision site °Fever of 101 degrees or higher °Severe or worsening pain at your incision site °Hand pain or numbness. ° °Reduce your risk of vascular disease: ° °Stop smoking. If you would like help, call [facility] at 1-800-QUIT-NOW (1-800-784-8669) or [facility] at 336-586-4000 ° °Manage your cholesterol °Maintain a desired weight °Control your diabetes °Keep your blood pressure down ° °Dialysis ° °It will take several weeks to several months for your new dialysis access to be ready for use. Your surgeon will determine when it is OK to use it. Your nephrologist will continue to direct your dialysis. You can continue to use your Permcath until your new access is ready for use. ° °If you have any questions, please call the office at [phone]. ° °

## 2019-02-15 NOTE — Op Note (Signed)
° ° °  [name] MRN: [id number] DOB: [birth year] Sex: male  02/15/2019 Pre-operative Diagnosis: ESRD Post-operative diagnosis:  Same Surgeon:  Annamarie Major Assistants:  Arlee Muslim Procedure:   First stage right basilic vein fistula Anesthesia: General Blood Loss: Minimal Specimens: None  Findings: Excellent basilic vein, 5 mm.  The artery was 5 mm.  Indications: Patient has had multiple access procedures in the past.  He comes in today for right arm graft versus fistula.  Procedure:  The patient was identified in the holding area and taken to [place] [address]  The patient was then placed supine on the table. general anesthesia was administered.  The patient was prepped and draped in the usual sterile fashion.  A time out was called and antibiotics were administered.  Ultrasound was used to evaluate the basilic vein in the upper arm.  This appeared to be an excellent vein with no narrowing.  It measured 4-5 mm throughout.  Therefore I elected to proceed with a for staged basilic vein fistula as that is what we had discussed in the holding area.  A oblique incision was made at the antecubital crease.  I first dissected out the brachial artery.  This was a 5-6 mm artery.  It was mobilized proximally distally and encircled Vesseloops.  I then dissected out the basilic vein.  This was a very healthy appearing vein.  It was fully mobilized and marked for orientation.  It was then ligated distally.  The vein distended nicely with heparin saline.  There did appear to be a irregular valve leaflet which I resected.  The brachial artery was then occluded with vascular clamps.  #11 blade was used to make an arteriotomy was extended longitudinally Potts scissors.  Quickly, there was the appearance of some thrombus within the artery and so I administered 5000 units of heparin.  The artery was irrigated.  I then cut the vein to the appropriate length and performed a end-to-side anastomosis with a  running 6-0 Prolene.  There was some separation between the adventitia and the intima which I made sure was tacked appropriately with creation of the anastomosis.  Prior to completion the appropriate flushing maneuvers were performed and the anastomosis was completed.  The clamps were released.  There was an excellent thrill within the fistula and a triphasic radial artery Doppler signal.  Protamine was administered.  Hemostasis was achieved.  The incision was closed with 2 layers with 3-0 Vicryl followed by Dermabond.  There are no immediate complications.   Disposition: To PACU stable.   Theotis Burrow, M.D., [facility] [place] Office: [phone] Pager:  [phone]

## 2019-02-15 NOTE — Anesthesia Postprocedure Evaluation (Signed)
Anesthesia Post Note  Patient: Maurice Bell  Procedure(s) Performed: Creation of arteriovenous fistula, right arm (Right Arm Upper)     Patient location during evaluation: PACU Anesthesia Type: General Level of consciousness: awake and alert Pain management: pain level controlled Vital Signs Assessment: post-procedure vital signs reviewed and stable Respiratory status: spontaneous breathing, nonlabored ventilation, respiratory function stable and patient connected to nasal cannula oxygen Cardiovascular status: blood pressure returned to baseline and stable Postop Assessment: no apparent nausea or vomiting Anesthetic complications: no    Last Vitals:  Vitals:   02/15/19 0858 02/15/19 0920  BP:  (!) 168/96  Pulse: 76   Resp: 18   Temp: 36.9 C   SpO2: 99%     Last Pain:  Vitals:   02/15/19 1248  TempSrc:   PainSc: [name]

## 2019-02-16 ENCOUNTER — Encounter (HOSPITAL_COMMUNITY): Payer: Self-pay | Admitting: Surgery

## 2019-02-16 ENCOUNTER — Telehealth: Payer: Self-pay | Admitting: *Deleted

## 2019-02-16 DIAGNOSIS — D631 Anemia in chronic kidney disease: Secondary | ICD-10-CM | POA: Diagnosis not present

## 2019-02-16 DIAGNOSIS — D509 Iron deficiency anemia, unspecified: Secondary | ICD-10-CM | POA: Diagnosis not present

## 2019-02-16 DIAGNOSIS — N186 End stage renal disease: Secondary | ICD-10-CM | POA: Diagnosis not present

## 2019-02-16 DIAGNOSIS — N2581 Secondary hyperparathyroidism of renal origin: Secondary | ICD-10-CM | POA: Diagnosis not present

## 2019-02-16 DIAGNOSIS — E875 Hyperkalemia: Secondary | ICD-10-CM | POA: Diagnosis not present

## 2019-02-16 NOTE — Telephone Encounter (Signed)
Attempted to reach pt on only # available. No answer, voice MB not set up.

## 2019-02-17 ENCOUNTER — Telehealth: Payer: Self-pay | Admitting: Surgery

## 2019-02-17 NOTE — Telephone Encounter (Signed)
sch appt lvm on son phone mld ltr 03/20/2019 11am Dialysis Duplex 12pm p/o MD

## 2019-02-17 NOTE — Telephone Encounter (Signed)
-----   Message from Dagoberto Ligas, PA-C sent at 02/15/2019 12:56 PM EDT -----  Can you schedule an appt for this pt with a fistula duplex in 6 weeks to see Dr. Trula Slade.  PO 1st stage basilic fistula. Thanks, [facility]

## 2019-02-18 DIAGNOSIS — E875 Hyperkalemia: Secondary | ICD-10-CM | POA: Diagnosis not present

## 2019-02-18 DIAGNOSIS — D509 Iron deficiency anemia, unspecified: Secondary | ICD-10-CM | POA: Diagnosis not present

## 2019-02-18 DIAGNOSIS — D631 Anemia in chronic kidney disease: Secondary | ICD-10-CM | POA: Diagnosis not present

## 2019-02-18 DIAGNOSIS — N186 End stage renal disease: Secondary | ICD-10-CM | POA: Diagnosis not present

## 2019-02-18 DIAGNOSIS — N2581 Secondary hyperparathyroidism of renal origin: Secondary | ICD-10-CM | POA: Diagnosis not present

## 2019-02-20 NOTE — Telephone Encounter (Signed)
Called son, Maurice Bell listed in demographics and LVM requesting  he call back to discuss converting patient's appointment to video. Advised if unable to do video appt, we need to reschedule for soonest available.

## 2019-02-21 DIAGNOSIS — E875 Hyperkalemia: Secondary | ICD-10-CM | POA: Diagnosis not present

## 2019-02-21 DIAGNOSIS — N186 End stage renal disease: Secondary | ICD-10-CM | POA: Diagnosis not present

## 2019-02-21 DIAGNOSIS — D509 Iron deficiency anemia, unspecified: Secondary | ICD-10-CM | POA: Diagnosis not present

## 2019-02-21 DIAGNOSIS — N2581 Secondary hyperparathyroidism of renal origin: Secondary | ICD-10-CM | POA: Diagnosis not present

## 2019-02-21 DIAGNOSIS — D631 Anemia in chronic kidney disease: Secondary | ICD-10-CM | POA: Diagnosis not present

## 2019-02-21 NOTE — Telephone Encounter (Signed)
LVM #2 for son advising him that I am cancelling patient's appt tomorrow. Advised he please call back to reschedule a video visit - due to Covid 19, we're not seeing patient in the office. I advised him there is availability Thurs and next week. Left office numbler.

## 2019-02-22 ENCOUNTER — Ambulatory Visit: Payer: Medicare Other | Admitting: Diagnostic Neuroimaging

## 2019-02-23 DIAGNOSIS — N2581 Secondary hyperparathyroidism of renal origin: Secondary | ICD-10-CM | POA: Diagnosis not present

## 2019-02-23 DIAGNOSIS — E875 Hyperkalemia: Secondary | ICD-10-CM | POA: Diagnosis not present

## 2019-02-23 DIAGNOSIS — D631 Anemia in chronic kidney disease: Secondary | ICD-10-CM | POA: Diagnosis not present

## 2019-02-23 DIAGNOSIS — N186 End stage renal disease: Secondary | ICD-10-CM | POA: Diagnosis not present

## 2019-02-23 DIAGNOSIS — D509 Iron deficiency anemia, unspecified: Secondary | ICD-10-CM | POA: Diagnosis not present

## 2019-02-25 DIAGNOSIS — D631 Anemia in chronic kidney disease: Secondary | ICD-10-CM | POA: Diagnosis not present

## 2019-02-25 DIAGNOSIS — E875 Hyperkalemia: Secondary | ICD-10-CM | POA: Diagnosis not present

## 2019-02-25 DIAGNOSIS — D509 Iron deficiency anemia, unspecified: Secondary | ICD-10-CM | POA: Diagnosis not present

## 2019-02-25 DIAGNOSIS — N2581 Secondary hyperparathyroidism of renal origin: Secondary | ICD-10-CM | POA: Diagnosis not present

## 2019-02-25 DIAGNOSIS — N186 End stage renal disease: Secondary | ICD-10-CM | POA: Diagnosis not present

## 2019-02-27 ENCOUNTER — Telehealth: Payer: Self-pay | Admitting: Diagnostic Neuroimaging

## 2019-02-27 NOTE — Telephone Encounter (Signed)
Noted, a VM x 2 had been left for patient's son last week.

## 2019-02-27 NOTE — Telephone Encounter (Signed)
Tried to call patient to r/s apt no answer and voice mail is not set up, I was not able to lm for pt

## 2019-02-28 ENCOUNTER — Other Ambulatory Visit: Payer: Self-pay

## 2019-02-28 DIAGNOSIS — N186 End stage renal disease: Secondary | ICD-10-CM | POA: Diagnosis not present

## 2019-02-28 DIAGNOSIS — D631 Anemia in chronic kidney disease: Secondary | ICD-10-CM | POA: Diagnosis not present

## 2019-02-28 DIAGNOSIS — N2581 Secondary hyperparathyroidism of renal origin: Secondary | ICD-10-CM | POA: Diagnosis not present

## 2019-02-28 DIAGNOSIS — D509 Iron deficiency anemia, unspecified: Secondary | ICD-10-CM | POA: Diagnosis not present

## 2019-02-28 DIAGNOSIS — Z992 Dependence on renal dialysis: Secondary | ICD-10-CM

## 2019-02-28 DIAGNOSIS — E875 Hyperkalemia: Secondary | ICD-10-CM | POA: Diagnosis not present

## 2019-03-02 DIAGNOSIS — D631 Anemia in chronic kidney disease: Secondary | ICD-10-CM | POA: Diagnosis not present

## 2019-03-02 DIAGNOSIS — E875 Hyperkalemia: Secondary | ICD-10-CM | POA: Diagnosis not present

## 2019-03-02 DIAGNOSIS — N2581 Secondary hyperparathyroidism of renal origin: Secondary | ICD-10-CM | POA: Diagnosis not present

## 2019-03-02 DIAGNOSIS — N186 End stage renal disease: Secondary | ICD-10-CM | POA: Diagnosis not present

## 2019-03-02 DIAGNOSIS — D509 Iron deficiency anemia, unspecified: Secondary | ICD-10-CM | POA: Diagnosis not present

## 2019-03-03 ENCOUNTER — Other Ambulatory Visit: Payer: Self-pay

## 2019-03-03 ENCOUNTER — Encounter (INDEPENDENT_AMBULATORY_CARE_PROVIDER_SITE_OTHER): Payer: Self-pay | Admitting: Orthopaedic Surgery

## 2019-03-03 ENCOUNTER — Ambulatory Visit (INDEPENDENT_AMBULATORY_CARE_PROVIDER_SITE_OTHER): Payer: Medicare Other | Admitting: Orthopaedic Surgery

## 2019-03-03 VITALS — Ht 65.0 in | Wt 140.0 lb

## 2019-03-03 DIAGNOSIS — Z992 Dependence on renal dialysis: Secondary | ICD-10-CM | POA: Diagnosis not present

## 2019-03-03 DIAGNOSIS — N250 Renal osteodystrophy: Secondary | ICD-10-CM

## 2019-03-03 DIAGNOSIS — M4722 Other spondylosis with radiculopathy, cervical region: Secondary | ICD-10-CM

## 2019-03-03 DIAGNOSIS — M9981 Other biomechanical lesions of cervical region: Secondary | ICD-10-CM

## 2019-03-03 DIAGNOSIS — R200 Anesthesia of skin: Secondary | ICD-10-CM

## 2019-03-03 DIAGNOSIS — S134XXS Sprain of ligaments of cervical spine, sequela: Secondary | ICD-10-CM | POA: Diagnosis not present

## 2019-03-03 DIAGNOSIS — I129 Hypertensive chronic kidney disease with stage 1 through stage 4 chronic kidney disease, or unspecified chronic kidney disease: Secondary | ICD-10-CM | POA: Diagnosis not present

## 2019-03-03 DIAGNOSIS — M4012 Other secondary kyphosis, cervical region: Secondary | ICD-10-CM | POA: Diagnosis not present

## 2019-03-03 DIAGNOSIS — N186 End stage renal disease: Secondary | ICD-10-CM | POA: Diagnosis not present

## 2019-03-03 DIAGNOSIS — M4802 Spinal stenosis, cervical region: Secondary | ICD-10-CM

## 2019-03-04 DIAGNOSIS — D631 Anemia in chronic kidney disease: Secondary | ICD-10-CM | POA: Diagnosis not present

## 2019-03-04 DIAGNOSIS — N186 End stage renal disease: Secondary | ICD-10-CM | POA: Diagnosis not present

## 2019-03-04 DIAGNOSIS — N2581 Secondary hyperparathyroidism of renal origin: Secondary | ICD-10-CM | POA: Diagnosis not present

## 2019-03-04 DIAGNOSIS — E875 Hyperkalemia: Secondary | ICD-10-CM | POA: Diagnosis not present

## 2019-03-04 DIAGNOSIS — D509 Iron deficiency anemia, unspecified: Secondary | ICD-10-CM | POA: Diagnosis not present

## 2019-03-06 ENCOUNTER — Encounter (INDEPENDENT_AMBULATORY_CARE_PROVIDER_SITE_OTHER): Payer: Self-pay | Admitting: Orthopaedic Surgery

## 2019-03-06 DIAGNOSIS — M4012 Other secondary kyphosis, cervical region: Secondary | ICD-10-CM | POA: Insufficient documentation

## 2019-03-06 DIAGNOSIS — S134XXA Sprain of ligaments of cervical spine, initial encounter: Secondary | ICD-10-CM | POA: Insufficient documentation

## 2019-03-06 DIAGNOSIS — M9981 Other biomechanical lesions of cervical region: Secondary | ICD-10-CM

## 2019-03-06 DIAGNOSIS — M4802 Spinal stenosis, cervical region: Secondary | ICD-10-CM | POA: Insufficient documentation

## 2019-03-06 NOTE — Progress Notes (Addendum)
Office Visit Note   Patient: Maurice Bell           Date of Birth: [birth year]           MRN: [id number] Visit Date: 03/03/2019              Requested by: [name], PA-C No address on file PCP: [name], PA-C   Assessment & Plan: Visit Diagnoses:  1. Other spondylosis with radiculopathy, cervical region   2. Numbness of left hand   3. Neural foraminal stenosis of cervical spine   4. Other secondary kyphosis, cervical region   5. Renal osteodystrophy   6. 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    Plan: Patient has multilevel spondylosis with stenosis from renal osteodystrophy from C3-C7 with mild central stenosis several levels and severe foraminal stenosis bilaterally at C4 and on the left at C5.  Moderate foraminal stenosis at other levels between C3 and C7.  He has listhesis and changes consistent with renal osteodystrophy.  Lab tests been negative for any evidence of infection.  Patient posterior interspinous ligament disruption and a C5 spinous process nonunion so he is unstable posteriorly as well.  Patient will require two-stage procedure.  On flexion-extension x-rays he moves at all levels and plan would be anterior cervical discectomy and fusion at C3-4, C4-5, C5-6 and C6-7.  I discussed with the patient that generally maximum number of levels done anteriorly is usually 3 however due to his posterior instability and with the motion he has we should be able to correct his kyphosis with discectomies at 4 levels and then place him in a Aspen collar postoperatively.  He understands a second procedure would be needed several weeks later once his anterior swelling is down and he would require lateral mass screw fixation and posterior fusion  from C3-C7.  This should maintain his stability and take care of his posterior ligamentous instability as well as the spinous process  nonunion at C5.  Posterior fusion would be done at the time of lateral mass plating as well which should given good stability with fusion both anteriorly and posteriorly.  We discussed problems with dysphasia and dysphonia with anterior fusion.  Questions were elicited and answered.  Risks of infection was discussed.  Patient understands and requests we proceed.  Images, plain radiographs, MRI scans were reviewed with him in detail.  Due to the complex nature of patient's case images and outlined surgical plan was reviewed with my partner Dr. Louanne Skye  Who also does spine surgery and he agrees with the outlined surgical plan and will assist me with the posterior second stage lateral mass plating procedure several weeks after the initial anterior procedure.    Orders:  No orders of the defined types were placed in this encounter.  No orders of the defined types were placed in this encounter.     Procedures: No procedures performed   Clinical Data: No additional findings.   Subjective: Chief Complaint  Patient presents with   Neck - Follow-up, Pain    HPI 63 year old male returns with ongoing problems with severe cervical spondylosis multilevel with associated multilevel spinal stenosis.  He has osteodystrophy associated with long-term renal dialysis.  He has old spinous process fracture at C5 posteriorly with hypermobility and multilevel listhesis from C3-C7.  He continues to have severe pain with neck flexion  extension rotation and numbness that radiates from his neck into his arms and hands.  MRI scan shows mass-effect on the cord but fortunately no abnormal cord signal has developed.  He has significant kyphosis in the cervical spine.  He has had shunt revision for dialysis done 02/15/2019 and now states he is ready to proceed with cervical anterior fusion followed several weeks later by posterior instrumented fusion.  He is not had any falling episodes.  Review of Systems view of system  positive for end-stage renal dialysis.  History of hepatitis.  Movement disorder with past cerebellar infarct.  History of seizures.  Positive for previous CVA, patent ductus, hypertension, chronic neck pain, radial 3 finger numbness.   Objective: Vital Signs: Ht 5\' 5"  (1.651 m)    Wt 140 lb (63.5 kg)    BMI 23.30 kg/m   Physical Exam Constitutional:      Appearance: He is well-developed.  HENT:     Head: Normocephalic and atraumatic.  Eyes:     Pupils: Pupils are equal, round, and reactive to light.  Neck:     Thyroid: No thyromegaly.     Trachea: No tracheal deviation.  Cardiovascular:     Rate and Rhythm: Normal rate.  Pulmonary:     Effort: Pulmonary effort is normal.     Breath sounds: No wheezing.  Abdominal:     General: Bowel sounds are normal.     Palpations: Abdomen is soft.  Skin:    General: Skin is warm and dry.     Capillary Refill: Capillary refill takes less than 2 seconds.  Neurological:     Mental Status: He is alert and oriented to person, place, and time.  Psychiatric:        Behavior: Behavior normal.        Thought Content: Thought content normal.        Judgment: Judgment normal.     Ortho Exam multiple dialysis shunt scars.  Current is right upper arm.  Patient has subclavian catheter for dialysis currently pending maturation of his shunt.  Patient is amatory without myelopathic type gait.  Normal with between strides.  No lower extremity hyperreflexia no clonus.  3+ reflexes biceps triceps brachioradialis symmetrical.  Left neck catheter.  He has 20% rotation cervical spine right and left with pain increased pain with tilting.  Severe brachial plexus tenderness both right and left increased pain with cervical compression no improvement with distraction.  Symmetrical decreased biceps triceps strength mild weakness of left wrist flexion.  Interossei are strong.  Specialty Comments:  No specialty comments available.  Imaging:  Result Narrative    Flexion-extension C-spine x-rays are obtained and reviewed. This shows  the significant multilevel spondylosis C3-C7. Nonunion spinous process  fracture noted. No abnormal listhesis is noted with flexion extension.  Impression: Multilevel spondylosis C3-C7. Renal osteodystrophy with  spondyloarthropathy noted.   ADDENDUM REPORT: 09/29/2018 08:42  ADDENDUM: Study discussed by telephone with Dr. Dolly Rias in the ED on 09/29/2018 at 08:42 .   Electronically Signed   By: Genevie Ann M.D.   On: 09/29/2018 08:42   Addended by Gaspar Cola, MD on 09/29/2018 8:44 AM    Study Result   CLINICAL DATA:  63 year old male, end-stage renal disease on dialysis, right upper extremity pain numbness and tingling.  EXAM: MRI CERVICAL SPINE WITHOUT CONTRAST  TECHNIQUE: Multiplanar, multisequence MR imaging of the cervical spine was performed. No intravenous contrast was administered.  COMPARISON:  Cervical spine CT earlier today, CTA neck 09/06/2018  FINDINGS: Alignment: Reversal of cervical lordosis mildly increased since 09/06/2018.  Vertebrae: Diffusely abnormal background bone marrow signal, decreased on T1, T2, and STIR. Superimposed heterogeneous increased T1 and T2 signal in the cervical vertebrae C4 through C7. Superimposed intermittent patchy, mild increased STIR signal at those levels. And abnormal STIR signal most notably at C3 where endplate bone resorption has been present since 09/06/2018. However, the intervening disc space signal at these levels remains fairly normal. Superimposed unhealed C5 spinous process fracture with marrow edema and regional soft tissue abnormality (see below).  Cord: Spinal cord signal is within normal limits at all visualized levels.  Posterior Fossa, vertebral arteries, paraspinal tissues: Cervicomedullary junction is within normal limits. Preserved major vascular flow voids in the neck.  Abnormal interspinous ligament signal  at C5-C6 and to a lesser extent the adjacent levels with superimposed un healed appearing C5 spinous process fracture with focal fluid or edema within the fracture cleft. Patchy medial erector spinae muscle edema more so on the right.  Anteriorly there is bilateral longus coli muscle edema at the C3 and C4 levels plus trace prevertebral fluid (series 9, image 16).  Disc levels:  Disc degeneration except at C2-C3.  Degenerative disc osteophyte complex at all other levels, which results in spinal stenosis C3-C4 through C6-C7 with up to mild spinal cord mass effect (maximal at C5-C6 on series 9, image 27).  Associated widespread moderate and occasionally severe bilateral cervical neural foraminal stenosis.  Severe foraminal stenosis at the right greater than left C4, left C5 nerve levels.  IMPRESSION: 1. 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 Query clinical or laboratory findings suggestive of infection. 2. Unhealed C5 spinous process fracture with associated posterior ligamentous injury (interspinous ligament). 3. Anteriorly there is nonspecific longus coli muscle edema and trace prevertebral fluid, maximal at C3 and likely reactive due to #1. 4. 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 5. Severe degenerative neural foraminal stenosis at the bilateral C4 and left C5 nerve levels, with widespread moderate bilateral foraminal stenosis elsewhere.  Electronically Signed: By: Genevie Ann M.D. On: 09/29/2018 08:25       PMFS History: Patient Active Problem List   Diagnosis Date Noted   Neural foraminal stenosis of cervical spine  03/06/2019   Other secondary kyphosis, 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   Injury to ligament of cervical spine 03/06/2019   Spinal stenosis of cervical region 02/03/2019   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   Numbness of left hand 02/03/2019   Pancytopenia ([place]) 11/27/2018   GERD (gastroesophageal reflux disease) 11/27/2018   Depression 11/27/2018   Abnormal EKG 11/27/2018   Other spondylosis with radiculopathy, cervical region 11/17/2018   Malnutrition of moderate degree 11/04/2018   Endotracheal tube present    Acute metabolic encephalopathy 02/58/5277   Hypertensive urgency 11/03/2018   Hypoglycemia without diagnosis of diabetes mellitus 11/03/2018   Anemia of chronic disease 11/03/2018   Uremia 10/06/2018   Hypertension    Myoclonic jerking    Hepatitis    History of anemia due to CKD    Movement disorder 09/07/2018   Acute encephalopathy 09/07/2018   Right corneal abrasion    ESRD (end stage renal disease) on dialysis [facility])    Hyperkalemia 07/08/2018   Need for acute hemodialysis ([place])  03/31/2018   ESRD (end stage renal disease) ([place]) 03/31/2018   HTN (hypertension) 03/31/2018   Past Medical History:  Diagnosis Date   Anemia    Anxiety    Arthritis    Depression    Dyspnea    "only when I have too much fluid"   ESRD (end stage renal disease) on dialysis ([place])    "[place]. [place]; TTS" (10/06/2018)   GERD (gastroesophageal reflux disease)    Hepatitis C    "never treated"    History of anemia due to CKD    Hypertension    Myoclonic jerking    Pneumonia    Stroke ([facility])    bilat leg weakness residual- "years ago" - has weakness at times   Wears glasses    Wears partial dentures     Family History  Problem Relation Age of Onset   High blood pressure Other     Past Surgical History:  Procedure Laterality Date   AV FISTULA PLACEMENT Bilateral    "right side not working anymore" (10/06/2018)   AV FISTULA PLACEMENT Right 02/15/2019    Procedure: Creation of arteriovenous fistula, right arm;  Surgeon: Serafina Mitchell, MD;  Location: [facility];  Service: Vascular;  Laterality: Right;   COLON RESECTION     from [place] Left 11/07/2018   Procedure: INSERTION OF TUNNELED [place] - LEFT INTERNAL [place];  Surgeon: Angelia Mould, MD;  [facility];  Service: Vascular;  Laterality: Left;   UMBILICAL HERNIA REPAIR     Social History   Occupational History   Not on file  Tobacco Use   Smoking status: Former Smoker    Types: Cigarettes   Smokeless tobacco: Never Used   Tobacco comment: "smoked as a kid"  Substance and Sexual Activity   Alcohol use: Not Currently   Drug use: Yes    Types: Marijuana    Comment:  "none since 1992"   Sexual activity: Yes

## 2019-03-07 DIAGNOSIS — D509 Iron deficiency anemia, unspecified: Secondary | ICD-10-CM | POA: Diagnosis not present

## 2019-03-07 DIAGNOSIS — N186 End stage renal disease: Secondary | ICD-10-CM | POA: Diagnosis not present

## 2019-03-07 DIAGNOSIS — D631 Anemia in chronic kidney disease: Secondary | ICD-10-CM | POA: Diagnosis not present

## 2019-03-07 DIAGNOSIS — E875 Hyperkalemia: Secondary | ICD-10-CM | POA: Diagnosis not present

## 2019-03-07 DIAGNOSIS — N2581 Secondary hyperparathyroidism of renal origin: Secondary | ICD-10-CM | POA: Diagnosis not present

## 2019-03-09 DIAGNOSIS — E875 Hyperkalemia: Secondary | ICD-10-CM | POA: Diagnosis not present

## 2019-03-09 DIAGNOSIS — N186 End stage renal disease: Secondary | ICD-10-CM | POA: Diagnosis not present

## 2019-03-09 DIAGNOSIS — D631 Anemia in chronic kidney disease: Secondary | ICD-10-CM | POA: Diagnosis not present

## 2019-03-09 DIAGNOSIS — N2581 Secondary hyperparathyroidism of renal origin: Secondary | ICD-10-CM | POA: Diagnosis not present

## 2019-03-09 DIAGNOSIS — D509 Iron deficiency anemia, unspecified: Secondary | ICD-10-CM | POA: Diagnosis not present

## 2019-03-11 DIAGNOSIS — N186 End stage renal disease: Secondary | ICD-10-CM | POA: Diagnosis not present

## 2019-03-11 DIAGNOSIS — D509 Iron deficiency anemia, unspecified: Secondary | ICD-10-CM | POA: Diagnosis not present

## 2019-03-11 DIAGNOSIS — N2581 Secondary hyperparathyroidism of renal origin: Secondary | ICD-10-CM | POA: Diagnosis not present

## 2019-03-11 DIAGNOSIS — E875 Hyperkalemia: Secondary | ICD-10-CM | POA: Diagnosis not present

## 2019-03-11 DIAGNOSIS — D631 Anemia in chronic kidney disease: Secondary | ICD-10-CM | POA: Diagnosis not present

## 2019-03-12 ENCOUNTER — Encounter (HOSPITAL_COMMUNITY): Payer: Self-pay | Admitting: Emergency Medicine

## 2019-03-12 ENCOUNTER — Emergency Department (HOSPITAL_COMMUNITY): Payer: Medicare Other

## 2019-03-12 ENCOUNTER — Emergency Department (HOSPITAL_COMMUNITY)
Admission: EM | Admit: 2019-03-12 | Discharge: 2019-03-12 | Disposition: A | Discharge status: Home / Self Care | Payer: Medicare Other | Attending: Emergency Medicine | Admitting: Emergency Medicine

## 2019-03-12 DIAGNOSIS — S3993XA Unspecified injury of pelvis, initial encounter: Secondary | ICD-10-CM | POA: Diagnosis not present

## 2019-03-12 DIAGNOSIS — N186 End stage renal disease: Secondary | ICD-10-CM | POA: Insufficient documentation

## 2019-03-12 DIAGNOSIS — T07XXXA Unspecified multiple injuries, initial encounter: Secondary | ICD-10-CM | POA: Diagnosis present

## 2019-03-12 DIAGNOSIS — Y999 Unspecified external cause status: Secondary | ICD-10-CM | POA: Diagnosis not present

## 2019-03-12 DIAGNOSIS — S299XXA Unspecified injury of thorax, initial encounter: Secondary | ICD-10-CM | POA: Diagnosis not present

## 2019-03-12 DIAGNOSIS — Z992 Dependence on renal dialysis: Secondary | ICD-10-CM | POA: Diagnosis not present

## 2019-03-12 DIAGNOSIS — Z452 Encounter for adjustment and management of vascular access device: Secondary | ICD-10-CM | POA: Diagnosis not present

## 2019-03-12 DIAGNOSIS — J9811 Atelectasis: Secondary | ICD-10-CM | POA: Diagnosis not present

## 2019-03-12 DIAGNOSIS — Y929 Unspecified place or not applicable: Secondary | ICD-10-CM | POA: Insufficient documentation

## 2019-03-12 DIAGNOSIS — Y939 Activity, unspecified: Secondary | ICD-10-CM | POA: Diagnosis not present

## 2019-03-12 DIAGNOSIS — M542 Cervicalgia: Secondary | ICD-10-CM | POA: Diagnosis not present

## 2019-03-12 DIAGNOSIS — I619 Nontraumatic intracerebral hemorrhage, unspecified: Secondary | ICD-10-CM | POA: Diagnosis not present

## 2019-03-12 DIAGNOSIS — N281 Cyst of kidney, acquired: Secondary | ICD-10-CM | POA: Diagnosis not present

## 2019-03-12 LAB — SAMPLE TO BLOOD BANK

## 2019-03-12 LAB — CBC
HCT: 34.5 % — ABNORMAL LOW (ref 39.0–52.0)
Hemoglobin: 11.1 g/dL — ABNORMAL LOW (ref 13.0–17.0)
MCH: 34.4 pg — ABNORMAL HIGH (ref 26.0–34.0)
MCHC: 32.2 g/dL (ref 30.0–36.0)
MCV: 106.8 fL — ABNORMAL HIGH (ref 80.0–100.0)
Platelets: 158 10*3/uL (ref 150–400)
RBC: 3.23 MIL/uL — ABNORMAL LOW (ref 4.22–5.81)
RDW: 17.8 % — ABNORMAL HIGH (ref 11.5–15.5)
WBC: 4.1 10*3/uL (ref 4.0–10.5)
nRBC: 0 % (ref 0.0–0.2)

## 2019-03-12 LAB — COMPREHENSIVE METABOLIC PANEL
ALT: 22 U/L (ref 0–44)
AST: 36 U/L (ref 15–41)
Albumin: 3.7 g/dL (ref 3.5–5.0)
Alkaline Phosphatase: 49 U/L (ref 38–126)
Anion gap: 16 — ABNORMAL HIGH (ref 5–15)
BUN: 32 mg/dL — ABNORMAL HIGH (ref 6–20)
CO2: 24 mmol/L (ref 22–32)
Calcium: 9.3 mg/dL (ref 8.9–10.3)
Chloride: 96 mmol/L — ABNORMAL LOW (ref 98–111)
Creatinine, Ser: 9.53 mg/dL — ABNORMAL HIGH (ref 0.61–1.24)
GFR calc Af Amer: 6 mL/min — ABNORMAL LOW (ref >60)
GFR calc non Af Amer: 5 mL/min — ABNORMAL LOW (ref >60)
Glucose, Bld: 111 mg/dL — ABNORMAL HIGH (ref 70–99)
Potassium: 3.9 mmol/L (ref 3.5–5.1)
Sodium: 136 mmol/L (ref 135–145)
Total Bilirubin: 0.5 mg/dL (ref 0.3–1.2)
Total Protein: 7.4 g/dL (ref 6.5–8.1)

## 2019-03-12 LAB — PROTIME-INR
INR: 1.1 (ref 0.8–1.2)
Prothrombin Time: 14.4 seconds (ref 11.4–15.2)

## 2019-03-12 LAB — LACTIC ACID, PLASMA: Lactic Acid, Venous: 2.4 mmol/L (ref 0.5–1.9)

## 2019-03-12 LAB — ETHANOL: Alcohol, Ethyl (B): <10 mg/dL (ref <10)

## 2019-03-12 MED ORDER — IOHEXOL 300 MG/ML  SOLN
100.0000 mL | Freq: Once | INTRAMUSCULAR | Status: AC | PRN
  Administered 2019-03-12: 21:00:00 100 mL via INTRAVENOUS

## 2019-03-12 NOTE — ED Notes (Signed)
Patient transported to CT scan . 

## 2019-03-12 NOTE — ED Notes (Signed)
CT scan notified that pt. is ready for CT scan .
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
## 2019-03-12 NOTE — ED Notes (Signed)
20:40 Patient's BMP has not resulted, which is needed for contrast administration since patient is LEVEL II. ED nurse informed.

## 2019-03-13 ENCOUNTER — Encounter (INDEPENDENT_AMBULATORY_CARE_PROVIDER_SITE_OTHER): Payer: Self-pay | Admitting: Orthopaedic Surgery

## 2019-03-14 DIAGNOSIS — E875 Hyperkalemia: Secondary | ICD-10-CM | POA: Diagnosis not present

## 2019-03-14 DIAGNOSIS — N2581 Secondary hyperparathyroidism of renal origin: Secondary | ICD-10-CM | POA: Diagnosis not present

## 2019-03-14 DIAGNOSIS — D509 Iron deficiency anemia, unspecified: Secondary | ICD-10-CM | POA: Diagnosis not present

## 2019-03-14 DIAGNOSIS — D631 Anemia in chronic kidney disease: Secondary | ICD-10-CM | POA: Diagnosis not present

## 2019-03-14 DIAGNOSIS — N186 End stage renal disease: Secondary | ICD-10-CM | POA: Diagnosis not present

## 2019-03-16 DIAGNOSIS — D509 Iron deficiency anemia, unspecified: Secondary | ICD-10-CM | POA: Diagnosis not present

## 2019-03-16 DIAGNOSIS — N186 End stage renal disease: Secondary | ICD-10-CM | POA: Diagnosis not present

## 2019-03-16 DIAGNOSIS — N2581 Secondary hyperparathyroidism of renal origin: Secondary | ICD-10-CM | POA: Diagnosis not present

## 2019-03-16 DIAGNOSIS — D631 Anemia in chronic kidney disease: Secondary | ICD-10-CM | POA: Diagnosis not present

## 2019-03-16 DIAGNOSIS — E875 Hyperkalemia: Secondary | ICD-10-CM | POA: Diagnosis not present

## 2019-03-17 ENCOUNTER — Telehealth (HOSPITAL_COMMUNITY): Payer: Self-pay | Admitting: *Deleted

## 2019-03-18 DIAGNOSIS — D509 Iron deficiency anemia, unspecified: Secondary | ICD-10-CM | POA: Diagnosis not present

## 2019-03-18 DIAGNOSIS — N186 End stage renal disease: Secondary | ICD-10-CM | POA: Diagnosis not present

## 2019-03-18 DIAGNOSIS — E875 Hyperkalemia: Secondary | ICD-10-CM | POA: Diagnosis not present

## 2019-03-18 DIAGNOSIS — N2581 Secondary hyperparathyroidism of renal origin: Secondary | ICD-10-CM | POA: Diagnosis not present

## 2019-03-18 DIAGNOSIS — D631 Anemia in chronic kidney disease: Secondary | ICD-10-CM | POA: Diagnosis not present

## 2019-03-20 ENCOUNTER — Other Ambulatory Visit: Payer: Self-pay

## 2019-03-20 ENCOUNTER — Ambulatory Visit (INDEPENDENT_AMBULATORY_CARE_PROVIDER_SITE_OTHER): Payer: Self-pay | Admitting: Surgery

## 2019-03-20 ENCOUNTER — Ambulatory Visit (HOSPITAL_COMMUNITY)
Admission: RE | Admit: 2019-03-20 | Discharge: 2019-03-20 | Disposition: A | Discharge status: Home / Self Care | Payer: Medicare Other | Source: Ambulatory Visit | Attending: Surgery | Admitting: Surgery

## 2019-03-20 ENCOUNTER — Encounter: Payer: Self-pay | Admitting: Surgery

## 2019-03-20 VITALS — BP 159/101 | HR 53 | Temp 97.4°F | Resp 20 | Ht 69.0 in | Wt 131.0 lb

## 2019-03-20 DIAGNOSIS — N186 End stage renal disease: Secondary | ICD-10-CM | POA: Insufficient documentation

## 2019-03-20 DIAGNOSIS — Z992 Dependence on renal dialysis: Secondary | ICD-10-CM | POA: Diagnosis not present

## 2019-03-20 NOTE — H&P (View-Only) (Signed)
[name] MRN: [id number] DOB: [birth year] Sex: male  REASON FOR VISIT:     pos top  HISTORY OF PRESENT ILLNESS:   Maurice Bell is a 63 y.o. male who is status post for stage right basilic vein fistula creation.    CURRENT MEDICATIONS:    Current Outpatient Medications  Medication Sig Dispense Refill  . amLODipine (NORVASC) 10 MG tablet Take 1 tablet (10 mg total) by mouth at bedtime. 30 tablet 5  . cloNIDine (CATAPRES) 0.1 MG tablet Take 1 tablet (0.1 mg total) by mouth 2 (two) times daily. (Patient taking differently: Take 0.1 mg by mouth 3 (three) times daily. ) 60 tablet 5  . doxycycline (ADOXA) 100 MG tablet Take 100 mg by mouth 2 (two) times daily. As directed at least 2 hours before or 2 hours after binders    . hydrALAZINE (APRESOLINE) 50 MG tablet Take 50 mg by mouth daily.    Marland Kitchen HYDROcodone-acetaminophen (NORCO/VICODIN) 5-325 MG tablet Take 0.5 tablets by mouth every 12 (twelve) hours as needed. 4 tablet 0  . HYDROcodone-acetaminophen (NORCO/VICODIN) 5-325 MG tablet Take 1 tablet by mouth every 6 (six) hours as needed for moderate pain. 10 tablet 0  . metoprolol tartrate (LOPRESSOR) 50 MG tablet Take 150 mg by mouth 2 (two) times daily.    . multivitamin (RENA-VIT) TABS tablet Take 1 tablet by mouth at bedtime. 30 tablet 3  . Nutritional Supplements (FEEDING SUPPLEMENT, NEPRO CARB STEADY,) LIQD Take 237 mLs by mouth 2 (two) times daily between meals. 60 Can 0  . pregabalin (LYRICA) 25 MG capsule Take 1 capsule (25 mg total) by mouth daily. 30 capsule 1  . rOPINIRole (REQUIP) 0.5 MG tablet Take 1 tablet (0.5 mg total) by mouth daily. 30 tablet 3  . sevelamer carbonate (RENVELA) 800 MG tablet Take 4 tablets (3,200 mg total) by mouth 3 (three) times daily with meals. 180 tablet 5  . thiamine 100 MG tablet Take 1 tablet (100 mg total) by mouth daily. 30 tablet 3  . UNABLE TO FIND BP monitor  Diagnosis: Hypertension 1  Mutually Defined 0  . valACYclovir (VALTREX) 500 MG tablet Take 500 mg by mouth daily.  9  . vitamin B-12 2000 MCG tablet Take 1 tablet (2,000 mcg total) by mouth daily. 30 tablet 3   Current Facility-Administered Medications  Medication Dose Route Frequency Provider Last Rate Last Dose  . sodium chloride flush (NS) 0.9 % injection 3 mL  3 mL Intravenous PRN Marty Heck, MD        REVIEW OF SYSTEMS:   [X]  denotes positive finding, [ ]  denotes negative finding Cardiac  Comments:  Chest pain or chest pressure:    Shortness of breath upon exertion:    Short of breath when lying flat:    Irregular heart rhythm:    Constitutional    Fever or chills:      PHYSICAL EXAM:   Vitals:   03/20/19 1159  BP: (!) 159/101  Pulse: (!) 53  Resp: 20  Temp: (!) 97.4 F (36.3 C)  SpO2: 95%  Weight: 59.4 kg  Height: 5\' 9"  (1.753 m)    GENERAL: The patient is a well-nourished male, in no acute distress. The vital signs are documented above. CARDIOVASCULAR: There is a regular rate and rhythm. PULMONARY: Non-labored respirations Excellent thrill in the fistula  STUDIES:   +-------------------+----------+-------------+----------+-------------+ OUTFLOW VEIN       PSV (cm/s)Diameter (cm)Depth (cm)  Describe    +-------------------+----------+-------------+----------+-------------+ Dist UA  mid basilic   263        3.35        0.37       joins     +-------------------+----------+-------------+----------+-------------+ Dist UA               944        0.28        0.82   audible bruit +-------------------+----------+-------------+----------+-------------+ Dist UA               611        0.34        0.57   audible bruie +-------------------+----------+-------------+----------+-------------+    MEDICAL ISSUES:   End-stage renal disease: The patient has a good thrill within his fistula however ultrasound does not show that it has developed appropriately.  I  think the next step is to proceed with a fistulogram.  I have scheduled this for Friday, May 22.  I will cannulate the fistula near the incision, and perform the fistulogram and intervene as necessary.  Leia Alf, MD, FACS [facility] [phone] Pager [phone]

## 2019-03-20 NOTE — Progress Notes (Signed)
[name] MRN: [id number] DOB: 05-09-1956 Sex: male  REASON FOR VISIT:     pos top  HISTORY OF PRESENT ILLNESS:   Maurice Bell is a 63 y.o. male who is status post for stage right basilic vein fistula creation.    CURRENT MEDICATIONS:    Current Outpatient Medications  Medication Sig Dispense Refill  . amLODipine (NORVASC) 10 MG tablet Take 1 tablet (10 mg total) by mouth at bedtime. 30 tablet 5  . cloNIDine (CATAPRES) 0.1 MG tablet Take 1 tablet (0.1 mg total) by mouth 2 (two) times daily. (Patient taking differently: Take 0.1 mg by mouth 3 (three) times daily. ) 60 tablet 5  . doxycycline (ADOXA) 100 MG tablet Take 100 mg by mouth 2 (two) times daily. As directed at least 2 hours before or 2 hours after binders    . hydrALAZINE (APRESOLINE) 50 MG tablet Take 50 mg by mouth daily.    Marland Kitchen HYDROcodone-acetaminophen (NORCO/VICODIN) 5-325 MG tablet Take 0.5 tablets by mouth every 12 (twelve) hours as needed. 4 tablet 0  . HYDROcodone-acetaminophen (NORCO/VICODIN) 5-325 MG tablet Take 1 tablet by mouth every 6 (six) hours as needed for moderate pain. 10 tablet 0  . metoprolol tartrate (LOPRESSOR) 50 MG tablet Take 150 mg by mouth 2 (two) times daily.    . multivitamin (RENA-VIT) TABS tablet Take 1 tablet by mouth at bedtime. 30 tablet 3  . Nutritional Supplements (FEEDING SUPPLEMENT, NEPRO CARB STEADY,) LIQD Take 237 mLs by mouth 2 (two) times daily between meals. 60 Can 0  . pregabalin (LYRICA) 25 MG capsule Take 1 capsule (25 mg total) by mouth daily. 30 capsule 1  . rOPINIRole (REQUIP) 0.5 MG tablet Take 1 tablet (0.5 mg total) by mouth daily. 30 tablet 3  . sevelamer carbonate (RENVELA) 800 MG tablet Take 4 tablets (3,200 mg total) by mouth 3 (three) times daily with meals. 180 tablet 5  . thiamine 100 MG tablet Take 1 tablet (100 mg total) by mouth daily. 30 tablet 3  . UNABLE TO FIND BP monitor  Diagnosis: Hypertension 1  Mutually Defined 0  . valACYclovir (VALTREX) 500 MG tablet Take 500 mg by mouth daily.  9  . vitamin B-12 2000 MCG tablet Take 1 tablet (2,000 mcg total) by mouth daily. 30 tablet 3   Current Facility-Administered Medications  Medication Dose Route Frequency Provider Last Rate Last Dose  . sodium chloride flush (NS) 0.9 % injection 3 mL  3 mL Intravenous PRN Marty Heck, MD        REVIEW OF SYSTEMS:   [X]  denotes positive finding, [ ]  denotes negative finding Cardiac  Comments:  Chest pain or chest pressure:    Shortness of breath upon exertion:    Short of breath when lying flat:    Irregular heart rhythm:    Constitutional    Fever or chills:      PHYSICAL EXAM:   Vitals:   03/20/19 1159  BP: (!) 159/101  Pulse: (!) 53  Resp: 20  Temp: (!) 97.4 F (36.3 C)  SpO2: 95%  Weight: 59.4 kg  Height: 5\' 9"  (1.753 m)    GENERAL: The patient is a well-nourished male, in no acute distress. The vital signs are documented above. CARDIOVASCULAR: There is a regular rate and rhythm. PULMONARY: Non-labored respirations Excellent thrill in the fistula  STUDIES:   +-------------------+----------+-------------+----------+-------------+ OUTFLOW VEIN       PSV (cm/s)Diameter (cm)Depth (cm)  Describe    +-------------------+----------+-------------+----------+-------------+ Dist UA  mid basilic   972        8.20        0.37       joins     +-------------------+----------+-------------+----------+-------------+ Dist UA               944        0.28        0.82   audible bruit +-------------------+----------+-------------+----------+-------------+ Dist UA               611        0.34        0.57   audible bruie +-------------------+----------+-------------+----------+-------------+    MEDICAL ISSUES:   End-stage renal disease: The patient has a good thrill within his fistula however ultrasound does not show that it has developed appropriately.  I  think the next step is to proceed with a fistulogram.  I have scheduled this for Friday, May 22.  I will cannulate the fistula near the incision, and perform the fistulogram and intervene as necessary.  Leia Alf, MD, FACS [facility] [phone] Pager [phone]

## 2019-03-20 NOTE — H&P (View-Only) (Signed)
Patient name: Maurizio Geno MRN: [id number] DOB: [birth year] Sex: male  REASON FOR VISIT:     pos top  HISTORY OF PRESENT ILLNESS:   [name] is a 63 y.o. male who is status post for stage right basilic vein fistula creation.    CURRENT MEDICATIONS:    Current Outpatient Medications  Medication Sig Dispense Refill  . amLODipine (NORVASC) 10 MG tablet Take 1 tablet (10 mg total) by mouth at bedtime. 30 tablet 5  . cloNIDine (CATAPRES) 0.1 MG tablet Take 1 tablet (0.1 mg total) by mouth 2 (two) times daily. (Patient taking differently: Take 0.1 mg by mouth 3 (three) times daily. ) 60 tablet 5  . doxycycline (ADOXA) 100 MG tablet Take 100 mg by mouth 2 (two) times daily. As directed at least 2 hours before or 2 hours after binders    . hydrALAZINE (APRESOLINE) 50 MG tablet Take 50 mg by mouth daily.    Marland Kitchen HYDROcodone-acetaminophen (NORCO/VICODIN) 5-325 MG tablet Take 0.5 tablets by mouth every 12 (twelve) hours as needed. 4 tablet 0  . HYDROcodone-acetaminophen (NORCO/VICODIN) 5-325 MG tablet Take 1 tablet by mouth every 6 (six) hours as needed for moderate pain. 10 tablet 0  . metoprolol tartrate (LOPRESSOR) 50 MG tablet Take 150 mg by mouth 2 (two) times daily.    . multivitamin (RENA-VIT) TABS tablet Take 1 tablet by mouth at bedtime. 30 tablet 3  . Nutritional Supplements (FEEDING SUPPLEMENT, NEPRO CARB STEADY,) LIQD Take 237 mLs by mouth 2 (two) times daily between meals. 60 Can 0  . pregabalin (LYRICA) 25 MG capsule Take 1 capsule (25 mg total) by mouth daily. 30 capsule 1  . rOPINIRole (REQUIP) 0.5 MG tablet Take 1 tablet (0.5 mg total) by mouth daily. 30 tablet 3  . sevelamer carbonate (RENVELA) 800 MG tablet Take 4 tablets (3,200 mg total) by mouth 3 (three) times daily with meals. 180 tablet 5  . thiamine 100 MG tablet Take 1 tablet (100 mg total) by mouth daily. 30 tablet 3  . UNABLE TO FIND BP monitor  Diagnosis: Hypertension 1  Mutually Defined 0  . valACYclovir (VALTREX) 500 MG tablet Take 500 mg by mouth daily.  9  . vitamin B-12 2000 MCG tablet Take 1 tablet (2,000 mcg total) by mouth daily. 30 tablet 3   Current Facility-Administered Medications  Medication Dose Route Frequency Provider Last Rate Last Dose  . sodium chloride flush (NS) 0.9 % injection 3 mL  3 mL Intravenous PRN Marty Heck, MD        REVIEW OF SYSTEMS:   [X]  denotes positive finding, [ ]  denotes negative finding Cardiac  Comments:  Chest pain or chest pressure:    Shortness of breath upon exertion:    Short of breath when lying flat:    Irregular heart rhythm:    Constitutional    Fever or chills:      PHYSICAL EXAM:   Vitals:   03/20/19 1159  BP: (!) 159/101  Pulse: (!) 53  Resp: 20  Temp: (!) 97.4 F (36.3 C)  SpO2: 95%  Weight: 59.4 kg  Height: 5\' 9"  (1.753 m)    GENERAL: The patient is a well-nourished male, in no acute distress. The vital signs are documented above. CARDIOVASCULAR: There is a regular rate and rhythm. PULMONARY: Non-labored respirations Excellent thrill in the fistula  STUDIES:   +-------------------+----------+-------------+----------+-------------+ OUTFLOW VEIN       PSV (cm/s)Diameter (cm)Depth (cm)  Describe    +-------------------+----------+-------------+----------+-------------+ Dist UA  mid basilic   161        0.96        0.37       joins     +-------------------+----------+-------------+----------+-------------+ Dist UA               944        0.28        0.82   audible bruit +-------------------+----------+-------------+----------+-------------+ Dist UA               611        0.34        0.57   audible bruie +-------------------+----------+-------------+----------+-------------+    MEDICAL ISSUES:   End-stage renal disease: The patient has a good thrill within his fistula however ultrasound does not show that it has developed appropriately.  I  think the next step is to proceed with a fistulogram.  I have scheduled this for Friday, May 22.  I will cannulate the fistula near the incision, and perform the fistulogram and intervene as necessary.  Leia Alf, MD, FACS [facility] [phone] Pager [phone]

## 2019-03-21 ENCOUNTER — Other Ambulatory Visit (HOSPITAL_COMMUNITY)
Admission: RE | Admit: 2019-03-21 | Discharge: 2019-03-21 | Disposition: A | Discharge status: Home / Self Care | Payer: Medicare Other | Source: Ambulatory Visit | Attending: Surgery | Admitting: Surgery

## 2019-03-21 DIAGNOSIS — E875 Hyperkalemia: Secondary | ICD-10-CM | POA: Diagnosis not present

## 2019-03-21 DIAGNOSIS — N186 End stage renal disease: Secondary | ICD-10-CM | POA: Diagnosis not present

## 2019-03-21 DIAGNOSIS — N2581 Secondary hyperparathyroidism of renal origin: Secondary | ICD-10-CM | POA: Diagnosis not present

## 2019-03-21 DIAGNOSIS — D509 Iron deficiency anemia, unspecified: Secondary | ICD-10-CM | POA: Diagnosis not present

## 2019-03-21 DIAGNOSIS — D631 Anemia in chronic kidney disease: Secondary | ICD-10-CM | POA: Diagnosis not present

## 2019-03-21 DIAGNOSIS — Z1159 Encounter for screening for other viral diseases: Secondary | ICD-10-CM | POA: Diagnosis not present

## 2019-03-22 LAB — NOVEL CORONAVIRUS, NAA (HOSP ORDER, SEND-OUT TO REF LAB; TAT 18-24 HRS): SARS-CoV-2, NAA: NOT DETECTED

## 2019-03-23 DIAGNOSIS — D509 Iron deficiency anemia, unspecified: Secondary | ICD-10-CM | POA: Diagnosis not present

## 2019-03-23 DIAGNOSIS — N186 End stage renal disease: Secondary | ICD-10-CM | POA: Diagnosis not present

## 2019-03-23 DIAGNOSIS — N2581 Secondary hyperparathyroidism of renal origin: Secondary | ICD-10-CM | POA: Diagnosis not present

## 2019-03-23 DIAGNOSIS — E875 Hyperkalemia: Secondary | ICD-10-CM | POA: Diagnosis not present

## 2019-03-23 DIAGNOSIS — D631 Anemia in chronic kidney disease: Secondary | ICD-10-CM | POA: Diagnosis not present

## 2019-03-24 ENCOUNTER — Other Ambulatory Visit: Payer: Self-pay

## 2019-03-24 ENCOUNTER — Ambulatory Visit (HOSPITAL_COMMUNITY)
Admission: RE | Admit: 2019-03-24 | Discharge: 2019-03-24 | Disposition: A | Discharge status: Home / Self Care | Payer: Medicare Other | Attending: Vascular Surgery | Admitting: Vascular Surgery

## 2019-03-24 ENCOUNTER — Other Ambulatory Visit: Payer: Self-pay | Admitting: *Deleted

## 2019-03-24 ENCOUNTER — Encounter (HOSPITAL_COMMUNITY)
Admission: RE | Disposition: A | Discharge status: Home / Self Care | Payer: Self-pay | Source: Home / Self Care | Attending: Vascular Surgery

## 2019-03-24 ENCOUNTER — Encounter (HOSPITAL_COMMUNITY): Payer: Self-pay | Admitting: Vascular Surgery

## 2019-03-24 DIAGNOSIS — Z992 Dependence on renal dialysis: Secondary | ICD-10-CM | POA: Insufficient documentation

## 2019-03-24 DIAGNOSIS — Z79899 Other long term (current) drug therapy: Secondary | ICD-10-CM | POA: Diagnosis not present

## 2019-03-24 DIAGNOSIS — T82898A Other specified complication of vascular prosthetic devices, implants and grafts, initial encounter: Secondary | ICD-10-CM | POA: Diagnosis not present

## 2019-03-24 DIAGNOSIS — N186 End stage renal disease: Secondary | ICD-10-CM | POA: Diagnosis present

## 2019-03-24 DIAGNOSIS — I120 Hypertensive chronic kidney disease with stage 5 chronic kidney disease or end stage renal disease: Secondary | ICD-10-CM | POA: Diagnosis not present

## 2019-03-24 HISTORY — PX: A/V FISTULAGRAM: CATH118298

## 2019-03-24 LAB — POCT I-STAT, CHEM 8
BUN: 35 mg/dL — ABNORMAL HIGH (ref 6–20)
Calcium, Ion: 1.03 mmol/L — ABNORMAL LOW (ref 1.15–1.40)
Chloride: 98 mmol/L (ref 98–111)
Creatinine, Ser: 8.8 mg/dL — ABNORMAL HIGH (ref 0.61–1.24)
Glucose, Bld: 79 mg/dL (ref 70–99)
HCT: 44 % (ref 39.0–52.0)
Hemoglobin: 15 g/dL (ref 13.0–17.0)
Potassium: 3.9 mmol/L (ref 3.5–5.1)
Sodium: 135 mmol/L (ref 135–145)
TCO2: 24 mmol/L (ref 22–32)

## 2019-03-24 SURGERY — A/V FISTULAGRAM
Anesthesia: LOCAL

## 2019-03-24 MED ORDER — FENTANYL CITRATE (PF) 100 MCG/2ML IJ SOLN
INTRAMUSCULAR | Status: DC | PRN
  Administered 2019-03-24: 50 ug via INTRAVENOUS

## 2019-03-24 MED ORDER — SODIUM CHLORIDE 0.9% FLUSH: 3.0000 mL | Freq: Two times a day (BID) | INTRAVENOUS | Status: DC

## 2019-03-24 MED ORDER — LIDOCAINE HCL (PF) 1 % IJ SOLN
INTRAMUSCULAR | Status: AC
  Filled 2019-03-24: qty 30

## 2019-03-24 MED ORDER — HEPARIN (PORCINE) IN NACL 1000-0.9 UT/500ML-% IV SOLN
INTRAVENOUS | Status: AC
  Filled 2019-03-24: qty 500

## 2019-03-24 MED ORDER — SODIUM CHLORIDE 0.9 % IV SOLN: 250.0000 mL | INTRAVENOUS | Status: DC | PRN

## 2019-03-24 MED ORDER — HEPARIN (PORCINE) IN NACL 1000-0.9 UT/500ML-% IV SOLN
INTRAVENOUS | Status: DC | PRN
  Administered 2019-03-24: 500 mL

## 2019-03-24 MED ORDER — FENTANYL CITRATE (PF) 100 MCG/2ML IJ SOLN
INTRAMUSCULAR | Status: AC
  Filled 2019-03-24: qty 2

## 2019-03-24 MED ORDER — MIDAZOLAM HCL 2 MG/2ML IJ SOLN
INTRAMUSCULAR | Status: AC
  Filled 2019-03-24: qty 2

## 2019-03-24 MED ORDER — IODIXANOL 320 MG/ML IV SOLN
INTRAVENOUS | Status: DC | PRN
  Administered 2019-03-24: 08:00:00 20 mL via INTRAVENOUS

## 2019-03-24 MED ORDER — LIDOCAINE HCL (PF) 1 % IJ SOLN
INTRAMUSCULAR | Status: DC | PRN
  Administered 2019-03-24: 10 mL

## 2019-03-24 MED ORDER — SODIUM CHLORIDE 0.9% FLUSH: 3.0000 mL | INTRAVENOUS | Status: DC | PRN

## 2019-03-24 MED ORDER — MIDAZOLAM HCL 2 MG/2ML IJ SOLN
INTRAMUSCULAR | Status: DC | PRN
  Administered 2019-03-24: 1 mg via INTRAVENOUS

## 2019-03-24 SURGICAL SUPPLY — 9 items
BAG SNAP BAND KOVER 36X36 (MISCELLANEOUS) ×2 IMPLANT
COVER DOME SNAP 22 D (MISCELLANEOUS) ×2 IMPLANT
KIT MICROPUNCTURE NIT STIFF (SHEATH) ×1 IMPLANT
PROTECTION STATION PRESSURIZED (MISCELLANEOUS) ×2
SHEATH PROBE COVER 6X72 (BAG) ×3 IMPLANT
STATION PROTECTION PRESSURIZED (MISCELLANEOUS) ×1 IMPLANT
STOPCOCK MORSE 400PSI 3WAY (MISCELLANEOUS) ×2 IMPLANT
TRAY PV CATH (CUSTOM PROCEDURE TRAY) ×2 IMPLANT
TUBING CIL FLEX 10 FLL-RA (TUBING) ×2 IMPLANT

## 2019-03-24 NOTE — Progress Notes (Signed)
Notified Anderson Malta of K 7.9 on Istat.  Lab to redraw Istat and will send specimen to main lab if K still high.

## 2019-03-24 NOTE — Interval H&P Note (Signed)
History and Physical Interval Note:  03/24/2019 7:28 AM  Maurice Bell  has presented today for surgery, with the diagnosis of instage renal.  The various methods of treatment have been discussed with the patient and family. After consideration of risks, benefits and other options for treatment, the patient has consented to  Procedure(s): A/V FISTULAGRAM - right arm (N/A) as a surgical intervention.  The patient's history has been reviewed, patient examined, no change in status, stable for surgery.  I have reviewed the patient's chart and labs.  Questions were answered to the patient's satisfaction.     Deitra Mayo

## 2019-03-24 NOTE — Op Note (Signed)
   PATIENT: Maurice Bell      MRN: [id number] DOB: [birth year]    DATE OF PROCEDURE: 03/24/2019  INDICATIONS:    Maurice Bell is a 63 y.o. male who had a first stage right basilic vein fistula placed by Dr. Trula Slade on 02/15/2019.  Patient was seen for a follow-up visit on 03/20/2019.  He had an excellent thrill in his first stage basilic vein transposition.  There were elevated velocities in the proximal fistula and he was set up for a fistulogram.  PROCEDURE:    Ultrasound-guided access of right basilic vein transposition Fistulogram right for stage basilic vein transposition  SURGEON: Judeth Cornfield. Scot Dock, MD, FACS  ANESTHESIA: Local  EBL: Minimal  TECHNIQUE: The patient was brought to the peripheral vascular lab.  The right arm was prepped and draped in usual sterile fashion.  The patient did receive 1 mg of Versed and 50 mcg of fentanyl at the beginning of the procedure.  Under ultrasound guidance, after the skin was anesthetized, the proximal fistula was cannulated with a micropuncture needle and a micropuncture sheath introduced over a wire.  A fistulogram was then obtained to evaluate the fistula to include the central veins.  Pressure was then held over the fistula in a retrograde great shot was done to examine the arterial anastomosis.  FINDINGS:   1.  There is no central venous stenosis. 2.  The fistula is widely patent with several branches noted. 3.  There is mild narrowing just beyond the arterial anastomosis.  CLINICAL NOTE: The only significant finding is some mild narrowing just beyond the anastomosis.  However this may not be clinically significant given that the patient has an excellent thrill in the fistula.  Regardless he will need the second stage of the procedure performed and I think if this is an issue this could be addressed at the time of the second stage basilic vein transposition.  The patient dialyzes on Tuesdays Thursdays and Saturdays I believe.   Deitra Mayo, MD, FACS [facility]  DATE OF DICTATION:   03/24/2019

## 2019-03-24 NOTE — Discharge Instructions (Signed)

## 2019-03-24 NOTE — Progress Notes (Signed)
Call to patient with instructions for surgery. Reviewed self-quarantine instructions pre-op. Instructed to be at [facility] admitting at 6:30 am on 03/29/2019. NPO past MN night prior and must have a driver and caregiver for discharge. Expect a call and follow the detailed surgery, medication and possible Covid testing instructions received from the hospital pre-admission department. Read back and verbalized understanding.

## 2019-03-25 DIAGNOSIS — D509 Iron deficiency anemia, unspecified: Secondary | ICD-10-CM | POA: Diagnosis not present

## 2019-03-25 DIAGNOSIS — N2581 Secondary hyperparathyroidism of renal origin: Secondary | ICD-10-CM | POA: Diagnosis not present

## 2019-03-25 DIAGNOSIS — D631 Anemia in chronic kidney disease: Secondary | ICD-10-CM | POA: Diagnosis not present

## 2019-03-25 DIAGNOSIS — E875 Hyperkalemia: Secondary | ICD-10-CM | POA: Diagnosis not present

## 2019-03-25 DIAGNOSIS — N186 End stage renal disease: Secondary | ICD-10-CM | POA: Diagnosis not present

## 2019-03-28 ENCOUNTER — Encounter (HOSPITAL_COMMUNITY): Payer: Self-pay | Admitting: Vascular Surgery

## 2019-03-28 ENCOUNTER — Other Ambulatory Visit: Payer: Self-pay

## 2019-03-28 DIAGNOSIS — D631 Anemia in chronic kidney disease: Secondary | ICD-10-CM | POA: Diagnosis not present

## 2019-03-28 DIAGNOSIS — E875 Hyperkalemia: Secondary | ICD-10-CM | POA: Diagnosis not present

## 2019-03-28 DIAGNOSIS — N2581 Secondary hyperparathyroidism of renal origin: Secondary | ICD-10-CM | POA: Diagnosis not present

## 2019-03-28 DIAGNOSIS — N186 End stage renal disease: Secondary | ICD-10-CM | POA: Diagnosis not present

## 2019-03-28 DIAGNOSIS — D509 Iron deficiency anemia, unspecified: Secondary | ICD-10-CM | POA: Diagnosis not present

## 2019-03-28 LAB — POCT I-STAT 4, (NA,K, GLUC, HGB,HCT)
Glucose, Bld: 77 mg/dL (ref 70–99)
HCT: 42 % (ref 39.0–52.0)
Hemoglobin: 14.3 g/dL (ref 13.0–17.0)
Potassium: 7.9 mmol/L (ref 3.5–5.1)
Sodium: 131 mmol/L — ABNORMAL LOW (ref 135–145)

## 2019-03-28 MED ORDER — PROMETHAZINE HCL 25 MG/ML IJ SOLN: 6.2500 mg | INTRAMUSCULAR | Status: DC | PRN

## 2019-03-28 MED ORDER — FENTANYL CITRATE (PF) 100 MCG/2ML IJ SOLN
25.0000 ug | INTRAMUSCULAR | Status: DC | PRN
  Administered 2019-03-29 (×4): 25 ug via INTRAVENOUS

## 2019-03-28 NOTE — Progress Notes (Signed)
Patient denies that he nor her family has experienced any of the following: Cough Fever >100.4 Runny Nose Sore Throat Difficulty breathing/ shortness of breath Travel in past 14 days- no Mr Hassel Neth reports that he has not been anywhere except dialysis, since COVID swab which was done 5/19.  Patient will have to be swabbed again DOS.  Patient reports that he has pink eye and has for 4-5 days. Patinet said it was seen by PA at [facility] and that he was instructed to not reub it.  I notified Kay at Dr. Stephens Shire office - she said it will be ok since he is has 2nd stage fistula surgery.

## 2019-03-28 NOTE — Anesthesia Preprocedure Evaluation (Addendum)
Anesthesia Evaluation  Patient identified by MRN, date of birth, ID band Patient awake    Reviewed: Allergy & Precautions, NPO status , Patient's Chart, lab work & pertinent test results, reviewed documented beta blocker date and time   History of Anesthesia Complications Negative for: history of anesthetic complications  Airway Mallampati: II  TM Distance: >3 FB Neck ROM: Full    Dental no notable dental hx. (+) Missing, Dental Advisory Given   Pulmonary shortness of breath, former smoker,    Pulmonary exam normal        Cardiovascular hypertension, Pt. on medications and Pt. on home beta blockers Normal cardiovascular exam     Neuro/Psych PSYCHIATRIC DISORDERS Anxiety Depression CVA    GI/Hepatic GERD  ,(+) Hepatitis -, C  Endo/Other  negative endocrine ROS  Renal/GU ESRF and DialysisRenal disease     Musculoskeletal  (+) Arthritis ,   Abdominal   Peds  Hematology  (+) anemia ,   Anesthesia Other Findings   Reproductive/Obstetrics                            Anesthesia Physical  Anesthesia Plan  ASA: III  Anesthesia Plan: MAC   Post-op Pain Management:    Induction: Intravenous  PONV Risk Score and Plan: 2 and Ondansetron, Treatment may vary due to age or medical condition and Propofol infusion  Airway Management Planned:   Additional Equipment:   Intra-op Plan:   Post-operative Plan:   Informed Consent: I have reviewed the patients History and Physical, chart, labs and discussed the procedure including the risks, benefits and alternatives for the proposed anesthesia with the patient or authorized representative who has indicated his/her understanding and acceptance.     Dental advisory given  Plan Discussed with: CRNA, Anesthesiologist and Surgeon  Anesthesia Plan Comments:        Anesthesia Quick Evaluation

## 2019-03-29 ENCOUNTER — Ambulatory Visit (HOSPITAL_COMMUNITY)
Admission: RE | Admit: 2019-03-29 | Discharge: 2019-03-29 | Disposition: A | Discharge status: Home / Self Care | Payer: Medicare Other | Attending: Surgery | Admitting: Surgery

## 2019-03-29 ENCOUNTER — Other Ambulatory Visit: Payer: Self-pay

## 2019-03-29 ENCOUNTER — Ambulatory Visit (HOSPITAL_COMMUNITY): Payer: Medicare Other | Admitting: Anesthesiology

## 2019-03-29 ENCOUNTER — Encounter (HOSPITAL_COMMUNITY)
Admission: RE | Disposition: A | Discharge status: Home / Self Care | Payer: Self-pay | Source: Home / Self Care | Attending: Surgery

## 2019-03-29 ENCOUNTER — Encounter (HOSPITAL_COMMUNITY): Payer: Self-pay

## 2019-03-29 DIAGNOSIS — N186 End stage renal disease: Secondary | ICD-10-CM | POA: Diagnosis not present

## 2019-03-29 DIAGNOSIS — D649 Anemia, unspecified: Secondary | ICD-10-CM | POA: Diagnosis not present

## 2019-03-29 DIAGNOSIS — Z79899 Other long term (current) drug therapy: Secondary | ICD-10-CM | POA: Diagnosis not present

## 2019-03-29 DIAGNOSIS — Z87891 Personal history of nicotine dependence: Secondary | ICD-10-CM | POA: Diagnosis not present

## 2019-03-29 DIAGNOSIS — I120 Hypertensive chronic kidney disease with stage 5 chronic kidney disease or end stage renal disease: Secondary | ICD-10-CM | POA: Insufficient documentation

## 2019-03-29 DIAGNOSIS — Z1159 Encounter for screening for other viral diseases: Secondary | ICD-10-CM | POA: Insufficient documentation

## 2019-03-29 DIAGNOSIS — Z992 Dependence on renal dialysis: Secondary | ICD-10-CM | POA: Diagnosis not present

## 2019-03-29 DIAGNOSIS — N185 Chronic kidney disease, stage 5: Secondary | ICD-10-CM | POA: Diagnosis not present

## 2019-03-29 HISTORY — PX: BASCILIC VEIN TRANSPOSITION: SHX5742

## 2019-03-29 LAB — POCT I-STAT 4, (NA,K, GLUC, HGB,HCT)
Glucose, Bld: 89 mg/dL (ref 70–99)
HCT: 38 % — ABNORMAL LOW (ref 39.0–52.0)
Hemoglobin: 12.9 g/dL — ABNORMAL LOW (ref 13.0–17.0)
Potassium: 4.5 mmol/L (ref 3.5–5.1)
Sodium: 136 mmol/L (ref 135–145)

## 2019-03-29 LAB — SARS CORONAVIRUS 2 BY RT PCR (HOSPITAL ORDER, PERFORMED IN ~~LOC~~ HOSPITAL LAB): SARS Coronavirus 2: NEGATIVE

## 2019-03-29 SURGERY — TRANSPOSITION, VEIN, BASILIC
Anesthesia: Monitor Anesthesia Care | Laterality: Right

## 2019-03-29 MED ORDER — LIDOCAINE-EPINEPHRINE (PF) 1 %-1:200000 IJ SOLN
INTRAMUSCULAR | Status: AC
  Filled 2019-03-29: qty 30

## 2019-03-29 MED ORDER — LIDOCAINE-EPINEPHRINE (PF) 1 %-1:200000 IJ SOLN
INTRAMUSCULAR | Status: DC | PRN
  Administered 2019-03-29: 20 mL

## 2019-03-29 MED ORDER — FENTANYL CITRATE (PF) 100 MCG/2ML IJ SOLN: 25.0000 ug | INTRAMUSCULAR | Status: DC | PRN

## 2019-03-29 MED ORDER — SODIUM CHLORIDE 0.9 % IV SOLN
INTRAVENOUS | Status: AC
  Filled 2019-03-29: qty 1.2

## 2019-03-29 MED ORDER — PHENYLEPHRINE HCL-NACL 10-0.9 MG/250ML-% IV SOLN
INTRAVENOUS | Status: AC
  Filled 2019-03-29: qty 250

## 2019-03-29 MED ORDER — PROPOFOL 1000 MG/100ML IV EMUL
INTRAVENOUS | Status: AC
  Filled 2019-03-29: qty 200

## 2019-03-29 MED ORDER — OXYCODONE-ACETAMINOPHEN 5-325 MG PO TABS: 1.0000 | ORAL_TABLET | Freq: Four times a day (QID) | ORAL | 0 refills | Status: DC | PRN

## 2019-03-29 MED ORDER — LIDOCAINE HCL (CARDIAC) PF 100 MG/5ML IV SOSY
PREFILLED_SYRINGE | INTRAVENOUS | Status: DC | PRN
  Administered 2019-03-29: 30 mg via INTRATRACHEAL

## 2019-03-29 MED ORDER — ONDANSETRON HCL 4 MG/2ML IJ SOLN
INTRAMUSCULAR | Status: DC | PRN
  Administered 2019-03-29: 4 mg via INTRAVENOUS

## 2019-03-29 MED ORDER — MIDAZOLAM HCL 2 MG/2ML IJ SOLN
INTRAMUSCULAR | Status: AC
  Filled 2019-03-29: qty 2

## 2019-03-29 MED ORDER — CHLORHEXIDINE GLUCONATE 4 % EX LIQD: 60.0000 mL | Freq: Once | CUTANEOUS | Status: DC

## 2019-03-29 MED ORDER — PROPOFOL 10 MG/ML IV BOLUS
INTRAVENOUS | Status: AC
  Filled 2019-03-29: qty 20

## 2019-03-29 MED ORDER — MIDAZOLAM HCL 5 MG/5ML IJ SOLN
INTRAMUSCULAR | Status: DC | PRN
  Administered 2019-03-29: 0.5 mg via INTRAVENOUS
  Administered 2019-03-29: 1 mg via INTRAVENOUS

## 2019-03-29 MED ORDER — SODIUM CHLORIDE 0.9 % IV SOLN
INTRAVENOUS | Status: DC | PRN
  Administered 2019-03-29: 500 mL

## 2019-03-29 MED ORDER — FENTANYL CITRATE (PF) 250 MCG/5ML IJ SOLN
INTRAMUSCULAR | Status: AC
  Filled 2019-03-29: qty 5

## 2019-03-29 MED ORDER — PHENYLEPHRINE 40 MCG/ML (10ML) SYRINGE FOR IV PUSH (FOR BLOOD PRESSURE SUPPORT)
PREFILLED_SYRINGE | INTRAVENOUS | Status: AC
  Filled 2019-03-29: qty 10

## 2019-03-29 MED ORDER — CEFAZOLIN SODIUM-DEXTROSE 2-4 GM/100ML-% IV SOLN
2.0000 g | INTRAVENOUS | Status: AC
  Administered 2019-03-29: 2 g via INTRAVENOUS
  Filled 2019-03-29: qty 100

## 2019-03-29 MED ORDER — PROMETHAZINE HCL 25 MG/ML IJ SOLN: 6.2500 mg | INTRAMUSCULAR | Status: DC | PRN

## 2019-03-29 MED ORDER — SODIUM CHLORIDE 0.9 % IV SOLN: INTRAVENOUS | Status: DC

## 2019-03-29 MED ORDER — ACETAMINOPHEN 500 MG PO TABS
1000.0000 mg | ORAL_TABLET | Freq: Once | ORAL | Status: DC
  Filled 2019-03-29: qty 2

## 2019-03-29 MED ORDER — 0.9 % SODIUM CHLORIDE (POUR BTL) OPTIME
TOPICAL | Status: DC | PRN
  Administered 2019-03-29: 1000 mL

## 2019-03-29 MED ORDER — PROPOFOL 500 MG/50ML IV EMUL
INTRAVENOUS | Status: DC | PRN
  Administered 2019-03-29: 100 ug/kg/min via INTRAVENOUS

## 2019-03-29 SURGICAL SUPPLY — 37 items
ADH SKN CLS APL DERMABOND .7 (GAUZE/BANDAGES/DRESSINGS) ×1
ARMBAND PINK RESTRICT EXTREMIT (MISCELLANEOUS) ×3 IMPLANT
CANISTER SUCT 3000ML PPV (MISCELLANEOUS) ×3 IMPLANT
CLIP VESOCCLUDE MED 24/CT (CLIP) ×2 IMPLANT
CLIP VESOCCLUDE MED 6/CT (CLIP) ×2 IMPLANT
CLIP VESOCCLUDE SM WIDE 24/CT (CLIP) ×2 IMPLANT
CLIP VESOCCLUDE SM WIDE 6/CT (CLIP) ×2 IMPLANT
COVER PROBE W GEL 5X96 (DRAPES) ×3 IMPLANT
COVER WAND RF STERILE (DRAPES) ×1 IMPLANT
DECANTER SPIKE VIAL GLASS SM (MISCELLANEOUS) ×2 IMPLANT
DERMABOND ADVANCED (GAUZE/BANDAGES/DRESSINGS) ×2
DERMABOND ADVANCED .7 DNX12 (GAUZE/BANDAGES/DRESSINGS) ×1 IMPLANT
ELECT REM PT RETURN 9FT ADLT (ELECTROSURGICAL) ×3
ELECTRODE REM PT RTRN 9FT ADLT (ELECTROSURGICAL) ×1 IMPLANT
GLOVE BIOGEL PI IND STRL 6.5 (GLOVE) IMPLANT
GLOVE BIOGEL PI IND STRL 7.5 (GLOVE) ×1 IMPLANT
GLOVE BIOGEL PI INDICATOR 6.5 (GLOVE) ×2
GLOVE BIOGEL PI INDICATOR 7.5 (GLOVE) ×2
GLOVE SURG SS PI 7.5 STRL IVOR (GLOVE) ×3 IMPLANT
GOWN STRL REUS W/ TWL LRG LVL3 (GOWN DISPOSABLE) ×2 IMPLANT
GOWN STRL REUS W/ TWL XL LVL3 (GOWN DISPOSABLE) ×1 IMPLANT
GOWN STRL REUS W/TWL LRG LVL3 (GOWN DISPOSABLE) ×9
GOWN STRL REUS W/TWL XL LVL3 (GOWN DISPOSABLE) ×3
HEMOSTAT SNOW SURGICEL 2X4 (HEMOSTASIS) IMPLANT
KIT BASIN OR (CUSTOM PROCEDURE TRAY) ×3 IMPLANT
KIT TURNOVER KIT B (KITS) ×3 IMPLANT
NS IRRIG 1000ML POUR BTL (IV SOLUTION) ×3 IMPLANT
PACK CV ACCESS (CUSTOM PROCEDURE TRAY) ×3 IMPLANT
PAD ARMBOARD 7.5X6 YLW CONV (MISCELLANEOUS) ×6 IMPLANT
SUT PROLENE 6 0 CC (SUTURE) ×3 IMPLANT
SUT SILK 2 0 SH (SUTURE) ×2 IMPLANT
SUT VIC AB 3-0 SH 27 (SUTURE) ×6
SUT VIC AB 3-0 SH 27X BRD (SUTURE) ×1 IMPLANT
SUT VICRYL 4-0 PS2 18IN ABS (SUTURE) ×3 IMPLANT
TOWEL GREEN STERILE (TOWEL DISPOSABLE) ×3 IMPLANT
UNDERPAD 30X30 (UNDERPADS AND DIAPERS) ×3 IMPLANT
WATER STERILE IRR 1000ML POUR (IV SOLUTION) ×3 IMPLANT

## 2019-03-29 NOTE — Op Note (Signed)
    [name] MRN: [id number] DOB: 15-Jun-1956 Sex: male  03/29/2019 Pre-operative Diagnosis: ESRD Post-operative diagnosis:  Same Surgeon:  Annamarie Major Assistants: Aldona Bar Ryne Procedure:   Right second stage basilic vein fistula creation Anesthesia: MAC Blood Loss: Minimal Specimens: None  Findings: 6 mm basilic vein  Indications: The patient is here today for second stage basilic vein fistula creation.  He did have a fistulogram preoperatively which showed an adequate vein for elevation.  Procedure:  The patient was identified in the holding area and taken to [place] 10  The patient was then placed supine on the table. MAC anesthesia was administered.  The patient was prepped and draped in the usual sterile fashion.  A time out was called and antibiotics were administered.  Ultrasound was used to evaluate the basilic vein in the upper arm.  This was an excellent caliber vein, measuring 7 mm.  1% lidocaine was then used for local anesthesia.  Through 2 longitudinal skip incisions in the upper arm the basilic vein was circumferentially mobilized.  Side branches were ligated between silk ties.  The nerve was protected.  Once the vein was fully mobilized, I created a subcutaneous tunnel with a Gore tunneler.  The vein was then occluded with vascular clamps at the antecubital crease and in the axilla.  The vein was transected near the antecubital incision.  It was marked for orientation.  It distended nicely with heparin saline.  It was brought through the previously created tunnel, maintaining proper orientation.  A end to end anastomosis was then created.  This was done with 6-0 Prolene.  Once this was completed the clamps were released and there was excellent flow through the fistula with a good thrill.  Hemostasis was achieved.  The incision was closed with 2 layers of 3-0 Vicryl followed by Dermabond.  There were no immediate complications.   Disposition: To PACU  stable   V. Annamarie Major, M.D., [facility] [place] Office: [phone] Pager:  [phone]

## 2019-03-29 NOTE — Transfer of Care (Signed)
Immediate Anesthesia Transfer of Care Note  Patient: Maurice Bell  Procedure(s) Performed: SECOND STAGE BASILIC VEIN TRANSPOSITION RIGHT ARM (Right )  Patient Location: PACU  Anesthesia Type:MAC  Level of Consciousness: sedated  Airway & Oxygen Therapy: Patient connected to face mask oxygen  Post-op Assessment: Post -op Vital signs reviewed and stable  Post vital signs: stable  Last Vitals:  Vitals Value Taken Time  BP 100/49 03/29/2019 10:27 AM  Temp    Pulse 69 03/29/2019 10:29 AM  Resp 13 03/29/2019 10:29 AM  SpO2 96 % 03/29/2019 10:29 AM  Vitals shown include unvalidated device data.  Last Pain:  Vitals:   03/29/19 0600  TempSrc:   PainSc: 5          Complications: No apparent anesthesia complications

## 2019-03-29 NOTE — Discharge Instructions (Signed)
° °  [facility]  Discharge Instructions  AV Fistula or Graft Surgery for Dialysis Access  Please refer to the following instructions for your post-procedure care. Your surgeon or physician assistant will discuss any changes with you.  Activity  You may drive the day following your surgery, if you are comfortable and no longer taking prescription pain medication. Resume full activity as the soreness in your incision resolves.  Bathing/Showering  You may shower after you go home. Keep your incision dry for 48 hours. Do not soak in a bathtub, hot tub, or swim until the incision heals completely. You may not shower if you have a hemodialysis catheter.  Incision Care  Clean your incision with mild soap and water after 48 hours. Pat the area dry with a clean towel. You do not need a bandage unless otherwise instructed. Do not apply any ointments or creams to your incision. You may have skin glue on your incision. Do not peel it off. It will come off on its own in about one week. Your arm may swell a bit after surgery. To reduce swelling use pillows to elevate your arm so it is above your heart. Your doctor will tell you if you need to lightly wrap your arm with an ACE bandage.  Diet  Resume your normal diet. There are not special food restrictions following this procedure. In order to heal from your surgery, it is CRITICAL to get adequate nutrition. Your body requires vitamins, minerals, and protein. Vegetables are the best source of vitamins and minerals. Vegetables also provide the perfect balance of protein. Processed food has little nutritional value, so try to avoid this.  Medications  Resume taking all of your medications. If your incision is causing pain, you may take over-the counter pain relievers such as acetaminophen (Tylenol). If you were prescribed a stronger pain medication, please be aware these medications can cause nausea and constipation. Prevent  nausea by taking the medication with a snack or meal. Avoid constipation by drinking plenty of fluids and eating foods with high amount of fiber, such as fruits, vegetables, and grains.  Do not take Tylenol if you are taking prescription pain medications.  Follow up Your surgeon may want to see you in the office following your access surgery. If so, this will be arranged at the time of your surgery.  Please call us immediately for any of the following conditions:  Increased pain, redness, drainage (pus) from your incision site Fever of 101 degrees or higher Severe or worsening pain at your incision site Hand pain or numbness.  Reduce your risk of vascular disease:  Stop smoking. If you would like help, call [facility] at 1-800-QUIT-NOW 336-342-1175) or [place] at [place] your cholesterol Maintain a desired weight Control your diabetes Keep your blood pressure down  Dialysis  It will take several weeks to several months for your new dialysis access to be ready for use. Your surgeon will determine when it is okay to use it. Your nephrologist will continue to direct your dialysis. You can continue to use your Permcath until your new access is ready for use.   03/29/2019 Maurice Bell Maurice Bell  Surgeon(s): Maurice Mitchell, MD  Procedure(s): SECOND STAGE BASILIC VEIN TRANSPOSITION RIGHT ARM  x Do not stick fistula for 12 weeks    If you have any questions, please call the office at [phone].

## 2019-03-29 NOTE — Interval H&P Note (Signed)
History and Physical Interval Note:  03/29/2019 7:23 AM  Maurice Bell  has presented today for surgery, with the diagnosis of END STAGE RENAL DISEASE FOR HEMODIALYSIS ACCESS.  The various methods of treatment have been discussed with the patient and family. After consideration of risks, benefits and other options for treatment, the patient has consented to  Procedure(s): SECOND STAGE BASILIC VEIN TRANSPOSITION RIGHT ARM (Right) as a surgical intervention.  The patient's history has been reviewed, patient examined, no change in status, stable for surgery.  I have reviewed the patient's chart and labs.  Questions were answered to the patient's satisfaction.     Annamarie Major  Patient had fistulogram which indicated that we should go ahead with second stage.  WB

## 2019-03-30 ENCOUNTER — Encounter (HOSPITAL_COMMUNITY): Payer: Self-pay | Admitting: Surgery

## 2019-03-30 ENCOUNTER — Telehealth: Payer: Self-pay

## 2019-03-30 DIAGNOSIS — N186 End stage renal disease: Secondary | ICD-10-CM | POA: Diagnosis not present

## 2019-03-30 DIAGNOSIS — E875 Hyperkalemia: Secondary | ICD-10-CM | POA: Diagnosis not present

## 2019-03-30 DIAGNOSIS — D631 Anemia in chronic kidney disease: Secondary | ICD-10-CM | POA: Diagnosis not present

## 2019-03-30 DIAGNOSIS — D509 Iron deficiency anemia, unspecified: Secondary | ICD-10-CM | POA: Diagnosis not present

## 2019-03-30 DIAGNOSIS — N2581 Secondary hyperparathyroidism of renal origin: Secondary | ICD-10-CM | POA: Diagnosis not present

## 2019-03-30 NOTE — Telephone Encounter (Signed)
Ok to start working on getting him scheduled. thanks

## 2019-03-30 NOTE — Anesthesia Postprocedure Evaluation (Signed)
Anesthesia Post Note  Patient: Maurice Bell  Procedure(s) Performed: SECOND STAGE BASILIC VEIN TRANSPOSITION RIGHT ARM (Right )     Patient location during evaluation: PACU Anesthesia Type: MAC Level of consciousness: awake and alert Pain management: pain level controlled Vital Signs Assessment: post-procedure vital signs reviewed and stable Respiratory status: spontaneous breathing and respiratory function stable Cardiovascular status: stable Postop Assessment: no apparent nausea or vomiting Anesthetic complications: no    Last Vitals:  Vitals:   03/29/19 1057 03/29/19 1133  BP: 134/82 124/81  Pulse: 68 68  Resp: 14 16  Temp: (!) 36.1 C (!) 36.3 C  SpO2: 100% 99%    Last Pain:  Vitals:   03/29/19 1133  TempSrc:   PainSc: 2    Pain Goal:                   [name]

## 2019-03-30 NOTE — Telephone Encounter (Signed)
Patient called said he needed to be scheduled for surgery. He said he was told that his surgery would be the first week of June. Contact for patient. [phone]

## 2019-03-30 NOTE — Telephone Encounter (Signed)
Patient has had AV fistulagram and vein transposition in May. Could you please advise when it is appropriate to schedule?

## 2019-04-01 DIAGNOSIS — D631 Anemia in chronic kidney disease: Secondary | ICD-10-CM | POA: Diagnosis not present

## 2019-04-01 DIAGNOSIS — N186 End stage renal disease: Secondary | ICD-10-CM | POA: Diagnosis not present

## 2019-04-01 DIAGNOSIS — N2581 Secondary hyperparathyroidism of renal origin: Secondary | ICD-10-CM | POA: Diagnosis not present

## 2019-04-01 DIAGNOSIS — E875 Hyperkalemia: Secondary | ICD-10-CM | POA: Diagnosis not present

## 2019-04-01 DIAGNOSIS — D509 Iron deficiency anemia, unspecified: Secondary | ICD-10-CM | POA: Diagnosis not present

## 2019-04-03 DIAGNOSIS — I129 Hypertensive chronic kidney disease with stage 1 through stage 4 chronic kidney disease, or unspecified chronic kidney disease: Secondary | ICD-10-CM | POA: Diagnosis not present

## 2019-04-03 DIAGNOSIS — N186 End stage renal disease: Secondary | ICD-10-CM | POA: Diagnosis not present

## 2019-04-03 DIAGNOSIS — Z992 Dependence on renal dialysis: Secondary | ICD-10-CM | POA: Diagnosis not present

## 2019-04-04 NOTE — Telephone Encounter (Signed)
Patient would like to be scheduled for surgery as soon as possible. I advised that we will be working on getting him scheduled and will give him a call. Patient expressed understanding. Holding for surgery scheduler.

## 2019-04-04 NOTE — Telephone Encounter (Signed)
I called and was disconnected from patient. Will try again.

## 2019-04-05 NOTE — Telephone Encounter (Signed)
Can you advise or hold for Debbie?

## 2019-04-10 NOTE — Telephone Encounter (Signed)
Debbie, please review.

## 2019-04-14 ENCOUNTER — Telehealth: Payer: Self-pay | Admitting: Orthopaedic Surgery

## 2019-04-14 NOTE — Telephone Encounter (Signed)
Patient called stated in severe pain and wanting to know when surgery will be scheduled.  Call patient [phone]

## 2019-04-24 ENCOUNTER — Ambulatory Visit (INDEPENDENT_AMBULATORY_CARE_PROVIDER_SITE_OTHER): Payer: Medicare HMO | Admitting: Physician Assistant

## 2019-04-24 ENCOUNTER — Ambulatory Visit (INDEPENDENT_AMBULATORY_CARE_PROVIDER_SITE_OTHER): Payer: Medicare HMO | Admitting: Surgery

## 2019-04-24 ENCOUNTER — Other Ambulatory Visit: Payer: Self-pay

## 2019-04-24 ENCOUNTER — Encounter: Payer: Self-pay | Admitting: Surgery

## 2019-04-24 VITALS — BP 143/90 | HR 117 | Ht 64.0 in | Wt 140.0 lb

## 2019-04-24 VITALS — BP 160/95 | HR 112 | Temp 97.5°F | Resp 14 | Ht 65.0 in | Wt 133.0 lb

## 2019-04-24 DIAGNOSIS — S134XXS Sprain of ligaments of cervical spine, sequela: Secondary | ICD-10-CM

## 2019-04-24 DIAGNOSIS — M4802 Spinal stenosis, cervical region: Secondary | ICD-10-CM

## 2019-04-24 DIAGNOSIS — N186 End stage renal disease: Secondary | ICD-10-CM

## 2019-04-24 NOTE — Progress Notes (Signed)
63 year old black male with history of C3-C7 stenosis and instability comes in for preop evaluation.  Symptoms unchanged from previous visit.  He is wanting to proceed with C3-C7 ACDF as scheduled July 1.  Per Dr. Narda Amber previous notes after anterior neck is healed he will require C3-C7 posterior fusion with lateral mass screws.  Patient recently had surgery with Dr. Trula Slade May 27 and is scheduled to see him this afternoon.  He had SECOND STAGE BASILIC VEIN TRANSPOSITION RIGHT ARM.  We have received preop clearance from nephrologist Dr. Pearson Grippe.  Patient has end-stage renal disease and is on dialysis.  Surgery procedure discussed in great detail.  All questions answered.

## 2019-04-24 NOTE — Progress Notes (Signed)
    Postoperative Access Visit   History of Present Illness   Maurice Bell is a 63 y.o. year old male who presents for postoperative follow-up for: right second stage basilic vein transposition by Dr. Trula Slade (Date: 03/29/19).  The patient's wounds are well healed.  The patient denies steal symptoms.  The patient is able to complete their activities of daily living.  The patient's current symptoms are: pain at incision site R arm.  He is dialyzing via L IJ TDC on TTS schedule.   Physical Examination   Vitals:   04/24/19 1254  BP: (!) 160/95  Pulse: (!) 112  Resp: 14  Temp: (!) 97.5 F (36.4 C)  TempSrc: Oral  SpO2: 100%  Weight: 133 lb (60.3 kg)  Height: 5\' 5"  (1.651 m)   Body mass index is 22.13 kg/m.  right arm Incisions are healed, hand grip is 5/5, sensation in digits is intact, palpable thrill, bruit can be auscultated, palpable R radial pulse     Medical Decision Making   [name] is a 63 y.o. year old male who presents s/p right second stage basilic vein transposition   Patent transposed basilic vein fistula without signs or symptoms of steal syndrome  The patient's access will be ready for use 05/16/19  The patient's tunneled dialysis catheter can be removed when Nephrology is comfortable with the performance of the fistula  The patient may follow up on a prn basis   Dagoberto Ligas PA-C [facility] Office: [phone]  Clinic MD: Dr. Trula Slade

## 2019-04-26 ENCOUNTER — Encounter (HOSPITAL_COMMUNITY): Payer: Self-pay

## 2019-04-26 ENCOUNTER — Emergency Department (HOSPITAL_COMMUNITY)
Admission: EM | Admit: 2019-04-26 | Discharge: 2019-04-26 | Disposition: A | Discharge status: Home / Self Care | Payer: Medicare HMO | Attending: Emergency Medicine | Admitting: Emergency Medicine

## 2019-04-26 ENCOUNTER — Encounter (HOSPITAL_COMMUNITY)
Admission: RE | Admit: 2019-04-26 | Discharge: 2019-04-26 | Disposition: A | Discharge status: Home / Self Care | Payer: Medicare HMO | Source: Ambulatory Visit | Attending: Orthopaedic Surgery | Admitting: Orthopaedic Surgery

## 2019-04-26 ENCOUNTER — Other Ambulatory Visit: Payer: Self-pay

## 2019-04-26 ENCOUNTER — Encounter (HOSPITAL_COMMUNITY)
Admission: RE | Admit: 2019-04-26 | Discharge: 2019-04-26 | Disposition: A | Discharge status: Home / Self Care | Payer: Medicare HMO | Source: Ambulatory Visit | Attending: Surgery | Admitting: Surgery

## 2019-04-26 DIAGNOSIS — Z5321 Procedure and treatment not carried out due to patient leaving prior to being seen by health care provider: Secondary | ICD-10-CM | POA: Insufficient documentation

## 2019-04-26 DIAGNOSIS — N186 End stage renal disease: Secondary | ICD-10-CM | POA: Insufficient documentation

## 2019-04-26 DIAGNOSIS — Z01818 Encounter for other preprocedural examination: Secondary | ICD-10-CM | POA: Diagnosis not present

## 2019-04-26 DIAGNOSIS — I120 Hypertensive chronic kidney disease with stage 5 chronic kidney disease or end stage renal disease: Secondary | ICD-10-CM | POA: Diagnosis not present

## 2019-04-26 DIAGNOSIS — M4802 Spinal stenosis, cervical region: Secondary | ICD-10-CM | POA: Diagnosis not present

## 2019-04-26 DIAGNOSIS — Z79899 Other long term (current) drug therapy: Secondary | ICD-10-CM | POA: Insufficient documentation

## 2019-04-26 DIAGNOSIS — Z8673 Personal history of transient ischemic attack (TIA), and cerebral infarction without residual deficits: Secondary | ICD-10-CM | POA: Diagnosis not present

## 2019-04-26 DIAGNOSIS — H5711 Ocular pain, right eye: Secondary | ICD-10-CM | POA: Insufficient documentation

## 2019-04-26 DIAGNOSIS — N250 Renal osteodystrophy: Secondary | ICD-10-CM | POA: Insufficient documentation

## 2019-04-26 LAB — COMPREHENSIVE METABOLIC PANEL
ALT: 24 U/L (ref 0–44)
AST: 31 U/L (ref 15–41)
Albumin: 3.5 g/dL (ref 3.5–5.0)
Alkaline Phosphatase: 59 U/L (ref 38–126)
Anion gap: 14 (ref 5–15)
BUN: 46 mg/dL — ABNORMAL HIGH (ref 6–20)
CO2: 26 mmol/L (ref 22–32)
Calcium: 8.9 mg/dL (ref 8.9–10.3)
Chloride: 99 mmol/L (ref 98–111)
Creatinine, Ser: 8.58 mg/dL — ABNORMAL HIGH (ref 0.61–1.24)
GFR calc Af Amer: 7 mL/min — ABNORMAL LOW (ref >60)
GFR calc non Af Amer: 6 mL/min — ABNORMAL LOW (ref >60)
Glucose, Bld: 86 mg/dL (ref 70–99)
Potassium: 5.3 mmol/L — ABNORMAL HIGH (ref 3.5–5.1)
Sodium: 139 mmol/L (ref 135–145)
Total Bilirubin: 1.8 mg/dL — ABNORMAL HIGH (ref 0.3–1.2)
Total Protein: 7.5 g/dL (ref 6.5–8.1)

## 2019-04-26 LAB — CBC
HCT: 33.2 % — ABNORMAL LOW (ref 39.0–52.0)
Hemoglobin: 10.7 g/dL — ABNORMAL LOW (ref 13.0–17.0)
MCH: 35.9 pg — ABNORMAL HIGH (ref 26.0–34.0)
MCHC: 32.2 g/dL (ref 30.0–36.0)
MCV: 111.4 fL — ABNORMAL HIGH (ref 80.0–100.0)
Platelets: 141 10*3/uL — ABNORMAL LOW (ref 150–400)
RBC: 2.98 MIL/uL — ABNORMAL LOW (ref 4.22–5.81)
RDW: 13.9 % (ref 11.5–15.5)
WBC: 4.2 10*3/uL (ref 4.0–10.5)
nRBC: 0 % (ref 0.0–0.2)

## 2019-04-26 LAB — SURGICAL PCR SCREEN
MRSA, PCR: NEGATIVE
Staphylococcus aureus: POSITIVE — AB

## 2019-04-26 NOTE — Progress Notes (Signed)
[id number] [place], [place] [address] [place] [address] Phone: [phone] Fax: [phone]      Your procedure is scheduled on Wednesday, 7/1.  Report to [facility]tr[facility] "A" at 10:30 am A.M., and check in at the Admitting office.  Call this number if you have problems the morning of surgery:  [phone]  Call [phone] if you have any questions prior to your surgery date Monday-Friday 8am-4pm    Remember:  Do not eat or drink after midnight Tuesday.  Please complete your PRE-SURGERY ENSURE that was provided to you 3 hours prior to you surgery start time.  Please, if able, drink it in one setting. DO NOT SIP.  You may drink clear liquids until 9:30 am Wednesday .    Clear liquids allowed are: Water, Non-Citrus Juices (without pulp), Carbonated Beverages, Clear Tea, Black Coffee Only, and Gatorade   Please complete your PRE-SURGERY ENSURE that was provided to you 3 hours prior to you surgery start time.  Please, if able, drink it in one setting. DO NOT SIP.  Take these medicines the morning of surgery with A SIP OF WATER :  acetaminophen (TYLENOL 8 HOUR ARTHRITIS PAIN) if needed cloNIDine (CATAPRES) hydrALAZINE (APRESOLINE) metoprolol tartrate (LOPRESSOR) valACYclovir (VALTREX)   7 days prior to surgery STOP taking any Aspirin (unless otherwise instructed by your surgeon), Aleve, Naproxen, Ibuprofen, Motrin, Advil, Goody's, BC's, all herbal medications, fish oil, and all vitamins.    The Morning of Surgery  Do not wear jewelry.  Do not wear lotions, powders, or colognes, or deodorant  Do not shave 48 hours prior to surgery.  Men may shave face and neck.  Do not bring valuables to the hospital.  [facility] is not responsible for any belongings or valuables.  If you are a smoker, DO NOT Smoke 24 hours prior to surgery.  IF you wear a CPAP at night please bring your mask,  tubing, and machine the morning of surgery   Remember that you must have someone to transport you home after your surgery, and remain with you for 24 hours if you are discharged the same day.   Patients discharged the day of surgery will not be allowed to drive home.    Contacts, glasses, hearing aids, dentures or bridgework may not be worn into surgery.    For patients admitted to the hospital, discharge time will be determined by your treatment team.  Special instructions:   [facility]- Preparing For Surgery  Before surgery, you can play an important role. Because skin is not sterile, your skin needs to be as free of germs as possible. You can reduce the number of germs on your skin by washing with CHG (chlorahexidine gluconate) Soap before surgery.  CHG is an antiseptic cleaner which kills germs and bonds with the skin to continue killing germs even after washing.    Oral Hygiene is also important to reduce your risk of infection.  Remember - BRUSH YOUR TEETH THE MORNING OF SURGERY WITH YOUR REGULAR TOOTHPASTE  Please do not use if you have an allergy to CHG or antibacterial soaps. If your skin becomes reddened/irritated stop using the CHG.  Do not shave (including legs and underarms) for at least 48 hours prior to first CHG shower. It is OK to shave your face.  Please follow these instructions carefully.   1. Shower the [employer] BEFORE SURGERY (Tues) and the MORNING OF SURGERY(Wed)  with CHG Soap.   2. If you chose to wash your hair, wash your hair first as usual with your normal shampoo.  3. After you shampoo, rinse your hair and body thoroughly to remove the shampoo.  4. Use CHG as you would any other liquid soap. You can apply CHG directly to the skin and wash gently with a scrungie or a clean washcloth.   5. Apply the CHG Soap to your body ONLY FROM THE NECK DOWN.  Do not use on open wounds or open sores. Avoid contact with your eyes, ears, mouth and genitals (private parts). Wash  Face and genitals (private parts)  with your normal soap.   6. Wash thoroughly, paying special attention to the area where your surgery will be performed.  7. Thoroughly rinse your body with warm water from the neck down.  8. DO NOT shower/wash with your normal soap after using and rinsing off the CHG Soap.  9. Pat yourself dry with a CLEAN TOWEL.  10. Wear CLEAN PAJAMAS to bed the night before surgery, wear comfortable clothes the morning of surgery  11. Place CLEAN SHEETS on your bed the night of your first shower and DO NOT SLEEP WITH PETS.    Day of Surgery:  Do not apply any deodorants/lotions.  Please wear clean clothes to the hospital/surgery center.   Remember to brush your teeth WITH YOUR REGULAR TOOTHPASTE.   Please read over the following fact sheets that you were given.

## 2019-04-26 NOTE — Progress Notes (Signed)
Patient informed of the [place] that is currently in effect.  Patient verbalized understanding.  Patient denies shortness of breath, fever, cough and chest pain at PAT appointment  PCP - Dr Patsey Berthold Cardiologist - Dr Deitra Mayo Nephrologist - Dr Pearson Grippe  Chest x-ray - 04/26/19 EKG - 03/24/19 Stress Test - Denies ECHO - Denies Cardiac Cath - Denies    ERAS: Clears til 9:30 am on DOS.  One Ensure Pre-Surgery drink given at PAT appt.   Anesthesia review: Yes - Cardiac Ebony Hail, PA.  Patient verbalized understanding of instructions that were given to them at the PAT appointment. Patient was also instructed that they will need to review over the PAT instructions again at home before surgery.   Coronavirus Screening Have you or your son experienced the following symptoms:  Cough yes/no: No Fever (>100.67F)  yes/no: No Runny nose yes/no: No Sore throat yes/no: No Difficulty breathing/shortness of breath  yes/no: No  Have you or a family member traveled in the last 14 days and where? yes/no: No

## 2019-04-26 NOTE — Progress Notes (Signed)
Patient PCR Screen was positive for Staph.  [facility] [address], spoke with [place] .  Called to inform patient to treat twice daily, nasally for five days, however, patient's voicemail not set up yet.  Unable to reach patient.  Treat on DOS with profend.

## 2019-04-26 NOTE — ED Triage Notes (Signed)
Pt here with c/o right eye irritation that started yesterday, states that he feels like there is something in it

## 2019-04-27 ENCOUNTER — Other Ambulatory Visit: Payer: Self-pay

## 2019-04-27 ENCOUNTER — Other Ambulatory Visit (HOSPITAL_COMMUNITY): Payer: Medicare HMO
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
## 2019-04-29 ENCOUNTER — Other Ambulatory Visit (HOSPITAL_COMMUNITY)
Admission: RE | Admit: 2019-04-29 | Discharge: 2019-04-29 | Disposition: A | Discharge status: Home / Self Care | Payer: Medicare HMO | Source: Ambulatory Visit | Attending: Orthopaedic Surgery | Admitting: Orthopaedic Surgery

## 2019-04-29 DIAGNOSIS — Z1159 Encounter for screening for other viral diseases: Secondary | ICD-10-CM | POA: Insufficient documentation

## 2019-04-29 LAB — SARS CORONAVIRUS 2 (TAT 6-24 HRS): SARS Coronavirus 2: NEGATIVE

## 2019-05-03 ENCOUNTER — Other Ambulatory Visit: Payer: Self-pay

## 2019-05-03 ENCOUNTER — Encounter (HOSPITAL_COMMUNITY): Payer: Self-pay

## 2019-05-03 ENCOUNTER — Ambulatory Visit (HOSPITAL_COMMUNITY): Payer: Medicare HMO | Admitting: Vascular Surgery

## 2019-05-03 ENCOUNTER — Observation Stay (HOSPITAL_COMMUNITY)
Admission: RE | Admit: 2019-05-03 | Discharge: 2019-05-04 | Disposition: A | Discharge status: Home / Self Care | Payer: Medicare HMO | Attending: Orthopaedic Surgery | Admitting: Orthopaedic Surgery

## 2019-05-03 ENCOUNTER — Encounter (HOSPITAL_COMMUNITY)
Admission: RE | Disposition: A | Discharge status: Home / Self Care | Payer: Self-pay | Source: Home / Self Care | Attending: Orthopaedic Surgery

## 2019-05-03 ENCOUNTER — Ambulatory Visit (HOSPITAL_COMMUNITY): Payer: Medicare HMO

## 2019-05-03 ENCOUNTER — Ambulatory Visit (HOSPITAL_COMMUNITY): Payer: Medicare HMO | Admitting: Anesthesiology

## 2019-05-03 DIAGNOSIS — M4802 Spinal stenosis, cervical region: Principal | ICD-10-CM | POA: Insufficient documentation

## 2019-05-03 DIAGNOSIS — Z992 Dependence on renal dialysis: Secondary | ICD-10-CM | POA: Diagnosis not present

## 2019-05-03 DIAGNOSIS — Z8673 Personal history of transient ischemic attack (TIA), and cerebral infarction without residual deficits: Secondary | ICD-10-CM | POA: Diagnosis not present

## 2019-05-03 DIAGNOSIS — N250 Renal osteodystrophy: Secondary | ICD-10-CM | POA: Diagnosis not present

## 2019-05-03 DIAGNOSIS — Z79899 Other long term (current) drug therapy: Secondary | ICD-10-CM | POA: Diagnosis not present

## 2019-05-03 DIAGNOSIS — N186 End stage renal disease: Secondary | ICD-10-CM | POA: Insufficient documentation

## 2019-05-03 DIAGNOSIS — I120 Hypertensive chronic kidney disease with stage 5 chronic kidney disease or end stage renal disease: Secondary | ICD-10-CM | POA: Diagnosis not present

## 2019-05-03 DIAGNOSIS — M479 Spondylosis, unspecified: Secondary | ICD-10-CM | POA: Diagnosis not present

## 2019-05-03 DIAGNOSIS — M40202 Unspecified kyphosis, cervical region: Secondary | ICD-10-CM | POA: Insufficient documentation

## 2019-05-03 DIAGNOSIS — B1920 Unspecified viral hepatitis C without hepatic coma: Secondary | ICD-10-CM | POA: Diagnosis not present

## 2019-05-03 DIAGNOSIS — M1990 Unspecified osteoarthritis, unspecified site: Secondary | ICD-10-CM | POA: Insufficient documentation

## 2019-05-03 DIAGNOSIS — Z419 Encounter for procedure for purposes other than remedying health state, unspecified: Secondary | ICD-10-CM

## 2019-05-03 HISTORY — PX: ANTERIOR CERVICAL DECOMPRESSION/DISCECTOMY FUSION 4 LEVELS: SHX5556

## 2019-05-03 LAB — POCT I-STAT 4, (NA,K, GLUC, HGB,HCT)
Glucose, Bld: 88 mg/dL (ref 70–99)
HCT: 31 % — ABNORMAL LOW (ref 39.0–52.0)
Hemoglobin: 10.5 g/dL — ABNORMAL LOW (ref 13.0–17.0)
Potassium: 4.2 mmol/L (ref 3.5–5.1)
Sodium: 133 mmol/L — ABNORMAL LOW (ref 135–145)

## 2019-05-03 SURGERY — ANTERIOR CERVICAL DECOMPRESSION/DISCECTOMY FUSION 4 LEVELS
Anesthesia: General | Site: Spine Cervical

## 2019-05-03 MED ORDER — HYDROMORPHONE HCL 1 MG/ML IJ SOLN: 0.2500 mg | INTRAMUSCULAR | Status: DC | PRN

## 2019-05-03 MED ORDER — DOCUSATE SODIUM 100 MG PO CAPS
100.0000 mg | ORAL_CAPSULE | Freq: Two times a day (BID) | ORAL | Status: DC
  Administered 2019-05-03: 22:00:00 100 mg via ORAL
  Filled 2019-05-03: qty 1

## 2019-05-03 MED ORDER — VALACYCLOVIR HCL 500 MG PO TABS
500.0000 mg | ORAL_TABLET | Freq: Every day | ORAL | Status: DC
  Filled 2019-05-03: qty 1

## 2019-05-03 MED ORDER — DEXAMETHASONE SODIUM PHOSPHATE 10 MG/ML IJ SOLN
INTRAMUSCULAR | Status: DC | PRN
  Administered 2019-05-03: 10 mg via INTRAVENOUS

## 2019-05-03 MED ORDER — BUPIVACAINE-EPINEPHRINE (PF) 0.5% -1:200000 IJ SOLN
INTRAMUSCULAR | Status: AC
  Filled 2019-05-03: qty 30

## 2019-05-03 MED ORDER — ONDANSETRON HCL 4 MG PO TABS: 4.0000 mg | ORAL_TABLET | Freq: Four times a day (QID) | ORAL | Status: DC | PRN

## 2019-05-03 MED ORDER — ROCURONIUM BROMIDE 100 MG/10ML IV SOLN
INTRAVENOUS | Status: DC | PRN
  Administered 2019-05-03: 10 mg via INTRAVENOUS
  Administered 2019-05-03: 20 mg via INTRAVENOUS
  Administered 2019-05-03: 40 mg via INTRAVENOUS
  Administered 2019-05-03: 10 mg via INTRAVENOUS

## 2019-05-03 MED ORDER — HEMOSTATIC AGENTS (NO CHARGE) OPTIME
TOPICAL | Status: DC | PRN
  Administered 2019-05-03: 1

## 2019-05-03 MED ORDER — SODIUM ZIRCONIUM CYCLOSILICATE 10 G PO PACK: 10.0000 g | PACK | ORAL | Status: DC

## 2019-05-03 MED ORDER — SODIUM CHLORIDE 0.9 % IV SOLN
INTRAVENOUS | Status: DC
  Administered 2019-05-03: 11:00:00 via INTRAVENOUS

## 2019-05-03 MED ORDER — CEFAZOLIN SODIUM-DEXTROSE 2-4 GM/100ML-% IV SOLN
2.0000 g | INTRAVENOUS | Status: AC
  Administered 2019-05-03: 13:00:00 2 g via INTRAVENOUS

## 2019-05-03 MED ORDER — ONDANSETRON HCL 4 MG/2ML IJ SOLN
INTRAMUSCULAR | Status: DC | PRN
  Administered 2019-05-03: 4 mg via INTRAVENOUS

## 2019-05-03 MED ORDER — OXYCODONE HCL 5 MG PO TABS
5.0000 mg | ORAL_TABLET | ORAL | Status: DC | PRN
  Administered 2019-05-04 (×2): 5 mg via ORAL
  Filled 2019-05-03 (×2): qty 1

## 2019-05-03 MED ORDER — SUCCINYLCHOLINE CHLORIDE 200 MG/10ML IV SOSY
PREFILLED_SYRINGE | INTRAVENOUS | Status: AC
  Filled 2019-05-03: qty 10

## 2019-05-03 MED ORDER — CEFAZOLIN SODIUM-DEXTROSE 1-4 GM/50ML-% IV SOLN
1.0000 g | Freq: Three times a day (TID) | INTRAVENOUS | Status: AC
  Administered 2019-05-03 – 2019-05-04 (×2): 1 g via INTRAVENOUS
  Filled 2019-05-03 (×2): qty 50

## 2019-05-03 MED ORDER — 0.9 % SODIUM CHLORIDE (POUR BTL) OPTIME
TOPICAL | Status: DC | PRN
  Administered 2019-05-03 (×2): 1000 mL

## 2019-05-03 MED ORDER — ONDANSETRON HCL 4 MG/2ML IJ SOLN
INTRAMUSCULAR | Status: AC
  Filled 2019-05-03: qty 2

## 2019-05-03 MED ORDER — OXYCODONE HCL 5 MG/5ML PO SOLN: 5.0000 mg | Freq: Once | ORAL | Status: AC | PRN

## 2019-05-03 MED ORDER — LIDOCAINE 2% (20 MG/ML) 5 ML SYRINGE
INTRAMUSCULAR | Status: AC
  Filled 2019-05-03: qty 5

## 2019-05-03 MED ORDER — FENTANYL CITRATE (PF) 250 MCG/5ML IJ SOLN
INTRAMUSCULAR | Status: AC
  Filled 2019-05-03: qty 5

## 2019-05-03 MED ORDER — SODIUM CHLORIDE 0.9 % IV SOLN: INTRAVENOUS | Status: DC

## 2019-05-03 MED ORDER — PROPOFOL 10 MG/ML IV BOLUS
INTRAVENOUS | Status: DC | PRN
  Administered 2019-05-03: 120 mg via INTRAVENOUS

## 2019-05-03 MED ORDER — HYDRALAZINE HCL 10 MG PO TABS
50.0000 mg | ORAL_TABLET | Freq: Two times a day (BID) | ORAL | Status: DC
  Administered 2019-05-03: 21:00:00 50 mg via ORAL
  Filled 2019-05-03: qty 5

## 2019-05-03 MED ORDER — SUGAMMADEX SODIUM 200 MG/2ML IV SOLN
INTRAVENOUS | Status: DC | PRN
  Administered 2019-05-03: 200 mg via INTRAVENOUS

## 2019-05-03 MED ORDER — MEPERIDINE HCL 25 MG/ML IJ SOLN: 6.2500 mg | INTRAMUSCULAR | Status: DC | PRN

## 2019-05-03 MED ORDER — SODIUM CHLORIDE 0.9 % IV SOLN
INTRAVENOUS | Status: DC | PRN
  Administered 2019-05-03: 40 ug/min via INTRAVENOUS

## 2019-05-03 MED ORDER — OXYCODONE HCL 5 MG PO TABS
5.0000 mg | ORAL_TABLET | Freq: Once | ORAL | Status: AC | PRN
  Administered 2019-05-03: 18:00:00 5 mg via ORAL

## 2019-05-03 MED ORDER — DEXAMETHASONE SODIUM PHOSPHATE 10 MG/ML IJ SOLN
INTRAMUSCULAR | Status: AC
  Filled 2019-05-03: qty 1

## 2019-05-03 MED ORDER — LIDOCAINE HCL (CARDIAC) PF 100 MG/5ML IV SOSY
PREFILLED_SYRINGE | INTRAVENOUS | Status: DC | PRN
  Administered 2019-05-03: 100 mg via INTRAVENOUS

## 2019-05-03 MED ORDER — CLONIDINE HCL 0.1 MG PO TABS
0.1000 mg | ORAL_TABLET | Freq: Three times a day (TID) | ORAL | Status: DC
  Administered 2019-05-03: 22:00:00 0.1 mg via ORAL
  Filled 2019-05-03: qty 1

## 2019-05-03 MED ORDER — MENTHOL 3 MG MT LOZG: 1.0000 | LOZENGE | OROMUCOSAL | Status: DC | PRN

## 2019-05-03 MED ORDER — PHENOL 1.4 % MT LIQD: 1.0000 | OROMUCOSAL | Status: DC | PRN

## 2019-05-03 MED ORDER — SODIUM CHLORIDE 0.9% FLUSH: 3.0000 mL | INTRAVENOUS | Status: DC | PRN

## 2019-05-03 MED ORDER — ONDANSETRON HCL 4 MG/2ML IJ SOLN: 4.0000 mg | Freq: Four times a day (QID) | INTRAMUSCULAR | Status: DC | PRN

## 2019-05-03 MED ORDER — SEVELAMER CARBONATE 800 MG PO TABS
3200.0000 mg | ORAL_TABLET | Freq: Three times a day (TID) | ORAL | Status: DC
  Filled 2019-05-03 (×2): qty 4

## 2019-05-03 MED ORDER — METOPROLOL TARTRATE 25 MG PO TABS
150.0000 mg | ORAL_TABLET | Freq: Two times a day (BID) | ORAL | Status: DC
  Administered 2019-05-03: 22:00:00 150 mg via ORAL
  Filled 2019-05-03: qty 6

## 2019-05-03 MED ORDER — PHENYLEPHRINE 40 MCG/ML (10ML) SYRINGE FOR IV PUSH (FOR BLOOD PRESSURE SUPPORT)
PREFILLED_SYRINGE | INTRAVENOUS | Status: AC
  Filled 2019-05-03: qty 10

## 2019-05-03 MED ORDER — SUCCINYLCHOLINE CHLORIDE 20 MG/ML IJ SOLN
INTRAMUSCULAR | Status: DC | PRN
  Administered 2019-05-03: 100 mg via INTRAVENOUS

## 2019-05-03 MED ORDER — HYDROMORPHONE HCL 1 MG/ML IJ SOLN: 0.5000 mg | INTRAMUSCULAR | Status: DC | PRN

## 2019-05-03 MED ORDER — OXYCODONE HCL 5 MG PO TABS
ORAL_TABLET | ORAL | Status: AC
  Filled 2019-05-03: qty 1

## 2019-05-03 MED ORDER — CHLORHEXIDINE GLUCONATE 4 % EX LIQD: 60.0000 mL | Freq: Once | CUTANEOUS | Status: DC

## 2019-05-03 MED ORDER — SODIUM CHLORIDE 0.9% FLUSH: 3.0000 mL | Freq: Two times a day (BID) | INTRAVENOUS | Status: DC

## 2019-05-03 MED ORDER — BUPIVACAINE-EPINEPHRINE 0.5% -1:200000 IJ SOLN
INTRAMUSCULAR | Status: DC | PRN
  Administered 2019-05-03: 8 mL

## 2019-05-03 MED ORDER — PROMETHAZINE HCL 25 MG/ML IJ SOLN: 6.2500 mg | INTRAMUSCULAR | Status: DC | PRN

## 2019-05-03 MED ORDER — AMLODIPINE BESYLATE 5 MG PO TABS: 10.0000 mg | ORAL_TABLET | Freq: Every day | ORAL | Status: DC

## 2019-05-03 MED ORDER — MIDAZOLAM HCL 2 MG/2ML IJ SOLN
INTRAMUSCULAR | Status: AC
  Filled 2019-05-03: qty 2

## 2019-05-03 MED ORDER — PROPOFOL 10 MG/ML IV BOLUS
INTRAVENOUS | Status: AC
  Filled 2019-05-03: qty 20

## 2019-05-03 MED ORDER — POLYETHYLENE GLYCOL 3350 17 G PO PACK
17.0000 g | PACK | Freq: Every day | ORAL | Status: DC
  Filled 2019-05-03: qty 1

## 2019-05-03 MED ORDER — ALBUMIN HUMAN 5 % IV SOLN
INTRAVENOUS | Status: DC | PRN
  Administered 2019-05-03 (×2): via INTRAVENOUS

## 2019-05-03 MED ORDER — FENTANYL CITRATE (PF) 100 MCG/2ML IJ SOLN
INTRAMUSCULAR | Status: DC | PRN
  Administered 2019-05-03: 100 ug via INTRAVENOUS
  Administered 2019-05-03: 50 ug via INTRAVENOUS
  Administered 2019-05-03: 100 ug via INTRAVENOUS
  Administered 2019-05-03: 50 ug via INTRAVENOUS

## 2019-05-03 MED ORDER — RENA-VITE PO TABS
1.0000 | ORAL_TABLET | Freq: Every day | ORAL | Status: DC
  Administered 2019-05-03: 22:00:00 1 via ORAL
  Filled 2019-05-03: qty 1

## 2019-05-03 MED ORDER — ROCURONIUM BROMIDE 10 MG/ML (PF) SYRINGE
PREFILLED_SYRINGE | INTRAVENOUS | Status: AC
  Filled 2019-05-03: qty 10

## 2019-05-03 SURGICAL SUPPLY — 65 items
AGENT HMST KT MTR STRL THRMB (HEMOSTASIS) ×1
APL SKNCLS STERI-STRIP NONHPOA (GAUZE/BANDAGES/DRESSINGS) ×1
BENZOIN TINCTURE PRP APPL 2/3 (GAUZE/BANDAGES/DRESSINGS) ×3 IMPLANT
BIT DRILL SRG 14X2.2XFLT CHK (BIT) IMPLANT
BIT DRL SRG 14X2.2XFLT CHK (BIT) ×1
BONE CERV LORDOTIC 14.5X12X6 (Bone Implant) ×3 IMPLANT
BONE CERV LORDOTIC 14.5X12X7 (Bone Implant) ×9 IMPLANT
BONE VIVIGEN FORMABLE 1.3CC (Bone Implant) ×6 IMPLANT
BUR ROUND FLUTED 4 SOFT TCH (BURR) ×1 IMPLANT
BUR ROUND FLUTED 4MM SOFT TCH (BURR) ×1
CLOSURE WOUND 1/2 X4 (GAUZE/BANDAGES/DRESSINGS) ×1
COLLAR CERV LO CONTOUR FIRM DE (SOFTGOODS) ×3 IMPLANT
COVER SURGICAL LIGHT HANDLE (MISCELLANEOUS) ×3 IMPLANT
COVER WAND RF STERILE (DRAPES) ×3 IMPLANT
DRAPE C-ARM 42X72 X-RAY (DRAPES) ×3 IMPLANT
DRAPE C-ARMOR (DRAPES) ×2 IMPLANT
DRAPE HALF SHEET 40X57 (DRAPES) ×3 IMPLANT
DRAPE MICROSCOPE LEICA (MISCELLANEOUS) ×3 IMPLANT
DRILL BIT SKYLINE 14MM (BIT) ×3
DURAPREP 6ML APPLICATOR 50/CS (WOUND CARE) ×3 IMPLANT
ELECT COATED BLADE 2.86 ST (ELECTRODE) ×3 IMPLANT
ELECT REM PT RETURN 9FT ADLT (ELECTROSURGICAL) ×3
ELECTRODE REM PT RTRN 9FT ADLT (ELECTROSURGICAL) ×1 IMPLANT
EVACUATOR 1/8 PVC DRAIN (DRAIN) ×3 IMPLANT
GAUZE SPONGE 4X4 12PLY STRL (GAUZE/BANDAGES/DRESSINGS) ×3 IMPLANT
GAUZE XEROFORM 1X8 LF (GAUZE/BANDAGES/DRESSINGS) ×3 IMPLANT
GLOVE BIOGEL PI IND STRL 8 (GLOVE) ×2 IMPLANT
GLOVE BIOGEL PI INDICATOR 8 (GLOVE) ×4
GLOVE ORTHO TXT STRL SZ7.5 (GLOVE) ×6 IMPLANT
GLOVE SURG SS PI 7.0 STRL IVOR (GLOVE) ×2 IMPLANT
GOWN STRL REUS W/ TWL LRG LVL3 (GOWN DISPOSABLE) ×1 IMPLANT
GOWN STRL REUS W/ TWL XL LVL3 (GOWN DISPOSABLE) ×1 IMPLANT
GOWN STRL REUS W/TWL 2XL LVL3 (GOWN DISPOSABLE) ×3 IMPLANT
GOWN STRL REUS W/TWL LRG LVL3 (GOWN DISPOSABLE) ×3
GOWN STRL REUS W/TWL XL LVL3 (GOWN DISPOSABLE) ×3
GRAFT BNE MATRIX VG FRMBL SM 1 (Bone Implant) IMPLANT
GRAFT BNE SPCR VG2 14.5X12X6 (Bone Implant) IMPLANT
GRAFT BNE SPCR VG2 14.5X12X7 (Bone Implant) IMPLANT
HEAD HALTER (SOFTGOODS) ×3 IMPLANT
KIT BASIN OR (CUSTOM PROCEDURE TRAY) ×3 IMPLANT
KIT TURNOVER KIT B (KITS) ×3 IMPLANT
MANIFOLD NEPTUNE II (INSTRUMENTS) ×3 IMPLANT
NDL 25GX 5/8IN NON SAFETY (NEEDLE) ×1 IMPLANT
NEEDLE 25GX 5/8IN NON SAFETY (NEEDLE) ×3 IMPLANT
NS IRRIG 1000ML POUR BTL (IV SOLUTION) ×3 IMPLANT
PACK ORTHO CERVICAL (CUSTOM PROCEDURE TRAY) ×3 IMPLANT
PAD ARMBOARD 7.5X6 YLW CONV (MISCELLANEOUS) ×6 IMPLANT
PATTIES SURGICAL .5 X.5 (GAUZE/BANDAGES/DRESSINGS) ×2 IMPLANT
PIN TEMP SKYLINE THREADED (PIN) ×2 IMPLANT
PLATE SKYLINE 60MM 4LVL (Plate) ×2 IMPLANT
POSITIONER HEAD DONUT 9IN (MISCELLANEOUS) ×3 IMPLANT
SCREW VAR SELF TAP SKYLINE 14M (Screw) ×16 IMPLANT
SPONGE INTESTINAL PEANUT (DISPOSABLE) ×2 IMPLANT
STRIP CLOSURE SKIN 1/2X4 (GAUZE/BANDAGES/DRESSINGS) ×2 IMPLANT
SURGIFLO W/THROMBIN 8M KIT (HEMOSTASIS) ×2 IMPLANT
SUT BONE WAX W31G (SUTURE) ×7 IMPLANT
SUT VIC AB 3-0 X1 27 (SUTURE) ×3 IMPLANT
SUT VICRYL 4-0 PS2 18IN ABS (SUTURE) ×6 IMPLANT
SYR 30ML SLIP (SYRINGE) ×3 IMPLANT
SYR BULB 3OZ (MISCELLANEOUS) ×3 IMPLANT
TAPE CLOTH SURG 4X10 WHT LF (GAUZE/BANDAGES/DRESSINGS) ×2 IMPLANT
TOWEL GREEN STERILE (TOWEL DISPOSABLE) ×3 IMPLANT
TOWEL GREEN STERILE FF (TOWEL DISPOSABLE) ×3 IMPLANT
TRAY FOLEY MTR SLVR 16FR STAT (SET/KITS/TRAYS/PACK) ×2 IMPLANT
WATER STERILE IRR 1000ML POUR (IV SOLUTION) ×3 IMPLANT
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
## 2019-05-03 NOTE — Interval H&P Note (Signed)
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  The various methods of treatment have been discussed with the patient and family. After consideration of risks, benefits and other options for treatment, the patient has consented to  Procedure(s): C3-4, C4-5, C5-6, C6-7 ANTERIOR CERVICAL DECOMPRESSION/DISCECTOMY FUSION, ALLOGRAFTS & PLATE (N/A) as a surgical intervention.  The patient's history has been reviewed, patient examined, no change in status, stable for surgery.  I have reviewed the patient's chart and labs.  Questions were answered to the patient's satisfaction.     Marybelle Killings

## 2019-05-03 NOTE — Op Note (Signed)
Preoperative diagnosis: Cervical stenosis, multilevel with dialysis related spondyloarthropathy, brown tumor of bone.  Progressive collapse and kyphosis.  Postop diagnosis: Same  Procedure: C3-4, C4-5, C5-6, C6-7 anterior cervical discectomy and fusion, allograft and plate.(4 level fusion)  Surgeon: Rodell Perna, MD  Assistant: Benjiman Core, PA-C medically necessary and present for the entire procedure.  Anesthesia General oral tracheal  EBL 700 cc.  Implants: Synthes skyline 60 mm plate.  VG 2 cortical cancellus allograft 7 mm x 3 6 mm x 1.  14 mm skyline screws in all levels except no screws in C3.  Procedure after induction general anesthesia orotracheal ovation patient was placed supine arms tucked the side Foley catheter was placed but was removed at the end of the case patient on nondialysis and unsure how much urine to be present in the bladder.  Had ultra traction of been applied without weight neck was prepped with DuraPrep the area squared with towels timeout procedure was completed Betadine Steri-Drape and thyroid sheets and draped a sterile Mayo stand at the head.  Patient had significant spondyloarthropathy with multilevel subluxation and dialysis related cystic changes in the bone with progressive kyphosis and stenosis.  He had severe stenosis at C5-6 with varying levels of foraminal stenosis at the remaining levels.  4 level cervical fusion was selected in order to restore some cervical lordosis rather than not corpectomies.  After timeout procedure incision was made longitudinally adjacent to the sternocleidomastoid.  Blunt dissection down the longus Coley was performed.  Overlying the longus Lenon Curt was extensive hypervascular tissue consistent with MRI and CT scan.  This was vascular continued to ooze despite use of bone wax Surgi-Flo, pads, bipolar cautery and regular cautery.  The C3-4 and the C5-6 levels were not not able to be visualized and using a 25 short needle the bone was  soft enough where the needle could poke into bone it was difficult terms where the disc was present.  C4-5 level was the easiest 1 to be visualized on sterilely draped C arm.  C4-5 level was done first and large 1 cm osteophytes were removed anteriorly.  Progressing back to posterior cortex posterior longitudinal ligament was taken down decompressed and a 7 mm graft was placed.  Next the C5-6 level surgically addressed and multiple shocks from spot fluoroscopy had to be addressed and spurs at a move anteriorly.  Patient had motion at C5-6 but due to the erosive changes the disc space had a serpentine path.  Progressed down the posterior cortex with a large amount of extruded disc that had some brownish color to it.  Complete decompression of the dura was performed and submillimeter graft was selected.  Identical procedure repeated at the C6-7 level and this also was a 7 mm graft.  At all levels spurs had been moved with the bone bur and bone wax applied in order to allow the plate to sit flat.  At C3-4 there was the most severe erosive changes with kyphosis and the inferior half of C3 was missing.  Disc space was identified and rongeured but there was enough bone above it left after using the bur and the Cloward curettes to plate screws.  60 mm plate was selected so that the plate set over the front of the graft graft was impacted down flush with the anterior cortex was still somewhat loose but the plate set over it securely and some vivid gin was packed around it.  60 mm plate was selected checked under C arm adjusted few  times it was in correct position and then screws were filled checked under fluoroscopy final spot pictures taken and tiny screwdriver was used to lock the screws in at C4-C7.  Patient had extensive oozing throughout the case multiple times we stopped and spent time just for hemostasis.  At the end the case operative area was no longer bleeding and Hemovac was placed with a knot technique and  left-sided incision platysma closed with 3-0 Vicryl 4-0 Vicryl septic or closure tincture benzoin Steri-Strips Marcaine infiltration 6 cc and postop dressing with 4 x 4's tape and soft collar.  Instrument count needle count was correct.

## 2019-05-03 NOTE — Transfer of Care (Signed)
Immediate Anesthesia Transfer of Care Note  Patient: Maurice Bell  Procedure(s) Performed: C3-4, C4-5, C5-6, C6-7 ANTERIOR CERVICAL DECOMPRESSION/DISCECTOMY FUSION, ALLOGRAFTS & PLATE (N/A Spine Cervical)  Patient Location: PACU  Anesthesia Type:General  Level of Consciousness: drowsy  Airway & Oxygen Therapy: Patient Spontanous Breathing and Patient connected to nasal cannula oxygen  Post-op Assessment: Report given to RN, Post -op Vital signs reviewed and stable and Patient moving all extremities  Post vital signs: Reviewed and stable  Last Vitals:  Vitals Value Taken Time  BP    Temp    Pulse 85 05/03/19 1715  Resp 16 05/03/19 1715  SpO2 100 % 05/03/19 1715  Vitals shown include unvalidated device data.  Last Pain:  Vitals:   05/03/19 1039  TempSrc: Oral  PainSc: 8       Patients Stated Pain Goal: 3 (22/84/06 9861)  Complications: No apparent anesthesia complications

## 2019-05-03 NOTE — Anesthesia Preprocedure Evaluation (Signed)
Anesthesia Evaluation  Patient identified by MRN, date of birth, ID band Patient awake    Reviewed: Allergy & Precautions, NPO status , Patient's Chart, lab work & pertinent test results, reviewed documented beta blocker date and time   History of Anesthesia Complications Negative for: history of anesthetic complications  Airway Mallampati: II  TM Distance: >3 FB Neck ROM: Full    Dental no notable dental hx. (+) Missing, Dental Advisory Given   Pulmonary shortness of breath, pneumonia, former smoker,    Pulmonary exam normal        Cardiovascular hypertension, Pt. on medications and Pt. on home beta blockers Normal cardiovascular exam     Neuro/Psych PSYCHIATRIC DISORDERS Anxiety Depression CVA    GI/Hepatic GERD  ,(+) Hepatitis -, C  Endo/Other  negative endocrine ROS  Renal/GU ESRF and DialysisRenal disease     Musculoskeletal  (+) Arthritis ,   Abdominal   Peds  Hematology  (+) anemia ,   Anesthesia Other Findings   Reproductive/Obstetrics                             Anesthesia Physical  Anesthesia Plan  ASA: III  Anesthesia Plan: General   Post-op Pain Management:    Induction: Intravenous  PONV Risk Score and Plan: 3 and Ondansetron, Treatment may vary due to age or medical condition, Midazolam and Dexamethasone  Airway Management Planned: Oral ETT  Additional Equipment: None  Intra-op Plan:   Post-operative Plan: Extubation in OR  Informed Consent: I have reviewed the patients History and Physical, chart, labs and discussed the procedure including the risks, benefits and alternatives for the proposed anesthesia with the patient or authorized representative who has indicated his/her understanding and acceptance.     Dental advisory given  Plan Discussed with: CRNA, Anesthesiologist and Surgeon  Anesthesia Plan Comments:        Anesthesia Quick  Evaluation

## 2019-05-03 NOTE — Anesthesia Procedure Notes (Signed)
Procedure Name: Intubation Date/Time: 05/03/2019 1:05 PM Performed by: Alaynah Schutter T, CRNA Pre-anesthesia Checklist: Patient identified, Emergency Drugs available, Suction available and Patient being monitored Patient Re-evaluated:Patient Re-evaluated prior to induction Oxygen Delivery Method: Circle system utilized Preoxygenation: Pre-oxygenation with 100% oxygen Induction Type: IV induction and Rapid sequence Laryngoscope Size: Glidescope and 4 Tube type: Oral Tube size: 7.5 mm Number of attempts: 1 Airway Equipment and Method: Patient positioned with wedge pillow,  Stylet and Video-laryngoscopy Placement Confirmation: ETT inserted through vocal cords under direct vision,  positive ETCO2 and breath sounds checked- equal and bilateral Secured at: 21 cm Tube secured with: Tape Dental Injury: Teeth and Oropharynx as per pre-operative assessment

## 2019-05-04 ENCOUNTER — Encounter (HOSPITAL_COMMUNITY): Payer: Self-pay | Admitting: Orthopaedic Surgery

## 2019-05-04 DIAGNOSIS — M4802 Spinal stenosis, cervical region: Secondary | ICD-10-CM | POA: Diagnosis not present

## 2019-05-04 MED ORDER — OXYCODONE-ACETAMINOPHEN 5-325 MG PO TABS: 1.0000 | ORAL_TABLET | Freq: Four times a day (QID) | ORAL | 0 refills | Status: DC | PRN

## 2019-05-04 MED ORDER — OXYCODONE-ACETAMINOPHEN 5-325 MG PO TABS: 1.0000 | ORAL_TABLET | ORAL | 0 refills | Status: DC | PRN

## 2019-05-04 NOTE — Progress Notes (Signed)
Patient is discharged from room 3C10 at this time. Alert and in stable condition. IV site d/c'd and instructions read to patient with understanding verbalized. Left unit via wheelchair with all belongings at side. 

## 2019-05-04 NOTE — Anesthesia Postprocedure Evaluation (Signed)
Anesthesia Post Note  Patient: Maurice Bell  Procedure(s) Performed: C3-4, C4-5, C5-6, C6-7 ANTERIOR CERVICAL DECOMPRESSION/DISCECTOMY FUSION, ALLOGRAFTS & PLATE (N/A Spine Cervical)     Patient location during evaluation: PACU Anesthesia Type: General Level of consciousness: awake and alert Pain management: pain level controlled Vital Signs Assessment: post-procedure vital signs reviewed and stable Respiratory status: spontaneous breathing, nonlabored ventilation and respiratory function stable Cardiovascular status: blood pressure returned to baseline and stable Postop Assessment: no apparent nausea or vomiting Anesthetic complications: no    Last Vitals:  Vitals:   05/04/19 0449 05/04/19 0726  BP: 131/83 (!) 153/89  Pulse: 73 81  Resp: 18 16  Temp: 36.9 C 36.6 C  SpO2: 100% 100%    Last Pain:  Vitals:   05/04/19 0726  TempSrc: Oral  PainSc:                  Audry Pili

## 2019-05-04 NOTE — Progress Notes (Signed)
   Subjective: 1 Day Post-Op Procedure(s) (LRB): C3-4, C4-5, C5-6, C6-7 ANTERIOR CERVICAL DECOMPRESSION/DISCECTOMY FUSION, ALLOGRAFTS & PLATE (N/A) Patient reports pain as mild.    Objective: Vital signs in last 24 hours: Temp:  [97.2 F (36.2 C)-98.5 F (36.9 C)] 97.8 F (36.6 C) (07/02 0726) Pulse Rate:  [73-86] 81 (07/02 0726) Resp:  [11-20] 16 (07/02 0726) BP: (122-155)/(74-90) 153/89 (07/02 0726) SpO2:  [98 %-100 %] 100 % (07/02 0726) Weight:  [61.2 kg] 61.2 kg (07/01 1039)  Intake/Output from previous day: 07/01 0701 - 07/02 0700 In: 1000 [I.V.:500; IV Piggyback:500] Out: 860 [Drains:160; Blood:700] Intake/Output this shift: No intake/output data recorded.  Recent Labs    05/03/19 1046  HGB 10.5*   Recent Labs    05/03/19 1046  HCT 31.0*   Recent Labs    05/03/19 1046  NA 133*  K 4.2  GLUCOSE 88   No results for input(s): LABPT, INR in the last 72 hours.  Neurologically intact Dg Cervical Spine 2-3 Views  Result Date: 05/03/2019 CLINICAL DATA:  Cervical fusion EXAM: DG C-ARM 61-120 MIN; CERVICAL SPINE - 2-3 VIEW COMPARISON:  None. FLUOROSCOPY TIME:  Fluoroscopy Time:  36 seconds Radiation Exposure Index (if provided by the fluoroscopic device): Not available Number of Acquired Spot Images: 2 FINDINGS: Fusion is noted from C3-C7 with anterior fixation. Screws are identified at C4, C5, C6 and C7. No screws are noted on these images at the C3 level. IMPRESSION: Cervical fusion from C3-C7. Electronically Signed   By: Inez Catalina M.D.   On: 05/03/2019 17:26   Dg C-arm 1-60 Min  Result Date: 05/03/2019 CLINICAL DATA:  Cervical fusion EXAM: DG C-ARM 61-120 MIN; CERVICAL SPINE - 2-3 VIEW COMPARISON:  None. FLUOROSCOPY TIME:  Fluoroscopy Time:  36 seconds Radiation Exposure Index (if provided by the fluoroscopic device): Not available Number of Acquired Spot Images: 2 FINDINGS: Fusion is noted from C3-C7 with anterior fixation. Screws are identified at C4, C5, C6 and  C7. No screws are noted on these images at the C3 level. IMPRESSION: Cervical fusion from C3-C7. Electronically Signed   By: Inez Catalina M.D.   On: 05/03/2019 17:26    Assessment/Plan: 1 Day Post-Op Procedure(s) (LRB): C3-4, C4-5, C5-6, C6-7 ANTERIOR CERVICAL DECOMPRESSION/DISCECTOMY FUSION, ALLOGRAFTS & PLATE (N/A) Plan: discharge home , office one week   Maurice Bell 05/04/2019, 7:49 AM

## 2019-05-04 NOTE — Evaluation (Signed)
Physical Therapy Evaluation Patient Details Name: Maurice Bell MRN: [id number] DOB: 06/17/1956 Today's Date: 05/04/2019   History of Present Illness  Pt is a 63 y.o. M with significant PMH of ESRD on hemodialysis, hypertension, stroke HSV, thrombocytopenia who presents s/p C3-7 anterior cervical decompression/discectomy fusion, allografts and plate.  Clinical Impression  Patient evaluated by Physical Therapy with no further acute PT needs identified. Pt presenting with LLE radicular pain but seemingly good pain control post cervical surgery. Ambulating 400 feet without physical difficulty, does demonstrate improved gait speed with Rollator versus without. Education provided re: driving restriction, cervical precautions. All education has been completed and the patient has no further questions. No follow-up Physical Therapy or equipment needs. PT is signing off. Thank you for this referral.     Follow Up Recommendations No PT follow up;Supervision - Intermittent (would benefit from OPPT when appropriate per surgeon)    Equipment Recommendations  None recommended by PT    Recommendations for Other Services       Precautions / Restrictions Precautions Precautions: Fall;Cervical Precaution Booklet Issued: Yes (comment) Precaution Comments: verbally reviewed, provided written handout Required Braces or Orthoses: Cervical Brace Cervical Brace: Hard collar;At all times Restrictions Weight Bearing Restrictions: No      Mobility  Bed Mobility Overal bed mobility: Modified Independent             General bed mobility comments: cues for log roll technique  Transfers Overall transfer level: Modified independent Equipment used: None                Ambulation/Gait Ambulation/Gait assistance: Supervision Gait Distance (Feet): 400 Feet Assistive device: None; 4 wheeled walker Gait Pattern/deviations: Step-through pattern;Decreased weight shift to left     General Gait  Details: Good posture, one episode of lateral LOB but corrected without assist  Stairs            Wheelchair Mobility    Modified Rankin (Stroke Patients Only)       Balance Overall balance assessment: Mild deficits observed, not formally tested                                           Pertinent Vitals/Pain Pain Assessment: Faces Faces Pain Scale: Hurts little more Pain Location: LLE radicular pain Pain Descriptors / Indicators: Constant Pain Intervention(s): Monitored during session    Home Living Family/patient expects to be discharged to:: Private residence Living Arrangements: Children(son, Alvester Chou) Available Help at Discharge: Family;Available PRN/intermittently Type of Home: House Home Access: Level entry     Home Layout: One level Home Equipment: Walker - 2 wheels;Cane - single point;Walker - 4 wheels      Prior Function Level of Independence: Independent with assistive device(s)         Comments: Has been having to use Rollator recently, drives to dialysis, cares for mother with dementia     Hand Dominance   Dominant Hand: Right    Extremity/Trunk Assessment   Upper Extremity Assessment Upper Extremity Assessment: RUE deficits/detail;LUE deficits/detail RUE Deficits / Details: Grossly 4/5, strong hand grip LUE Deficits / Details: Grossly 4/5, strong handgrip    Lower Extremity Assessment Lower Extremity Assessment: RLE deficits/detail;LLE deficits/detail RLE Deficits / Details: Grossly 5/5 LLE Deficits / Details: Grossly 5/5 except hip flexion 4/5    Cervical / Trunk Assessment Cervical / Trunk Assessment: Other exceptions Cervical / Trunk Exceptions: s/p cervical sx  Communication   Communication: No difficulties  Cognition Arousal/Alertness: Awake/alert Behavior During Therapy: WFL for tasks assessed/performed Overall Cognitive Status: Within Functional Limits for tasks assessed                                         General Comments      Exercises     Assessment/Plan    PT Assessment Patent does not need any further PT services  PT Problem List         PT Treatment Interventions      PT Goals (Current goals can be found in the Care Plan section)  Acute Rehab PT Goals Patient Stated Goal: "not be in the hospital anymore." PT Goal Formulation: All assessment and education complete, DC therapy    Frequency     Barriers to discharge        Co-evaluation               AM-PAC PT "6 Clicks" Mobility  Outcome Measure Help needed turning from your back to your side while in a flat bed without using bedrails?: None Help needed moving from lying on your back to sitting on the side of a flat bed without using bedrails?: None Help needed moving to and from a bed to a chair (including a wheelchair)?: None Help needed standing up from a chair using your arms (e.g., wheelchair or bedside chair)?: None Help needed to walk in hospital room?: None Help needed climbing 3-5 steps with a railing? : A Little 6 Click Score: 23    End of Session Equipment Utilized During Treatment: Cervical collar Activity Tolerance: Patient tolerated treatment well Patient left: Other (comment)(at nurses station) Nurse Communication: Mobility status PT Visit Diagnosis: Unsteadiness on feet (R26.81);Other abnormalities of gait and mobility (R26.89);Pain Pain - part of body: (cervical)    Time: 6256-3893 PT Time Calculation (min) (ACUTE ONLY): 21 min   Charges:   PT Evaluation $PT Eval Low Complexity: 1 Low          Ellamae Sia, PT, DPT Acute Rehabilitation Services Pager [phone] Office [phone]   Willy Eddy 05/04/2019, 8:53 AM

## 2019-05-04 NOTE — Discharge Instructions (Signed)
Wear  hard Aspen collar at all times do not remove.  You do better with soft foods and liquids.  He can sleep in a recliner or propped up position for a couple weeks which would help with neck swelling.  Pain medication has been sent to your pharmacy.  See Dr. Lorin Mercy in 1 week.  Avoid lifting and bending.

## 2019-05-12 ENCOUNTER — Ambulatory Visit (INDEPENDENT_AMBULATORY_CARE_PROVIDER_SITE_OTHER): Payer: Medicare HMO | Admitting: Orthopaedic Surgery

## 2019-05-12 ENCOUNTER — Inpatient Hospital Stay (HOSPITAL_COMMUNITY)
Admission: RE | Admit: 2019-05-12 | Discharge: 2019-05-12 | Disposition: A | Discharge status: Home / Self Care | Payer: Medicare HMO | Source: Ambulatory Visit | Attending: Orthopaedic Surgery | Admitting: Orthopaedic Surgery

## 2019-05-12 ENCOUNTER — Encounter: Payer: Self-pay | Admitting: Orthopaedic Surgery

## 2019-05-12 ENCOUNTER — Ambulatory Visit (INDEPENDENT_AMBULATORY_CARE_PROVIDER_SITE_OTHER): Payer: Medicare HMO

## 2019-05-12 ENCOUNTER — Other Ambulatory Visit: Payer: Self-pay

## 2019-05-12 ENCOUNTER — Ambulatory Visit: Payer: Medicare HMO

## 2019-05-12 VITALS — Ht 65.0 in | Wt 135.0 lb

## 2019-05-12 DIAGNOSIS — M545 Low back pain: Secondary | ICD-10-CM

## 2019-05-12 DIAGNOSIS — Z981 Arthrodesis status: Secondary | ICD-10-CM

## 2019-05-12 DIAGNOSIS — M5136 Other intervertebral disc degeneration, lumbar region: Secondary | ICD-10-CM

## 2019-05-12 DIAGNOSIS — G8929 Other chronic pain: Secondary | ICD-10-CM
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
## 2019-05-12 NOTE — Discharge Summary (Signed)
Patient ID: Maurice Bell MRN: [id number] DOB/AGE: [birth year] 61 y.o.  Admit date: 05/03/2019 Discharge date: 05/12/2019  Admission Diagnoses:  Active Problems:   Spinal stenosis of cervical region   Cervical spinal stenosis   Discharge Diagnoses:  Active Problems:   Spinal stenosis of cervical region   Cervical spinal stenosis  status post Procedure(s): C3-4, C4-5, C5-6, C6-7 ANTERIOR CERVICAL DECOMPRESSION/DISCECTOMY FUSION, ALLOGRAFTS & PLATE  Past Medical History:  Diagnosis Date  . Anemia   . Anxiety   . Arthritis   . Depression   . Dyspnea    "only when I have too much fluid"  . ESRD (end stage renal disease) on dialysis ([place])    "[place]; TTS" (10/06/2018)  . GERD (gastroesophageal reflux disease)   . Hepatitis C    "never treated"   . History of anemia due to CKD   . Hypertension   . Myoclonic jerking   . Pneumonia   . Renal disorder   . Stroke ([place])    bilat leg weakness residual- "years ago" - has weakness at times  . Wears glasses   . Wears partial dentures    lower    Surgeries: Procedure(s): C3-4, C4-5, C5-6, C6-7 ANTERIOR CERVICAL DECOMPRESSION/DISCECTOMY FUSION, ALLOGRAFTS & PLATE on 06/09/4165   Consultants:   Discharged Condition: Improved  Hospital Course: Maurice Bell is an 63 y.o. male who was admitted 05/03/2019 for operative treatment of cervical stenosis. Patient failed conservative treatments (please see the history and physical for the specifics) and had severe unremitting pain that affects sleep, daily activities and work/hobbies. After pre-op clearance, the patient was taken to the operating room on 05/03/2019 and underwent  Procedure(s): C3-4, C4-5, C5-6, C6-7 ANTERIOR CERVICAL DECOMPRESSION/DISCECTOMY FUSION, ALLOGRAFTS & PLATE.    Patient was given perioperative antibiotics:  Anti-infectives (From admission, onward)   Start     Dose/Rate Route Frequency Ordered Stop   05/04/19 1000  valACYclovir (VALTREX) tablet 500 mg  Status:   Discontinued     500 mg Oral Daily 05/03/19 1833 05/04/19 1245   05/03/19 2200  ceFAZolin (ANCEF) IVPB 1 g/50 mL premix     1 g 100 mL/hr over 30 Minutes Intravenous Every 8 hours 05/03/19 1832 05/04/19 0538   05/03/19 1030  ceFAZolin (ANCEF) IVPB 2g/100 mL premix     2 g 200 mL/hr over 30 Minutes Intravenous On call to O.R. 05/03/19 1028 05/03/19 1310       Patient was given sequential compression devices and early ambulation to prevent DVT.   Patient benefited maximally from hospital stay and there were no complications. At the time of discharge, the patient was urinating/moving their bowels without difficulty, tolerating a regular diet, pain is controlled with oral pain medications and they have been cleared by PT/OT.   Recent vital signs: No data found.   Recent laboratory studies: No results for input(s): WBC, HGB, HCT, PLT, NA, K, CL, CO2, BUN, CREATININE, GLUCOSE, INR, CALCIUM in the last 72 hours.  Invalid input(s): PT, 2   Discharge Medications:   Allergies as of 05/04/2019      Reactions   Vicodin [hydrocodone-acetaminophen] Itching      Medication List    STOP taking these medications   Tylenol 8 Hour Arthritis Pain 650 MG CR tablet Generic drug: acetaminophen     TAKE these medications   amLODipine 10 MG tablet Commonly known as: NORVASC Take 1 tablet (10 mg total) by mouth at bedtime. What changed: when to take this   cloNIDine 0.1  MG tablet Commonly known as: CATAPRES Take 1 tablet (0.1 mg total) by mouth 2 (two) times daily. What changed: when to take this   cyanocobalamin 2000 MCG tablet Take 1 tablet (2,000 mcg total) by mouth daily.   HOMEOPATHIC PRODUCTS PO Take 2 tablets by mouth 3 (three) times daily as needed (pain/fatigue). Homeopathic MagniLife Pain & Fatigue Relief   hydrALAZINE 50 MG tablet Commonly known as: APRESOLINE Take 50 mg by mouth 2 (two) times a day.   Lokelma 10 g Pack packet Generic drug: sodium zirconium cyclosilicate Take  10 g by mouth every Monday.   metoprolol tartrate 50 MG tablet Commonly known as: LOPRESSOR Take 150 mg by mouth 2 (two) times daily.   multivitamin Tabs tablet Take 1 tablet by mouth at bedtime.   oxyCODONE-acetaminophen 5-325 MG tablet Commonly known as: Percocet Take 1-2 tablets by mouth every 6 (six) hours as needed for severe pain.   sevelamer carbonate 800 MG tablet Commonly known as: RENVELA Take 4 tablets (3,200 mg total) by mouth 3 (three) times daily with meals. What changed:   when to take this  additional instructions   thiamine 100 MG tablet Take 1 tablet (100 mg total) by mouth daily.   UNABLE TO FIND BP monitor  Diagnosis: Hypertension   valACYclovir 500 MG tablet Commonly known as: VALTREX Take 500 mg by mouth daily.       Diagnostic Studies: Dg Chest 2 View  Result Date: 04/27/2019 CLINICAL DATA:  Preop evaluation for cervical fusion EXAM: CHEST - 2 VIEW COMPARISON:  12/29/2018 FINDINGS: Left IJ dialysis catheter tip SVC RA junction. Normal heart size and vascularity. No focal pneumonia, collapse or consolidation. Negative for edema, effusion or pneumothorax. Trachea midline. No acute osseous finding. IMPRESSION: No active chest disease.  Stable exam. Electronically Signed   By: Jerilynn Mages.  Shick M.D.   On: 04/27/2019 09:11   Dg Cervical Spine 2-3 Views  Result Date: 05/03/2019 CLINICAL DATA:  Cervical fusion EXAM: DG C-ARM 61-120 MIN; CERVICAL SPINE - 2-3 VIEW COMPARISON:  None. FLUOROSCOPY TIME:  Fluoroscopy Time:  36 seconds Radiation Exposure Index (if provided by the fluoroscopic device): Not available Number of Acquired Spot Images: 2 FINDINGS: Fusion is noted from C3-C7 with anterior fixation. Screws are identified at C4, C5, C6 and C7. No screws are noted on these images at the C3 level. IMPRESSION: Cervical fusion from C3-C7. Electronically Signed   By: Inez Catalina M.D.   On: 05/03/2019 17:26   Dg C-arm 1-60 Min  Result Date: 05/03/2019 CLINICAL DATA:   Cervical fusion EXAM: DG C-ARM 61-120 MIN; CERVICAL SPINE - 2-3 VIEW COMPARISON:  None. FLUOROSCOPY TIME:  Fluoroscopy Time:  36 seconds Radiation Exposure Index (if provided by the fluoroscopic device): Not available Number of Acquired Spot Images: 2 FINDINGS: Fusion is noted from C3-C7 with anterior fixation. Screws are identified at C4, C5, C6 and C7. No screws are noted on these images at the C3 level. IMPRESSION: Cervical fusion from C3-C7. Electronically Signed   By: Inez Catalina M.D.   On: 05/03/2019 17:26    Discharge Instructions    Incentive spirometry RT   Complete by: As directed       Follow-up Information    Marybelle Killings, MD Follow up in 1 week(s).   Specialty: Orthopedic Surgery Contact information: [place] [address] [phone]           Discharge Plan:  discharge to home  Disposition:     Signed:  Benjiman Core for Rodell Perna MD 05/12/2019, 10:53 AM

## 2019-05-15 ENCOUNTER — Inpatient Hospital Stay (HOSPITAL_COMMUNITY): Payer: Medicare HMO | Admitting: Certified Registered"

## 2019-05-15 ENCOUNTER — Inpatient Hospital Stay (HOSPITAL_COMMUNITY)
Admission: RE | Admit: 2019-05-15 | Discharge: 2019-05-22 | DRG: 471 | Disposition: A | Discharge status: Skilled Nursing Facility | Payer: Medicare HMO | Source: Ambulatory Visit | Attending: Orthopaedic Surgery | Admitting: Orthopaedic Surgery

## 2019-05-15 ENCOUNTER — Encounter (HOSPITAL_COMMUNITY)
Admission: RE | Disposition: A | Discharge status: Skilled Nursing Facility | Payer: Self-pay | Source: Ambulatory Visit | Attending: Orthopaedic Surgery

## 2019-05-15 ENCOUNTER — Encounter (HOSPITAL_COMMUNITY): Payer: Self-pay | Admitting: Orthopedic Surgery

## 2019-05-15 ENCOUNTER — Other Ambulatory Visit: Payer: Self-pay

## 2019-05-15 ENCOUNTER — Inpatient Hospital Stay (HOSPITAL_COMMUNITY): Payer: Medicare HMO

## 2019-05-15 DIAGNOSIS — T8484XA Pain due to internal orthopedic prosthetic devices, implants and grafts, initial encounter: Secondary | ICD-10-CM | POA: Diagnosis present

## 2019-05-15 DIAGNOSIS — Z885 Allergy status to narcotic agent status: Secondary | ICD-10-CM

## 2019-05-15 DIAGNOSIS — Q613 Polycystic kidney, unspecified: Secondary | ICD-10-CM

## 2019-05-15 DIAGNOSIS — M4802 Spinal stenosis, cervical region: Secondary | ICD-10-CM | POA: Diagnosis not present

## 2019-05-15 DIAGNOSIS — R1310 Dysphagia, unspecified: Secondary | ICD-10-CM | POA: Diagnosis not present

## 2019-05-15 DIAGNOSIS — Z1159 Encounter for screening for other viral diseases: Secondary | ICD-10-CM

## 2019-05-15 DIAGNOSIS — D631 Anemia in chronic kidney disease: Secondary | ICD-10-CM | POA: Diagnosis present

## 2019-05-15 DIAGNOSIS — M4722 Other spondylosis with radiculopathy, cervical region: Secondary | ICD-10-CM | POA: Diagnosis present

## 2019-05-15 DIAGNOSIS — I120 Hypertensive chronic kidney disease with stage 5 chronic kidney disease or end stage renal disease: Secondary | ICD-10-CM | POA: Diagnosis present

## 2019-05-15 DIAGNOSIS — M4312 Spondylolisthesis, cervical region: Secondary | ICD-10-CM | POA: Diagnosis present

## 2019-05-15 DIAGNOSIS — M48061 Spinal stenosis, lumbar region without neurogenic claudication: Secondary | ICD-10-CM | POA: Diagnosis present

## 2019-05-15 DIAGNOSIS — I69354 Hemiplegia and hemiparesis following cerebral infarction affecting left non-dominant side: Secondary | ICD-10-CM | POA: Diagnosis not present

## 2019-05-15 DIAGNOSIS — N250 Renal osteodystrophy: Secondary | ICD-10-CM | POA: Diagnosis not present

## 2019-05-15 DIAGNOSIS — Z992 Dependence on renal dialysis: Secondary | ICD-10-CM | POA: Diagnosis not present

## 2019-05-15 DIAGNOSIS — N186 End stage renal disease: Secondary | ICD-10-CM | POA: Diagnosis present

## 2019-05-15 DIAGNOSIS — E785 Hyperlipidemia, unspecified: Secondary | ICD-10-CM | POA: Diagnosis present

## 2019-05-15 DIAGNOSIS — B1920 Unspecified viral hepatitis C without hepatic coma: Secondary | ICD-10-CM | POA: Diagnosis present

## 2019-05-15 DIAGNOSIS — M40202 Unspecified kyphosis, cervical region: Secondary | ICD-10-CM | POA: Diagnosis present

## 2019-05-15 DIAGNOSIS — Z419 Encounter for procedure for purposes other than remedying health state, unspecified: Secondary | ICD-10-CM

## 2019-05-15 DIAGNOSIS — K219 Gastro-esophageal reflux disease without esophagitis: Secondary | ICD-10-CM | POA: Diagnosis present

## 2019-05-15 DIAGNOSIS — N2581 Secondary hyperparathyroidism of renal origin: Secondary | ICD-10-CM | POA: Diagnosis present

## 2019-05-15 HISTORY — PX: POSTERIOR CERVICAL FUSION/FORAMINOTOMY: SHX5038

## 2019-05-15 HISTORY — PX: ANTERIOR CERVICAL DECOMPRESSION/DISCECTOMY FUSION 4 LEVEL/HARDWARE REMOVAL: SHX5551

## 2019-05-15 LAB — BASIC METABOLIC PANEL
Anion gap: 17 — ABNORMAL HIGH (ref 5–15)
BUN: 30 mg/dL — ABNORMAL HIGH (ref 6–20)
CO2: 23 mmol/L (ref 22–32)
Calcium: 9.5 mg/dL (ref 8.9–10.3)
Chloride: 96 mmol/L — ABNORMAL LOW (ref 98–111)
Creatinine, Ser: 9.32 mg/dL — ABNORMAL HIGH (ref 0.61–1.24)
GFR calc Af Amer: 6 mL/min — ABNORMAL LOW (ref >60)
GFR calc non Af Amer: 5 mL/min — ABNORMAL LOW (ref >60)
Glucose, Bld: 89 mg/dL (ref 70–99)
Potassium: 4 mmol/L (ref 3.5–5.1)
Sodium: 136 mmol/L (ref 135–145)

## 2019-05-15 LAB — CBC
HCT: 27.4 % — ABNORMAL LOW (ref 39.0–52.0)
Hemoglobin: 8.5 g/dL — ABNORMAL LOW (ref 13.0–17.0)
MCH: 35.6 pg — ABNORMAL HIGH (ref 26.0–34.0)
MCHC: 31 g/dL (ref 30.0–36.0)
MCV: 114.6 fL — ABNORMAL HIGH (ref 80.0–100.0)
Platelets: 189 10*3/uL (ref 150–400)
RBC: 2.39 MIL/uL — ABNORMAL LOW (ref 4.22–5.81)
RDW: 14.3 % (ref 11.5–15.5)
WBC: 4.5 10*3/uL (ref 4.0–10.5)
nRBC: 0 % (ref 0.0–0.2)

## 2019-05-15 LAB — SARS CORONAVIRUS 2 BY RT PCR (HOSPITAL ORDER, PERFORMED IN ~~LOC~~ HOSPITAL LAB): SARS Coronavirus 2: NEGATIVE

## 2019-05-15 LAB — PREPARE RBC (CROSSMATCH)

## 2019-05-15 SURGERY — ANTERIOR CERVICAL DECOMPRESSION/DISCECTOMY FUSION 4 LEVEL/HARDWARE REMOVAL
Anesthesia: General | Site: Neck

## 2019-05-15 MED ORDER — 0.9 % SODIUM CHLORIDE (POUR BTL) OPTIME
TOPICAL | Status: DC | PRN
  Administered 2019-05-15 (×3): 1000 mL

## 2019-05-15 MED ORDER — VANCOMYCIN HCL 500 MG IV SOLR
INTRAVENOUS | Status: DC | PRN
  Administered 2019-05-15: 500 mg

## 2019-05-15 MED ORDER — VALACYCLOVIR HCL 500 MG PO TABS
500.0000 mg | ORAL_TABLET | Freq: Every day | ORAL | Status: DC
  Administered 2019-05-15 – 2019-05-22 (×7): 500 mg via ORAL
  Filled 2019-05-15 (×8): qty 1

## 2019-05-15 MED ORDER — METOPROLOL TARTRATE 50 MG PO TABS
150.0000 mg | ORAL_TABLET | ORAL | Status: AC
  Administered 2019-05-15: 150 mg via ORAL
  Filled 2019-05-15: qty 1

## 2019-05-15 MED ORDER — ONDANSETRON HCL 4 MG/2ML IJ SOLN: 4.0000 mg | Freq: Four times a day (QID) | INTRAMUSCULAR | Status: DC | PRN

## 2019-05-15 MED ORDER — HYDRALAZINE HCL 50 MG PO TABS
50.0000 mg | ORAL_TABLET | Freq: Two times a day (BID) | ORAL | Status: DC
  Administered 2019-05-15 – 2019-05-16 (×3): 50 mg via ORAL
  Filled 2019-05-15 (×3): qty 1

## 2019-05-15 MED ORDER — EPHEDRINE 5 MG/ML INJ
INTRAVENOUS | Status: AC
  Filled 2019-05-15: qty 10

## 2019-05-15 MED ORDER — DOCUSATE SODIUM 100 MG PO CAPS
100.0000 mg | ORAL_CAPSULE | Freq: Two times a day (BID) | ORAL | Status: DC
  Administered 2019-05-15 – 2019-05-22 (×13): 100 mg via ORAL
  Filled 2019-05-15 (×14): qty 1

## 2019-05-15 MED ORDER — ONDANSETRON HCL 4 MG/2ML IJ SOLN
INTRAMUSCULAR | Status: AC
  Filled 2019-05-15: qty 2

## 2019-05-15 MED ORDER — LACTATED RINGERS IV SOLN
INTRAVENOUS | Status: DC
  Administered 2019-05-15 (×2): via INTRAVENOUS

## 2019-05-15 MED ORDER — VANCOMYCIN HCL 500 MG IV SOLR
INTRAVENOUS | Status: AC
  Filled 2019-05-15: qty 500

## 2019-05-15 MED ORDER — BUPIVACAINE HCL (PF) 0.25 % IJ SOLN
INTRAMUSCULAR | Status: AC
  Filled 2019-05-15: qty 30

## 2019-05-15 MED ORDER — ICY HOT 10-30 % EX STCK: 1.0000 "application " | CUTANEOUS | Status: DC | PRN

## 2019-05-15 MED ORDER — POLYETHYLENE GLYCOL 3350 17 G PO PACK: 17.0000 g | PACK | Freq: Every day | ORAL | Status: DC | PRN

## 2019-05-15 MED ORDER — DEXAMETHASONE SODIUM PHOSPHATE 10 MG/ML IJ SOLN
INTRAMUSCULAR | Status: DC | PRN
  Administered 2019-05-15: 10 mg via INTRAVENOUS

## 2019-05-15 MED ORDER — METOCLOPRAMIDE HCL 10 MG PO TABS: 5.0000 mg | ORAL_TABLET | Freq: Three times a day (TID) | ORAL | Status: DC | PRN

## 2019-05-15 MED ORDER — DEXAMETHASONE SODIUM PHOSPHATE 10 MG/ML IJ SOLN
INTRAMUSCULAR | Status: AC
  Filled 2019-05-15: qty 1

## 2019-05-15 MED ORDER — VITAMIN B-12 1000 MCG PO TABS
2000.0000 ug | ORAL_TABLET | Freq: Every day | ORAL | Status: DC
  Administered 2019-05-16 – 2019-05-22 (×6): 2000 ug via ORAL
  Filled 2019-05-15 (×8): qty 2

## 2019-05-15 MED ORDER — ARTIFICIAL TEARS OPHTHALMIC OINT
TOPICAL_OINTMENT | OPHTHALMIC | Status: AC
  Filled 2019-05-15: qty 3.5

## 2019-05-15 MED ORDER — PROPOFOL 10 MG/ML IV BOLUS
INTRAVENOUS | Status: AC
  Filled 2019-05-15: qty 40

## 2019-05-15 MED ORDER — BUPIVACAINE-EPINEPHRINE 0.5% -1:200000 IJ SOLN
INTRAMUSCULAR | Status: DC | PRN
  Administered 2019-05-15: 9 mL
  Administered 2019-05-15: 7 mL
  Administered 2019-05-15: 6 mL

## 2019-05-15 MED ORDER — VITAMIN B-1 100 MG PO TABS
100.0000 mg | ORAL_TABLET | Freq: Every day | ORAL | Status: DC
  Administered 2019-05-15 – 2019-05-22 (×7): 100 mg via ORAL
  Filled 2019-05-15: qty 2
  Filled 2019-05-15 (×8): qty 1

## 2019-05-15 MED ORDER — METHOCARBAMOL 1000 MG/10ML IJ SOLN
500.0000 mg | Freq: Four times a day (QID) | INTRAVENOUS | Status: DC | PRN
  Filled 2019-05-15: qty 5

## 2019-05-15 MED ORDER — MIDAZOLAM HCL 2 MG/2ML IJ SOLN
INTRAMUSCULAR | Status: DC | PRN
  Administered 2019-05-15: 2 mg via INTRAVENOUS

## 2019-05-15 MED ORDER — SODIUM ZIRCONIUM CYCLOSILICATE 10 G PO PACK
10.0000 g | PACK | ORAL | Status: DC
  Administered 2019-05-15 – 2019-05-22 (×2): 10 g via ORAL
  Filled 2019-05-15 (×2): qty 1

## 2019-05-15 MED ORDER — HEMOSTATIC AGENTS (NO CHARGE) OPTIME
TOPICAL | Status: DC | PRN
  Administered 2019-05-15: 1

## 2019-05-15 MED ORDER — VANCOMYCIN HCL 1000 MG IV SOLR
INTRAVENOUS | Status: AC
  Filled 2019-05-15: qty 1000

## 2019-05-15 MED ORDER — SODIUM CHLORIDE 0.9 % IV SOLN
INTRAVENOUS | Status: DC | PRN
  Administered 2019-05-15: 10:00:00 75 ug/min via INTRAVENOUS

## 2019-05-15 MED ORDER — FENTANYL CITRATE (PF) 100 MCG/2ML IJ SOLN
INTRAMUSCULAR | Status: AC
  Administered 2019-05-15: 17:00:00 25 ug via INTRAVENOUS
  Filled 2019-05-15: qty 2

## 2019-05-15 MED ORDER — OXYCODONE-ACETAMINOPHEN 5-325 MG PO TABS
1.0000 | ORAL_TABLET | Freq: Four times a day (QID) | ORAL | Status: DC | PRN
  Administered 2019-05-15 – 2019-05-22 (×25): 2 via ORAL
  Filled 2019-05-15 (×24): qty 2

## 2019-05-15 MED ORDER — METOPROLOL TARTRATE 50 MG PO TABS
150.0000 mg | ORAL_TABLET | Freq: Two times a day (BID) | ORAL | Status: DC
  Administered 2019-05-15 – 2019-05-22 (×10): 150 mg via ORAL
  Filled 2019-05-15 (×10): qty 3

## 2019-05-15 MED ORDER — BUPIVACAINE-EPINEPHRINE (PF) 0.5% -1:200000 IJ SOLN
INTRAMUSCULAR | Status: AC
  Filled 2019-05-15: qty 30

## 2019-05-15 MED ORDER — MORPHINE SULFATE (PF) 2 MG/ML IV SOLN
0.5000 mg | INTRAVENOUS | Status: DC | PRN
  Administered 2019-05-16 – 2019-05-18 (×7): 1 mg via INTRAVENOUS
  Filled 2019-05-15 (×7): qty 1

## 2019-05-15 MED ORDER — CLONIDINE HCL 0.1 MG PO TABS
0.1000 mg | ORAL_TABLET | ORAL | Status: AC
  Administered 2019-05-15: 0.1 mg via ORAL
  Filled 2019-05-15: qty 1

## 2019-05-15 MED ORDER — CEFAZOLIN SODIUM-DEXTROSE 1-4 GM/50ML-% IV SOLN
1.0000 g | Freq: Four times a day (QID) | INTRAVENOUS | Status: AC
  Administered 2019-05-15 – 2019-05-16 (×3): 1 g via INTRAVENOUS
  Filled 2019-05-15 (×3): qty 50

## 2019-05-15 MED ORDER — SODIUM CHLORIDE 0.9% IV SOLUTION: Freq: Once | INTRAVENOUS | Status: DC

## 2019-05-15 MED ORDER — SODIUM CHLORIDE 0.9 % IV SOLN
INTRAVENOUS | Status: DC | PRN
  Administered 2019-05-15: 09:00:00 via INTRAVENOUS

## 2019-05-15 MED ORDER — SEVELAMER CARBONATE 800 MG PO TABS
3200.0000 mg | ORAL_TABLET | Freq: Three times a day (TID) | ORAL | Status: DC
  Administered 2019-05-16 – 2019-05-17 (×2): 3200 mg via ORAL
  Administered 2019-05-17: 08:00:00 1600 mg via ORAL
  Administered 2019-05-17 – 2019-05-22 (×10): 3200 mg via ORAL
  Filled 2019-05-15 (×15): qty 4

## 2019-05-15 MED ORDER — PHENYLEPHRINE 40 MCG/ML (10ML) SYRINGE FOR IV PUSH (FOR BLOOD PRESSURE SUPPORT)
PREFILLED_SYRINGE | INTRAVENOUS | Status: AC
  Filled 2019-05-15: qty 10

## 2019-05-15 MED ORDER — CEFAZOLIN SODIUM-DEXTROSE 2-4 GM/100ML-% IV SOLN
2.0000 g | INTRAVENOUS | Status: AC
  Administered 2019-05-15 (×2): 2 g via INTRAVENOUS
  Filled 2019-05-15: qty 100

## 2019-05-15 MED ORDER — HYDRALAZINE HCL 50 MG PO TABS
50.0000 mg | ORAL_TABLET | ORAL | Status: AC
  Administered 2019-05-15: 50 mg via ORAL
  Filled 2019-05-15: qty 1

## 2019-05-15 MED ORDER — FENTANYL CITRATE (PF) 100 MCG/2ML IJ SOLN
INTRAMUSCULAR | Status: DC | PRN
  Administered 2019-05-15: 100 ug via INTRAVENOUS
  Administered 2019-05-15 (×5): 50 ug via INTRAVENOUS
  Administered 2019-05-15: 100 ug via INTRAVENOUS
  Administered 2019-05-15: 50 ug via INTRAVENOUS

## 2019-05-15 MED ORDER — PROPOFOL 10 MG/ML IV BOLUS
INTRAVENOUS | Status: DC | PRN
  Administered 2019-05-15: 120 mg via INTRAVENOUS

## 2019-05-15 MED ORDER — LIDOCAINE 2% (20 MG/ML) 5 ML SYRINGE
INTRAMUSCULAR | Status: DC | PRN
  Administered 2019-05-15: 60 mg via INTRAVENOUS

## 2019-05-15 MED ORDER — LIDOCAINE 2% (20 MG/ML) 5 ML SYRINGE
INTRAMUSCULAR | Status: AC
  Filled 2019-05-15: qty 5

## 2019-05-15 MED ORDER — ONDANSETRON HCL 4 MG/2ML IJ SOLN
INTRAMUSCULAR | Status: DC | PRN
  Administered 2019-05-15: 4 mg via INTRAVENOUS

## 2019-05-15 MED ORDER — PROPOFOL 1000 MG/100ML IV EMUL
INTRAVENOUS | Status: AC
  Filled 2019-05-15: qty 100

## 2019-05-15 MED ORDER — METHOCARBAMOL 500 MG PO TABS
500.0000 mg | ORAL_TABLET | Freq: Three times a day (TID) | ORAL | Status: DC | PRN
  Administered 2019-05-15 – 2019-05-22 (×16): 500 mg via ORAL
  Filled 2019-05-15 (×17): qty 1

## 2019-05-15 MED ORDER — FENTANYL CITRATE (PF) 100 MCG/2ML IJ SOLN
25.0000 ug | INTRAMUSCULAR | Status: DC | PRN
  Administered 2019-05-15 (×2): 25 ug via INTRAVENOUS

## 2019-05-15 MED ORDER — ONDANSETRON HCL 4 MG PO TABS: 4.0000 mg | ORAL_TABLET | Freq: Four times a day (QID) | ORAL | Status: DC | PRN

## 2019-05-15 MED ORDER — OXYCODONE HCL 5 MG PO TABS: 5.0000 mg | ORAL_TABLET | Freq: Once | ORAL | Status: DC | PRN

## 2019-05-15 MED ORDER — PHENYLEPHRINE 40 MCG/ML (10ML) SYRINGE FOR IV PUSH (FOR BLOOD PRESSURE SUPPORT)
PREFILLED_SYRINGE | INTRAVENOUS | Status: DC | PRN
  Administered 2019-05-15 (×2): 120 ug via INTRAVENOUS

## 2019-05-15 MED ORDER — MIDAZOLAM HCL 2 MG/2ML IJ SOLN
INTRAMUSCULAR | Status: AC
  Filled 2019-05-15: qty 2

## 2019-05-15 MED ORDER — FENTANYL CITRATE (PF) 250 MCG/5ML IJ SOLN
INTRAMUSCULAR | Status: AC
  Filled 2019-05-15: qty 5

## 2019-05-15 MED ORDER — SUCCINYLCHOLINE CHLORIDE 200 MG/10ML IV SOSY
PREFILLED_SYRINGE | INTRAVENOUS | Status: DC | PRN
  Administered 2019-05-15: 200 mg via INTRAVENOUS

## 2019-05-15 MED ORDER — SEVELAMER CARBONATE 800 MG PO TABS
1600.0000 mg | ORAL_TABLET | ORAL | Status: DC | PRN
  Administered 2019-05-19 – 2019-05-20 (×2): 1600 mg via ORAL
  Filled 2019-05-15: qty 2

## 2019-05-15 MED ORDER — CEFAZOLIN SODIUM 1 G IJ SOLR
INTRAMUSCULAR | Status: AC
  Filled 2019-05-15: qty 20

## 2019-05-15 MED ORDER — CHLORHEXIDINE GLUCONATE 4 % EX LIQD: 60.0000 mL | Freq: Once | CUTANEOUS | Status: DC

## 2019-05-15 MED ORDER — CLONIDINE HCL 0.1 MG PO TABS
0.1000 mg | ORAL_TABLET | Freq: Two times a day (BID) | ORAL | Status: DC
  Administered 2019-05-15 – 2019-05-22 (×10): 0.1 mg via ORAL
  Filled 2019-05-15 (×10): qty 1

## 2019-05-15 MED ORDER — AMLODIPINE BESYLATE 10 MG PO TABS
10.0000 mg | ORAL_TABLET | Freq: Every day | ORAL | Status: DC
  Administered 2019-05-15 – 2019-05-22 (×7): 10 mg via ORAL
  Filled 2019-05-15: qty 1
  Filled 2019-05-15: qty 2
  Filled 2019-05-15 (×5): qty 1

## 2019-05-15 MED ORDER — MUSCLE RUB 10-15 % EX CREA
TOPICAL_CREAM | CUTANEOUS | Status: DC | PRN
  Filled 2019-05-15: qty 85

## 2019-05-15 MED ORDER — SODIUM CHLORIDE 0.9% FLUSH: 3.0000 mL | INTRAVENOUS | Status: DC | PRN

## 2019-05-15 MED ORDER — ACETAMINOPHEN 325 MG PO TABS
325.0000 mg | ORAL_TABLET | Freq: Four times a day (QID) | ORAL | Status: DC | PRN
  Administered 2019-05-16: 500 mg via ORAL
  Administered 2019-05-21: 650 mg via ORAL
  Administered 2019-05-22: 09:00:00 325 mg via ORAL
  Filled 2019-05-15: qty 2
  Filled 2019-05-15: qty 1

## 2019-05-15 MED ORDER — ACETAMINOPHEN 500 MG PO TABS
500.0000 mg | ORAL_TABLET | Freq: Four times a day (QID) | ORAL | Status: AC
  Administered 2019-05-16 (×3): 500 mg via ORAL
  Filled 2019-05-15 (×3): qty 1

## 2019-05-15 MED ORDER — OXYCODONE HCL 5 MG/5ML PO SOLN: 5.0000 mg | Freq: Once | ORAL | Status: DC | PRN

## 2019-05-15 MED ORDER — METOCLOPRAMIDE HCL 5 MG/ML IJ SOLN: 5.0000 mg | Freq: Three times a day (TID) | INTRAMUSCULAR | Status: DC | PRN

## 2019-05-15 MED ORDER — RENA-VITE PO TABS
1.0000 | ORAL_TABLET | Freq: Every day | ORAL | Status: DC
  Administered 2019-05-15 – 2019-05-21 (×7): 1 via ORAL
  Filled 2019-05-15 (×7): qty 1

## 2019-05-15 MED ORDER — PROPOFOL 500 MG/50ML IV EMUL
INTRAVENOUS | Status: DC | PRN
  Administered 2019-05-15: 65 ug/kg/min via INTRAVENOUS

## 2019-05-15 SURGICAL SUPPLY — 102 items
ADH SKN CLS APL DERMABOND .7 (GAUZE/BANDAGES/DRESSINGS) ×1
AGENT HMST KT MTR STRL THRMB (HEMOSTASIS) ×1
APL SKNCLS STERI-STRIP NONHPOA (GAUZE/BANDAGES/DRESSINGS) ×2
BENZOIN TINCTURE PRP APPL 2/3 (GAUZE/BANDAGES/DRESSINGS) ×4 IMPLANT
BIT DRILL MOUNTAINEER FIX 14 (BIT) ×1
BIT DRILL MOUNTAINEER FIX 14MM (BIT) IMPLANT
BIT DRILL SKYLINE 12MM (BIT) IMPLANT
BLADE SURG 15 STRL LF DISP TIS (BLADE) ×1 IMPLANT
BLADE SURG 15 STRL SS (BLADE) ×3
BONE CERV LORDOTIC 14.5X12X10 (Bone Implant) ×3 IMPLANT
BONE VIVIGEN FORMABLE 10CC (Bone Implant) ×3 IMPLANT
BUR ROUND FLUTED 4 SOFT TCH (BURR) ×2 IMPLANT
BUR ROUND FLUTED 4MM SOFT TCH (BURR) ×1
CANISTER SUCT 3000ML PPV (MISCELLANEOUS) ×3 IMPLANT
CLOSURE STERI-STRIP 1/2X4 (GAUZE/BANDAGES/DRESSINGS) ×2
CLOSURE WOUND 1/2 X4 (GAUZE/BANDAGES/DRESSINGS) ×2
CLSR STERI-STRIP ANTIMIC 1/2X4 (GAUZE/BANDAGES/DRESSINGS) ×3 IMPLANT
CORD BIPOLAR FORCEPS 12FT (ELECTRODE) ×5 IMPLANT
COVER SURGICAL LIGHT HANDLE (MISCELLANEOUS) ×5 IMPLANT
COVER WAND RF STERILE (DRAPES) ×3 IMPLANT
DECANTER SPIKE VIAL GLASS SM (MISCELLANEOUS) ×3 IMPLANT
DERMABOND ADVANCED (GAUZE/BANDAGES/DRESSINGS) ×2
DERMABOND ADVANCED .7 DNX12 (GAUZE/BANDAGES/DRESSINGS) ×1 IMPLANT
DRAPE C-ARM 42X72 X-RAY (DRAPES) ×4 IMPLANT
DRAPE C-ARMOR (DRAPES) ×4 IMPLANT
DRAPE HALF SHEET 40X57 (DRAPES) ×6 IMPLANT
DRAPE LAPAROTOMY T 98X78 PEDS (DRAPES) ×2 IMPLANT
DRAPE MICROSCOPE LEICA (MISCELLANEOUS) ×3 IMPLANT
DRAPE SURG 17X23 STRL (DRAPES) ×21 IMPLANT
DRILL BIT MOUNTAINEER FIX 14MM (BIT) ×3
DRILL BIT SKYLINE 12MM (BIT) ×3
DRSG MEPILEX BORDER 4X4 (GAUZE/BANDAGES/DRESSINGS) ×3 IMPLANT
DRSG MEPILEX BORDER 4X8 (GAUZE/BANDAGES/DRESSINGS) ×3 IMPLANT
DURAPREP 26ML APPLICATOR (WOUND CARE) ×5 IMPLANT
ELECT COATED BLADE 2.86 ST (ELECTRODE) ×2 IMPLANT
ELECT REM PT RETURN 9FT ADLT (ELECTROSURGICAL) ×6
ELECTRODE REM PT RTRN 9FT ADLT (ELECTROSURGICAL) ×1 IMPLANT
EVACUATOR 1/8 PVC DRAIN (DRAIN) ×2 IMPLANT
FEE INTRAOP MONITOR IMPULS NCS (MISCELLANEOUS) IMPLANT
GAUZE SPONGE 4X4 12PLY STRL (GAUZE/BANDAGES/DRESSINGS) ×4 IMPLANT
GAUZE XEROFORM 5X9 LF (GAUZE/BANDAGES/DRESSINGS) ×3 IMPLANT
GLOVE BIOGEL PI IND STRL 8 (GLOVE) ×2 IMPLANT
GLOVE BIOGEL PI INDICATOR 8 (GLOVE) ×4
GLOVE ORTHO TXT STRL SZ7.5 (GLOVE) ×10 IMPLANT
GOWN STRL REUS W/ TWL LRG LVL3 (GOWN DISPOSABLE) ×2 IMPLANT
GOWN STRL REUS W/ TWL XL LVL3 (GOWN DISPOSABLE) ×1 IMPLANT
GOWN STRL REUS W/TWL 2XL LVL3 (GOWN DISPOSABLE) ×5 IMPLANT
GOWN STRL REUS W/TWL LRG LVL3 (GOWN DISPOSABLE) ×6
GOWN STRL REUS W/TWL XL LVL3 (GOWN DISPOSABLE) ×6
GRAFT BNE MATRIX VG FRMBL L 10 (Bone Implant) IMPLANT
GRAFT BNE SPCR VG2 14.5X12X10 (Bone Implant) IMPLANT
INTRAOP MONITOR FEE IMPULS NCS (MISCELLANEOUS) ×1
INTRAOP MONITOR FEE IMPULSE (MISCELLANEOUS) ×2
KIT BASIN OR (CUSTOM PROCEDURE TRAY) ×3 IMPLANT
KIT TURNOVER KIT B (KITS) ×3 IMPLANT
MANIFOLD NEPTUNE II (INSTRUMENTS) ×3 IMPLANT
NDL 1/2 CIR MAYO (NEEDLE) IMPLANT
NDL HYPO 25GX1X1/2 BEV (NEEDLE) ×1 IMPLANT
NDL SPNL 18GX3.5 QUINCKE PK (NEEDLE) ×1 IMPLANT
NEEDLE 1/2 CIR MAYO (NEEDLE) IMPLANT
NEEDLE HYPO 25GX1X1/2 BEV (NEEDLE) ×3 IMPLANT
NEEDLE SPNL 18GX3.5 QUINCKE PK (NEEDLE) ×3 IMPLANT
NS IRRIG 1000ML POUR BTL (IV SOLUTION) ×3 IMPLANT
PACK LAMINECTOMY ORTHO (CUSTOM PROCEDURE TRAY) ×3 IMPLANT
PACK ORTHO CERVICAL (CUSTOM PROCEDURE TRAY) ×3 IMPLANT
PAD ARMBOARD 7.5X6 YLW CONV (MISCELLANEOUS) ×8 IMPLANT
PATTIES SURGICAL .5 X.5 (GAUZE/BANDAGES/DRESSINGS) ×2 IMPLANT
PATTIES SURGICAL .75X.75 (GAUZE/BANDAGES/DRESSINGS) IMPLANT
ROD LORD TI 3.5X70 (Rod) ×4 IMPLANT
SCREW 4.0 PLY 3.5X14 (Screw) ×12 IMPLANT
SCREW 4.0 PLY 3.5X16 (Screw) ×10 IMPLANT
SCREW SKYLINE VAR OS 14MM (Screw) ×2 IMPLANT
SCREW SKYLINE VARIABLE LG (Screw) ×4 IMPLANT
SCREW VAR SELF TAP SKYLINE 14M (Screw) ×4 IMPLANT
SCREW VARIABLE SELF TAP 12MM (Screw) ×12 IMPLANT
SPONGE LAP 4X18 RFD (DISPOSABLE) ×9 IMPLANT
SPONGE SURGIFOAM ABS GEL 100 (HEMOSTASIS) IMPLANT
STAPLER VISISTAT 35W (STAPLE) IMPLANT
STRIP CLOSURE SKIN 1/2X4 (GAUZE/BANDAGES/DRESSINGS) ×2 IMPLANT
SURGIFLO W/THROMBIN 8M KIT (HEMOSTASIS) ×2 IMPLANT
SUT BONE WAX W31G (SUTURE) ×3 IMPLANT
SUT STEEL 1 (SUTURE) IMPLANT
SUT STEEL 2 (SUTURE) IMPLANT
SUT VIC AB 0 CT1 27 (SUTURE) ×3
SUT VIC AB 0 CT1 27XBRD ANBCTR (SUTURE) ×1 IMPLANT
SUT VIC AB 1 CTX 36 (SUTURE) ×3
SUT VIC AB 1 CTX36XBRD ANBCTR (SUTURE) ×1 IMPLANT
SUT VIC AB 2-0 CT1 27 (SUTURE) ×6
SUT VIC AB 2-0 CT1 TAPERPNT 27 (SUTURE) ×2 IMPLANT
SUT VIC AB 3-0 X1 27 (SUTURE) ×3 IMPLANT
SUT VICRYL 4-0 PS2 18IN ABS (SUTURE) ×2 IMPLANT
SYR CONTROL 10ML LL (SYRINGE) ×2 IMPLANT
TAP MOUNTAINEER 3MM (TAP) ×2 IMPLANT
TAPE CLOTH SURG 4X10 WHT LF (GAUZE/BANDAGES/DRESSINGS) ×4 IMPLANT
TEMPLATE SYMPHONY ROD 3.5X240 (MISCELLANEOUS) ×2 IMPLANT
TOWEL GREEN STERILE (TOWEL DISPOSABLE) ×3 IMPLANT
TOWEL GREEN STERILE FF (TOWEL DISPOSABLE) ×3 IMPLANT
TRAY FOLEY MTR SLVR 16FR STAT (SET/KITS/TRAYS/PACK) IMPLANT
TUBE CONNECTING 12'X1/4 (SUCTIONS) ×1
TUBE CONNECTING 12X1/4 (SUCTIONS) ×1 IMPLANT
WATER STERILE IRR 1000ML POUR (IV SOLUTION) ×3 IMPLANT
YANKAUER SUCT BULB TIP NO VENT (SUCTIONS) ×5 IMPLANT

## 2019-05-15 NOTE — Progress Notes (Signed)
Post op in PACU, pt with some tongue swelling, falls asleep and obstruction noted, has nasal trumpet in currently with good oxygenation . Snorring. MAE. Right UE stronger. Complaining of leg pain. Lumbar MRI showed spondylosis and collapse at single level.  Neck normal post op swelling.  May need ICU vs stepdown, anesthesiologist to recheck .   My cell [phone]

## 2019-05-15 NOTE — Op Note (Addendum)
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  Kyphosis at C3-4 with cord compression.  Postop diagnosis: Same  Procedure: Removal of 4 level anterior cervical plate and screws U9-N2.  C3 near complete corpectomy and partial C C4 vertebral body removal.  Removal of C3-4 allograft and replacement with 10 mm cortical cancellus VG-2 allograft.  Repeat 4 level plating with combination of regular and rescue screws 12 and 14 mm. Patient repositioned prone after above wound closure and 4 level posterior fusion with lateral mass screws for additional fixation.  Vivagen 10 cc posterior grafting.  Surgeon: Rodell Perna, MD anterior procedure with  Basil Dess MD assisting. Posterior fusion performed by Dr. Basil Dess with Dr. Rodell Perna assisting  Assistant: Basil Dess MD assisting on anterior procedure.  Dr. Rodell Perna assisting on posterior procedure.  Second assist: Benjiman Core, PA-C  Anesthesia General oral tracheal.  Spinal cord monitoring for anterior and posterior procedures.  Marcaine 6 cc skin anteriorly and 15 cc posteriorly.  Drains: 1 Hemovac anterior.  No drain posteriorly.  Explants: Skyline Synthes 60 mm plate with 12 mm screws x10.  Removal of C3-4 allograft.  Implants replacement skyline 60 mm plate with 10 mm graft at C3-4.  14 mm rescue screws at C3.  12 mm screws x6 to 12 mm rescue screws due to poor bone quality from this renal osteodystrophy.( anterior procedure) Implants: Posterior procedure Symphony posterior screw system.  3.5 x 14 mm screws at C3, C4.  Left 3.516 and right 3.5 x 14 mm screws.  3.5 x 16 mm screws at C6 and C7.  70 mm rods x2.  Vivigen  10 cc augmentation of fusion   Brief history: Long-term dialysis patient with rapidly progressing renal osteodystrophy multilevel collapse and stenosis in the cervical spine with retrolisthesis and kyphosis  requiring 4 level cervical procedure on 05/03/2019.  He was seen 1 week postop and plain radiographs showed that the C3 vertebral body had retrolisthesis at least 4 mm with compression and patient has had increased back pain with MRI also showing collapse of the L4-5 disc space which was labeled L5-S1 on the MRI scan but appear to be L4-5 on plain radiographs since last fall x-rays.  He was using a walker with weakness is difficult to determine if this was related to the cervical stenosis recurrent with recurrent kyphosis at C3 or related to the lumbar spine.  Emergent MRI scan was performed of the lumbar spine and cervical spine which showed stenosis at C3-4 with retrolisthesis at C3 and significant kyphosis.  Due to cord compression loss of fixation we had originally planned to have him return to the operating room in 4 weeks for posterior instrumented fusion he had been compliant maintained in his Aspen collar.  With the recurrent kyphosis he was brought back on an urgent basis to the operating room for re-fixation anteriorly and then flipping them posteriorly and fusing them posteriorly for additional stability to prevent further collapse.  Informed consent was obtained.  Dr. Donavan Burnet my partner agreed to assist in the procedure and this was discussed with patient.  Procedure: After the induction of general anesthesia with glide scope intubation with the patient and his collar once his airways establish had ultra traction was applied and the Aspen collar was removed arms were tucked at the side restraints applied yellow foam ring was used on the head and gel back behind the shoulder  blades.  Patient had had a vertical incision on the left side of the neck and this was prepped with DuraPrep the area squared with towel sterile skin marker was used to outline the old incision and sterile male standard the head thyroid sheets and drapes were applied timeout procedure was completed.  Procedure was started with Benjiman Core, PA-C assisting with exposure.  Sutures removed we progressed down to the plate and tiny locking screwdriver was used on locking screw and all screws removed in C3, C4, C5, C6 and C7 and plate was removed.  Grafts below the C3-4 level were manually tested and were tight and secure.  Graft at C3-4 was loose was removed and fluoroscopic visualization was used with a small ball-tipped probe and the C4 screw holes for confirmation and the top 1/5 of L4 was removed in order to get to some of the portion of C3 vertebrae that it slid back behind C4 with the kyphosis and retrolisthesis.  Three fourths of the posterior cortex of C3 was removed which only left a sliver of a few millimeters at the top of C3.  There was soft tissue debris causing cord compression and this was decompressed using intermittent Surgi-Flo.  With patient's renal osteodystrophy there is a generous layer of fibrotic tissue present and epidural bleeding was controlled stopping intermittently to place patties.  Some epidural bleeders were coagulated with the bipolar cautery carefully protecting the dura.  Once continued decompression was performed removing all bone for exposure over the cord attempts was made to put a small black nerve hook behind the C3 vertebral body top portion and plate forward and it was fixed and did not want to move.  Patient's MRI scan showed significant facet arthropathy at that level which had also been visualized on the cervical CT scan that is been performed 03/12/2019.  Graft was carefully placed with had ultra traction applied by the CRNA and intermittent motor evoked potentials were checked which showed improvement once the graft was placed.  Plate was then reapplied holes were filled and those holes that did not have good purchase rescue screws were placed.  Patient had 12 mm screws was not large individual and at the C3 level due to the retrolisthesis 14 mm length screws were placed rescue screws to get better  purchase.  They were able to sit into the C3 vertebral body which coursed with his renal osteodystrophy the bone was soft but it did not violate the C2--3 disc space and gave adequate purchase.  Intermittent fluoroscopic pictures AP and lateral were checked.  Plate screws and grafts were all in good position.  There is still slight retrolisthesis.  Wound was checked for bleeders careful hemostasis repeat irrigation.  Ancef had been given prophylactically and was redosed when patient was flipped prone sensitive been 3 hours.  Platysma was reapproximated with 3-0 Vicryl 4-0 Vicryl subcuticular closure Marcaine infiltration 6 cc tincture benzoin Steri-Strips 4 x 4 tape and reapplication of the Aspen collar.  Dr. Elsworth Soho had assisted once the plate had been removed as described above Dr. Donavan Burnet assisted with the use of the bur to perform the corpectomy at C3 with decompression of the dura placement of the graft and repeat plating.  Patient was then flipped over to another operating table in the prone position on chest rolls with a horseshoe head holder.  Careful position was performed arms tucked to the side with yellow pads over the ulnar nerve.  Neck was prepped with DuraPrep C arm  was brought in and there was improved and less retrolisthesis with the patient in the prone position.  Dr. Louanne Skye exposed the midline from C3-C7 with Benjiman Core assisting electrical bathroom break and then returned to assist Dr. Donavan Burnet with the posterior instrumented fusion.  C-arm was brought in and once we could visualize lateral mass from C3-C7 outline of the lateral mass was performed marked with an X starting 1 mm medial 1 mm inferior to the axis in the middle of the lateral mass angling 30 degrees cephalad and 10 degrees lateral hole was carefully started progressed felt with the ball-tipped probe tapped repeat feeling with a tiny Boulter probe to make sure was completely in bone and then placement of the 14 mm lateral mass screws.   And C6 and C7 16 mm length screws were placed all the screws were 3.5 and this was the Symphony Synthes posterior lateral mass system.  Process was repeated all screws initially one screw been placed on the left at C2 which was removed.  Laskey was placed at C7 on the left.  All screws were in bone and 70 mm rods were then selected x2 placed.  Caps were placed locked down tightly without compression.  Intermittent fluoroscopy AP and lateral was used and this showed improvement of the retrolisthesis good position of the screws all screws were in the lateral mass.  No screws appear to lateral none appeared medial in the canal.  Rods were click down on the caps securely.  Bone was then decorticated by Dr. Basil Dess and vivid gin was packed and on each side with last extra bit packed high up at the C3-4 level.  Entire strong construct was secure.  Copious irrigation.  I then closed the wound with 0 Vicryl interrupted sutures 2-0 Vicryl subtenons tissue skin staple closure Marcaine infiltration postop dressing reapplication of the patient's Aspen collar and then patient was placed in a supine extubated and transferred to care room in stable condition.  Instrument count needle count was correct.  Motor evoked potentials showed improvement at the end of the case both right and left.

## 2019-05-15 NOTE — Anesthesia Procedure Notes (Signed)
Procedure Name: Intubation Date/Time: 05/15/2019 9:30 AM Performed by: Leonor Liv, CRNA Pre-anesthesia Checklist: Patient identified, Emergency Drugs available, Suction available and Patient being monitored Patient Re-evaluated:Patient Re-evaluated prior to induction Oxygen Delivery Method: Circle System Utilized Preoxygenation: Pre-oxygenation with 100% oxygen Induction Type: IV induction and Rapid sequence Laryngoscope Size: Glidescope and 4 Grade View: Grade II Tube type: Oral Tube size: 7.5 mm Number of attempts: 1 Airway Equipment and Method: Oral airway,  Video-laryngoscopy and Rigid stylet Placement Confirmation: ETT inserted through vocal cords under direct vision,  positive ETCO2 and breath sounds checked- equal and bilateral Secured at: 23 cm Tube secured with: Tape Dental Injury: Teeth and Oropharynx as per pre-operative assessment  Difficulty Due To: Difficulty was anticipated, Difficult Airway- due to cervical collar and Difficult Airway- due to reduced neck mobility

## 2019-05-15 NOTE — Anesthesia Preprocedure Evaluation (Addendum)
Anesthesia Evaluation  Patient identified by MRN, date of birth, ID band Patient awake    Reviewed: Allergy & Precautions, H&P , NPO status , Patient's Chart, lab work & pertinent test results, reviewed documented beta blocker date and time   History of Anesthesia Complications Negative for: history of anesthetic complications  Airway Mallampati: II  TM Distance: >3 FB Neck ROM: Full    Dental no notable dental hx. (+) Missing, Dental Advisory Given   Pulmonary shortness of breath, pneumonia, former smoker,    Pulmonary exam normal breath sounds clear to auscultation       Cardiovascular hypertension, Pt. on medications and Pt. on home beta blockers Normal cardiovascular exam Rhythm:Regular Rate:Normal     Neuro/Psych PSYCHIATRIC DISORDERS Anxiety Depression CVA    GI/Hepatic GERD  ,(+) Hepatitis -, C  Endo/Other  negative endocrine ROS  Renal/GU ESRF and DialysisRenal disease     Musculoskeletal  (+) Arthritis ,   Abdominal   Peds  Hematology  (+) Blood dyscrasia, anemia ,   Anesthesia Other Findings   Reproductive/Obstetrics                            Anesthesia Physical  Anesthesia Plan  ASA: III  Anesthesia Plan: General   Post-op Pain Management:    Induction: Intravenous  PONV Risk Score and Plan: 3 and Ondansetron, Treatment may vary due to age or medical condition, Midazolam and Dexamethasone  Airway Management Planned: Oral ETT  Additional Equipment: Arterial line  Intra-op Plan:   Post-operative Plan: Extubation in OR  Informed Consent: I have reviewed the patients History and Physical, chart, labs and discussed the procedure including the risks, benefits and alternatives for the proposed anesthesia with the patient or authorized representative who has indicated his/her understanding and acceptance.     Dental advisory given  Plan Discussed with: CRNA,  Anesthesiologist and Surgeon  Anesthesia Plan Comments:        Anesthesia Quick Evaluation

## 2019-05-15 NOTE — Transfer of Care (Signed)
Immediate Anesthesia Transfer of Care Note  Patient: Maurice Bell  Procedure(s) Performed: REMOVAL OF C3-C7 PLATE, REVISION C3-4 FUSION WITH PARTIAL CORPECTOMY AT C3 AND REPLATING (N/A Neck) POSTERIOR CERVICAL FUSION/FORAMINOTOMY C3-7 WITH LATERAL MASS INSTRUMENTATION (N/A Neck)  Patient Location: PACU  Anesthesia Type:General  Level of Consciousness: drowsy and patient cooperative  Airway & Oxygen Therapy: Patient Spontanous Breathing, Patient connected to nasal cannula oxygen and nasal trumpet 8.5 left nare   Post-op Assessment: Report given to RN, Post -op Vital signs reviewed and stable and Patient moving all extremities  Post vital signs: Reviewed and stable  Last Vitals:  Vitals Value Taken Time  BP 151/88 05/15/19 1519  Temp    Pulse 77 05/15/19 1525  Resp 18 05/15/19 1525  SpO2 100 % 05/15/19 1525  Vitals shown include unvalidated device data.  Last Pain:  Vitals:   05/15/19 0809  TempSrc:   PainSc: 8       Patients Stated Pain Goal: 3 (24/46/28 [id number])  Complications: No apparent anesthesia complications
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
## 2019-05-15 NOTE — Interval H&P Note (Signed)
History and Physical Interval Note:  05/15/2019 9:18 AM  Maurice Bell  has presented today for surgery, with the diagnosis of CERVICAL STENOSIS POST LEVEL CERVICAL FUSION.  The various methods of treatment have been discussed with the patient and family. After consideration of risks, benefits and other options for treatment, the patient has consented to  Procedure(s): REMOVAL OF C3-C7 PLATE, REVISION C3-4 FUSION WITH PARTIAL CORPECTOMY AT C3 AND REPLATING (N/A) POSTERIOR CERVICAL FUSION/FORAMINOTOMY C3-7 WITH LATERAL MASS INSTRUMENTATION (N/A) as a surgical intervention.  The patient's history has been reviewed, patient examined, no change in status, stable for surgery.  I have reviewed the patient's chart and labs.  Questions were answered to the patient's satisfaction.     Marybelle Killings

## 2019-05-15 NOTE — Anesthesia Procedure Notes (Signed)
Arterial Line Insertion Start/End7/13/2020 8:45 AM, 05/15/2019 9:00 AM Performed by: Leonor Liv, CRNA, CRNA  Patient location: Pre-op. Preanesthetic checklist: patient identified, IV checked, site marked, risks and benefits discussed, surgical consent, monitors and equipment checked, pre-op evaluation, timeout performed and anesthesia consent Lidocaine 1% used for infiltration Left, radial was placed Catheter size: 20 G Hand hygiene performed  and maximum sterile barriers used  Allen's test indicative of satisfactory collateral circulation Attempts: 1 Procedure performed without using ultrasound guided technique. Following insertion, dressing applied and Biopatch. Patient tolerated the procedure well with no immediate complications.

## 2019-05-16 LAB — POCT I-STAT 7, (LYTES, BLD GAS, ICA,H+H)
Acid-base deficit: 1 mmol/L (ref 0.0–2.0)
Bicarbonate: 22.9 mmol/L (ref 20.0–28.0)
Bicarbonate: 23.4 mmol/L (ref 20.0–28.0)
Calcium, Ion: 1.14 mmol/L — ABNORMAL LOW (ref 1.15–1.40)
Calcium, Ion: 1.14 mmol/L — ABNORMAL LOW (ref 1.15–1.40)
HCT: 25 % — ABNORMAL LOW (ref 39.0–52.0)
HCT: 22 % — ABNORMAL LOW (ref 39.0–52.0)
Hemoglobin: 8.5 g/dL — ABNORMAL LOW (ref 13.0–17.0)
Hemoglobin: 7.5 g/dL — ABNORMAL LOW (ref 13.0–17.0)
O2 Saturation: 100 %
O2 Saturation: 100 %
Patient temperature: 35.4
Potassium: 4.2 mmol/L (ref 3.5–5.1)
Potassium: 4.5 mmol/L (ref 3.5–5.1)
Sodium: 136 mmol/L (ref 135–145)
Sodium: 136 mmol/L (ref 135–145)
TCO2: 24 mmol/L (ref 22–32)
TCO2: 24 mmol/L (ref 22–32)
pCO2 arterial: 30.5 mmHg — ABNORMAL LOW (ref 32.0–48.0)
pCO2 arterial: 30.5 mmHg — ABNORMAL LOW (ref 32.0–48.0)
pH, Arterial: 7.478 — ABNORMAL HIGH (ref 7.350–7.450)
pH, Arterial: 7.493 — ABNORMAL HIGH (ref 7.350–7.450)
pO2, Arterial: 273 mmHg — ABNORMAL HIGH (ref 83.0–108.0)
pO2, Arterial: 267 mmHg — ABNORMAL HIGH (ref 83.0–108.0)

## 2019-05-16 LAB — BASIC METABOLIC PANEL
Anion gap: 16 — ABNORMAL HIGH (ref 5–15)
BUN: 45 mg/dL — ABNORMAL HIGH (ref 6–20)
CO2: 21 mmol/L — ABNORMAL LOW (ref 22–32)
Calcium: 8.9 mg/dL (ref 8.9–10.3)
Chloride: 101 mmol/L (ref 98–111)
Creatinine, Ser: 11.16 mg/dL — ABNORMAL HIGH (ref 0.61–1.24)
GFR calc Af Amer: 5 mL/min — ABNORMAL LOW (ref >60)
GFR calc non Af Amer: 4 mL/min — ABNORMAL LOW (ref >60)
Glucose, Bld: 96 mg/dL (ref 70–99)
Potassium: 4.9 mmol/L (ref 3.5–5.1)
Sodium: 138 mmol/L (ref 135–145)

## 2019-05-16 LAB — POCT I-STAT 4, (NA,K, GLUC, HGB,HCT)
Glucose, Bld: 119 mg/dL — ABNORMAL HIGH (ref 70–99)
HCT: 25 % — ABNORMAL LOW (ref 39.0–52.0)
Hemoglobin: 8.5 g/dL — ABNORMAL LOW (ref 13.0–17.0)
Potassium: 4.7 mmol/L (ref 3.5–5.1)
Sodium: 135 mmol/L (ref 135–145)

## 2019-05-16 LAB — CBC
HCT: 21.9 % — ABNORMAL LOW (ref 39.0–52.0)
Hemoglobin: 7.1 g/dL — ABNORMAL LOW (ref 13.0–17.0)
MCH: 35.7 pg — ABNORMAL HIGH (ref 26.0–34.0)
MCHC: 32.4 g/dL (ref 30.0–36.0)
MCV: 110.1 fL — ABNORMAL HIGH (ref 80.0–100.0)
Platelets: 149 10*3/uL — ABNORMAL LOW (ref 150–400)
RBC: 1.99 MIL/uL — ABNORMAL LOW (ref 4.22–5.81)
RDW: 16.6 % — ABNORMAL HIGH (ref 11.5–15.5)
WBC: 6.6 10*3/uL (ref 4.0–10.5)
nRBC: 0 % (ref 0.0–0.2)

## 2019-05-16 MED ORDER — SODIUM CHLORIDE 0.9 % IV SOLN: 100.0000 mL | INTRAVENOUS | Status: DC | PRN

## 2019-05-16 MED ORDER — ALTEPLASE 2 MG IJ SOLR: 2.0000 mg | Freq: Once | INTRAMUSCULAR | Status: DC | PRN

## 2019-05-16 MED ORDER — LIDOCAINE HCL (PF) 1 % IJ SOLN: 5.0000 mL | INTRAMUSCULAR | Status: DC | PRN

## 2019-05-16 MED ORDER — DARBEPOETIN ALFA 200 MCG/0.4ML IJ SOSY
200.0000 ug | PREFILLED_SYRINGE | INTRAMUSCULAR | Status: DC
  Administered 2019-05-16: 200 ug via INTRAVENOUS
  Filled 2019-05-16: qty 0.4

## 2019-05-16 MED ORDER — ROCURONIUM BROMIDE 10 MG/ML (PF) SYRINGE
PREFILLED_SYRINGE | INTRAVENOUS | Status: AC
  Filled 2019-05-16: qty 10

## 2019-05-16 MED ORDER — CHLORHEXIDINE GLUCONATE CLOTH 2 % EX PADS
6.0000 | MEDICATED_PAD | Freq: Every day | CUTANEOUS | Status: DC
  Administered 2019-05-17 – 2019-05-22 (×4): 6 via TOPICAL

## 2019-05-16 MED ORDER — PENTAFLUOROPROP-TETRAFLUOROETH EX AERO: 1.0000 "application " | INHALATION_SPRAY | CUTANEOUS | Status: DC | PRN

## 2019-05-16 MED ORDER — SUCCINYLCHOLINE CHLORIDE 200 MG/10ML IV SOSY
PREFILLED_SYRINGE | INTRAVENOUS | Status: AC
  Filled 2019-05-16: qty 10

## 2019-05-16 MED ORDER — CALCITRIOL 0.5 MCG PO CAPS
ORAL_CAPSULE | ORAL | Status: AC
  Administered 2019-05-16: 23:00:00
  Filled 2019-05-16: qty 1

## 2019-05-16 MED ORDER — HEPARIN SODIUM (PORCINE) 1000 UNIT/ML DIALYSIS
1000.0000 [IU] | INTRAMUSCULAR | Status: DC | PRN
  Administered 2019-05-16: 1000 [IU] via INTRAVENOUS_CENTRAL
  Filled 2019-05-16: qty 1

## 2019-05-16 MED ORDER — LIDOCAINE 2% (20 MG/ML) 5 ML SYRINGE
INTRAMUSCULAR | Status: AC
  Filled 2019-05-16: qty 5

## 2019-05-16 MED ORDER — HEPARIN SODIUM (PORCINE) 1000 UNIT/ML IJ SOLN
INTRAMUSCULAR | Status: AC
  Administered 2019-05-16: 23:00:00
  Filled 2019-05-16: qty 4

## 2019-05-16 MED ORDER — MORPHINE SULFATE (PF) 2 MG/ML IV SOLN
INTRAVENOUS | Status: AC
  Filled 2019-05-16: qty 1

## 2019-05-16 MED ORDER — LIDOCAINE-PRILOCAINE 2.5-2.5 % EX CREA: 1.0000 "application " | TOPICAL_CREAM | CUTANEOUS | Status: DC | PRN

## 2019-05-16 MED ORDER — DEXAMETHASONE SODIUM PHOSPHATE 10 MG/ML IJ SOLN
INTRAMUSCULAR | Status: AC
  Filled 2019-05-16: qty 1

## 2019-05-16 MED ORDER — ONDANSETRON HCL 4 MG/2ML IJ SOLN
INTRAMUSCULAR | Status: AC
  Filled 2019-05-16: qty 2

## 2019-05-16 MED ORDER — DARBEPOETIN ALFA 200 MCG/0.4ML IJ SOSY
PREFILLED_SYRINGE | INTRAMUSCULAR | Status: AC
  Filled 2019-05-16: qty 0.4

## 2019-05-16 MED ORDER — CALCITRIOL 0.5 MCG PO CAPS
0.5000 ug | ORAL_CAPSULE | ORAL | Status: DC
  Administered 2019-05-18: 16:00:00 0.5 ug via ORAL
  Filled 2019-05-16 (×3): qty 1

## 2019-05-16 NOTE — Progress Notes (Signed)
   Subjective: 1 Day Post-Op Procedure(s) (LRB): REMOVAL OF C3-C7 PLATE, REVISION C3-4 FUSION WITH PARTIAL CORPECTOMY AT C3 AND REPLATING (N/A) POSTERIOR CERVICAL FUSION/FORAMINOTOMY C3-7 WITH LATERAL MASS INSTRUMENTATION (N/A) Patient reports pain as mild and moderate.   Posterior cervical pain worse.  Objective: Vital signs in last 24 hours: Temp:  [97 F (36.1 C)-98.6 F (37 C)] 97.7 F (36.5 C) (07/14 0400) Pulse Rate:  [69-86] 81 (07/13 2102) Resp:  [11-18] 11 (07/13 1820) BP: (96-171)/(69-102) 96/70 (07/14 0400) SpO2:  [98 %-100 %] 100 % (07/13 1820) Arterial Line BP: (166-196)/(71-80) 196/80 (07/13 1535) Weight:  [63.5 kg] 63.5 kg (07/13 0809)  Intake/Output from previous day: 07/13 0701 - 07/14 0700 In: 1605 [I.V.:1090; Blood:315; IV Piggyback:200] Out: 750 [Blood:750] Intake/Output this shift: No intake/output data recorded.  Recent Labs    05/15/19 0832  HGB 8.5*   Recent Labs    05/15/19 0832  WBC 4.5  RBC 2.39*  HCT 27.4*  PLT 189   Recent Labs    05/15/19 0832  NA 136  K 4.0  CL 96*  CO2 23  BUN 30*  CREATININE 9.32*  GLUCOSE 89  CALCIUM 9.5   No results for input(s): LABPT, INR in the last 72 hours.  bilateral grip good. biceps triceps intact Dg Cervical Spine 2-3 Views  Result Date: 05/15/2019 CLINICAL DATA:  Removal of C3-7 plate, revision of C3-4 fusion. EXAM: CERVICAL SPINE - 2-3 VIEW; DG C-ARM 61-120 MIN FLUOROSCOPY TIME:  1 minutes 6 seconds. COMPARISON:  Fluoroscopic images of May 03, 2019. FINDINGS: Three intraoperative fluoroscopic images of the cervical spine were obtained. These images demonstrate patient be status post surgical anterior fusion extending from C3-C7. Interval posterior fusion of the same levels is noted. Grossly good alignment of vertebral bodies is noted. IMPRESSION: Fluoroscopic guidance provided during cervical surgical fusion. Electronically Signed   By: Marijo Conception M.D.   On: 05/15/2019 16:20   Dg C-arm 1-60  Min  Result Date: 05/15/2019 CLINICAL DATA:  Removal of C3-7 plate, revision of C3-4 fusion. EXAM: CERVICAL SPINE - 2-3 VIEW; DG C-ARM 61-120 MIN FLUOROSCOPY TIME:  1 minutes 6 seconds. COMPARISON:  Fluoroscopic images of May 03, 2019. FINDINGS: Three intraoperative fluoroscopic images of the cervical spine were obtained. These images demonstrate patient be status post surgical anterior fusion extending from C3-C7. Interval posterior fusion of the same levels is noted. Grossly good alignment of vertebral bodies is noted. IMPRESSION: Fluoroscopic guidance provided during cervical surgical fusion. Electronically Signed   By: Marijo Conception M.D.   On: 05/15/2019 16:20    Assessment/Plan: 1 Day Post-Op Procedure(s) (LRB): REMOVAL OF C3-C7 PLATE, REVISION C3-4 FUSION WITH PARTIAL CORPECTOMY AT C3 AND REPLATING (N/A) POSTERIOR CERVICAL FUSION/FORAMINOTOMY C3-7 WITH LATERAL MASS INSTRUMENTATION (N/A) Up with therapy, HV removed from anterior neck.   Marybelle Killings 05/16/2019, 7:54 AM

## 2019-05-16 NOTE — Consult Note (Signed)
   [facility] CM Inpatient Consult   05/16/2019  Maurice Bell [birth year] [id number]    Patient showing with [facility] banner. Patient checked for high risk score of unplanned readmission, with a 30-day readmission, 3 hospitalizations and 3 ED visits in the past 6 months; as a benefit under [facility] plan.    However, patient is not currently a beneficiary of the attributed [place] in the [employer].   This patient is Not eligible for [facility] Care Management Services.   Reason: Not a beneficiary currently attributed to one of the [place].  Membership roster was used to verify non-eligible status. Patient also has no primary care provider (PCP) listed in Epic.   For questions, please contact:  Edwena Felty A. Hasina Kreager, BSN, RN-BC [facility] Liaison Cell: [phone]
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
## 2019-05-16 NOTE — Anesthesia Postprocedure Evaluation (Signed)
Anesthesia Post Note  Patient: Maurice Bell  Procedure(s) Performed: REMOVAL OF C3-C7 PLATE, REVISION C3-4 FUSION WITH PARTIAL CORPECTOMY AT C3 AND REPLATING (N/A Neck) POSTERIOR CERVICAL FUSION/FORAMINOTOMY C3-7 WITH LATERAL MASS INSTRUMENTATION (N/A Neck)     Patient location during evaluation: PACU Anesthesia Type: General Level of consciousness: awake and alert Pain management: pain level controlled Vital Signs Assessment: post-procedure vital signs reviewed and stable Respiratory status: spontaneous breathing, nonlabored ventilation, respiratory function stable and patient connected to nasal cannula oxygen Cardiovascular status: blood pressure returned to baseline and stable Postop Assessment: no apparent nausea or vomiting Anesthetic complications: no    Last Vitals:  Vitals:   05/16/19 0835 05/16/19 0836  BP: 126/87   Pulse:  73  Resp:    Temp:    SpO2:      Last Pain:  Vitals:   05/16/19 0533  TempSrc:   PainSc: Asleep                 Janat Tabbert L Hannibal Skalla

## 2019-05-16 NOTE — Progress Notes (Signed)
Patient given  1.0 mg morphine IV, Removed 2.mg and wasted with RN Marlene Bast who does not have access to pixis.
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
## 2019-05-17 LAB — CBC
HCT: 20.9 % — ABNORMAL LOW (ref 39.0–52.0)
Hemoglobin: 7 g/dL — ABNORMAL LOW (ref 13.0–17.0)
MCH: 35.7 pg — ABNORMAL HIGH (ref 26.0–34.0)
MCHC: 33.5 g/dL (ref 30.0–36.0)
MCV: 106.6 fL — ABNORMAL HIGH (ref 80.0–100.0)
Platelets: 135 10*3/uL — ABNORMAL LOW (ref 150–400)
RBC: 1.96 MIL/uL — ABNORMAL LOW (ref 4.22–5.81)
RDW: 16.3 % — ABNORMAL HIGH (ref 11.5–15.5)
WBC: 6.3 10*3/uL (ref 4.0–10.5)
nRBC: 0 % (ref 0.0–0.2)

## 2019-05-17 LAB — HEPATITIS B SURFACE ANTIGEN: Hepatitis B Surface Ag: NEGATIVE

## 2019-05-17 MED ORDER — HYDRALAZINE HCL 25 MG PO TABS
25.0000 mg | ORAL_TABLET | Freq: Two times a day (BID) | ORAL | Status: DC
  Administered 2019-05-17 – 2019-05-22 (×7): 25 mg via ORAL
  Filled 2019-05-17 (×7): qty 1

## 2019-05-17 MED ORDER — CHLORHEXIDINE GLUCONATE CLOTH 2 % EX PADS
6.0000 | MEDICATED_PAD | Freq: Every day | CUTANEOUS | Status: DC
  Administered 2019-05-17 – 2019-05-22 (×4): 6 via TOPICAL

## 2019-05-17 NOTE — TOC Initial Note (Addendum)
Transition of Care [facility]) - Initial/Assessment Note    Patient Details  Name: Maurice Bell MRN: [id number] Date of Birth: May 06, 1956  Transition of Care [facility]) CM/SW Contact:    Vinie Sill, [place] Phone Number: 05/17/2019, 5:02 PM  Clinical Narrative:                  CSW received consult for discharge needs. CSW spoke with patient regarding PT recommendation of SNF placement at time of discharge. 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 He reports his brother and his son his there to help care for her while he is in the hospital. Patient states he wants to go to SNF and get ST rehab to improve his mobility and independence  before going home. Patient states never been to SNF and had no preference. CSW explained the SNF process and answered all questions.  Patient requested to have his mother contact information removed and to add his brother Xhaiden Coombs contact [phone] added.   Patient reports he receives dialysis at [facility], T/TH/Sat at 11:30.  Patient is realistic regarding therapy needs and expressed being hopeful for SNF placement. Patient expressed understanding of CSW role and discharge process as well as medical condition. No questions/concerns about plan or treatment at this time.  [name], MSW, [facility] Clinical Social Worker [phone]   Expected Discharge Plan: Skilled Nursing Facility Barriers to Discharge: Continued Medical Work up   Patient Goals and CMS Choice Patient states their goals for this hospitalization and ongoing recovery are:: get well so I can get back home      Expected Discharge Plan and Services Expected Discharge Plan: [place] In-house Referral: Clinical Social Work                                            Prior Living Arrangements/Services   Lives with:: Self, Other (Comment)(mother) Patient language and need for interpreter reviewed:: Yes         Need for Family Participation in Patient Care: Yes (Comment) Care giver support system in place?: Yes (comment)   Criminal Activity/Legal Involvement Pertinent to Current Situation/Hospitalization: No - Comment as needed  Activities of Daily Living Home Assistive Devices/Equipment: Eyeglasses, Hand-held shower hose, Walker (specify type) ADL Screening (condition at time of admission) Patient's cognitive ability adequate to safely complete daily activities?: Yes Is the patient deaf or have difficulty hearing?: No Does the patient have difficulty seeing, even when wearing glasses/contacts?: No Does the patient have difficulty concentrating, remembering, or making decisions?: No Patient able to express need for assistance with ADLs?: Yes Does the patient have difficulty dressing or bathing?: Yes Independently performs ADLs?: No Communication: Independent Dressing (OT): Needs assistance Is this a change from baseline?: Change from baseline, expected to last <3days Grooming: Independent Feeding: Independent Bathing: Independent Is this a change from baseline?: Change from baseline, expected to last <3 days Toileting: Needs assistance Is this a change from baseline?: Change from baseline, expected to last >3days In/Out Bed: Needs assistance Is this a change from baseline?: Change from baseline, expected to last >3 days Walks in Home: Needs assistance Does the patient have difficulty walking or climbing stairs?: Yes Weakness of Legs: Both Weakness of Arms/Hands: Both  Permission Sought/Granted Permission sought to share information with : Facility Sport and exercise psychologist, Family Supports Permission granted to share information with :  Yes, Verbal Permission Granted  Share Information with NAME: Jasaiah Karwowski  and [place] granted to share info w AGENCY: SNFs  Permission granted to share info w Relationship: Gail/ Son and [place]/brother  Permission granted to  share info w Contact Information: [phone] and [phone]  Emotional Assessment Appearance:: Appears stated age Attitude/Demeanor/Rapport: Engaged Affect (typically observed): Accepting, Appropriate, Hopeful Orientation: : Oriented to Self, Oriented to Place, Oriented to  Time, Oriented to Situation Alcohol / Substance Use: Not Applicable Psych Involvement: No (comment)  Admission diagnosis:  CERVICAL STENOSIS POST LEVEL CERVICAL FUSION Patient Active Problem List   Diagnosis Date Noted  . Cervical stenosis of spine 05/15/2019  . Cervical spinal stenosis 05/03/2019  . Neural foraminal stenosis of cervical spine 03/06/2019  . Other secondary kyphosis, 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  . Injury to ligament of cervical spine 03/06/2019  . Spinal stenosis of cervical region 02/03/2019  . 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 Numbness of left hand 02/03/2019  . Pancytopenia ([place]) 11/27/2018  . GERD (gastroesophageal reflux disease) 11/27/2018  . Depression 11/27/2018  . Abnormal EKG 11/27/2018  . Other spondylosis with radiculopathy, cervical region 11/17/2018  . Malnutrition of moderate degree 11/04/2018  . Endotracheal tube present   . Acute metabolic encephalopathy 79/43/2761  . Hypertensive urgency 11/03/2018  . Hypoglycemia without diagnosis of diabetes mellitus 11/03/2018  . Anemia of chronic disease 11/03/2018  . Uremia 10/06/2018  . Hypertension   . Myoclonic jerking   . Hepatitis   . History of anemia due to CKD   . Movement disorder 09/07/2018  . Acute encephalopathy 09/07/2018  . Right corneal abrasion   . ESRD (end stage renal disease) on dialysis ([place])   . Hyperkalemia 07/08/2018  . Need for acute hemodialysis ([place]) 03/31/2018  . ESRD (end stage renal disease) ([place]) 03/31/2018  . HTN (hypertension) 03/31/2018   PCP:  Patient, No Pcp Per Pharmacy:   [id number] - [place], [place] [address] [place] [address] Phone: [phone] Fax: [phone]     Social Determinants of Health (SDOH) Interventions    Readmission Risk Interventions No flowsheet data found.

## 2019-05-17 NOTE — Progress Notes (Signed)
Physical Therapy Treatment Patient Details Name: Maurice Bell MRN: [id number] DOB: October 29, 1956 Today's Date: 05/17/2019    History of Present Illness 63 year old male long-term dialysis patient with renal osteodystrophy and rapidly progressing cervical kyphosis,spondylosis and stenosis. O 05/03/19 pt underwent a C3-7 usionwith allograft and plate. Patient return to the office had had increased pain in his back weakness in his legs and some increased pain in his neck.  C-spine x-ray showed recurrent kyphosis and stenosis at the C3 level with posterior vertebral body retropulsion causing stenosis. Patient then underwent removal of 4 level anterior cervical play and screws, and then repeat 4 level plating and then repositioned to prone and underwent of 4 level posteior fusion with lateral mass screws for additional fixation. MRI also revealed significant spondylosis and collapse at L5-S1.    PT Comments    Pt limited by continued cervical pain and LLE radicular pain with weightbearing. Pt requiring up to mod assist for ambulating 50 feet, more antalgic gait pattern with increased distance. Noted LLE proximal weakness and decreased left hand fine motor coordination. 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 Updated d/c plan to SNF.   Follow Up Recommendations  SNF     Equipment Recommendations  None recommended by PT    Recommendations for Other Services       Precautions / Restrictions Precautions Precautions: Fall;Cervical Precaution Booklet Issued: Yes (comment) Precaution Comments: verbally reviewed, provided written handout Required Braces or Orthoses: Cervical Brace Cervical Brace: Hard collar;At all times Restrictions Weight Bearing Restrictions: No    Mobility  Bed Mobility Overal bed mobility: Modified Independent                Transfers Overall transfer level: Needs assistance Equipment used:  None Transfers: Sit to/from Stand Sit to Stand: Min guard            Ambulation/Gait Ambulation/Gait assistance: Min guard;Mod assist Gait Distance (Feet): 50 Feet Assistive device: 1 person hand held assist Gait Pattern/deviations: Step-through pattern;Decreased weight shift to left;Antalgic Gait velocity: slow   General Gait Details: pt with decreased L LE WBing, pt with progressive pain limiting ambulation and requiring seated rest break 1/2 way through   Stairs             Wheelchair Mobility    Modified Rankin (Stroke Patients Only)       Balance Overall balance assessment: Needs assistance Sitting-balance support: Feet supported;No upper extremity supported Sitting balance-Leahy Scale: Good     Standing balance support: Single extremity supported Standing balance-Leahy Scale: Poor                              Cognition Arousal/Alertness: Awake/alert Behavior During Therapy: WFL for tasks assessed/performed Overall Cognitive Status: Within Functional Limits for tasks assessed                                        Exercises General Exercises - Lower Extremity Straight Leg Raises: 10 reps;Left;Supine    General Comments        Pertinent Vitals/Pain Pain Assessment: 0-10 Pain Score: 9  Pain Location: L LE radicular pain, neck pain Pain Descriptors / Indicators: Throbbing;Shooting Pain Intervention(s): Limited activity within patient's tolerance;Monitored during session    [place]  Prior Function            PT Goals (current goals can now be found in the care plan section) Acute Rehab PT Goals Patient Stated Goal: stop this L leg pain PT Goal Formulation: With patient Time For Goal Achievement: 05/30/19 Potential to Achieve Goals: Good Progress towards PT goals: Not progressing toward goals - comment(limited by pain)    Frequency    Min 5X/week      PT Plan Discharge  plan needs to be updated    Co-evaluation              AM-PAC PT "6 Clicks" Mobility   Outcome Measure  Help needed turning from your back to your side while in a flat bed without using bedrails?: None Help needed moving from lying on your back to sitting on the side of a flat bed without using bedrails?: None Help needed moving to and from a bed to a chair (including a wheelchair)?: A Little Help needed standing up from a chair using your arms (e.g., wheelchair or bedside chair)?: A Little Help needed to walk in hospital room?: A Little Help needed climbing 3-5 steps with a railing? : A Little 6 Click Score: 20    End of Session Equipment Utilized During Treatment: Gait belt;Cervical collar Activity Tolerance: Patient limited by pain Patient left: with call bell/phone within reach;in bed Nurse Communication: Mobility status PT Visit Diagnosis: Unsteadiness on feet (R26.81);Other abnormalities of gait and mobility (R26.89);Pain     Time: 5625-6389 PT Time Calculation (min) (ACUTE ONLY): 18 min  Charges:  $Therapeutic Activity: 8-22 mins                     Ellamae Sia, PT, DPT Acute Rehabilitation Services Pager [phone] Office [phone]    Willy Eddy 05/17/2019, 10:19 AM

## 2019-05-17 NOTE — Progress Notes (Signed)
Thank you for consult on MR. Kibler. Chart and therapy evaluation note reviewed. Agree with recommendations for outpatient PT. Will defer CIR consult.

## 2019-05-17 NOTE — Progress Notes (Signed)
Renal Navigator notified OP HD clinic/[facility] of patient's admission and negative COVID 19 test result in order to provide continuity of care.  Alphonzo Cruise, [place] Renal Navigator [phone]

## 2019-05-17 NOTE — NC FL2 (Addendum)
[place] MEDICAID FL2 LEVEL OF CARE SCREENING TOOL     IDENTIFICATION  [name] Birthdate: Apr 22, 1956 Sex: male Admission Date (Current Location): 05/15/2019  [facility] and [place] Number:  Herbalist and Address:  [facility] [place], [place] [address], [place], [place] [address]      Provider Number: [id number]  Attending Physician Name and Address:  Marybelle Killings, MD  Relative Name and Phone Number:  [name]; son; [phone]    Current Level of Care: Hospital Recommended Level of Care: [place] Prior Approval Number:    Date Approved/Denied:   PASRR Number:   [id number]   Discharge Plan: SNF    Current Diagnoses: Patient Active Problem List   Diagnosis Date Noted  . Cervical stenosis of spine 05/15/2019  . Cervical spinal stenosis 05/03/2019  . Neural foraminal stenosis of cervical spine 03/06/2019  . Other secondary kyphosis, 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  . Injury to ligament of cervical spine 03/06/2019  . Spinal stenosis of cervical region 02/03/2019  . 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 Numbness of left hand 02/03/2019  . Pancytopenia ([place]) 11/27/2018  . GERD (gastroesophageal reflux disease) 11/27/2018  . Depression 11/27/2018  . Abnormal EKG 11/27/2018  . Other spondylosis with radiculopathy, cervical region 11/17/2018  . Malnutrition of moderate degree 11/04/2018  . Endotracheal tube present   . Acute metabolic encephalopathy 31/51/7616  . Hypertensive urgency 11/03/2018  . Hypoglycemia without diagnosis of diabetes mellitus 11/03/2018  . Anemia of chronic disease 11/03/2018  . Uremia 10/06/2018  . Hypertension   . Myoclonic jerking   . Hepatitis   . History of anemia due to CKD   . Movement disorder 09/07/2018  . Acute encephalopathy 09/07/2018  . Right corneal abrasion   . ESRD (end stage renal disease) on dialysis ([place])   . Hyperkalemia 07/08/2018  . Need for acute  hemodialysis ([place]) 03/31/2018  . ESRD (end stage renal disease) ([place]) 03/31/2018  . HTN (hypertension) 03/31/2018    Orientation RESPIRATION BLADDER Height & Weight     Self, Time, Situation, Place  Normal Continent Weight: 136 lb 11 oz (62 kg) Height:  5\' 5"  (165.1 cm)  BEHAVIORAL SYMPTOMS/MOOD NEUROLOGICAL BOWEL NUTRITION STATUS      Continent Diet(see discharge summary)  AMBULATORY STATUS COMMUNICATION OF NEEDS Skin   Limited Assist Verbally Surgical wounds(surgical incision on neck with gauze (PRN dressing changes))                       Personal Care Assistance Level of Assistance  Bathing, Dressing, Feeding Bathing Assistance: Limited assistance Feeding assistance: Independent Dressing Assistance: Limited assistance     Functional Limitations Info  Sight, Speech, Hearing Sight Info: Adequate Hearing Info: Adequate Speech Info: Adequate    SPECIAL CARE FACTORS FREQUENCY  OT (By licensed OT), PT (By licensed PT)     PT Frequency: 5x week OT Frequency: 5x week            Contractures Contractures Info: Not present    Additional Factors Info  Code Status, Allergies Code Status Info: Full Code Allergies Info: Vicodin (Hydrocodone-acetaminophen)           Current Medications (05/17/2019):  This is the current hospital active medication list Current Facility-Administered Medications  Medication Dose Route Frequency Provider Last Rate Last Dose  . 0.9 %  sodium chloride infusion (Manually program via Guardrails IV Fluids)   Intravenous Once Freddrick March, MD      .  acetaminophen (TYLENOL) tablet 325-650 mg  325-650 mg Oral Q6H PRN Marybelle Killings, MD   500 mg at 05/16/19 0025  . amLODipine (NORVASC) tablet 10 mg  10 mg Oral Daily Marybelle Killings, MD   10 mg at 05/17/19 1203  . [START ON 05/18/2019] calcitRIOL (ROCALTROL) capsule 0.5 mcg  0.5 mcg Oral Q T,Th,Sa-HD Penninger, Ria Comment, [place]      . Chlorhexidine Gluconate Cloth 2 % PADS 6 each  6 each Topical  Q0600 Penninger, [place], [place]   6 each at 05/17/19 0603  . Chlorhexidine Gluconate Cloth 2 % PADS 6 each  6 each Topical Q0600 Corliss Parish, MD   6 each at 05/17/19 1211  . cloNIDine (CATAPRES) tablet 0.1 mg  0.1 mg Oral BID Marybelle Killings, MD   0.1 mg at 05/17/19 1204  . Darbepoetin Alfa (ARANESP) injection 200 mcg  200 mcg Intravenous Q Tue-HD Penninger, [place], PA   200 mcg at 05/16/19 2003  . docusate sodium (COLACE) capsule 100 mg  100 mg Oral BID Marybelle Killings, MD   100 mg at 05/17/19 1204  . hydrALAZINE (APRESOLINE) tablet 25 mg  25 mg Oral BID Corliss Parish, MD      . lactated ringers infusion   Intravenous Continuous Albertha Ghee, MD 10 mL/hr at 05/15/19 1843    . methocarbamol (ROBAXIN) tablet 500 mg  500 mg Oral Q8H PRN Marybelle Killings, MD   500 mg at 05/16/19 2303   Or  . methocarbamol (ROBAXIN) 500 mg in dextrose 5 % 50 mL IVPB  500 mg Intravenous Q6H PRN Marybelle Killings, MD      . metoCLOPramide (REGLAN) tablet 5-10 mg  5-10 mg Oral Q8H PRN Marybelle Killings, MD       Or  . metoCLOPramide (REGLAN) injection 5-10 mg  5-10 mg Intravenous Q8H PRN Marybelle Killings, MD      . metoprolol tartrate (LOPRESSOR) tablet 150 mg  150 mg Oral BID Marybelle Killings, MD   150 mg at 05/17/19 1203  . morphine 2 MG/ML injection 0.5-1 mg  0.5-1 mg Intravenous Q2H PRN Marybelle Killings, MD   1 mg at 05/17/19 5284  . multivitamin (RENA-VIT) tablet 1 tablet  1 tablet Oral QHS Marybelle Killings, MD   1 tablet at 05/16/19 2302  . Muscle Rub CREA   Topical PRN Marybelle Killings, MD      . ondansetron [facility]) tablet 4 mg  4 mg Oral Q6H PRN Marybelle Killings, MD       Or  . ondansetron [facility]) injection 4 mg  4 mg Intravenous Q6H PRN Marybelle Killings, MD      . oxyCODONE-acetaminophen (PERCOCET/ROXICET) 5-325 MG per tablet 1-2 tablet  1-2 tablet Oral Q6H PRN Marybelle Killings, MD   2 tablet at 05/17/19 1203  . polyethylene glycol (MIRALAX / GLYCOLAX) packet 17 g  17 g Oral Daily PRN Marybelle Killings, MD      . sevelamer carbonate  (RENVELA) tablet 1,600 mg  1,600 mg Oral PRN Marybelle Killings, MD      . sevelamer carbonate (RENVELA) tablet 3,200 mg  3,200 mg Oral TID WC Marybelle Killings, MD   1,600 mg at 05/17/19 0807  . sodium chloride flush (NS) 0.9 % injection 3 mL  3 mL Intravenous PRN Marybelle Killings, MD      . sodium zirconium cyclosilicate (LOKELMA) packet 10 g  10 g Oral Q Anell Barr  C, MD   10 g at 05/15/19 2047  . thiamine (VITAMIN B-1) tablet 100 mg  100 mg Oral Daily Marybelle Killings, MD   100 mg at 05/17/19 1204  . valACYclovir (VALTREX) tablet 500 mg  500 mg Oral Daily Marybelle Killings, MD   500 mg at 05/17/19 1203  . vitamin B-12 (CYANOCOBALAMIN) tablet 2,000 mcg  2,000 mcg Oral Daily Marybelle Killings, MD   2,000 mcg at 05/17/19 1203     Discharge Medications: Please see discharge summary for a list of discharge medications.  Relevant Imaging Results:  Relevant Lab Results:   Additional Information [phone]; dialysis TTS [place], [place]

## 2019-05-17 NOTE — Progress Notes (Signed)
Inpatient Rehabilitation-Admissions Coordinator   Please see note by PM&R PA, Reesa Chew, on 7/15. Pt is not a candidate for CIR as he is functioning at a supervision/Min G level on evaluation. Will sign off and communicate recommendations to CM/SW.   Jhonnie Garner, OTR/L  Rehab Admissions Coordinator  [phone] 05/17/2019 10:56 AM

## 2019-05-17 NOTE — Progress Notes (Signed)
Subjective:  HD late yesterday - removed 2000- tolerated well    Objective Vital signs in last 24 hours: Vitals:   05/16/19 2303 05/16/19 2339 05/17/19 0300 05/17/19 0815  BP: 100/62 104/65 112/73 120/70  Pulse:  79 76 79  Resp:   18   Temp:  98.7 F (37.1 C) 98.4 F (36.9 C) 98.2 F (36.8 C)  TempSrc:  Oral Oral Oral  SpO2:  97% 98% 100%  Weight:      Height:       Weight change: 0.496 kg  Intake/Output Summary (Last 24 hours) at 05/17/2019 1046 Last data filed at 05/16/2019 2200 Gross per 24 hour  Intake -  Output 2000 ml  Net -2000 ml   Dialysis Orders:  TTS - [facility]  4hrs, BFR 450, DFR 800,  [id number], 2K/ 2.5Ca  Access: L IJ TDC, RU AVF now matured, ready for use 7/14 Heparin 1800 unit bolus Mircera 100 mcg q2wks - last on 7/2 Calcitriol 0.5 mcg PO qHD  Assessment/Plan: 1.  Osteodystrophy w/ rapidly progressing cervical kyphosis, spondylosis and stenosis - s/p cervical discectomy/ fusion revision. Per Dr. Lorin Mercy.  Not sure what dispo plan is- he does not think he can go home   2.  ESRD -  On HD TTS via TDC. K 4.9. Orders written for HD tomorrow per regular schedule.  No Heparin.  Will go ahead and try to use one needle in AVF  3.  Hypertension/volume  - BP soft.  +crackles, does not appear grossly volume overloaded.  Titrate down volume as tolerated. BP very well controlled on norvasc 10, clonidine 0.1 BID and hydralazine 50 BID and lopressor.  I am going to dec hydralazine to 25 BID 4.  Anemia of CKD - Hgb 7.1, aranesp 200 weekly q Tuesday.  Follow. Transfuse as indicated.   5.  Secondary Hyperparathyroidism -  Ca in goal.  Phos elevated as OP. Continue rocaltrol and renvela 6.  Nutrition - Renal diet w/fluid restrictions, Vit.  Also takes lokelma on Mondays      [place]: Basic Metabolic Panel: Recent Labs  Lab 05/15/19 9937  05/15/19 1219 05/15/19 1349 05/16/19 1126  NA 136   < > 136 135 138  K 4.0   < > 4.5 4.7 4.9  CL 96*  --   --    --  101  CO2 23  --   --   --  21*  GLUCOSE 89  --   --  119* 96  BUN 30*  --   --   --  45*  CREATININE 9.32*  --   --   --  11.16*  CALCIUM 9.5  --   --   --  8.9   < > = values in this interval not displayed.   Liver Function Tests: No results for input(s): AST, ALT, ALKPHOS, BILITOT, PROT, ALBUMIN in the last 168 hours. No results for input(s): LIPASE, AMYLASE in the last 168 hours. No results for input(s): AMMONIA in the last 168 hours. CBC: Recent Labs  Lab 05/15/19 0832  05/15/19 1349 05/16/19 1126 05/17/19 0433  WBC 4.5  --   --  6.6 6.3  HGB 8.5*   < > 8.5* 7.1* 7.0*  HCT 27.4*   < > 25.0* 21.9* 20.9*  MCV 114.6*  --   --  110.1* 106.6*  PLT 189  --   --  149* 135*   < > = values in this interval  not displayed.   Cardiac Enzymes: No results for input(s): CKTOTAL, CKMB, CKMBINDEX, TROPONINI in the last 168 hours. CBG: No results for input(s): GLUCAP in the last 168 hours.  Iron Studies: No results for input(s): IRON, TIBC, TRANSFERRIN, FERRITIN in the last 72 hours. Studies/Results: Dg Cervical Spine 2-3 Views  Result Date: 05/15/2019 CLINICAL DATA:  Removal of C3-7 plate, revision of C3-4 fusion. EXAM: CERVICAL SPINE - 2-3 VIEW; DG C-ARM 61-120 MIN FLUOROSCOPY TIME:  1 minutes 6 seconds. COMPARISON:  Fluoroscopic images of May 03, 2019. FINDINGS: Three intraoperative fluoroscopic images of the cervical spine were obtained. These images demonstrate patient be status post surgical anterior fusion extending from C3-C7. Interval posterior fusion of the same levels is noted. Grossly good alignment of vertebral bodies is noted. IMPRESSION: Fluoroscopic guidance provided during cervical surgical fusion. Electronically Signed   By: Marijo Conception M.D.   On: 05/15/2019 16:20   Dg C-arm 1-60 Min  Result Date: 05/15/2019 CLINICAL DATA:  Removal of C3-7 plate, revision of C3-4 fusion. EXAM: CERVICAL SPINE - 2-3 VIEW; DG C-ARM 61-120 MIN FLUOROSCOPY TIME:  1 minutes 6 seconds.  COMPARISON:  Fluoroscopic images of May 03, 2019. FINDINGS: Three intraoperative fluoroscopic images of the cervical spine were obtained. These images demonstrate patient be status post surgical anterior fusion extending from C3-C7. Interval posterior fusion of the same levels is noted. Grossly good alignment of vertebral bodies is noted. IMPRESSION: Fluoroscopic guidance provided during cervical surgical fusion. Electronically Signed   By: Marijo Conception M.D.   On: 05/15/2019 16:20   Medications: Infusions: . lactated ringers 10 mL/hr at 05/15/19 1843  . methocarbamol (ROBAXIN) IV      Scheduled Medications: . sodium chloride   Intravenous Once  . amLODipine  10 mg Oral Daily  . [START ON 05/18/2019] calcitRIOL  0.5 mcg Oral Q T,Th,Sa-HD  . Chlorhexidine Gluconate Cloth  6 each Topical Q0600  . cloNIDine  0.1 mg Oral BID  . darbepoetin (ARANESP) injection - DIALYSIS  200 mcg Intravenous Q Tue-HD  . docusate sodium  100 mg Oral BID  . hydrALAZINE  50 mg Oral BID  . metoprolol tartrate  150 mg Oral BID  . multivitamin  1 tablet Oral QHS  . sevelamer carbonate  3,200 mg Oral TID WC  . sodium zirconium cyclosilicate  10 g Oral Q Mon  . thiamine  100 mg Oral Daily  . valACYclovir  500 mg Oral Daily  . cyanocobalamin  2,000 mcg Oral Daily    have reviewed scheduled and prn medications.  Physical Exam: General: NAD Heart: RRR Lungs: mostly clear Abdomen: soft, non tender Extremities: no edema Dialysis Access: TDC and right upper AVF    05/17/2019,10:46 AM  LOS: 2 days

## 2019-05-17 NOTE — Progress Notes (Signed)
   Subjective: 2 Days Post-Op Procedure(s) (LRB): REMOVAL OF C3-C7 PLATE, REVISION C3-4 FUSION WITH PARTIAL CORPECTOMY AT C3 AND REPLATING (N/A) POSTERIOR CERVICAL FUSION/FORAMINOTOMY C3-7 WITH LATERAL MASS INSTRUMENTATION (N/A) Patient reports pain as moderate.    Objective: Vital signs in last 24 hours: Temp:  [98.4 F (36.9 C)-98.7 F (37.1 C)] 98.4 F (36.9 C) (07/15 0300) Pulse Rate:  [69-85] 76 (07/15 0300) Resp:  [10-18] 18 (07/15 0300) BP: (90-126)/(56-87) 112/73 (07/15 0300) SpO2:  [97 %-100 %] 98 % (07/15 0300) Weight:  [62 kg-64 kg] 62 kg (07/14 2200)  Intake/Output from previous day: 07/14 0701 - 07/15 0700 In: 240 [P.O.:240] Out: 2000  Intake/Output this shift: No intake/output data recorded.  Recent Labs    05/15/19 1104 05/15/19 1219 05/15/19 1349 05/16/19 1126 05/17/19 0433  HGB 8.5* 7.5* 8.5* 7.1* 7.0*   Recent Labs    05/16/19 1126 05/17/19 0433  WBC 6.6 6.3  RBC 1.99* 1.96*  HCT 21.9* 20.9*  PLT 149* 135*   Recent Labs    05/15/19 0832  05/15/19 1349 05/16/19 1126  NA 136   < > 135 138  K 4.0   < > 4.7 4.9  CL 96*  --   --  101  CO2 23  --   --  21*  BUN 30*  --   --  45*  CREATININE 9.32*  --   --  11.16*  GLUCOSE 89  --  119* 96  CALCIUM 9.5  --   --  8.9   < > = values in this interval not displayed.   No results for input(s): LABPT, INR in the last 72 hours.  Ambulated 100 feet.  Some improved strength in upper extremities.  Some left anterior tib weakness. No results found.  Assessment/Plan: 2 Days Post-Op Procedure(s) (LRB): REMOVAL OF C3-C7 PLATE, REVISION C3-4 FUSION WITH PARTIAL CORPECTOMY AT C3 AND REPLATING (N/A) POSTERIOR CERVICAL FUSION/FORAMINOTOMY C3-7 WITH LATERAL MASS INSTRUMENTATION (N/A) Up with therapy, transfer to regular bed out of ICU.  Rehab consultation for inpatient rehab.  Marybelle Killings 05/17/2019, 8:07 AM

## 2019-05-17 NOTE — Evaluation (Signed)
Occupational Therapy Evaluation Patient Details Name: Maurice Bell MRN: [id number] DOB: 09-30-1956 Today's Date: 05/17/2019    History of Present Illness 63 year old male long-term dialysis patient with renal osteodystrophy and rapidly progressing cervical kyphosis,spondylosis and stenosis. O 05/03/19 pt underwent a C3-7 usionwith allograft and plate. Patient return to the office had had increased pain in his back weakness in his legs and some increased pain in his neck.  C-spine x-ray showed recurrent kyphosis and stenosis at the C3 level with posterior vertebral body retropulsion causing stenosis. Patient then underwent removal of 4 level anterior cervical play and screws, and then repeat 4 level plating and then repositioned to prone and underwent of 4 level posteior fusion with lateral mass screws for additional fixation. MRI also revealed significant spondylosis and collapse at L5-S1.   Clinical Impression   Patient is s/p C3-7 repeat 4 level plating and underwent 4 level posterior fusion with lateral mass screws for fixation surgery resulting in functional limitations due to the deficits listed below (see OT problem list). Pt currently requires min (A) for basic transfer with decr fine motor. Pt with pain and requesting meds from RN. Rn arriving at the end of session. 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  Patient will benefit from skilled OT acutely to increase independence and safety with ADLS to allow discharge SNF.     Follow Up Recommendations  SNF    Equipment Recommendations  Other (comment)(defer SNF but 3n1 if required to return home)    Recommendations for Other Services Other (comment)(casemanager)     Precautions / Restrictions Precautions Precautions: Fall;Cervical Precaution Booklet Issued: Yes (comment) Precaution Comments: verbally reviewed, provided written handout Required Braces or Orthoses: Cervical  Brace Cervical Brace: Hard collar;At all times Restrictions Weight Bearing Restrictions: No      Mobility Bed Mobility Overal bed mobility: Modified Independent             General bed mobility comments: in chair no arrival. reports  I can;t laugh flat  Transfers Overall transfer level: Needs assistance Equipment used: None Transfers: Sit to/from Stand Sit to Stand: Min guard         General transfer comment: pt states "make sure its locked " then attempting to sit<s.tand after ensuring rollator locked    Balance Overall balance assessment: Needs assistance Sitting-balance support: Feet supported;No upper extremity supported Sitting balance-Leahy Scale: Good     Standing balance support: Single extremity supported Standing balance-Leahy Scale: Poor Standing balance comment: reliant on BIL UE                           ADL either performed or assessed with clinical judgement   ADL Overall ADL's : Needs assistance/impaired Eating/Feeding: Set up   Grooming: Minimal assistance;Standing               Lower Body Dressing: Moderate assistance;Sit to/from stand   Toilet Transfer: Minimal Insurance claims handler Details (indicate cue type and reason): pt requires (A) for balance due to favoring R LE to prevent weight on LLE         Functional mobility during ADLs: Minimal assistance;Rolling walker General ADL Comments: pt laughing with therapist and then immediately with tears down face due to pain from laughing. pt states oh dont make me laugh it hurts so bad. pt laughing and crying at the same time with a positive attitude attempting all task asked  of him.  pt educated proper fit of Ccollar on arrival and max (A) to return demo don of brace. pt with pain with any L UE shoulder elevation.      Vision         Perception     Praxis      Pertinent Vitals/Pain Pain Assessment: 0-10 Pain Score: 9  Pain Location: L LE radicular pain, neck  pain Pain Descriptors / Indicators: Throbbing;Shooting Pain Intervention(s): RN gave pain meds during session;Repositioned     Hand Dominance Right   Extremity/Trunk Assessment Upper Extremity Assessment Upper Extremity Assessment: Generalized weakness;RUE deficits/detail;LUE deficits/detail RUE Deficits / Details: Grossly 4/5, strong hand grip RUE Sensation: decreased light touch RUE Coordination: decreased fine motor LUE Deficits / Details: Grossly 4/5, strong handgrip LUE Sensation: decreased light touch LUE Coordination: decreased fine motor(reports tingling at times in finger tips)   Lower Extremity Assessment Lower Extremity Assessment: Defer to PT evaluation LLE Deficits / Details: keeping weight off this leg during transfer due to pain   Cervical / Trunk Assessment Cervical / Trunk Assessment: Other exceptions Cervical / Trunk Exceptions: s/p surg   Communication Communication Communication: No difficulties   Cognition Arousal/Alertness: Awake/alert Behavior During Therapy: WFL for tasks assessed/performed Overall Cognitive Status: Within Functional Limits for tasks assessed                                     General Comments  ccollar noted to have drainage. recommend ortho tech to provide secondary pads for ccollar. pt with dry dressings under ccollar at this tiem    Exercises Exercises: General Lower Extremity General Exercises - Lower Extremity Straight Leg Raises: 10 reps;Left;Supine   Shoulder Instructions      Home Living Family/patient expects to be discharged to:: Skilled nursing facility                                 Additional Comments: Pt is caregiver for elderly mother in 88s with dementia. he has to cook and clean for mother. he reports needing to place her clothes out and watch her put on her clothes because she will put on two pairs of pants. Pt reports that brother and son are caring for her currently and if he  goes home he will be alone with her. Requesting therapy prior to home to help him get back to being her caregiver      Prior Functioning/Environment Level of Independence: Independent with assistive device(s)        Comments: has been using rollator, sponge bathed due to HD port in chest        OT Problem List: Decreased activity tolerance;Decreased coordination;Decreased knowledge of precautions;Decreased knowledge of use of DME or AE;Decreased strength;Pain;Impaired UE functional use      OT Treatment/Interventions: Self-care/ADL training;Therapeutic activities;Therapeutic exercise;DME and/or AE instruction;Manual therapy;Patient/family education;Balance training    OT Goals(Current goals can be found in the care plan section) Acute Rehab OT Goals Patient Stated Goal: to get better so i can take care of my mother OT Goal Formulation: With patient Time For Goal Achievement: 05/30/19 Potential to Achieve Goals: Good  OT Frequency: Min 2X/week   Barriers to D/C: Decreased caregiver support  elderly mother and caregiver for her       Co-evaluation  AM-PAC OT "6 Clicks" Daily Activity     Outcome Measure Help from another person eating meals?: A Little Help from another person taking care of personal grooming?: A Little Help from another person toileting, which includes using toliet, bedpan, or urinal?: A Little Help from another person bathing (including washing, rinsing, drying)?: A Little Help from another person to put on and taking off regular upper body clothing?: A Little Help from another person to put on and taking off regular lower body clothing?: A Lot 6 Click Score: 17   End of Session Equipment Utilized During Treatment: Gait belt;Rolling walker Nurse Communication: Mobility status;Precautions  Activity Tolerance: Patient tolerated treatment well Patient left: in chair;with call bell/phone within reach;with chair alarm set  OT Visit  Diagnosis: Unsteadiness on feet (R26.81);Muscle weakness (generalized) (M62.81)                Time: 1610-9604 OT Time Calculation (min): 24 min Charges:  OT General Charges $OT Visit: 1 Visit OT Evaluation $OT Eval Moderate Complexity: 1 Mod   Jeri Modena, OTR/L  Acute Rehabilitation Services Pager: [phone] Office: [phone] .   Jeri Modena 05/17/2019, 10:56 AM

## 2019-05-18 LAB — CBC
HCT: 22.2 % — ABNORMAL LOW (ref 39.0–52.0)
Hemoglobin: 7.2 g/dL — ABNORMAL LOW (ref 13.0–17.0)
MCH: 35.3 pg — ABNORMAL HIGH (ref 26.0–34.0)
MCHC: 32.4 g/dL (ref 30.0–36.0)
MCV: 108.8 fL — ABNORMAL HIGH (ref 80.0–100.0)
Platelets: 148 10*3/uL — ABNORMAL LOW (ref 150–400)
RBC: 2.04 MIL/uL — ABNORMAL LOW (ref 4.22–5.81)
RDW: 15.4 % (ref 11.5–15.5)
WBC: 5.9 10*3/uL (ref 4.0–10.5)
nRBC: 0 % (ref 0.0–0.2)

## 2019-05-18 LAB — HEPATITIS B SURFACE ANTIBODY,QUALITATIVE: Hep B S Ab: REACTIVE

## 2019-05-18 LAB — RENAL FUNCTION PANEL
Albumin: 2.8 g/dL — ABNORMAL LOW (ref 3.5–5.0)
Anion gap: 19 — ABNORMAL HIGH (ref 5–15)
BUN: 36 mg/dL — ABNORMAL HIGH (ref 6–20)
CO2: 23 mmol/L (ref 22–32)
Calcium: 9 mg/dL (ref 8.9–10.3)
Chloride: 93 mmol/L — ABNORMAL LOW (ref 98–111)
Creatinine, Ser: 9.34 mg/dL — ABNORMAL HIGH (ref 0.61–1.24)
GFR calc Af Amer: 6 mL/min — ABNORMAL LOW (ref >60)
GFR calc non Af Amer: 5 mL/min — ABNORMAL LOW (ref >60)
Glucose, Bld: 121 mg/dL — ABNORMAL HIGH (ref 70–99)
Phosphorus: 5.6 mg/dL — ABNORMAL HIGH (ref 2.5–4.6)
Potassium: 4.2 mmol/L (ref 3.5–5.1)
Sodium: 135 mmol/L (ref 135–145)

## 2019-05-18 LAB — HEPATITIS B CORE ANTIBODY, TOTAL: Hep B Core Total Ab: POSITIVE — AB

## 2019-05-18 MED ORDER — HEPARIN SODIUM (PORCINE) 1000 UNIT/ML IJ SOLN
INTRAMUSCULAR | Status: AC
  Administered 2019-05-18: 12:00:00
  Filled 2019-05-18: qty 4

## 2019-05-18 MED ORDER — OXYCODONE-ACETAMINOPHEN 5-325 MG PO TABS: 1.0000 | ORAL_TABLET | ORAL | 0 refills | Status: DC | PRN

## 2019-05-18 MED ORDER — OXYCODONE-ACETAMINOPHEN 5-325 MG PO TABS
ORAL_TABLET | ORAL | Status: AC
  Filled 2019-05-18: qty 2

## 2019-05-18 NOTE — Procedures (Signed)
Patient was seen on dialysis and the procedure was supervised.  BFR 400  Via TDC BP is  135/81.   Patient appears to be tolerating treatment well  Louis Meckel 05/18/2019

## 2019-05-18 NOTE — Progress Notes (Signed)
Hemodialysis completed without issue. Patient tolerated well. UF 2.5L. Goal met. Patient currently sleeping and is without complaints. Report called to primary RN. Patient left hd unit in stable condition.

## 2019-05-18 NOTE — Progress Notes (Signed)
Physical Therapy Treatment Patient Details Name: Waylan Busta MRN: [id number] DOB: Mar 18, 1956 Today's Date: 05/18/2019    History of Present Illness 63 year old male long-term dialysis patient with renal osteodystrophy and rapidly progressing cervical kyphosis,spondylosis and stenosis. O 05/03/19 pt underwent a C3-7 usionwith allograft and plate. Patient return to the office had had increased pain in his back weakness in his legs and some increased pain in his neck.  C-spine x-ray showed recurrent kyphosis and stenosis at the C3 level with posterior vertebral body retropulsion causing stenosis. Patient then underwent removal of 4 level anterior cervical play and screws, and then repeat 4 level plating and then repositioned to prone and underwent of 4 level posteior fusion with lateral mass screws for additional fixation. MRI also revealed significant spondylosis and collapse at L5-S1.    PT Comments    Pt progressing slowly towards physical therapy goals, reporting LLE "throbbing" radicular pain and "shooting" cervical pain. Session focused on continued gait training and strengthening. Pt ambulating 60 feet with Rollator and up to mod assist. Becomes increasingly antalgic with poor gait quality with increased distance. SNF remains appropriate.    Follow Up Recommendations  SNF     Equipment Recommendations  None recommended by PT    Recommendations for Other Services       Precautions / Restrictions Precautions Precautions: Fall;Cervical Precaution Booklet Issued: Yes (comment) Precaution Comments: verbally reviewed, provided written handout Required Braces or Orthoses: Cervical Brace Cervical Brace: Hard collar;At all times Restrictions Weight Bearing Restrictions: No    Mobility  Bed Mobility               General bed mobility comments: OOB in chair  Transfers Overall transfer level: Needs assistance Equipment used: None Transfers: Sit to/from Stand Sit to Stand:  Supervision            Ambulation/Gait Ambulation/Gait assistance: Min guard;Mod assist Gait Distance (Feet): 60 Feet Assistive device: 4-wheeled walker Gait Pattern/deviations: Step-through pattern;Decreased weight shift to left;Antalgic Gait velocity: slow   General Gait Details: Pt initially requiring min guard assist, good speed during first ~30 feet of ambulation. On the way back, pt with increasingly antalgic gait, Trendelenberg, L knee instability, requiring up to modA to maintain upright. Cues for decreased step length, controlling momentum   Stairs             Wheelchair Mobility    Modified Rankin (Stroke Patients Only)       Balance Overall balance assessment: Needs assistance Sitting-balance support: Feet supported;No upper extremity supported Sitting balance-Leahy Scale: Good     Standing balance support: Single extremity supported Standing balance-Leahy Scale: Poor                              Cognition Arousal/Alertness: Awake/alert Behavior During Therapy: WFL for tasks assessed/performed Overall Cognitive Status: Within Functional Limits for tasks assessed                                        Exercises General Exercises - Lower Extremity Long Arc Quad: 10 reps;Both;Seated Hip ABduction/ADduction: 10 reps;Both;Seated;Other (comment)(isometric) Hip Flexion/Marching: Both;15 reps;Seated Heel Raises: 10 reps;Both;Seated   Scapular retractions x 10     General Comments        Pertinent Vitals/Pain Pain Assessment: Faces Faces Pain Scale: Hurts even more Pain Location: Neck, LLE, R hip, left shoulder pain  Pain Descriptors / Indicators: Throbbing;Shooting;Other (Comment)("shocks") Pain Intervention(s): Monitored during session    Home Living                      Prior Function            PT Goals (current goals can now be found in the care plan section) Acute Rehab PT Goals Patient Stated  Goal: stop this L leg pain PT Goal Formulation: With patient Time For Goal Achievement: 05/30/19 Potential to Achieve Goals: Good Progress towards PT goals: Progressing toward goals    Frequency    Min 5X/week      PT Plan Current plan remains appropriate    Co-evaluation              AM-PAC PT "6 Clicks" Mobility   Outcome Measure  Help needed turning from your back to your side while in a flat bed without using bedrails?: None Help needed moving from lying on your back to sitting on the side of a flat bed without using bedrails?: None Help needed moving to and from a bed to a chair (including a wheelchair)?: A Little Help needed standing up from a chair using your arms (e.g., wheelchair or bedside chair)?: A Little Help needed to walk in hospital room?: A Little Help needed climbing 3-5 steps with a railing? : A Little 6 Click Score: 20    End of Session Equipment Utilized During Treatment: Gait belt;Cervical collar Activity Tolerance: Patient limited by pain Patient left: with call bell/phone within reach;in bed Nurse Communication: Mobility status PT Visit Diagnosis: Unsteadiness on feet (R26.81);Other abnormalities of gait and mobility (R26.89);Pain     Time: 3893-7342 PT Time Calculation (min) (ACUTE ONLY): 16 min  Charges:  $Gait Training: 8-22 mins                     Ellamae Sia, [place], DPT Acute Rehabilitation Services Pager [phone] Office [phone]    Willy Eddy 05/18/2019, 4:58 PM

## 2019-05-18 NOTE — Progress Notes (Signed)
Subjective:  Seen on HD--- no new complaints- d/c summary in chart- going to SNF- not sure when.  Attempted to stick AVF today - not successful    Objective Vital signs in last 24 hours: Vitals:   05/18/19 0800 05/18/19 0830 05/18/19 0855 05/18/19 0900  BP: 137/79 138/79 127/78 (!) 154/83  Pulse: 72 71 72 76  Resp: 20 16 15 16   Temp:      TempSrc:      SpO2:      Weight:      Height:       Weight change:   Intake/Output Summary (Last 24 hours) at 05/18/2019 0926 Last data filed at 05/17/2019 1238 Gross per 24 hour  Intake 240 ml  Output -  Net 240 ml   Dialysis Orders:  TTS - [facility]  4hrs, BFR 450, DFR 800,  EDW58kg, 2K/ 2.5Ca  Access: L IJ TDC, RU AVF now matured, ready for use 7/14 Heparin 1800 unit bolus Mircera 100 mcg q2wks - last on 7/2 Calcitriol 0.5 mcg PO qHD  Assessment/Plan: 1.  Osteodystrophy w/ rapidly progressing cervical kyphosis, spondylosis and stenosis - s/p cervical discectomy/ fusion revision. Per Dr. Lorin Mercy.  Going to SNF once that arranged 2.  ESRD -  On HD TTS via TDC.  HD today per regular schedule.  No Heparin. one needle in AVF not successful- will table for now  3.  Hypertension/volume  - BP soft.  +crackles, does not appear grossly volume overloaded.  Titrate down volume as tolerated. BP very well controlled on norvasc 10, clonidine 0.1 BID and hydralazine 50 BID and lopressor.  I  dec hydralazine to 25 BID 4.  Anemia of CKD - Hgb 7.1, aranesp 200 weekly q Tuesday.  Follow. Transfuse as indicated.  stable 5.  Secondary Hyperparathyroidism -  Ca in goal.  Phos elevated as OP. Continue rocaltrol and renvela- last phos 5.6  6.  Nutrition - Renal diet w/fluid restrictions, Vit.  Also takes lokelma on Mondays      [place]: Basic Metabolic Panel: Recent Labs  Lab 05/15/19 2119  05/15/19 1349 05/16/19 1126 05/18/19 0803  NA 136   < > 135 138 135  K 4.0   < > 4.7 4.9 4.2  CL 96*  --   --  101 93*  CO2 23  --   --  21* 23   GLUCOSE 89  --  119* 96 121*  BUN 30*  --   --  45* 36*  CREATININE 9.32*  --   --  11.16* 9.34*  CALCIUM 9.5  --   --  8.9 9.0  PHOS  --   --   --   --  5.6*   < > = values in this interval not displayed.   Liver Function Tests: Recent Labs  Lab 05/18/19 0803  ALBUMIN 2.8*   No results for input(s): LIPASE, AMYLASE in the last 168 hours. No results for input(s): AMMONIA in the last 168 hours. CBC: Recent Labs  Lab 05/15/19 0832  05/16/19 1126 05/17/19 0433 05/18/19 0849  WBC 4.5  --  6.6 6.3 5.9  HGB 8.5*   < > 7.1* 7.0* 7.2*  HCT 27.4*   < > 21.9* 20.9* 22.2*  MCV 114.6*  --  110.1* 106.6* 108.8*  PLT 189  --  149* 135* 148*   < > = values in this interval not displayed.   Cardiac Enzymes: No results for input(s): CKTOTAL, CKMB, CKMBINDEX,  TROPONINI in the last 168 hours. CBG: No results for input(s): GLUCAP in the last 168 hours.  Iron Studies: No results for input(s): IRON, TIBC, TRANSFERRIN, FERRITIN in the last 72 hours. Studies/Results: No results found. Medications: Infusions: . lactated ringers 10 mL/hr at 05/15/19 1843  . methocarbamol (ROBAXIN) IV      Scheduled Medications: . sodium chloride   Intravenous Once  . amLODipine  10 mg Oral Daily  . calcitRIOL  0.5 mcg Oral Q T,Th,Sa-HD  . Chlorhexidine Gluconate Cloth  6 each Topical Q0600  . Chlorhexidine Gluconate Cloth  6 each Topical Q0600  . cloNIDine  0.1 mg Oral BID  . darbepoetin (ARANESP) injection - DIALYSIS  200 mcg Intravenous Q Tue-HD  . docusate sodium  100 mg Oral BID  . hydrALAZINE  25 mg Oral BID  . metoprolol tartrate  150 mg Oral BID  . multivitamin  1 tablet Oral QHS  . oxyCODONE-acetaminophen      . sevelamer carbonate  3,200 mg Oral TID WC  . sodium zirconium cyclosilicate  10 g Oral Q Mon  . thiamine  100 mg Oral Daily  . valACYclovir  500 mg Oral Daily  . cyanocobalamin  2,000 mcg Oral Daily    have reviewed scheduled and prn medications.  Physical Exam: General:  NAD Heart: RRR Lungs: mostly clear Abdomen: soft, non tender Extremities: no edema Dialysis Access: TDC and right upper AVF    05/18/2019,9:26 AM  LOS: 3 days

## 2019-05-18 NOTE — Progress Notes (Signed)
PT Cancellation Note  Patient Details Name: Maurice Bell MRN: [id number] DOB: [birth year]   Cancelled Treatment:    Reason Eval/Treat Not Completed: Patient at procedure or test/unavailable (HD).  Ellamae Sia, PT, DPT Acute Rehabilitation Services Pager [phone] Office [phone]    Willy Eddy 05/18/2019, 7:41 AM

## 2019-05-18 NOTE — Progress Notes (Signed)
HD initiated via L Chest HD catheter. Attempted to cannulate avf x1 as ordered. Blood return noted. Patient stated it was too painful while trying to thread needle and requested needle be removed and to use dialysis catheter. States "maybe we can try again Saturday, we'll see." Will notify MD at rounds. Using dialysis catheter as ordered. Currently not complaining of pain. Expresses frustration with medical situation. Continue to monitor.

## 2019-05-18 NOTE — Discharge Summary (Addendum)
Physician Discharge Summary  Patient ID: Maurice Bell MRN: [id number] DOB/AGE: 61-Dec-1957 61 y.o.  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  Discharged Condition: Stable and improved.  Hospital Course: Patient had anterior cervical fusion 4 level due to renal osteodystrophy with progressive collapse multilevel with stenosis and foraminal stenosis.  Postoperatively when seen in the office he developed recurrent kyphosis and collapse at the C3 level necessitating repeat surgery anteriorly as well as posterior 4 level fusion from C3-C7.  He tolerated the procedure well had improvement in his neck pain but continues to have problems with back and left leg pain due to renal osteodystrophy with single level severe collapse in the lumbar spine labeled L5-S1 by MRI and L4-5 5 plain radiographs.  Transitional anatomy.  Postoperatively he was maintained in his Aspen collar he had some bleeding from the posterior cervical incision requiring dressing change.  He continued with in-hospital dialysis.  Pain was controlled with Percocet.  He ambulated with a walker but noted weakness after walking 100 feet and had to sit and rest.  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  Patient was not strong enough to resume care of his mother and will go to skilled facility for continued strengthening and rehabilitation.  Patient keeps his Aspen collar on at all times and will have dressing change when he turns see Dr. Lorin Mercy in 2 weeks.  Consults: nephrology                  Rehab consultation Significant Diagnostic Studies:   Treatments: Removal of 4 level anterior cervical plate,  revision allograft C3 corpectomy, corpectomy allograft and plate revision anteriorly.  Posterior 4 level lateral mass fusion with rods and screws.  Discharge Exam: Blood pressure (!) 144/86, pulse 75, temperature 98.1 F (36.7 C), temperature source Oral, resp. rate 20, height 5\' 5"  (1.651 m), weight 62 kg, SpO2 100 %. Physical exam: Patient is amatory with a walker still complains of pain in his left leg from severe foraminal stenosis at the L4-5 level.  Improvement in neck pain.  Good grip strength still has some weakness upper extremities diffuse.  Disposition: Patient stable at discharge.   Allergies as of 05/18/2019      Reactions   Vicodin [hydrocodone-acetaminophen] Itching      Medication List    TAKE these medications   amLODipine 10 MG tablet Commonly known as: NORVASC Take 1 tablet (10 mg total) by mouth at bedtime. What changed: when to take this   cloNIDine 0.1 MG tablet Commonly known as: CATAPRES Take 1 tablet (0.1 mg total) by mouth 2 (two) times daily. What changed: when to take this   cyanocobalamin 2000 MCG tablet Take 1 tablet (2,000 mcg total) by mouth daily.   hydrALAZINE 50 MG tablet Commonly known as: APRESOLINE Take 50 mg by mouth 2 (two) times a day.   Icy Hot 10-30 % Stck Apply 1 application topically as needed (joint pain).   Lokelma 10 g Pack packet Generic drug: sodium zirconium cyclosilicate Take 10 g by mouth every Monday.   metoprolol tartrate 50 MG tablet Commonly known as: LOPRESSOR Take 150 mg by mouth 2 (two) times daily.   multivitamin Tabs tablet  Take 1 tablet by mouth at bedtime.   oxyCODONE-acetaminophen 5-325 MG tablet Commonly known as: Percocet Take 1-2 tablets by mouth every 4 (four) hours as needed for severe pain. What changed: when to take this   sevelamer carbonate 800 MG tablet Commonly known as: RENVELA Take 4 tablets (3,200 mg total) by mouth 3 (three) times daily with meals. What changed:   when to take  this  additional instructions   thiamine 100 MG tablet Take 1 tablet (100 mg total) by mouth daily.   Tylenol 8 Hour Arthritis Pain 650 MG CR tablet Generic drug: acetaminophen Take 1,300 mg by mouth every 8 (eight) hours as needed for pain.   valACYclovir 500 MG tablet Commonly known as: VALTREX Take 500 mg by mouth daily.      addendum:  Patient continues daily therapy ambulation with walker and dialysis every other day.  Office follow up  2 wks  Signed: Marybelle Killings 05/18/2019, 7:34 AM

## 2019-05-18 NOTE — Progress Notes (Signed)
Currently in dialysis, discharge summary done since he will be going to SNF for more therapy. Rx on chart. Office to see me in 2 wks. Collar stays on.

## 2019-05-19 ENCOUNTER — Ambulatory Visit: Payer: Medicare HMO | Admitting: Orthopaedic Surgery

## 2019-05-19 ENCOUNTER — Encounter (HOSPITAL_COMMUNITY): Payer: Self-pay | Admitting: Orthopaedic Surgery

## 2019-05-19 LAB — TYPE AND SCREEN
ABO/RH(D): O POS
Antibody Screen: NEGATIVE
Unit division: 0
Unit division: 0

## 2019-05-19 LAB — BPAM RBC
Blood Product Expiration Date: 202008082359
Blood Product Expiration Date: 202008092359
ISSUE DATE / TIME: 202007131129
ISSUE DATE / TIME: 202007131129
Unit Type and Rh: 5100
Unit Type and Rh: 5100

## 2019-05-19 MED ORDER — CHLORHEXIDINE GLUCONATE CLOTH 2 % EX PADS
6.0000 | MEDICATED_PAD | Freq: Every day | CUTANEOUS | Status: DC
  Administered 2019-05-20 – 2019-05-22 (×3): 6 via TOPICAL

## 2019-05-19 NOTE — Care Management Important Message (Signed)
Important Message  Patient Details  Name: Maurice Bell MRN: [id number] Date of Birth: 01-08-1956   Medicare Important Message Given:  Yes     Memory Argue 05/19/2019, 3:55 PM

## 2019-05-19 NOTE — Progress Notes (Signed)
Subjective:  No new c/o's  - accepted at [facility] but waiting on insurance clearance   Objective Vital signs in last 24 hours: Vitals:   05/18/19 1547 05/18/19 2010 05/19/19 0447 05/19/19 0748  BP: 127/76 113/67 139/74 (!) 160/84  Pulse: 84 79  79  Resp: 16 18 19 18   Temp: 98.4 F (36.9 C) 98.4 F (36.9 C) 98.7 F (37.1 C) 98.2 F (36.8 C)  TempSrc: Oral Oral Oral Oral  SpO2: 100% 100% 100% 100%  Weight:      Height:       Weight change:   Intake/Output Summary (Last 24 hours) at 05/19/2019 0909 Last data filed at 05/19/2019 0800 Gross per 24 hour  Intake 480 ml  Output 2515 ml  Net -2035 ml   Dialysis Orders:  [facility]  4hrs, BFR 450, DFR 800,  EDW58kg, 2K/ 2.5Ca  Access: L IJ TDC, RU AVF now matured, ready for use 7/14 Heparin 1800 unit bolus Mircera 100 mcg q2wks - last on 7/2 Calcitriol 0.5 mcg PO qHD  Assessment/Plan: 1.  Osteodystrophy w/ rapidly progressing cervical kyphosis, spondylosis and stenosis - s/p cervical discectomy/ fusion revision. Per Dr. Lorin Mercy.  Going to SNF eventually  2.  ESRD -  On HD TTS via TDC.  HD tomorrow per regular schedule.  No Heparin. one needle in AVF not successful here- will table for now.  HD tomorrow here vs OP- will follow events of the day  3.  Hypertension/volume  - BP soft.  +crackles, does not appear grossly volume overloaded.  Titrate down volume as tolerated. BP very well controlled on norvasc 10, clonidine 0.1 BID and hydralazine 50 BID and lopressor.  I  dec hydralazine to 25 BID- was at [facility] post HD yest 4.  Anemia of CKD - Hgb 7.2, aranesp 200 weekly q Tuesday.  Follow. Transfuse as indicated.  stable 5.  Secondary Hyperparathyroidism -  Ca in goal.  Phos elevated as OP. Continue rocaltrol and renvela- last phos 5.6  6.  Nutrition - Renal diet w/fluid restrictions, Vit.  Also takes lokelma on Mondays      [place]: Basic Metabolic Panel: Recent Labs  Lab 05/15/19 2637  05/15/19 1349  05/16/19 1126 05/18/19 0803  NA 136   < > 135 138 135  K 4.0   < > 4.7 4.9 4.2  CL 96*  --   --  101 93*  CO2 23  --   --  21* 23  GLUCOSE 89  --  119* 96 121*  BUN 30*  --   --  45* 36*  CREATININE 9.32*  --   --  11.16* 9.34*  CALCIUM 9.5  --   --  8.9 9.0  PHOS  --   --   --   --  5.6*   < > = values in this interval not displayed.   Liver Function Tests: Recent Labs  Lab 05/18/19 0803  ALBUMIN 2.8*   No results for input(s): LIPASE, AMYLASE in the last 168 hours. No results for input(s): AMMONIA in the last 168 hours. CBC: Recent Labs  Lab 05/15/19 0832  05/16/19 1126 05/17/19 0433 05/18/19 0849  WBC 4.5  --  6.6 6.3 5.9  HGB 8.5*   < > 7.1* 7.0* 7.2*  HCT 27.4*   < > 21.9* 20.9* 22.2*  MCV 114.6*  --  110.1* 106.6* 108.8*  PLT 189  --  149* 135* 148*   < > =  values in this interval not displayed.   Cardiac Enzymes: No results for input(s): CKTOTAL, CKMB, CKMBINDEX, TROPONINI in the last 168 hours. CBG: No results for input(s): GLUCAP in the last 168 hours.  Iron Studies: No results for input(s): IRON, TIBC, TRANSFERRIN, FERRITIN in the last 72 hours. Studies/Results: No results found. Medications: Infusions: . lactated ringers 10 mL/hr at 05/15/19 1843  . methocarbamol (ROBAXIN) IV      Scheduled Medications: . sodium chloride   Intravenous Once  . amLODipine  10 mg Oral Daily  . calcitRIOL  0.5 mcg Oral Q T,Th,Sa-HD  . Chlorhexidine Gluconate Cloth  6 each Topical Q0600  . Chlorhexidine Gluconate Cloth  6 each Topical Q0600  . cloNIDine  0.1 mg Oral BID  . darbepoetin (ARANESP) injection - DIALYSIS  200 mcg Intravenous Q Tue-HD  . docusate sodium  100 mg Oral BID  . hydrALAZINE  25 mg Oral BID  . metoprolol tartrate  150 mg Oral BID  . multivitamin  1 tablet Oral QHS  . sevelamer carbonate  3,200 mg Oral TID WC  . sodium zirconium cyclosilicate  10 g Oral Q Mon  . thiamine  100 mg Oral Daily  . valACYclovir  500 mg Oral Daily  . cyanocobalamin   2,000 mcg Oral Daily    have reviewed scheduled and prn medications.  Physical Exam: General: NAD Heart: RRR Lungs: mostly clear Abdomen: soft, non tender Extremities: no edema Dialysis Access: TDC and right upper AVF    05/19/2019,9:09 AM  LOS: 4 days

## 2019-05-19 NOTE — Progress Notes (Signed)
   Subjective: 4 Days Post-Op Procedure(s) (LRB): REMOVAL OF C3-C7 PLATE, REVISION C3-4 FUSION WITH PARTIAL CORPECTOMY AT C3 AND REPLATING (N/A) POSTERIOR CERVICAL FUSION/FORAMINOTOMY C3-7 WITH LATERAL MASS INSTRUMENTATION (N/A) Patient reports pain as moderate.    Objective: Vital signs in last 24 hours: Temp:  [98.2 F (36.8 C)-98.7 F (37.1 C)] 98.2 F (36.8 C) (07/17 0748) Pulse Rate:  [69-84] 79 (07/17 0748) Resp:  [12-19] 18 (07/17 0748) BP: (107-160)/(66-84) 160/84 (07/17 0748) SpO2:  [100 %] 100 % (07/17 0748) Weight:  [58.5 kg] 58.5 kg (07/16 1139)  Intake/Output from previous day: 07/16 0701 - 07/17 0700 In: 240 [P.O.:240] Out: 2515  Intake/Output this shift: Total I/O In: 240 [P.O.:240] Out: -   Recent Labs    05/16/19 1126 05/17/19 0433 05/18/19 0849  HGB 7.1* 7.0* 7.2*   Recent Labs    05/17/19 0433 05/18/19 0849  WBC 6.3 5.9  RBC 1.96* 2.04*  HCT 20.9* 22.2*  PLT 135* 148*   Recent Labs    05/16/19 1126 05/18/19 0803  NA 138 135  K 4.9 4.2  CL 101 93*  CO2 21* 23  BUN 45* 36*  CREATININE 11.16* 9.34*  GLUCOSE 96 121*  CALCIUM 8.9 9.0   No results for input(s): LABPT, INR in the last 72 hours.  still with left leg weakness . No results found.  Assessment/Plan: 4 Days Post-Op Procedure(s) (LRB): REMOVAL OF C3-C7 PLATE, REVISION C3-4 FUSION WITH PARTIAL CORPECTOMY AT C3 AND REPLATING (N/A) POSTERIOR CERVICAL FUSION/FORAMINOTOMY C3-7 WITH LATERAL MASS INSTRUMENTATION (N/A) Discharge to SNF once approved. Bed available, insurance approval pending. Discharge summary done. Rx for pain on chart.   Maurice Bell 05/19/2019, 9:33 AM

## 2019-05-19 NOTE — Progress Notes (Signed)
Physical Therapy Treatment Patient Details Name: Devansh Riese MRN: [id number] DOB: 1956-05-03 Today's Date: 05/19/2019    History of Present Illness 63 year old male long-term dialysis patient with renal osteodystrophy and rapidly progressing cervical kyphosis,spondylosis and stenosis. O 05/03/19 pt underwent a C3-7 usionwith allograft and plate. Patient return to the office had had increased pain in his back weakness in his legs and some increased pain in his neck.  C-spine x-ray showed recurrent kyphosis and stenosis at the C3 level with posterior vertebral body retropulsion causing stenosis. Patient then underwent removal of 4 level anterior cervical play and screws, and then repeat 4 level plating and then repositioned to prone and underwent of 4 level posteior fusion with lateral mass screws for additional fixation. MRI also revealed significant spondylosis and collapse at L5-S1.    PT Comments    Pt reporting neck "heaviness," today, stating, "it feels like there is a 10 pound weight on me." Remains motivated and eager to participate in therapy. Session with continued focused on lower extremity strengthening (particularly proximal strengthening) and gait training. Pt maintaining mobility, ambulating 60 feet with walker and min guard assist. Slower gait with rolling walker but overall more stable with use. Continue to recommend SNF for ongoing Physical Therapy.       Follow Up Recommendations  SNF     Equipment Recommendations  None recommended by PT    Recommendations for Other Services       Precautions / Restrictions Precautions Precautions: Fall;Cervical Precaution Booklet Issued: Yes (comment) Precaution Comments: verbally reviewed, provided written handout Required Braces or Orthoses: Cervical Brace Cervical Brace: Hard collar;At all times Restrictions Weight Bearing Restrictions: No    Mobility  Bed Mobility               General bed mobility comments: OOB in  chair  Transfers Overall transfer level: Needs assistance Equipment used: None Transfers: Sit to/from Stand Sit to Stand: Supervision            Ambulation/Gait Ambulation/Gait assistance: Min guard Gait Distance (Feet): 60 Feet Assistive device: Rolling walker (2 wheeled) Gait Pattern/deviations: Step-through pattern;Decreased weight shift to left;Antalgic;Step-to pattern;Narrow base of support Gait velocity: slow   General Gait Details: Pt with improved balance with use of walker, min guard assist for balance. Initially step through pattern but then regressing to step to pattern. Cues for wider BOS   Stairs             Wheelchair Mobility    Modified Rankin (Stroke Patients Only)       Balance Overall balance assessment: Needs assistance Sitting-balance support: Feet supported;No upper extremity supported Sitting balance-Leahy Scale: Good     Standing balance support: Single extremity supported Standing balance-Leahy Scale: Poor                              Cognition Arousal/Alertness: Awake/alert Behavior During Therapy: WFL for tasks assessed/performed Overall Cognitive Status: Within Functional Limits for tasks assessed                                        Exercises General Exercises - Lower Extremity Long Arc Quad: 10 reps;Both;Seated Hip ABduction/ADduction: 10 reps;Both;Seated;Other (comment)(isometric) Hip Flexion/Marching: Both;Seated;10 reps    General Comments        Pertinent Vitals/Pain Pain Assessment: Faces Faces Pain Scale: Hurts even more Pain Location: Neck,  LLE Pain Descriptors / Indicators: Throbbing;Heaviness Pain Intervention(s): Limited activity within patient's tolerance;Monitored during session;Premedicated before session    Home Living                      Prior Function            PT Goals (current goals can now be found in the care plan section) Acute Rehab PT  Goals Patient Stated Goal: stop this L leg pain PT Goal Formulation: With patient Time For Goal Achievement: 05/30/19 Potential to Achieve Goals: Good Progress towards PT goals: Progressing toward goals    Frequency    Min 5X/week      PT Plan Current plan remains appropriate    Co-evaluation              AM-PAC PT "6 Clicks" Mobility   Outcome Measure  Help needed turning from your back to your side while in a flat bed without using bedrails?: None Help needed moving from lying on your back to sitting on the side of a flat bed without using bedrails?: None Help needed moving to and from a bed to a chair (including a wheelchair)?: A Little Help needed standing up from a chair using your arms (e.g., wheelchair or bedside chair)?: A Little Help needed to walk in hospital room?: A Little Help needed climbing 3-5 steps with a railing? : A Little 6 Click Score: 20    End of Session Equipment Utilized During Treatment: Gait belt;Cervical collar Activity Tolerance: Patient tolerated treatment well Patient left: with call bell/phone within reach;in bed Nurse Communication: Mobility status PT Visit Diagnosis: Unsteadiness on feet (R26.81);Other abnormalities of gait and mobility (R26.89);Pain     Time: 0962-8366 PT Time Calculation (min) (ACUTE ONLY): 16 min  Charges:  $Gait Training: 8-22 mins                     Ellamae Sia, [place], DPT Acute Rehabilitation Services Pager [phone] Office [phone]    Willy Eddy 05/19/2019, 10:05 AM

## 2019-05-19 NOTE — TOC Progression Note (Signed)
Transition of Care [facility]) - Progression Note    Patient Details  Name: Maurice Bell MRN: [id number] Date of Birth: Dec 26, 1955  Transition of Care [facility]) CM/SW [place], [place] Phone Number: 05/19/2019, 8:39 AM  Clinical Narrative:     Patient has accepted bed offer at [facility].  Insurance authorization started today.  Insurance authorization pending.   CSW informed  CSW HD Coordinator, [name] patient will discharge to [facility] once he has Ship broker.   [name], MSW, [facility] Clinical Social Worker [phone]   Expected Discharge Plan: Skilled Nursing Facility Barriers to Discharge: Continued Medical Work up  Expected Discharge Plan and Services Expected Discharge Plan: [place] In-house Referral: Clinical Social Work                                             Social Determinants of Health (SDOH) Interventions    Readmission Risk Interventions No flowsheet data found.

## 2019-05-20 ENCOUNTER — Other Ambulatory Visit: Payer: Self-pay

## 2019-05-20 LAB — CBC
HCT: 21.9 % — ABNORMAL LOW (ref 39.0–52.0)
Hemoglobin: 7 g/dL — ABNORMAL LOW (ref 13.0–17.0)
MCH: 35 pg — ABNORMAL HIGH (ref 26.0–34.0)
MCHC: 32 g/dL (ref 30.0–36.0)
MCV: 109.5 fL — ABNORMAL HIGH (ref 80.0–100.0)
Platelets: 202 10*3/uL (ref 150–400)
RBC: 2 MIL/uL — ABNORMAL LOW (ref 4.22–5.81)
RDW: 15.1 % (ref 11.5–15.5)
WBC: 7.2 10*3/uL (ref 4.0–10.5)
nRBC: 0 % (ref 0.0–0.2)

## 2019-05-20 LAB — RENAL FUNCTION PANEL
Albumin: 2.6 g/dL — ABNORMAL LOW (ref 3.5–5.0)
Anion gap: 16 — ABNORMAL HIGH (ref 5–15)
BUN: 39 mg/dL — ABNORMAL HIGH (ref 6–20)
CO2: 25 mmol/L (ref 22–32)
Calcium: 8.8 mg/dL — ABNORMAL LOW (ref 8.9–10.3)
Chloride: 90 mmol/L — ABNORMAL LOW (ref 98–111)
Creatinine, Ser: 9.2 mg/dL — ABNORMAL HIGH (ref 0.61–1.24)
GFR calc Af Amer: 6 mL/min — ABNORMAL LOW (ref >60)
GFR calc non Af Amer: 6 mL/min — ABNORMAL LOW (ref >60)
Glucose, Bld: 94 mg/dL (ref 70–99)
Phosphorus: 7.2 mg/dL — ABNORMAL HIGH (ref 2.5–4.6)
Potassium: 4.5 mmol/L (ref 3.5–5.1)
Sodium: 131 mmol/L — ABNORMAL LOW (ref 135–145)

## 2019-05-20 LAB — PREPARE RBC (CROSSMATCH)

## 2019-05-20 LAB — SARS CORONAVIRUS 2 BY RT PCR (HOSPITAL ORDER, PERFORMED IN ~~LOC~~ HOSPITAL LAB): SARS Coronavirus 2: NEGATIVE

## 2019-05-20 MED ORDER — HEPARIN SODIUM (PORCINE) 1000 UNIT/ML IJ SOLN
INTRAMUSCULAR | Status: AC
  Administered 2019-05-20: 3800 [IU] via INTRAVENOUS_CENTRAL
  Filled 2019-05-20: qty 4

## 2019-05-20 MED ORDER — SODIUM CHLORIDE 0.9% IV SOLUTION: Freq: Once | INTRAVENOUS | Status: DC

## 2019-05-20 MED ORDER — HEPARIN SODIUM (PORCINE) 1000 UNIT/ML IJ SOLN
INTRAMUSCULAR | Status: AC
  Administered 2019-05-20: 1300 [IU] via INTRAVENOUS_CENTRAL
  Filled 2019-05-20: qty 2

## 2019-05-20 MED ORDER — HEPARIN SODIUM (PORCINE) 1000 UNIT/ML DIALYSIS
20.0000 [IU]/kg | INTRAMUSCULAR | Status: DC | PRN
  Administered 2019-05-20: 1300 [IU] via INTRAVENOUS_CENTRAL
  Administered 2019-05-20: 3800 [IU] via INTRAVENOUS_CENTRAL

## 2019-05-20 NOTE — Progress Notes (Signed)
In dialysis. Working on walking. Ready for SNF just waiting on insurance to approve.

## 2019-05-20 NOTE — Procedures (Signed)
Patient was seen on dialysis and the procedure was supervised.  BFR 350  Via TDC BP is  125/74.   Patient appears to be tolerating treatment well  Louis Meckel 05/20/2019

## 2019-05-20 NOTE — Progress Notes (Signed)
Patient off unit to dialysis.

## 2019-05-20 NOTE — Plan of Care (Signed)

## 2019-05-20 NOTE — Progress Notes (Signed)
Subjective:  No new c/o's  - accepted at [facility] but still waiting on insurance clearance   Objective Vital signs in last 24 hours: Vitals:   05/19/19 1549 05/19/19 1928 05/20/19 0007 05/20/19 0432  BP: 115/65 117/76 139/78 (!) 168/93  Pulse:  80 91 93  Resp: 18 18 16 18   Temp: 99 F (37.2 C) 98.6 F (37 C) 99 F (37.2 C) 98.7 F (37.1 C)  TempSrc: Tympanic Oral Oral Oral  SpO2:  97% 97% 99%  Weight:      Height:       Weight change:   Intake/Output Summary (Last 24 hours) at 05/20/2019 0804 Last data filed at 05/19/2019 1700 Gross per 24 hour  Intake 480 ml  Output 0 ml  Net 480 ml   Dialysis Orders:  [facility] - [facility]  4hrs, BFR 450, DFR 800,  [id number], 2K/ 2.5Ca  Access: L IJ TDC, RU AVF now matured, ready for use 7/14 Heparin 1800 unit bolus Mircera 100 mcg q2wks - last on 7/2 Calcitriol 0.5 mcg PO qHD  Assessment/Plan: 1.  Osteodystrophy w/ rapidly progressing cervical kyphosis, spondylosis and stenosis - s/p cervical discectomy/ fusion revision. Per Dr. Lorin Mercy.  Going to SNF eventually  2.  ESRD -  On HD TTS via TDC.  HD today per regular schedule.  No Heparin. one needle in AVF not successful here- will table for now.   3.  Hypertension/volume  - BP soft.  does not appear grossly volume overloaded.  Titrate down volume as tolerated. BP very well controlled on norvasc 10, clonidine 0.1 BID and hydralazine 50 BID and lopressor.  I dec hydralazine to 25 BID- was at [facility] post HD yest 4.  Anemia of CKD - Hgb 7.2, aranesp 200 weekly q Tuesday.  Follow. Transfuse as indicated.  stable 5.  Secondary Hyperparathyroidism -  Ca in goal.  Phos elevated as OP. Continue rocaltrol and renvela- last phos 5.6  6.  Nutrition - Renal diet w/fluid restrictions, Vit.  Also takes lokelma on Mondays      [place]: Basic Metabolic Panel: Recent Labs  Lab 05/15/19 6629  05/15/19 1349 05/16/19 1126 05/18/19 0803  NA 136   < > 135 138 135  K 4.0   < > 4.7 4.9 4.2   CL 96*  --   --  101 93*  CO2 23  --   --  21* 23  GLUCOSE 89  --  119* 96 121*  BUN 30*  --   --  45* 36*  CREATININE 9.32*  --   --  11.16* 9.34*  CALCIUM 9.5  --   --  8.9 9.0  PHOS  --   --   --   --  5.6*   < > = values in this interval not displayed.   Liver Function Tests: Recent Labs  Lab 05/18/19 0803  ALBUMIN 2.8*   No results for input(s): LIPASE, AMYLASE in the last 168 hours. No results for input(s): AMMONIA in the last 168 hours. CBC: Recent Labs  Lab 05/15/19 0832  05/16/19 1126 05/17/19 0433 05/18/19 0849  WBC 4.5  --  6.6 6.3 5.9  HGB 8.5*   < > 7.1* 7.0* 7.2*  HCT 27.4*   < > 21.9* 20.9* 22.2*  MCV 114.6*  --  110.1* 106.6* 108.8*  PLT 189  --  149* 135* 148*   < > = values in this interval not displayed.   Cardiac Enzymes: No  results for input(s): CKTOTAL, CKMB, CKMBINDEX, TROPONINI in the last 168 hours. CBG: No results for input(s): GLUCAP in the last 168 hours.  Iron Studies: No results for input(s): IRON, TIBC, TRANSFERRIN, FERRITIN in the last 72 hours. Studies/Results: No results found. Medications: Infusions: . lactated ringers 10 mL/hr at 05/15/19 1843  . methocarbamol (ROBAXIN) IV      Scheduled Medications: . sodium chloride   Intravenous Once  . amLODipine  10 mg Oral Daily  . calcitRIOL  0.5 mcg Oral Q T,Th,Sa-HD  . Chlorhexidine Gluconate Cloth  6 each Topical Q0600  . Chlorhexidine Gluconate Cloth  6 each Topical Q0600  . Chlorhexidine Gluconate Cloth  6 each Topical Q0600  . cloNIDine  0.1 mg Oral BID  . darbepoetin (ARANESP) injection - DIALYSIS  200 mcg Intravenous Q Tue-HD  . docusate sodium  100 mg Oral BID  . heparin      . hydrALAZINE  25 mg Oral BID  . metoprolol tartrate  150 mg Oral BID  . multivitamin  1 tablet Oral QHS  . sevelamer carbonate  3,200 mg Oral TID WC  . sodium zirconium cyclosilicate  10 g Oral Q Mon  . thiamine  100 mg Oral Daily  . valACYclovir  500 mg Oral Daily  . cyanocobalamin  2,000 mcg  Oral Daily    have reviewed scheduled and prn medications.  Physical Exam: General: NAD Heart: RRR Lungs: mostly clear Abdomen: soft, non tender Extremities: no edema Dialysis Access: TDC and right upper AVF    05/20/2019,8:04 AM  LOS: 5 days

## 2019-05-20 NOTE — Progress Notes (Signed)
Pt received 1 PRBCs in hemodialysis; no s/s of a reaction noted, tolerated the administration well.

## 2019-05-21 NOTE — Progress Notes (Signed)
Subjective:  No new c/o's  - accepted at [facility] but still waiting on insurance clearance.  HD yest- removed 3000 tolerated well-  Low grade temp 99.4   Objective Vital signs in last 24 hours: Vitals:   05/20/19 2011 05/20/19 2155 05/21/19 0400 05/21/19 0748  BP: 136/82 127/84 121/71 (!) 141/81  Pulse:  86 82 81  Resp:   20 20  Temp:   99.2 F (37.3 C) 98.7 F (37.1 C)  TempSrc:   Oral Oral  SpO2:   100% 100%  Weight:      Height:       Weight change: -3.9 kg  Intake/Output Summary (Last 24 hours) at 05/21/2019 0847 Last data filed at 05/20/2019 1843 Gross per 24 hour  Intake 354 ml  Output 3000 ml  Net -2646 ml   Dialysis Orders:  TTS - [place]  4hrs, BFR 450, DFR 800,  YNW29FA, 2K/ 2.5Ca  Access: L IJ TDC, RU AVF now matured, ready for use 7/14 Heparin 1800 unit bolus Mircera 100 mcg q2wks - last on 7/2 Calcitriol 0.5 mcg PO qHD  Assessment/Plan: 1.  Osteodystrophy w/ rapidly progressing cervical kyphosis, spondylosis and stenosis - s/p cervical discectomy/ fusion revision. Per Dr. Lorin Mercy.  Going to SNF eventually  2.  ESRD -  On HD TTS via TDC.  HD Sat per regular schedule.  No Heparin. one needle in AVF not successful here- will table for now.  Next HD will be Tuesday- hopefully as OP 3.  Hypertension/volume  - BP soft.  does not appear grossly volume overloaded.  Titrate down volume as tolerated. BP very well controlled on norvasc 10, clonidine 0.1 BID and hydralazine 50 BID and lopressor.  I dec hydralazine to 25 BID- was at [facility] post HD yest 4.  Anemia of CKD - Hgb 7.2, aranesp 200 weekly q Tuesday.  Follow. Transfuse as indicated.  stable but low 5.  Secondary Hyperparathyroidism -  Ca in goal.  Phos elevated as OP. Continue rocaltrol and renvela- last phos 5.6/7.2  6.  Nutrition - Renal diet w/fluid restrictions, Vit.  Also takes lokelma on Mondays   7.  Dispo- hopefully to SNF tomorrow    Louis Meckel    Labs: Basic Metabolic Panel: Recent Labs  Lab  05/16/19 1126 05/18/19 0803 05/20/19 0737  NA 138 135 131*  K 4.9 4.2 4.5  CL 101 93* 90*  CO2 21* 23 25  GLUCOSE 96 121* 94  BUN 45* 36* 39*  CREATININE 11.16* 9.34* 9.20*  CALCIUM 8.9 9.0 8.8*  PHOS  --  5.6* 7.2*   Liver Function Tests: Recent Labs  Lab 05/18/19 0803 05/20/19 0737  ALBUMIN 2.8* 2.6*   No results for input(s): LIPASE, AMYLASE in the last 168 hours. No results for input(s): AMMONIA in the last 168 hours. CBC: Recent Labs  Lab 05/15/19 0832  05/16/19 1126 05/17/19 0433 05/18/19 0849 05/20/19 0737  WBC 4.5  --  6.6 6.3 5.9 7.2  HGB 8.5*   < > 7.1* 7.0* 7.2* 7.0*  HCT 27.4*   < > 21.9* 20.9* 22.2* 21.9*  MCV 114.6*  --  110.1* 106.6* 108.8* 109.5*  PLT 189  --  149* 135* 148* 202   < > = values in this interval not displayed.   Cardiac Enzymes: No results for input(s): CKTOTAL, CKMB, CKMBINDEX, TROPONINI in the last 168 hours. CBG: No results for input(s): GLUCAP in the last 168 hours.  Iron Studies: No results for input(s): IRON, TIBC, TRANSFERRIN,  FERRITIN in the last 72 hours. Studies/Results: No results found. Medications: Infusions: . lactated ringers 10 mL/hr at 05/15/19 1843  . methocarbamol (ROBAXIN) IV      Scheduled Medications: . sodium chloride   Intravenous Once  . sodium chloride   Intravenous Once  . amLODipine  10 mg Oral Daily  . calcitRIOL  0.5 mcg Oral Q T,Th,Sa-HD  . Chlorhexidine Gluconate Cloth  6 each Topical Q0600  . Chlorhexidine Gluconate Cloth  6 each Topical Q0600  . Chlorhexidine Gluconate Cloth  6 each Topical Q0600  . cloNIDine  0.1 mg Oral BID  . darbepoetin (ARANESP) injection - DIALYSIS  200 mcg Intravenous Q Tue-HD  . docusate sodium  100 mg Oral BID  . hydrALAZINE  25 mg Oral BID  . metoprolol tartrate  150 mg Oral BID  . multivitamin  1 tablet Oral QHS  . sevelamer carbonate  3,200 mg Oral TID WC  . sodium zirconium cyclosilicate  10 g Oral Q Mon  . thiamine  100 mg Oral Daily  . valACYclovir  500  mg Oral Daily  . cyanocobalamin  2,000 mcg Oral Daily    have reviewed scheduled and prn medications.  Physical Exam: General: NAD Heart: RRR Lungs: mostly clear Abdomen: soft, non tender Extremities: no edema Dialysis Access: TDC and right upper AVF    05/21/2019,8:47 AM  LOS: 6 days

## 2019-05-21 NOTE — Progress Notes (Signed)
Patient ID: Maurice Bell, male   DOB: 1955/12/18, 63 y.o.   MRN: [id number] No acute changes.  Vitals stable.  Awaiting short-term skilled nursing placement.

## 2019-05-22 LAB — RENAL FUNCTION PANEL
Albumin: 2.4 g/dL — ABNORMAL LOW (ref 3.5–5.0)
Anion gap: 15 (ref 5–15)
BUN: 43 mg/dL — ABNORMAL HIGH (ref 6–20)
CO2: 23 mmol/L (ref 22–32)
Calcium: 8.7 mg/dL — ABNORMAL LOW (ref 8.9–10.3)
Chloride: 94 mmol/L — ABNORMAL LOW (ref 98–111)
Creatinine, Ser: 8.84 mg/dL — ABNORMAL HIGH (ref 0.61–1.24)
GFR calc Af Amer: 7 mL/min — ABNORMAL LOW (ref >60)
GFR calc non Af Amer: 6 mL/min — ABNORMAL LOW (ref >60)
Glucose, Bld: 93 mg/dL (ref 70–99)
Phosphorus: 6.5 mg/dL — ABNORMAL HIGH (ref 2.5–4.6)
Potassium: 4.7 mmol/L (ref 3.5–5.1)
Sodium: 132 mmol/L — ABNORMAL LOW (ref 135–145)

## 2019-05-22 NOTE — Progress Notes (Addendum)
Ria Comment, [place] called from hemodialysis and said patient is cleared to leave hospital.  [facility] will be here at 4pm to pick him up.

## 2019-05-22 NOTE — TOC Transition Note (Signed)
Transition of Care [facility]) - CM/SW Discharge Note   Patient Details  Name: Maurice Bell MRN: [id number] Date of Birth: [birth year]  Transition of Care [facility]) CM/SW Contact:  Vinie Sill, [place] Phone Number: 05/22/2019, 12:54 PM   Clinical Narrative:     Patient will DC to: [place] Date: 05/22/2019 Family Notified: patient is fully alert and oriented- CSW left voice message with the patient's son, Haris. Transport By: Corey [name] pick up @ 4pm  RN, patient, and facility notified of DC. Discharge Summary sent to facility. RN given number for report [phone], Room 606P.  Ambulance transport requested for patient.   Clinical Social Worker signing off.  [name], MSW, [facility] Clinical Social Worker [phone]    Final next level of care: Skilled Nursing Facility Barriers to Discharge: Barriers Resolved   Patient Goals and CMS Choice Patient states their goals for this hospitalization and ongoing recovery are:: get well so I can get back home CMS Medicare.gov Compare Post Acute Care list provided to:: Patient Choice offered to / list presented to : Patient  Discharge Placement   Existing PASRR number confirmed : 05/17/19          Patient chooses bed at: [facility] Patient to be transferred to facility by: [place] Name of family member notified: Evangeline Gula, son- CSW left voice message- patient is fully alert and oriented Patient and family notified of of transfer: 05/22/19  Discharge Plan and Services In-house Referral: Clinical Social Work                                   Social Determinants of Health ([place]) Interventions     Readmission Risk Interventions Readmission Risk Prevention Plan 05/19/2019  Transportation Screening Complete  Medication Review Press photographer) Complete  PCP or Specialist appointment within 3-5 days of discharge Complete  HRI or Home Care Consult Complete  SW Recovery Care/Counseling Consult Complete   [place] Complete  Some recent data might be hidden
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
## 2019-05-22 NOTE — Progress Notes (Signed)
Physical Therapy Treatment Patient Details Name: Maurice Bell MRN: [id number] DOB: 02-07-1956 Today's Date: 05/22/2019    History of Present Illness 63 year old male long-term dialysis patient with renal osteodystrophy and rapidly progressing cervical kyphosis,spondylosis and stenosis. O 05/03/19 pt underwent a C3-7 usionwith allograft and plate. Patient return to the office had had increased pain in his back weakness in his legs and some increased pain in his neck.  C-spine x-ray showed recurrent kyphosis and stenosis at the C3 level with posterior vertebral body retropulsion causing stenosis. Patient then underwent removal of 4 level anterior cervical play and screws, and then repeat 4 level plating and then repositioned to prone and underwent of 4 level posteior fusion with lateral mass screws for additional fixation. MRI also revealed significant spondylosis and collapse at L5-S1.    PT Comments    Patient progressing with mobility able to walk around loop in hallway, but needing seated rest breaks due to limited postural stability/endurance.  Will benefit from SNF level rehab at d/c.    Follow Up Recommendations  SNF     Equipment Recommendations  None recommended by PT    Recommendations for Other Services       Precautions / Restrictions Precautions Precautions: Fall;Cervical Required Braces or Orthoses: Cervical Brace Cervical Brace: Hard collar;At all times Restrictions Weight Bearing Restrictions: No    Mobility  Bed Mobility Overal bed mobility: Needs Assistance Bed Mobility: Rolling;Sidelying to Sit Rolling: Supervision Sidelying to sit: Supervision;HOB elevated       General bed mobility comments: use of rail  Transfers Overall transfer level: Needs assistance Equipment used: 4-wheeled walker Transfers: Sit to/from Stand Sit to Stand: Min guard         General transfer comment: assist for balance  Ambulation/Gait Ambulation/Gait assistance: Min  guard;Min assist Gait Distance (Feet): 60 Feet(x 2 & 20') Assistive device: 4-wheeled walker Gait Pattern/deviations: Step-to pattern;Step-through pattern;Decreased stride length;Trunk flexed     General Gait Details: cues for posture, pt fatigues quickly with ambulation and postural decline so seated rest x 2 during ambulation this session on rollator   Stairs             Wheelchair Mobility    Modified Rankin (Stroke Patients Only)       Balance Overall balance assessment: Needs assistance Sitting-balance support: Feet supported Sitting balance-Leahy Scale: Good     Standing balance support: Bilateral upper extremity supported Standing balance-Leahy Scale: Poor Standing balance comment: reliant/reaching for ext support                            Cognition Arousal/Alertness: Awake/alert Behavior During Therapy: WFL for tasks assessed/performed Overall Cognitive Status: Within Functional Limits for tasks assessed                                        Exercises Other Exercises Other Exercises: STS x 5 without UE support Other Exercises: seated scapular squeezes x 5 Other Exercises: standing glut sets x 5    General Comments        Pertinent Vitals/Pain Pain Assessment: Faces Faces Pain Scale: Hurts even more Pain Location: neck, shoulders with mobility Pain Descriptors / Indicators: Aching;Sore Pain Intervention(s): Monitored during session;Repositioned    Home Living                      Prior Function  PT Goals (current goals can now be found in the care plan section) Acute Rehab PT Goals Patient Stated Goal: go to rehab today Progress towards PT goals: Progressing toward goals    Frequency    Min 5X/week      PT Plan Current plan remains appropriate    Co-evaluation              AM-PAC PT "6 Clicks" Mobility   Outcome Measure  Help needed turning from your back to your side  while in a flat bed without using bedrails?: A Little Help needed moving from lying on your back to sitting on the side of a flat bed without using bedrails?: A Little Help needed moving to and from a bed to a chair (including a wheelchair)?: A Little Help needed standing up from a chair using your arms (e.g., wheelchair or bedside chair)?: A Little Help needed to walk in hospital room?: A Little Help needed climbing 3-5 steps with a railing? : A Little 6 Click Score: 18    End of Session Equipment Utilized During Treatment: Gait belt;Cervical collar Activity Tolerance: Patient tolerated treatment well Patient left: in bed;with call bell/phone within reach   PT Visit Diagnosis: Unsteadiness on feet (R26.81);Other abnormalities of gait and mobility (R26.89)     Time: 0940-1008 PT Time Calculation (min) (ACUTE ONLY): 28 min  Charges:  $Gait Training: 23-37 mins                     Magda Kiel, [place] Cicero [phone] 05/22/2019    Reginia Naas 05/22/2019, 2:39 PM

## 2019-05-22 NOTE — Progress Notes (Signed)
Neck is hurting still but hes ready to be discharged to [facility] but is waiting for discharge from doctor.

## 2019-05-22 NOTE — Progress Notes (Signed)
Renal Navigator notified OP [facility] of plan for discharge today to SNF to provide continuity of care.  Alphonzo Cruise, [place] Renal Navigator [phone]

## 2019-05-22 NOTE — Discharge Instructions (Signed)
Keep collar on . Use walker to avoid falling. See Dr. Lorin Mercy in 2 weeks. Office phone [phone]

## 2019-05-22 NOTE — Progress Notes (Addendum)
Subjective: Patient doing well.  C/o dysphagia and regurgitation with solid foods (had eaten meatloaf).  Awaiting transfer to snf.    Objective: Vital signs in last 24 hours: Temp:  [98.4 F (36.9 C)-99.5 F (37.5 C)] 99 F (37.2 C) (07/20 0754) Pulse Rate:  [71-84] 80 (07/20 0754) Resp:  [18-20] 20 (07/20 0754) BP: (100-128)/(63-74) 120/74 (07/20 0754) SpO2:  [99 %-100 %] 100 % (07/20 0754)  Intake/Output from previous day: 07/19 0701 - 07/20 0700 In: 942 [P.O.:942] Out: -  Intake/Output this shift: No intake/output data recorded.  Recent Labs    05/20/19 0737  HGB 7.0*   Recent Labs    05/20/19 0737  WBC 7.2  RBC 2.00*  HCT 21.9*  PLT 202   Recent Labs    05/20/19 0737 05/22/19 0623  NA 131* 132*  K 4.5 4.7  CL 90* 94*  CO2 25 23  BUN 39* 43*  CREATININE 9.20* 8.84*  GLUCOSE 94 93  CALCIUM 8.8* 8.7*   No results for input(s): LABPT, INR in the last 72 hours.  Exam: Very pleasant male, alert and oriented, NAD.  Moves upper and lower extremities well.      Assessment/Plan: Transfer to snf today if approved by insurance and stable from nephrology. Will change diet to soft.  Spoke with RN regarding this.  Advised patient that with multilevel anterior cervical fusion he can expect to have some dysphagia for a while therefore should avoid foods that are difficult to get down.  Understands the risk of choking and aspiration.       Benjiman Core 05/22/2019, 11:11 AM

## 2019-05-22 NOTE — Progress Notes (Addendum)
[facility] Progress Note   Subjective:   Patient seen and examined at bedside.  Ready to go home.  Likely to be d/c today.  Denies SOB, CP and edema.   Objective Vitals:   05/21/19 2113 05/21/19 2116 05/22/19 0407 05/22/19 0754  BP: 126/69 126/69 128/73 120/74  Pulse:   84 80  Resp:   18 20  Temp:   99.5 F (37.5 C) 99 F (37.2 C)  TempSrc:   Oral Oral  SpO2:   100% 100%  Weight:      Height:       Physical Exam General:NAD Heart:RRR Lungs:CTAB Abdomen:soft, NTND Extremities:no LE edema Dialysis Access: [facility] and RU AVF +b   Filed Weights   05/18/19 1139 05/20/19 0644 05/20/19 1137  Weight: 58.5 kg 62.2 kg 58.3 kg    Intake/Output Summary (Last 24 hours) at 05/22/2019 1304 Last data filed at 05/22/2019 0800 Gross per 24 hour  Intake 1182 ml  Output 0 ml  Net 1182 ml    Additional Objective Labs: Basic Metabolic Panel: Recent Labs  Lab 05/18/19 0803 05/20/19 0737 05/22/19 0623  NA 135 131* 132*  K 4.2 4.5 4.7  CL 93* 90* 94*  CO2 23 25 23   GLUCOSE 121* 94 93  BUN 36* 39* 43*  CREATININE 9.34* 9.20* 8.84*  CALCIUM 9.0 8.8* 8.7*  PHOS 5.6* 7.2* 6.5*   Liver Function Tests: Recent Labs  Lab 05/18/19 0803 05/20/19 0737 05/22/19 0623  ALBUMIN 2.8* 2.6* 2.4*   CBC: Recent Labs  Lab 05/16/19 1126 05/17/19 0433 05/18/19 0849 05/20/19 0737  WBC 6.6 6.3 5.9 7.2  HGB 7.1* 7.0* 7.2* 7.0*  HCT 21.9* 20.9* 22.2* 21.9*  MCV 110.1* 106.6* 108.8* 109.5*  PLT 149* 135* 148* 202    Medications: . lactated ringers 10 mL/hr at 05/15/19 1843  . methocarbamol (ROBAXIN) IV     . sodium chloride   Intravenous Once  . sodium chloride   Intravenous Once  . amLODipine  10 mg Oral Daily  . calcitRIOL  0.5 mcg Oral Q T,Th,Sa-HD  . Chlorhexidine Gluconate Cloth  6 each Topical Q0600  . Chlorhexidine Gluconate Cloth  6 each Topical Q0600  . Chlorhexidine Gluconate Cloth  6 each Topical Q0600  . cloNIDine  0.1 mg Oral BID  . darbepoetin  (ARANESP) injection - DIALYSIS  200 mcg Intravenous Q Tue-HD  . docusate sodium  100 mg Oral BID  . hydrALAZINE  25 mg Oral BID  . metoprolol tartrate  150 mg Oral BID  . multivitamin  1 tablet Oral QHS  . sevelamer carbonate  3,200 mg Oral TID WC  . sodium zirconium cyclosilicate  10 g Oral Q Mon  . thiamine  100 mg Oral Daily  . valACYclovir  500 mg Oral Daily  . cyanocobalamin  2,000 mcg Oral Daily    Dialysis Orders: [facility] 4hrs, BFR450, [id number], [id number],2N/0.5LZ  Access:L IJ TDC, RU AVF now matured, ready for use 7/14 Heparin1800 unit bolus Mircera177mcg q2wks - last on 7/2 Calcitriol0.40mcg PO qHD  Assessment/Plan: Assessment/Plan: 1. Osteodystrophy w/ rapidly progressing cervical kyphosis, spondylosis and stenosis - s/p cervical discectomy/ fusion revision. Per Dr. Lorin Mercy.  Going to SNF today hopefully. 2. ESRD - On HD TTS via [facility].  K 4.7.No Heparin. one needle in AVF not successful here- will try again as OP.  Will write orders for HD tomorrow if remains inpatient.  3. Hypertension/volume - BP soft.  does not appear grossly volume overloaded. Close to EDW. BP  very well controlled on norvasc 10, clonidine 0.1 BID and hydralazine 50 BID and lopressor.  I dec hydralazine to 25 BID 4. Anemia of CKD - last Hgb 7.0, aranesp 200 weekly q Tuesday. Follow. Transfuse as indicated. stable but low 5. Secondary Hyperparathyroidism - Ca in goal. Phos elevated. Continue rocaltrol and renvela  6. Nutrition - Renal diet w/fluid restrictions, Vit.  Also takes lokelma on Mondays 7.  Dispo- OK to d/c from a renal standpoint  Jen Mow, PA-C [facility] Pager: [phone] 05/22/2019,1:04 PM  LOS: 7 days   Pt seen, examined and agree w A/P as above.  Kelly Splinter  MD 05/22/2019, 4:19 PM

## 2019-05-22 NOTE — Progress Notes (Signed)
Dr. Ricard Dillon said patient can discharge once nephrology clears him to leave. Social worker also said he is leaving to [place] [facility] and she will call me when she has scheduled PTAR to pick him up/

## 2019-05-22 NOTE — Progress Notes (Signed)
Patient is having difficulty swallowing solid foods.  He can swallow pills better than food.  He did not much food this morning and refused Renvela this morning and does frequently refuses this medicine because he said hes not eating enough food to take this pill.

## 2019-05-22 NOTE — Progress Notes (Signed)
Occupational Therapy Treatment Patient Details Name: Maurice Bell MRN: [id number] DOB: 12-18-1955 Today's Date: 05/22/2019    History of present illness 63 year old male long-term dialysis patient with renal osteodystrophy and rapidly progressing cervical kyphosis,spondylosis and stenosis. O 05/03/19 pt underwent a C3-7 usionwith allograft and plate. Patient return to the office had had increased pain in his back weakness in his legs and some increased pain in his neck.  C-spine x-ray showed recurrent kyphosis and stenosis at the C3 level with posterior vertebral body retropulsion causing stenosis. Patient then underwent removal of 4 level anterior cervical play and screws, and then repeat 4 level plating and then repositioned to prone and underwent of 4 level posteior fusion with lateral mass screws for additional fixation. MRI also revealed significant spondylosis and collapse at L5-S1.   OT comments  Pt progressing toward stated goals, with post surgical pain/precautions and decreased activity tol limiting ability to engage in BADL at desired level of ind. Pt performed standing grooming at min guard A, reliant on external support intermittently for balance. Cues given for pt to use cup to prevent leaning into sink. Pt then donned tshirt in preparation for d/c this date, needing mod A to pull down in the back center. Education on use of AE for this and reinforced cervical precautions throughout session in application to BADL. Anticipate d/c to SNF this date, but will continue to follow per POC while acute.    Follow Up Recommendations  SNF    Equipment Recommendations  Other (comment)(defer)    Recommendations for Other Services      Precautions / Restrictions Precautions Precautions: Fall;Cervical Required Braces or Orthoses: Cervical Brace Cervical Brace: Hard collar;At all times Restrictions Weight Bearing Restrictions: No       Mobility Bed Mobility Overal bed mobility:  Modified Independent                Transfers Overall transfer level: Needs assistance Equipment used: None Transfers: Sit to/from Stand Sit to Stand: Supervision         General transfer comment: did not use rollator to BR, needed to use sink for occcasional steadying support    Balance Overall balance assessment: Needs assistance Sitting-balance support: Feet supported;No upper extremity supported Sitting balance-Leahy Scale: Good     Standing balance support: No upper extremity supported;During functional activity Standing balance-Leahy Scale: Poor Standing balance comment: reliant/reaching for ext support                           ADL either performed or assessed with clinical judgement   ADL Overall ADL's : Needs assistance/impaired     Grooming: Min guard;Standing;Oral care           Upper Body Dressing : Moderate assistance;Sitting Upper Body Dressing Details (indicate cue type and reason): to pull tshirt down back, unable to reach without AE                 Functional mobility during ADLs: Min guard;Rolling walker General ADL Comments: cont ADL initiation and practice     Vision Patient Visual Report: No change from baseline     Perception     Praxis      Cognition Arousal/Alertness: Awake/alert Behavior During Therapy: WFL for tasks assessed/performed Overall Cognitive Status: Within Functional Limits for tasks assessed  Exercises     Shoulder Instructions       General Comments      Pertinent Vitals/ Pain       Pain Assessment: Faces Faces Pain Scale: Hurts little more Pain Location: neck, shoulders with mobility Pain Descriptors / Indicators: Aching;Sore Pain Intervention(s): Monitored during session;Repositioned  Home Living                                          Prior Functioning/Environment              Frequency  Min  2X/week        Progress Toward Goals  OT Goals(current goals can now be found in the care plan section)  Progress towards OT goals: Progressing toward goals  Acute Rehab OT Goals Patient Stated Goal: go to rehab today OT Goal Formulation: With patient Time For Goal Achievement: 05/30/19 Potential to Achieve Goals: Good  Plan Discharge plan remains appropriate;Frequency remains appropriate    Co-evaluation                 AM-PAC OT "6 Clicks" Daily Activity     Outcome Measure   Help from another person eating meals?: A Little Help from another person taking care of personal grooming?: A Little Help from another person toileting, which includes using toliet, bedpan, or urinal?: A Little Help from another person bathing (including washing, rinsing, drying)?: A Little Help from another person to put on and taking off regular upper body clothing?: A Lot Help from another person to put on and taking off regular lower body clothing?: A Lot 6 Click Score: 16    End of Session Equipment Utilized During Treatment: Gait belt  OT Visit Diagnosis: Unsteadiness on feet (R26.81);Muscle weakness (generalized) (M62.81);Other abnormalities of gait and mobility (R26.89);Pain Pain - part of body: (neck)   Activity Tolerance Patient tolerated treatment well   Patient Left in chair;with call bell/phone within reach;with chair alarm set   Nurse Communication          Time: 0370-4888 OT Time Calculation (min): 17 min  Charges: OT General Charges $OT Visit: 1 Visit OT Treatments $Self Care/Home Management : 8-22 mins  Zenovia Jarred, MSOT, OTR/L [facility] Office: [phone]    Zenovia Jarred 05/22/2019, 12:46 PM

## 2019-05-23 LAB — BPAM RBC
Blood Product Expiration Date: 202008172359
Blood Product Expiration Date: 202008172359
ISSUE DATE / TIME: 202007181036
ISSUE DATE / TIME: 202007181036
Unit Type and Rh: 5100
Unit Type and Rh: 5100

## 2019-05-23 LAB — TYPE AND SCREEN
ABO/RH(D): O POS
Antibody Screen: NEGATIVE
Unit division: 0
Unit division: 0

## 2019-05-29 ENCOUNTER — Encounter (HOSPITAL_COMMUNITY): Payer: Self-pay | Admitting: Orthopaedic Surgery

## 2019-05-29 ENCOUNTER — Other Ambulatory Visit (INDEPENDENT_AMBULATORY_CARE_PROVIDER_SITE_OTHER): Payer: Self-pay | Admitting: Primary Care

## 2019-05-29 MED ORDER — VALACYCLOVIR HCL 500 MG PO TABS: 500.0000 mg | ORAL_TABLET | Freq: Every day | ORAL | 0 refills | Status: DC

## 2019-05-30 ENCOUNTER — Telehealth: Payer: Self-pay | Admitting: Orthopaedic Surgery

## 2019-05-30 ENCOUNTER — Encounter (HOSPITAL_COMMUNITY): Payer: Self-pay | Admitting: Orthopaedic Surgery

## 2019-05-30 NOTE — Telephone Encounter (Signed)
He can ask the therapist to see how he is doing , phone connection bad, I called him 5 different times , got cut off.

## 2019-05-30 NOTE — Telephone Encounter (Signed)
Patient called advised he can do (PT) at home with the help of his brother and his son. Patient asked if he can be discharged from [facility]. The number to contact patient is [phone]

## 2019-05-30 NOTE — Telephone Encounter (Signed)
I attempted to reach patient as well and could not. Will wait for him to call back to advise.

## 2019-05-30 NOTE — Telephone Encounter (Signed)
Please advise 

## 2019-06-14 ENCOUNTER — Ambulatory Visit (INDEPENDENT_AMBULATORY_CARE_PROVIDER_SITE_OTHER): Payer: Medicare HMO | Admitting: Orthopaedic Surgery

## 2019-06-14 ENCOUNTER — Encounter: Payer: Self-pay | Admitting: Orthopaedic Surgery

## 2019-06-14 ENCOUNTER — Ambulatory Visit (INDEPENDENT_AMBULATORY_CARE_PROVIDER_SITE_OTHER): Payer: Medicare HMO

## 2019-06-14 VITALS — Ht 65.0 in | Wt 128.0 lb

## 2019-06-14 DIAGNOSIS — Z981 Arthrodesis status: Secondary | ICD-10-CM | POA: Diagnosis not present

## 2019-06-14 MED ORDER — OXYCODONE-ACETAMINOPHEN 5-325 MG PO TABS: 1.0000 | ORAL_TABLET | Freq: Three times a day (TID) | ORAL | 0 refills | Status: DC | PRN
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
## 2019-06-22 ENCOUNTER — Other Ambulatory Visit (INDEPENDENT_AMBULATORY_CARE_PROVIDER_SITE_OTHER): Payer: Self-pay | Admitting: Primary Care

## 2019-07-25 ENCOUNTER — Emergency Department (HOSPITAL_COMMUNITY): Payer: Medicare HMO

## 2019-07-25 ENCOUNTER — Other Ambulatory Visit: Payer: Self-pay

## 2019-07-25 ENCOUNTER — Inpatient Hospital Stay (HOSPITAL_COMMUNITY)
Admission: EM | Admit: 2019-07-25 | Discharge: 2019-07-25 | DRG: 640 | Discharge status: Against Medical Advice | Payer: Medicare HMO | Attending: Internal Medicine | Admitting: Internal Medicine

## 2019-07-25 DIAGNOSIS — I160 Hypertensive urgency: Secondary | ICD-10-CM | POA: Diagnosis present

## 2019-07-25 DIAGNOSIS — G8929 Other chronic pain: Secondary | ICD-10-CM | POA: Diagnosis present

## 2019-07-25 DIAGNOSIS — Z9115 Patient's noncompliance with renal dialysis: Secondary | ICD-10-CM

## 2019-07-25 DIAGNOSIS — Z5329 Procedure and treatment not carried out because of patient's decision for other reasons: Secondary | ICD-10-CM | POA: Diagnosis present

## 2019-07-25 DIAGNOSIS — M898X9 Other specified disorders of bone, unspecified site: Secondary | ICD-10-CM | POA: Diagnosis present

## 2019-07-25 DIAGNOSIS — I161 Hypertensive emergency: Secondary | ICD-10-CM | POA: Diagnosis present

## 2019-07-25 DIAGNOSIS — J9601 Acute respiratory failure with hypoxia: Secondary | ICD-10-CM

## 2019-07-25 DIAGNOSIS — N186 End stage renal disease: Secondary | ICD-10-CM | POA: Diagnosis present

## 2019-07-25 DIAGNOSIS — M549 Dorsalgia, unspecified: Secondary | ICD-10-CM | POA: Diagnosis present

## 2019-07-25 DIAGNOSIS — E889 Metabolic disorder, unspecified: Secondary | ICD-10-CM | POA: Diagnosis present

## 2019-07-25 DIAGNOSIS — I4510 Unspecified right bundle-branch block: Secondary | ICD-10-CM | POA: Diagnosis present

## 2019-07-25 DIAGNOSIS — Z9111 Patient's noncompliance with dietary regimen: Secondary | ICD-10-CM | POA: Diagnosis not present

## 2019-07-25 DIAGNOSIS — I517 Cardiomegaly: Secondary | ICD-10-CM | POA: Diagnosis present

## 2019-07-25 DIAGNOSIS — R0602 Shortness of breath: Secondary | ICD-10-CM

## 2019-07-25 DIAGNOSIS — Z992 Dependence on renal dialysis: Secondary | ICD-10-CM

## 2019-07-25 DIAGNOSIS — E871 Hypo-osmolality and hyponatremia: Secondary | ICD-10-CM | POA: Diagnosis present

## 2019-07-25 DIAGNOSIS — J811 Chronic pulmonary edema: Secondary | ICD-10-CM | POA: Diagnosis present

## 2019-07-25 DIAGNOSIS — Z981 Arthrodesis status: Secondary | ICD-10-CM | POA: Diagnosis not present

## 2019-07-25 DIAGNOSIS — Z20828 Contact with and (suspected) exposure to other viral communicable diseases: Secondary | ICD-10-CM | POA: Diagnosis present

## 2019-07-25 DIAGNOSIS — Z8673 Personal history of transient ischemic attack (TIA), and cerebral infarction without residual deficits: Secondary | ICD-10-CM

## 2019-07-25 DIAGNOSIS — D631 Anemia in chronic kidney disease: Secondary | ICD-10-CM | POA: Diagnosis present

## 2019-07-25 DIAGNOSIS — I120 Hypertensive chronic kidney disease with stage 5 chronic kidney disease or end stage renal disease: Secondary | ICD-10-CM | POA: Diagnosis present

## 2019-07-25 DIAGNOSIS — J810 Acute pulmonary edema: Principal | ICD-10-CM | POA: Diagnosis present

## 2019-07-25 DIAGNOSIS — E8770 Fluid overload, unspecified: Principal | ICD-10-CM

## 2019-07-25 DIAGNOSIS — R9431 Abnormal electrocardiogram [ECG] [EKG]: Secondary | ICD-10-CM | POA: Diagnosis present

## 2019-07-25 LAB — RENAL FUNCTION PANEL
Albumin: 4.3 g/dL (ref 3.5–5.0)
Albumin: 3.7 g/dL (ref 3.5–5.0)
Anion gap: 16 — ABNORMAL HIGH (ref 5–15)
Anion gap: 15 (ref 5–15)
BUN: 39 mg/dL — ABNORMAL HIGH (ref 6–20)
BUN: 41 mg/dL — ABNORMAL HIGH (ref 6–20)
CO2: 25 mmol/L (ref 22–32)
CO2: 25 mmol/L (ref 22–32)
Calcium: 9.9 mg/dL (ref 8.9–10.3)
Calcium: 9.3 mg/dL (ref 8.9–10.3)
Chloride: 89 mmol/L — ABNORMAL LOW (ref 98–111)
Chloride: 89 mmol/L — ABNORMAL LOW (ref 98–111)
Creatinine, Ser: 8.95 mg/dL — ABNORMAL HIGH (ref 0.61–1.24)
Creatinine, Ser: 9.27 mg/dL — ABNORMAL HIGH (ref 0.61–1.24)
GFR calc Af Amer: 7 mL/min — ABNORMAL LOW (ref >60)
GFR calc Af Amer: 6 mL/min — ABNORMAL LOW (ref >60)
GFR calc non Af Amer: 6 mL/min — ABNORMAL LOW (ref >60)
GFR calc non Af Amer: 6 mL/min — ABNORMAL LOW (ref >60)
Glucose, Bld: 101 mg/dL — ABNORMAL HIGH (ref 70–99)
Glucose, Bld: 105 mg/dL — ABNORMAL HIGH (ref 70–99)
Phosphorus: 3.7 mg/dL (ref 2.5–4.6)
Phosphorus: 4.3 mg/dL (ref 2.5–4.6)
Potassium: 5.2 mmol/L — ABNORMAL HIGH (ref 3.5–5.1)
Potassium: 5.1 mmol/L (ref 3.5–5.1)
Sodium: 130 mmol/L — ABNORMAL LOW (ref 135–145)
Sodium: 129 mmol/L — ABNORMAL LOW (ref 135–145)

## 2019-07-25 LAB — CBC WITH DIFFERENTIAL/PLATELET
Abs Immature Granulocytes: 0.01 10*3/uL (ref 0.00–0.07)
Basophils Absolute: 0 10*3/uL (ref 0.0–0.1)
Basophils Relative: 1 %
Eosinophils Absolute: 0.2 10*3/uL (ref 0.0–0.5)
Eosinophils Relative: 3 %
HCT: 36.9 % — ABNORMAL LOW (ref 39.0–52.0)
Hemoglobin: 11.8 g/dL — ABNORMAL LOW (ref 13.0–17.0)
Immature Granulocytes: 0 %
Lymphocytes Relative: 12 %
Lymphs Abs: 0.8 10*3/uL (ref 0.7–4.0)
MCH: 34.9 pg — ABNORMAL HIGH (ref 26.0–34.0)
MCHC: 32 g/dL (ref 30.0–36.0)
MCV: 109.2 fL — ABNORMAL HIGH (ref 80.0–100.0)
Monocytes Absolute: 0.7 10*3/uL (ref 0.1–1.0)
Monocytes Relative: 11 %
Neutro Abs: 4.6 10*3/uL (ref 1.7–7.7)
Neutrophils Relative %: 73 %
Platelets: 190 10*3/uL (ref 150–400)
RBC: 3.38 MIL/uL — ABNORMAL LOW (ref 4.22–5.81)
RDW: 17.9 % — ABNORMAL HIGH (ref 11.5–15.5)
WBC: 6.2 10*3/uL (ref 4.0–10.5)
nRBC: 0 % (ref 0.0–0.2)

## 2019-07-25 LAB — POCT I-STAT 7, (LYTES, BLD GAS, ICA,H+H)
Acid-Base Excess: 3 mmol/L — ABNORMAL HIGH (ref 0.0–2.0)
Bicarbonate: 28.4 mmol/L — ABNORMAL HIGH (ref 20.0–28.0)
Calcium, Ion: 1.25 mmol/L (ref 1.15–1.40)
HCT: 33 % — ABNORMAL LOW (ref 39.0–52.0)
Hemoglobin: 11.2 g/dL — ABNORMAL LOW (ref 13.0–17.0)
O2 Saturation: 99 %
Patient temperature: 98
Potassium: 5.1 mmol/L (ref 3.5–5.1)
Sodium: 128 mmol/L — ABNORMAL LOW (ref 135–145)
TCO2: 30 mmol/L (ref 22–32)
pCO2 arterial: 47.3 mmHg (ref 32.0–48.0)
pH, Arterial: 7.385 (ref 7.350–7.450)
pO2, Arterial: 134 mmHg — ABNORMAL HIGH (ref 83.0–108.0)

## 2019-07-25 LAB — SARS CORONAVIRUS 2 (TAT 6-24 HRS): SARS Coronavirus 2: NEGATIVE

## 2019-07-25 LAB — SARS CORONAVIRUS 2 BY RT PCR (HOSPITAL ORDER, PERFORMED IN ~~LOC~~ HOSPITAL LAB): SARS Coronavirus 2: NEGATIVE

## 2019-07-25 LAB — TROPONIN I (HIGH SENSITIVITY): Troponin I (High Sensitivity): 24 ng/L — ABNORMAL HIGH (ref <18)

## 2019-07-25 MED ORDER — CHLORHEXIDINE GLUCONATE CLOTH 2 % EX PADS: 6.0000 | MEDICATED_PAD | Freq: Every day | CUTANEOUS | Status: DC

## 2019-07-25 MED ORDER — OXYCODONE HCL 5 MG PO TABS
ORAL_TABLET | ORAL | Status: AC
  Filled 2019-07-25: qty 2

## 2019-07-25 MED ORDER — CLONIDINE HCL 0.1 MG PO TABS
0.1000 mg | ORAL_TABLET | Freq: Two times a day (BID) | ORAL | Status: DC
  Administered 2019-07-25: 0.1 mg via ORAL
  Filled 2019-07-25: qty 1

## 2019-07-25 MED ORDER — ACETAMINOPHEN 650 MG RE SUPP: 650.0000 mg | Freq: Four times a day (QID) | RECTAL | Status: DC | PRN

## 2019-07-25 MED ORDER — HYDROMORPHONE HCL 1 MG/ML IJ SOLN
INTRAMUSCULAR | Status: AC
  Administered 2019-07-25: 10:00:00 0.5 mg via INTRAVENOUS
  Filled 2019-07-25: qty 0.5

## 2019-07-25 MED ORDER — OXYCODONE HCL 5 MG PO TABS
10.0000 mg | ORAL_TABLET | Freq: Four times a day (QID) | ORAL | Status: DC | PRN
  Administered 2019-07-25 (×2): 10 mg via ORAL
  Filled 2019-07-25: qty 2

## 2019-07-25 MED ORDER — ACETAMINOPHEN 325 MG PO TABS: 650.0000 mg | ORAL_TABLET | Freq: Four times a day (QID) | ORAL | Status: DC | PRN

## 2019-07-25 MED ORDER — HYDROMORPHONE HCL 1 MG/ML IJ SOLN
0.5000 mg | INTRAMUSCULAR | Status: DC | PRN
  Administered 2019-07-25 (×2): 0.5 mg via INTRAVENOUS
  Filled 2019-07-25: qty 1

## 2019-07-25 MED ORDER — NITROGLYCERIN IN D5W 200-5 MCG/ML-% IV SOLN
0.0000 ug/min | INTRAVENOUS | Status: DC
  Administered 2019-07-25: 5 ug/min via INTRAVENOUS
  Filled 2019-07-25: qty 250

## 2019-07-25 MED ORDER — NITROGLYCERIN 2 % TD OINT
1.0000 [in_us] | TOPICAL_OINTMENT | Freq: Once | TRANSDERMAL | Status: AC
  Administered 2019-07-25: 1 [in_us] via TOPICAL
  Filled 2019-07-25: qty 1

## 2019-07-25 MED ORDER — LIDOCAINE HCL (PF) 1 % IJ SOLN: 5.0000 mL | INTRAMUSCULAR | Status: DC | PRN

## 2019-07-25 MED ORDER — SODIUM CHLORIDE 0.9 % IV SOLN: 100.0000 mL | INTRAVENOUS | Status: DC | PRN

## 2019-07-25 MED ORDER — METOPROLOL TARTRATE 25 MG PO TABS
150.0000 mg | ORAL_TABLET | Freq: Two times a day (BID) | ORAL | Status: DC
  Administered 2019-07-25: 14:00:00 150 mg via ORAL
  Filled 2019-07-25: qty 6

## 2019-07-25 MED ORDER — HEPARIN SODIUM (PORCINE) 1000 UNIT/ML DIALYSIS
1000.0000 [IU] | INTRAMUSCULAR | Status: DC | PRN
  Filled 2019-07-25: qty 1

## 2019-07-25 MED ORDER — LIDOCAINE-PRILOCAINE 2.5-2.5 % EX CREA
1.0000 "application " | TOPICAL_CREAM | CUTANEOUS | Status: DC | PRN
  Filled 2019-07-25: qty 5

## 2019-07-25 MED ORDER — HEPARIN SODIUM (PORCINE) 5000 UNIT/ML IJ SOLN
5000.0000 [IU] | Freq: Three times a day (TID) | INTRAMUSCULAR | Status: DC
  Administered 2019-07-25: 5000 [IU] via SUBCUTANEOUS
  Filled 2019-07-25: qty 1

## 2019-07-25 MED ORDER — PENTAFLUOROPROP-TETRAFLUOROETH EX AERO
1.0000 "application " | INHALATION_SPRAY | CUTANEOUS | Status: DC | PRN
  Filled 2019-07-25: qty 116

## 2019-07-25 MED ORDER — AMLODIPINE BESYLATE 5 MG PO TABS: 10.0000 mg | ORAL_TABLET | Freq: Every day | ORAL | Status: DC

## 2019-07-25 NOTE — ED Provider Notes (Signed)
[facility] EMERGENCY DEPARTMENT Provider Note  CSN: [id number] Arrival date & time: 07/25/19 0010  Chief Complaint(s) Shortness of Breath  HPI Maurice Bell is a 63 y.o. male with extensive past medical history listed below including ESRD on dialysis TTS who presents to the emergency department with several hours of gradually worsening shortness of breath.  Patient reports not missing any dialysis sessions but they report his last session was cut short due to pain.  He does not remember his dry weight or weight after dialysis.  He did endorse not being compliant with his fluid restriction.  Reports that this is similar to prior presentations in the past for pulmonary edema due to volume overload.  He is endorsing some mild chest tightness related to pulmonary edema.  Denies any recent fevers or infections.  Endorses dry cough.  No abdominal pain.  No nausea or vomiting.  No other physical complaints.  HPI  Past Medical History Past Medical History:  Diagnosis Date   Anemia    Anxiety    Arthritis    Depression    Dyspnea    "only when I have too much fluid"   ESRD (end stage renal disease) on dialysis ([place])    "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   Injury to ligament of cervical spine 03/06/2019   Spinal stenosis of cervical region 02/03/2019   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   Numbness of left hand 02/03/2019   Pancytopenia ([place]) 11/27/2018   GERD (gastroesophageal reflux disease) 11/27/2018   Depression 11/27/2018   Abnormal EKG 11/27/2018   Other spondylosis with radiculopathy, cervical region 11/17/2018   Malnutrition of moderate degree 11/04/2018   Endotracheal tube present    Acute metabolic encephalopathy 0000000   Hypertensive urgency 11/03/2018   Anemia of chronic disease 11/03/2018   Uremia 10/06/2018   Hypertension    Myoclonic jerking    Hepatitis    History of anemia due to CKD    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 Authorizing Provider  acetaminophen (TYLENOL 8 HOUR ARTHRITIS PAIN) 650 MG CR tablet Take 1,300 mg by mouth every 8 (eight) hours as needed for pain.    [provider]  amLODipine (NORVASC) 10 MG tablet Take 1 tablet (10 mg total) by mouth at bedtime. Patient taking differently: Take 10 mg by mouth daily.  05/03/18   [name], PA-C  cloNIDine (CATAPRES) 0.1 MG tablet Take 1 tablet (0.1  mg total) by mouth 2 (two) times daily. Patient taking differently: Take 0.1 mg by mouth 3 (three) times daily.  05/03/18   [name], PA-C  hydrALAZINE (APRESOLINE) 50 MG tablet Take 50 mg by mouth 2 (two) times a day.  12/27/18   [provider]  Menthol-Methyl Salicylate (ICY HOT) Q000111Q % STCK Apply 1 application topically as needed (joint pain).    [provider]  metoprolol tartrate (LOPRESSOR) 50 MG tablet Take 150 mg by mouth 2 (two) times daily. 12/13/18   [provider]  multivitamin (RENA-VIT)  TABS tablet Take 1 tablet by mouth at bedtime. 09/11/18   [name], [name], MD  oxyCODONE-acetaminophen (PERCOCET) 5-325 MG tablet Take 1-2 tablets by mouth every 4 (four) hours as needed for severe pain. Patient not taking: Reported on 06/14/2019 05/18/19 05/17/20  Marybelle Killings, MD  oxyCODONE-acetaminophen (PERCOCET/ROXICET) 5-325 MG tablet Take 1 tablet by mouth every 8 (eight) hours as needed for severe pain. 06/14/19   Marybelle Killings, MD  sevelamer carbonate (RENVELA) 800 MG tablet Take 4 tablets (3,200 mg total) by mouth 3 (three) times daily with meals. Patient taking differently: Take 3,200 mg by mouth See admin instructions. Take 3200 mg by mouth three times daily with meals and 1600 mg with snacks 09/11/18   [name], [name], MD  sodium zirconium cyclosilicate (LOKELMA) 10 g PACK packet Take 10 g by mouth every Monday.     [provider]  thiamine 100 MG tablet Take 1 tablet (100 mg total) by mouth daily. 09/14/18   [name], [name], MD  valACYclovir (VALTREX) 500 MG tablet Take 1 tablet (500 mg total) by mouth daily. 05/29/19   [name], NP  vitamin B-12 2000 MCG tablet Take 1 tablet (2,000 mcg total) by mouth daily. Patient not taking: Reported on 05/15/2019 09/12/18   Mendel Corning, MD                                                                                                                                    Past Surgical History Past Surgical History:  Procedure Laterality Date   A/V FISTULAGRAM N/A 03/24/2019   Procedure: A/V FISTULAGRAM - right arm;  Surgeon: Angelia Mould, MD;  Location: [facility];  Service: Cardiovascular;  Laterality: N/A;   ANTERIOR CERVICAL DECOMPRESSION/DISCECTOMY FUSION 4 LEVEL/HARDWARE REMOVAL N/A 05/15/2019   Procedure: REMOVAL OF C3-C7 PLATE, REVISION C3-4 FUSION WITH PARTIAL CORPECTOMY AT C3 AND REPLATING;  Surgeon: Marybelle Killings, MD;  Location: [place];  Service: Orthopedics;  Laterality: N/A;   ANTERIOR CERVICAL  DECOMPRESSION/DISCECTOMY FUSION 4 LEVELS N/A 05/03/2019   Procedure: C3-4, C4-5, C5-6, C6-7 ANTERIOR CERVICAL DECOMPRESSION/DISCECTOMY FUSION, ALLOGRAFTS & PLATE;  Surgeon: Marybelle Killings, MD;  Location: [place];  Service: Orthopedics;  Laterality: N/A;   AV FISTULA PLACEMENT Bilateral    "right side not working anymore" (10/06/2018)   AV FISTULA  PLACEMENT Right 02/15/2019   Procedure: Creation of arteriovenous fistula, right arm;  Surgeon: Serafina Mitchell, MD;  Location: [place];  Service: Vascular;  Laterality: Right;   [place] Right 03/29/2019   Procedure: SECOND STAGE BASILIC VEIN TRANSPOSITION RIGHT ARM;  Surgeon: Serafina Mitchell, MD;  Location: [place];  Service: Vascular;  Laterality: Right;   COLON RESECTION     from [place] Left 11/07/2018   Procedure: INSERTION OF TUNNELED DIALYSIS CATHETER - [place];  Surgeon: Angelia Mould, MD;  Location: [place];  Service: Vascular;  Laterality: Left;   POSTERIOR CERVICAL FUSION/FORAMINOTOMY N/A 05/15/2019   Procedure: POSTERIOR CERVICAL FUSION/FORAMINOTOMY C3-7 WITH LATERAL MASS INSTRUMENTATION;  Surgeon: Marybelle Killings, MD;  Location: [place];  Service: Orthopedics;  Laterality: N/A;   TONSILLECTOMY     UMBILICAL HERNIA REPAIR     Family History Family History  Problem Relation Age of Onset   High blood pressure Other     Social History Social History   Tobacco Use   Smoking status: Never Smoker   Smokeless tobacco: Never Used   Tobacco comment: "smoked as a kid"  Substance Use Topics   Alcohol use: Yes    Alcohol/week: 1.0 standard drinks    Types: 1 Standard drinks or equivalent per week    Frequency: Never    Comment: occasional drink   Drug use: Never    Types: Marijuana    Comment:  "none since 1992"   Allergies Vicodin [hydrocodone-acetaminophen]  Review of Systems Review of  Systems All other systems are reviewed and are negative for acute change except as noted in the HPI  Physical Exam Vital Signs  I have reviewed the triage vital signs BP (!) 202/90    Pulse 77    Temp 98 F (36.7 C)    Resp 17    Ht 5\' 5"  (1.651 m)    Wt 58 kg    SpO2 86%    BMI 21.28 kg/m   Physical Exam Vitals signs reviewed.  Constitutional:      General: He is not in acute distress.    Appearance: He is well-developed. He is not diaphoretic.  HENT:     Head: Normocephalic and atraumatic.     Nose: Nose normal.  Eyes:     General: No scleral icterus.       Right eye: No discharge.        Left eye: No discharge.     Conjunctiva/sclera: Conjunctivae normal.     Pupils: Pupils are equal, round, and reactive to light.  Neck:     Musculoskeletal: Normal range of motion and neck supple.  Cardiovascular:     Rate and Rhythm: Normal rate and regular rhythm.     Heart sounds: No murmur. No friction rub. No gallop.      Arteriovenous access: right arteriovenous access is present. Pulmonary:     Effort: Tachypnea, accessory muscle usage and respiratory distress present.     Breath sounds: No stridor. Examination of the right-middle field reveals rales. Examination of the left-middle field reveals rales. Examination of the right-lower field reveals rales. Examination of the left-lower field reveals rales. Rales present.  Abdominal:     General: There is no distension.     Palpations: Abdomen is soft.     Tenderness: There is no abdominal tenderness.  Musculoskeletal:  General: No tenderness.     Right lower leg: 1+ Pitting Edema present.     Left lower leg: 1+ Pitting Edema present.  Skin:    General: Skin is warm and dry.     Findings: No erythema or rash.  Neurological:     Mental Status: He is alert and oriented to person, place, and time.     ED Results and Treatments Labs (all labs ordered are listed, but only abnormal results are displayed) Labs Reviewed  CBC  WITH DIFFERENTIAL/PLATELET - Abnormal; Notable for the following components:      Result Value   RBC 3.38 (*)    Hemoglobin 11.8 (*)    HCT 36.9 (*)    MCV 109.2 (*)    MCH 34.9 (*)    RDW 17.9 (*)    All other components within normal limits  RENAL FUNCTION PANEL - Abnormal; Notable for the following components:   Sodium 130 (*)    Potassium 5.2 (*)    Chloride 89 (*)    Glucose, Bld 101 (*)    BUN 39 (*)    Creatinine, Ser 8.95 (*)    GFR calc non Af Amer 6 (*)    GFR calc Af Amer 7 (*)    Anion gap 16 (*)    All other components within normal limits  SARS CORONAVIRUS 2 (TAT 6-24 HRS)  BLOOD GAS, ARTERIAL  TROPONIN I (HIGH SENSITIVITY)                                                                                                                         EKG  EKG Interpretation  Date/Time:  Tuesday July 25 2019 00:13:23 EDT Ventricular Rate:  83 PR Interval:    QRS Duration: 104 QT Interval:  391 QTC Calculation: 460 R Axis:   -93 Text Interpretation:  Sinus rhythm Ventricular premature complex Probable left atrial enlargement Incomplete RBBB and LAFB Repol abnrm suggests ischemia, anterolateral Confirmed by Addison Lank [phone]) on 07/25/2019 2:00:14 AM      Radiology Dg Chest Port 1 View  Result Date: 07/25/2019 CLINICAL DATA:  62 year old male with shortness of breath. EXAM: PORTABLE CHEST 1 VIEW COMPARISON:  Chest radiograph dated 04/26/2019 FINDINGS: Cardiomegaly, new since the prior radiograph. Bilateral mid to lower lung field predominant interstitial and hazy airspace densities most consistent with edema. Pneumonia is not excluded. Clinical correlation is recommended. Small pleural effusions may be present. No pneumothorax. No acute osseous pathology. IMPRESSION: Cardiomegaly with findings of CHF or fluid overload. Pneumonia is not excluded. Clinical correlation is recommended. Electronically Signed   By: Anner Crete M.D.   On: 07/25/2019 01:20     Pertinent labs & imaging results that were available during my care of the patient were reviewed by me and considered in my medical decision making (see chart for details).  Medications Ordered in ED Medications  nitroGLYCERIN 50 mg in dextrose 5 % 250 mL (0.2 mg/mL) infusion (5 mcg/min Intravenous New Bag/Given  07/25/19 0228)  nitroGLYCERIN (NITROGLYN) 2 % ointment 1 inch (1 inch Topical Given 07/25/19 0043)                                                                                                                                    Procedures .Critical Care Performed by: Fatima Blank, MD Authorized by: Fatima Blank, MD    CRITICAL CARE Performed by: Grayce Sessions Gerrod Maule Total critical care time: 30 minutes Critical care time was exclusive of separately billable procedures and treating other patients. Critical care was necessary to treat or prevent imminent or life-threatening deterioration. Critical care was time spent personally by me on the following activities: development of treatment plan with patient and/or surrogate as well as nursing, discussions with consultants, evaluation of patient's response to treatment, examination of patient, obtaining history from patient or surrogate, ordering and performing treatments and interventions, ordering and review of laboratory studies, ordering and review of radiographic studies, pulse oximetry and re-evaluation of patient's condition.    (including critical care time)  Medical Decision Making / ED Course I have reviewed the nursing notes for this encounter and the patient's prior records (if available in EHR or on provided paperwork).   Maurice Bell was evaluated in Emergency Department on 07/25/2019 for the symptoms described in the history of present illness. He was evaluated in the context of the global COVID-19 pandemic, which necessitated consideration that the patient might be at risk for infection with  the SARS-CoV-2 virus that causes COVID-19. Institutional protocols and algorithms that pertain to the evaluation of patients at risk for COVID-19 are in a state of rapid change based on information released by regulatory bodies including the CDC and federal and state organizations. These policies and algorithms were followed during the patient's care in the ED.  Respiratory distress Patient is hypertensive Hypoxic on room air Initial presentation concerning for hypertensive emergency related to volume overload with pulmonary edema. Chest x-ray consistent with pulmonary edema. Patient satting 100% on nonrebreather Nitroglycerin paste applied. Labs with reassuring potassium level. Patient will require urgent dialysis given hypoxia and pulmonary edema.  Consulting nephrology Placed on nitroglycerin drip. Will admit to medicine for continued management.      Final Clinical Impression(s) / ED Diagnoses Final diagnoses:  SOB (shortness of breath)  Hypertensive emergency  Acute pulmonary edema ([place])      This chart was dictated using voice recognition software.  Despite best efforts to proofread,  errors can occur which can change the documentation meaning.   Fatima Blank, MD 07/25/19 [phone]

## 2019-07-25 NOTE — Procedures (Signed)
Patient was seen on dialysis and the procedure was supervised.  BFR 400  Via AVF BP is  179/108, UF goal 5-6 kg as tolerated.Patient appears to be tolerating treatment well.  Maurice Bell [name] 07/25/2019

## 2019-07-25 NOTE — ED Triage Notes (Addendum)
Pt arrived via [facility] c/o acute dyspnea x1 hour w/ cough, SPO2 86% RA. Pt is A&Ox4. Oxygen therapy initiated by EMS via NRB at flow rate of 15 lpm. Pt advised of PMHX of CKD and CHF. Dialysis Tuesday/Thursday/Saturday. Rales noted by EMS.

## 2019-07-25 NOTE — H&P (Signed)
History and Physical    Maurice Bell [id number] DOB: 03-02-1956 DOA: 07/25/2019  PCP: Patient, No Pcp Per Patient coming from: Home  Chief Complaint: Shortness of breath  HPI: Maurice Bell is a 63 y.o. male with medical history significant of anemia, anxiety, ESRD on HD TTS, hypertension, CVA presenting to the hospital via EMS for evaluation of acute onset shortness of breath.  SPO2 86% on room air per EMS and placed on 15 L oxygen via nonrebreather.  Patient states last night while watching television he experienced acute onset shortness of breath and substernal chest tightness.  States his last dialysis session was on Saturday.  Does endorse increased dietary fluid intake.  States he drinks a lot more fluid than what he is supposed to be drinking and drinks coffee all the time.  States his breathing and chest tightness have now improved.  Reports compliance with home blood pressure medications.  No additional history could be obtained from him at this time.  ED Course: Blood pressure significantly elevated with systolic up to [id number] recorded.  Afebrile and no leukocytosis.  Hemoglobin 11.8, above baseline.  Sodium 130, potassium 5.2, bicarb 25, anion gap 16.  SARS-CoV-2 test pending.  Chest x-ray showing cardiomegaly and bilateral mid to lower lung field predominant interstitial and hazy airspace densities most consistent with edema, pneumonia not excluded. Patient received nitroglycerin 2% ointment and was started on nitroglycerin infusion.  Review of Systems:  All systems reviewed and apart from history of presenting illness, are negative.  Past Medical History:  Diagnosis Date  . Anemia   . Anxiety   . Arthritis   . Depression   . Dyspnea    "only when I have too much fluid"  . ESRD (end stage renal disease) on dialysis ([place])    "[place]. [place]; TTS" (10/06/2018)  . GERD (gastroesophageal reflux disease)   . Hepatitis C    "never treated"   . History of anemia due to CKD   .  Hypertension   . Myoclonic jerking   . Pneumonia   . Renal disorder   . Stroke ([place])    bilat leg weakness residual- "years ago" - has weakness at times  . Wears glasses   . Wears partial dentures    lower    Past Surgical History:  Procedure Laterality Date  . A/V FISTULAGRAM N/A 03/24/2019   Procedure: A/V FISTULAGRAM - right arm;  Surgeon: Angelia Mould, MD;  Location: [facility];  Service: Cardiovascular;  Laterality: N/A;  . ANTERIOR CERVICAL DECOMPRESSION/DISCECTOMY FUSION 4 LEVEL/HARDWARE REMOVAL N/A 05/15/2019   Procedure: REMOVAL OF C3-C7 PLATE, REVISION C3-4 FUSION WITH PARTIAL CORPECTOMY AT C3 AND REPLATING;  Surgeon: Marybelle Killings, MD;  Location: [place];  Service: Orthopedics;  Laterality: N/A;  . ANTERIOR CERVICAL DECOMPRESSION/DISCECTOMY FUSION 4 LEVELS N/A 05/03/2019   Procedure: C3-4, C4-5, C5-6, C6-7 ANTERIOR CERVICAL DECOMPRESSION/DISCECTOMY FUSION, ALLOGRAFTS & PLATE;  Surgeon: Marybelle Killings, MD;  Location: [place];  Service: Orthopedics;  Laterality: N/A;  . AV FISTULA PLACEMENT Bilateral    "right side not working anymore" (10/06/2018)  . AV FISTULA PLACEMENT Right 02/15/2019   Procedure: Creation of arteriovenous fistula, right arm;  Surgeon: Serafina Mitchell, MD;  Location: [place];  Service: Vascular;  Laterality: Right;  . BASCILIC VEIN TRANSPOSITION Right 03/29/2019   Procedure: SECOND STAGE BASILIC VEIN TRANSPOSITION RIGHT ARM;  Surgeon: Serafina Mitchell, MD;  Location: [place];  Service: Vascular;  Laterality: Right;  . COLON RESECTION  from [place]  . COLONOSCOPY W/ BIOPSIES AND POLYPECTOMY    . HERNIA REPAIR    . INSERTION OF DIALYSIS CATHETER Left 11/07/2018   Procedure: INSERTION OF TUNNELED DIALYSIS CATHETER - LEFT INTERNAL JUGULAR PLACEMENT;  Surgeon: Angelia Mould, MD;  Location: [facility] [place];  Service: Vascular;  Laterality: Left;  . POSTERIOR CERVICAL FUSION/FORAMINOTOMY N/A 05/15/2019   Procedure: POSTERIOR CERVICAL FUSION/FORAMINOTOMY C3-7 WITH  LATERAL MASS INSTRUMENTATION;  Surgeon: Marybelle Killings, MD;  Location: [place];  Service: Orthopedics;  Laterality: N/A;  . TONSILLECTOMY    . UMBILICAL HERNIA REPAIR       reports that he has never smoked. He has never used smokeless tobacco. He reports current alcohol use of about 1.0 standard drinks of alcohol per week. He reports that he does not use drugs.  Allergies  Allergen Reactions  . Vicodin [Hydrocodone-Acetaminophen] Itching    Family History  Problem Relation Age of Onset  . High blood pressure Other     Prior to Admission medications   Medication Sig Start Date End Date Taking? Authorizing Provider  acetaminophen (TYLENOL 8 HOUR ARTHRITIS PAIN) 650 MG CR tablet Take 1,300 mg by mouth every 8 (eight) hours as needed for pain.    [provider]  amLODipine (NORVASC) 10 MG tablet Take 1 tablet (10 mg total) by mouth at bedtime. Patient taking differently: Take 10 mg by mouth daily.  05/03/18   [name], PA-C  cloNIDine (CATAPRES) 0.1 MG tablet Take 1 tablet (0.1 mg total) by mouth 2 (two) times daily. Patient taking differently: Take 0.1 mg by mouth 3 (three) times daily.  05/03/18   [name], PA-C  hydrALAZINE (APRESOLINE) 50 MG tablet Take 50 mg by mouth 2 (two) times a day.  12/27/18   [provider]  Menthol-Methyl Salicylate (ICY HOT) Q000111Q % STCK Apply 1 application topically as needed (joint pain).    [provider]  metoprolol tartrate (LOPRESSOR) 50 MG tablet Take 150 mg by mouth 2 (two) times daily. 12/13/18   [provider]  multivitamin (RENA-VIT) TABS tablet Take 1 tablet by mouth at bedtime. 09/11/18   [name], [name], MD  oxyCODONE-acetaminophen (PERCOCET) 5-325 MG tablet Take 1-2 tablets by mouth every 4 (four) hours as needed for severe pain. Patient not taking: Reported on 06/14/2019 05/18/19 05/17/20  Marybelle Killings, MD  oxyCODONE-acetaminophen (PERCOCET/ROXICET) 5-325 MG tablet Take 1 tablet by mouth every 8  (eight) hours as needed for severe pain. 06/14/19   Marybelle Killings, MD  sevelamer carbonate (RENVELA) 800 MG tablet Take 4 tablets (3,200 mg total) by mouth 3 (three) times daily with meals. Patient taking differently: Take 3,200 mg by mouth See admin instructions. Take 3200 mg by mouth three times daily with meals and 1600 mg with snacks 09/11/18   [name], [name], MD  sodium zirconium cyclosilicate (LOKELMA) 10 g PACK packet Take 10 g by mouth every Monday.     [provider]  thiamine 100 MG tablet Take 1 tablet (100 mg total) by mouth daily. 09/14/18   [name], [name], MD  valACYclovir (VALTREX) 500 MG tablet Take 1 tablet (500 mg total) by mouth daily. 05/29/19   [name], NP  vitamin B-12 2000 MCG tablet Take 1 tablet (2,000 mcg total) by mouth daily. Patient not taking: Reported on 05/15/2019 09/12/18   Mendel Corning, MD    Physical Exam: Vitals:   07/25/19 0227 07/25/19 0230 07/25/19 0300 07/25/19 0330  BP: Marland Kitchen)  173/99 (!) 185/101 (!) 182/102 (!) 196/107  Pulse:  77    Resp: 17 17 16 15   Temp:      TempSrc:      SpO2:  100%    Weight:      Height:        Physical Exam  Constitutional: He is oriented to person, place, and time. He appears well-developed and well-nourished. No distress.  HENT:  Head: Normocephalic.  Eyes: Right eye exhibits no discharge. Left eye exhibits no discharge.  Neck: Neck supple.  Cardiovascular: Normal rate, regular rhythm and intact distal pulses.  Pulmonary/Chest: He has no wheezes.  Slightly tachypneic Speaking clearly in full sentences Rales up to mid lung fields bilaterally  Abdominal: Soft. Bowel sounds are normal. He exhibits no distension. There is no abdominal tenderness. There is no guarding.  Musculoskeletal:        General: Edema present.     Comments: +2 pitting edema of bilateral lower extremities  Neurological: He is alert and oriented to person, place, and time.  Skin: Skin is warm and dry. He is not  diaphoretic.     Labs on Admission: I have personally reviewed following labs and imaging studies  CBC: Recent Labs  Lab 07/25/19 0020 07/25/19 0310  WBC 6.2  --   NEUTROABS 4.6  --   HGB 11.8* 11.2*  HCT 36.9* 33.0*  MCV 109.2*  --   PLT 190  --    Basic Metabolic Panel: Recent Labs  Lab 07/25/19 0020 07/25/19 0310  NA 130* 128*  K 5.2* 5.1  CL 89*  --   CO2 25  --   GLUCOSE 101*  --   BUN 39*  --   CREATININE 8.95*  --   CALCIUM 9.9  --   PHOS 3.7  --    GFR: Estimated Creatinine Clearance: 7.2 mL/min (A) (by C-G formula based on SCr of 8.95 mg/dL (H)). Liver Function Tests: Recent Labs  Lab 07/25/19 0020  ALBUMIN 4.3   No results for input(s): LIPASE, AMYLASE in the last 168 hours. No results for input(s): AMMONIA in the last 168 hours. Coagulation Profile: No results for input(s): INR, PROTIME in the last 168 hours. Cardiac Enzymes: No results for input(s): CKTOTAL, CKMB, CKMBINDEX, TROPONINI in the last 168 hours. BNP (last 3 results) No results for input(s): PROBNP in the last 8760 hours. HbA1C: No results for input(s): HGBA1C in the last 72 hours. CBG: No results for input(s): GLUCAP in the last 168 hours. Lipid Profile: No results for input(s): CHOL, HDL, LDLCALC, TRIG, CHOLHDL, LDLDIRECT in the last 72 hours. Thyroid Function Tests: No results for input(s): TSH, T4TOTAL, FREET4, T3FREE, THYROIDAB in the last 72 hours. Anemia Panel: No results for input(s): VITAMINB12, FOLATE, FERRITIN, TIBC, IRON, RETICCTPCT in the last 72 hours. Urine analysis: No results found for: COLORURINE, APPEARANCEUR, LABSPEC, PHURINE, GLUCOSEU, HGBUR, BILIRUBINUR, KETONESUR, PROTEINUR, UROBILINOGEN, NITRITE, LEUKOCYTESUR  Radiological Exams on Admission: Dg Chest Port 1 View  Result Date: 07/25/2019 CLINICAL DATA:  63 year old male with shortness of breath. EXAM: PORTABLE CHEST 1 VIEW COMPARISON:  Chest radiograph dated 04/26/2019 FINDINGS: Cardiomegaly, new since the  prior radiograph. Bilateral mid to lower lung field predominant interstitial and hazy airspace densities most consistent with edema. Pneumonia is not excluded. Clinical correlation is recommended. Small pleural effusions may be present. No pneumothorax. No acute osseous pathology. IMPRESSION: Cardiomegaly with findings of CHF or fluid overload. Pneumonia is not excluded. Clinical correlation is recommended. Electronically Signed   By: Milas Hock  Radparvar M.D.   On: 07/25/2019 01:20    EKG: Independently reviewed.  Sinus rhythm.  Incomplete right bundle branch block, T wave inversions in lateral leads, and PVCs are new compared to prior tracing.  Assessment/Plan Principal Problem:   Volume overload Active Problems:   Hypertensive urgency   Abnormal EKG   Acute respiratory failure with hypoxia (HCC)   Acute hypoxic respiratory failure secondary to volume overload in the setting of ESRD on HD Mildly tachypneic.  SPO2 86% on room air and currently requiring 12 L supplemental oxygen via nonrebreather.  ABG showing pH 7.38, PCO2 47, and PO2 134.  Chest x-ray showing cardiomegaly and bilateral mid to lower lung field predominant interstitial and hazy airspace densities most consistent with edema, pneumonia not excluded.  Pneumonia less likely given no fever or leukocytosis. -Nephrology has been consulted and patient will be taken for dialysis in the morning. -SARS-CoV-2 test pending.  Continue airborne and contact precautions. -Continue supplemental oxygen -Continuous pulse ox -Continue nitroglycerin infusion for significantly elevated blood pressure  Hypertensive urgency secondary to volume overload in the setting of ESRD on HD Blood pressure significantly elevated with systolic above [id number].  Received Nitropaste and started on nitroglycerin infusion. -Continue nitroglycerin infusion -Nephrology has been consulted for dialysis  Abnormal EKG EKG with incomplete RBBB and T wave inversions in lateral  leads which are new compared to prior tracing.  Patient does endorse chest tightness which has now improved after he received nitroglycerin.  Suspect EKG changes are related to demand ischemia from significantly elevated blood pressure/volume overload. -Cardiac monitoring -Check troponin level and trend  DVT prophylaxis: Subcutaneous heparin Code Status: Patient wishes to be full code. Family Communication: No family available. Disposition Plan: Anticipate discharge after clinical improvement. Consults called: Nephrology (Dr. Moshe Cipro) Admission status: It is my clinical opinion that admission to INPATIENT is reasonable and necessary in this 63 y.o. male . presenting with symptoms of dyspnea and significantly elevated blood pressure secondary to volume overload in the setting of ESRD on HD.  Will need serial hemodialysis.  Given the aforementioned, the predictability of an adverse outcome is felt to be significant. I expect that the patient will require at least 2 midnights in the hospital to treat this condition.   The medical decision making on this patient was of high complexity and the patient is at high risk for clinical deterioration, therefore this is a level 3 visit.  Shela Leff MD [facility] Pager [phone]  If 7PM-7AM, please contact night-coverage [email or web address] Password [facility]  07/25/2019, 5:23 AM

## 2019-07-25 NOTE — ED Notes (Signed)
Attempted to give report and the charge nurse said she had to call the [facility] to talk about the pt

## 2019-07-25 NOTE — ED Notes (Signed)
Pt is NSR on monitor 

## 2019-07-25 NOTE — ED Notes (Signed)
Patient highly aggravated and verbally aggressive in hallway area. This RN asked patient what he needed and how could I be of assistance. Patient stated is refuses to stay any longer and his family is outside waiting to pick him up. This RN explained to patient that his bed had been assigned upstairs. Patient stated, "it is too late now I"m leaving." This RN attempted to have patient sign AMA form. Pt stated, "Hell no I'm not signing anything for you, this is your fault for making me wait all day." This RN educated patient on importance of staying and risk of further decline of health and possibly death with decision to leave AMA. Pt stated, "I don't care I'm leaving. Get me a wheelchair now." AMA form signed with another nurse. [facility] hospitalist to be paged of patient's decision by this RN.

## 2019-07-25 NOTE — ED Notes (Signed)
Stuck pt.x2 no blood return notified nurse ben rn.

## 2019-07-25 NOTE — Progress Notes (Signed)
PROGRESS NOTE        PATIENT DETAILS Name: Maurice Bell Age: 63 y.o. Sex: male Date of Birth: 21-Dec-1955 Admit Date: 07/25/2019 Admitting Physician No admitting provider for patient encounter. BP:[id number], No Pcp Per  Brief Narrative: Patient is a 63 y.o. male with history of ESRD on HD TTS, chronic back pain s/p cervical discectomy in July 2020, HTN presenting with acute hypoxemic respiratory failure secondary to pulmonary edema due to fluid overload in the setting of ESRD (per patient he has been cutting his HD sessions short due to back pain).  Patient was seen by by nephrology-with plans for urgent HD.  See below for further details  Subjective: Seen in HD-breathing already improved.  Complains of back pain.  Assessment/Plan: Acute hypoxic respiratory failure secondary to pulmonary edema in the setting of ESRD: Resolving with urgent HD-titrate off oxygen as tolerated.  Claims that he has been cutting his HD sessions short-due to worsening back pain when he sits up in the HD chair.  Hypertensive emergency: BP already significantly improved while on HD-titrate off on nitroglycerin infusion-resume amlodipine and metoprolol.  Hyponatremia: Secondary to volume overload-should improve with HD.  Chronic back pain: Patient is s/p cervical discectomy in July-he claims that he still needs surgery in his lumbar spine area.  He appears very uncomfortable-claims that because of back pain he is unable to complete HD sessions in the outpatient setting.  Starting PRN narcotics.  Diet: Diet Order            Diet NPO time specified  Diet effective now               DVT Prophylaxis: Prophylactic  Heparin   Code Status: Full code   Family Communication: None at bedside  Disposition Plan: Remain inpatient  Antimicrobial agents: Anti-infectives (From admission, onward)   None      Procedures: None  CONSULTS:  nephrology  Time spent: 25-  minutes-Greater than 50% of this time was spent in counseling, explanation of diagnosis, planning of further management, and coordination of care.  MEDICATIONS: Scheduled Meds: . oxyCODONE      . Chlorhexidine Gluconate Cloth  6 each Topical Q0600  . heparin  5,000 Units Subcutaneous Q8H   Continuous Infusions: . sodium chloride    . sodium chloride    . nitroGLYCERIN 5 mcg/min (07/25/19 0228)   PRN Meds:.sodium chloride, sodium chloride, acetaminophen **OR** acetaminophen, heparin, HYDROmorphone (DILAUDID) injection, lidocaine (PF), lidocaine-prilocaine, oxyCODONE, pentafluoroprop-tetrafluoroeth, sodium chloride flush   PHYSICAL EXAM: Vital signs: Vitals:   07/25/19 0845 07/25/19 0900 07/25/19 0930 07/25/19 1000  BP: (!) 180/112 (!) 165/75 (!) 168/99 (!) 133/93  Pulse: 84 85 87 82  Resp:      Temp:      TempSrc:      SpO2:      Weight:      Height:       Filed Weights   07/25/19 0019  Weight: 58 kg   Body mass index is 21.28 kg/m.   Gen Exam:Alert awake-not in any distress HEENT:atraumatic, normocephalic Chest: B/L clear to auscultation anteriorly CVS:S1S2 regular Abdomen:soft non tender, non distended Extremities:no edema Neurology: Non focal Skin: no rash  I have personally reviewed following labs and imaging studies  LABORATORY DATA: CBC: Recent Labs  Lab 07/25/19 0020 07/25/19 0310  WBC 6.2  --   NEUTROABS 4.6  --  HGB 11.8* 11.2*  HCT 36.9* 33.0*  MCV 109.2*  --   PLT 190  --     Basic Metabolic Panel: Recent Labs  Lab 07/25/19 0020 07/25/19 0310 07/25/19 0554  NA 130* 128* 129*  K 5.2* 5.1 5.1  CL 89*  --  89*  CO2 25  --  25  GLUCOSE 101*  --  105*  BUN 39*  --  41*  CREATININE 8.95*  --  9.27*  CALCIUM 9.9  --  9.3  PHOS 3.7  --  4.3    GFR: Estimated Creatinine Clearance: 7 mL/min (A) (by C-G formula based on SCr of 9.27 mg/dL (H)).  Liver Function Tests: Recent Labs  Lab 07/25/19 0020 07/25/19 0554  ALBUMIN 4.3 3.7    No results for input(s): LIPASE, AMYLASE in the last 168 hours. No results for input(s): AMMONIA in the last 168 hours.  Coagulation Profile: No results for input(s): INR, PROTIME in the last 168 hours.  Cardiac Enzymes: No results for input(s): CKTOTAL, CKMB, CKMBINDEX, TROPONINI in the last 168 hours.  BNP (last 3 results) No results for input(s): PROBNP in the last 8760 hours.  HbA1C: No results for input(s): HGBA1C in the last 72 hours.  CBG: No results for input(s): GLUCAP in the last 168 hours.  Lipid Profile: No results for input(s): CHOL, HDL, LDLCALC, TRIG, CHOLHDL, LDLDIRECT in the last 72 hours.  Thyroid Function Tests: No results for input(s): TSH, T4TOTAL, FREET4, T3FREE, THYROIDAB in the last 72 hours.  Anemia Panel: No results for input(s): VITAMINB12, FOLATE, FERRITIN, TIBC, IRON, RETICCTPCT in the last 72 hours.  Urine analysis: No results found for: COLORURINE, APPEARANCEUR, LABSPEC, PHURINE, GLUCOSEU, HGBUR, BILIRUBINUR, KETONESUR, PROTEINUR, UROBILINOGEN, NITRITE, LEUKOCYTESUR  Sepsis Labs: Lactic Acid, Venous    Component Value Date/Time   LATICACIDVEN 2.4 ([place]) 03/12/2019 2008    MICROBIOLOGY: Recent Results (from the past 240 hour(s))  SARS Coronavirus 2 [facility] order, Performed in [facility] hospital lab) Nasopharyngeal Nasopharyngeal Swab     Status: None   Collection Time: 07/25/19  3:59 AM   Specimen: Nasopharyngeal Swab  Result Value Ref Range Status   SARS Coronavirus 2 NEGATIVE NEGATIVE Final    Comment: (NOTE) If result is NEGATIVE SARS-CoV-2 target nucleic acids are NOT DETECTED. The SARS-CoV-2 RNA is generally detectable in upper and lower  respiratory specimens during the acute phase of infection. The lowest  concentration of SARS-CoV-2 viral copies this assay can detect is 250  copies / mL. A negative result does not preclude SARS-CoV-2 infection  and should not be used as the sole basis for treatment or other  patient  management decisions.  A negative result may occur with  improper specimen collection / handling, submission of specimen other  than nasopharyngeal swab, presence of viral mutation(s) within the  areas targeted by this assay, and inadequate number of viral copies  (<250 copies / mL). A negative result must be combined with clinical  observations, patient history, and epidemiological information. If result is POSITIVE SARS-CoV-2 target nucleic acids are DETECTED. The SARS-CoV-2 RNA is generally detectable in upper and lower  respiratory specimens dur ing the acute phase of infection.  Positive  results are indicative of active infection with SARS-CoV-2.  Clinical  correlation with patient history and other diagnostic information is  necessary to determine patient infection status.  Positive results do  not rule out bacterial infection or co-infection with other viruses. If result is PRESUMPTIVE POSTIVE SARS-CoV-2 nucleic acids MAY BE PRESENT.  A presumptive positive result was obtained on the submitted specimen  and confirmed on repeat testing.  While 2019 novel coronavirus  (SARS-CoV-2) nucleic acids may be present in the submitted sample  additional confirmatory testing may be necessary for epidemiological  and / or clinical management purposes  to differentiate between  SARS-CoV-2 and other Sarbecovirus currently known to infect humans.  If clinically indicated additional testing with an alternate test  methodology [phone]) is advised. The SARS-CoV-2 RNA is generally  detectable in upper and lower respiratory sp ecimens during the acute  phase of infection. The expected result is Negative. Fact Sheet for Patients:  StrictlyIdeas.no Fact Sheet for Healthcare Providers: BankingDealers.co.za This test is not yet approved or cleared by the [place] FDA and has been authorized for detection and/or diagnosis of SARS-CoV-2 by FDA under  an Emergency Use Authorization (EUA).  This EUA will remain in effect (meaning this test can be used) for the duration of the COVID-19 declaration under Section 564(b)(1) of the Act, 21 U.S.C. section 360bbb-3(b)(1), unless the authorization is terminated or revoked sooner. Performed at [facility], [place] [address] [place], [place] [address]     RADIOLOGY STUDIES/RESULTS: Dg Chest Port 1 View  Result Date: 07/25/2019 CLINICAL DATA:  63 year old male with shortness of breath. EXAM: PORTABLE CHEST 1 VIEW COMPARISON:  Chest radiograph dated 04/26/2019 FINDINGS: Cardiomegaly, new since the prior radiograph. Bilateral mid to lower lung field predominant interstitial and hazy airspace densities most consistent with edema. Pneumonia is not excluded. Clinical correlation is recommended. Small pleural effusions may be present. No pneumothorax. No acute osseous pathology. IMPRESSION: Cardiomegaly with findings of CHF or fluid overload. Pneumonia is not excluded. Clinical correlation is recommended. Electronically Signed   By: Anner Crete M.D.   On: 07/25/2019 01:20     LOS: 0 days   Oren Binet, MD  [facility]  If 7PM-7AM, please contact night-coverage  Please page via [email or web address]  Go to [email or web address] and use [facility]'s universal password to access. If you do not have the password, please contact the hospital operator.  Locate the [facility] provider you are looking for under [facility] and page to a number that you can be directly reached. If you still have difficulty reaching the provider, please page the [facility] (Director on Call) for the Hospitalists listed on amion for assistance.  07/25/2019, 10:36 AM

## 2019-07-25 NOTE — ED Notes (Signed)
[facility] floor coverage paged and notified of patient's decision to leave AMA.

## 2019-07-25 NOTE — ED Notes (Signed)
Pt taken to dialysis before 0700
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
## 2019-07-25 NOTE — ED Notes (Signed)
Pt won't keep heart monitor leads on, bp, or Sa02 on, pt keeps getting up and walking around the room stating he's in too much pain.

## 2019-07-26 NOTE — Discharge Summary (Signed)
PATIENT DETAILS Name: Maurice Bell Age: 63 y.o. Sex: male Date of Birth: 03-22-1956 MRN: [id number]. Admitting Physician: No admitting provider for patient encounter. BP:[id number], No Pcp Per  Admit Date: 07/25/2019 Discharge date: 07/26/2019  Note:patient left AMA  Recommendations for Outpatient Follow-up:  1. Will probably require referral to pain management-as patient has significant back pain. 2. Continue counseling regarding compliance with hemodialysis  PRIMARY DISCHARGE DIAGNOSIS:  Principal Problem:   Volume overload Active Problems:   Hypertensive urgency   Abnormal EKG   Acute respiratory failure with hypoxia (HCC)      PAST MEDICAL HISTORY: Past Medical History:  Diagnosis Date  . Anemia   . Anxiety   . Arthritis   . Depression   . Dyspnea    "only when I have too much fluid"  . ESRD (end stage renal disease) on dialysis ([place])    "[place]; TTS" (10/06/2018)  . GERD (gastroesophageal reflux disease)   . Hepatitis C    "never treated"   . History of anemia due to CKD   . Hypertension   . Myoclonic jerking   . Pneumonia   . Renal disorder   . Stroke ([place])    bilat leg weakness residual- "years ago" - has weakness at times  . Wears glasses   . Wears partial dentures    lower    ALLERGIES:   Allergies  Allergen Reactions  . Vicodin [Hydrocodone-Acetaminophen] Itching    BRIEF HPI:  See H&P, Labs, Consult and Test reports for all details in brief, Patient is a 63 y.o. male with history of ESRD on HD TTS, chronic back pain s/p cervical discectomy in July 2020, HTN presenting with acute hypoxemic respiratory failure secondary to pulmonary edema due to fluid overload in the setting of ESRD (per patient he has been cutting his HD sessions short due to back pain).  Patient was seen by by nephrology-with plans for urgent HD.  See below for further details  CONSULTATIONS:   nephrology  PERTINENT RADIOLOGIC STUDIES: Dg Chest Port 1 View  Result  Date: 07/25/2019 CLINICAL DATA:  63 year old male with shortness of breath. EXAM: PORTABLE CHEST 1 VIEW COMPARISON:  Chest radiograph dated 04/26/2019 FINDINGS: Cardiomegaly, new since the prior radiograph. Bilateral mid to lower lung field predominant interstitial and hazy airspace densities most consistent with edema. Pneumonia is not excluded. Clinical correlation is recommended. Small pleural effusions may be present. No pneumothorax. No acute osseous pathology. IMPRESSION: Cardiomegaly with findings of CHF or fluid overload. Pneumonia is not excluded. Clinical correlation is recommended. Electronically Signed   By: Anner Crete M.D.   On: 07/25/2019 01:20     PERTINENT LAB RESULTS: CBC: Recent Labs    07/25/19 0020 07/25/19 0310  WBC 6.2  --   HGB 11.8* 11.2*  HCT 36.9* 33.0*  PLT 190  --    CMET CMP     Component Value Date/Time   NA 129 (L) 07/25/2019 0554   NA 141 10/19/2018 1106   K 5.1 07/25/2019 0554   CL 89 (L) 07/25/2019 0554   CO2 25 07/25/2019 0554   GLUCOSE 105 (H) 07/25/2019 0554   BUN 41 (H) 07/25/2019 0554   BUN 43 (H) 10/19/2018 1106   CREATININE 9.27 (H) 07/25/2019 0554   CALCIUM 9.3 07/25/2019 0554   CALCIUM 9.1 09/07/2018 2249   PROT 7.5 04/26/2019 1453   PROT 7.1 05/16/2018 0830   ALBUMIN 3.7 07/25/2019 0554   ALBUMIN 4.2 05/16/2018 0830   AST 31 04/26/2019 1453  ALT 24 04/26/2019 1453   ALKPHOS 59 04/26/2019 1453   BILITOT 1.8 (H) 04/26/2019 1453   BILITOT 0.3 05/16/2018 0830   GFRNONAA 6 (L) 07/25/2019 0554   GFRAA 6 (L) 07/25/2019 0554    GFR Estimated Creatinine Clearance: 6.5 mL/min (A) (by C-G formula based on SCr of 9.27 mg/dL (H)). No results for input(s): LIPASE, AMYLASE in the last 72 hours. No results for input(s): CKTOTAL, CKMB, CKMBINDEX, TROPONINI in the last 72 hours. Invalid input(s): POCBNP No results for input(s): DDIMER in the last 72 hours. No results for input(s): HGBA1C in the last 72 hours. No results for input(s):  CHOL, HDL, LDLCALC, TRIG, CHOLHDL, LDLDIRECT in the last 72 hours. No results for input(s): TSH, T4TOTAL, T3FREE, THYROIDAB in the last 72 hours.  Invalid input(s): FREET3 No results for input(s): VITAMINB12, FOLATE, FERRITIN, TIBC, IRON, RETICCTPCT in the last 72 hours. Coags: No results for input(s): INR in the last 72 hours.  Invalid input(s): PT Microbiology: Recent Results (from the past 240 hour(s))  SARS CORONAVIRUS 2 (TAT 6-24 HRS) Nasopharyngeal Nasopharyngeal Swab     Status: None   Collection Time: 07/25/19 12:48 AM   Specimen: Nasopharyngeal Swab  Result Value Ref Range Status   SARS Coronavirus 2 NEGATIVE NEGATIVE Final    Comment: (NOTE) SARS-CoV-2 target nucleic acids are NOT DETECTED. The SARS-CoV-2 RNA is generally detectable in upper and lower respiratory specimens during the acute phase of infection. Negative results do not preclude SARS-CoV-2 infection, do not rule out co-infections with other pathogens, and should not be used as the sole basis for treatment or other patient management decisions. Negative results must be combined with clinical observations, patient history, and epidemiological information. The expected result is Negative. Fact Sheet for Patients: SugarRoll.be Fact Sheet for Healthcare Providers: https://www.woods-mathews.com/ This test is not yet approved or cleared by the [place] FDA and  has been authorized for detection and/or diagnosis of SARS-CoV-2 by FDA under an Emergency Use Authorization (EUA). This EUA will remain  in effect (meaning this test can be used) for the duration of the COVID-19 declaration under Section 56 4(b)(1) of the Act, 21 U.S.C. section 360bbb-3(b)(1), unless the authorization is terminated or revoked sooner. Performed at [facility], [place] [address], [place], [place] [address]   SARS Coronavirus 2 [facility] order, Performed in [facility] hospital lab)  Nasopharyngeal Nasopharyngeal Swab     Status: None   Collection Time: 07/25/19  3:59 AM   Specimen: Nasopharyngeal Swab  Result Value Ref Range Status   SARS Coronavirus 2 NEGATIVE NEGATIVE Final    Comment: (NOTE) If result is NEGATIVE SARS-CoV-2 target nucleic acids are NOT DETECTED. The SARS-CoV-2 RNA is generally detectable in upper and lower  respiratory specimens during the acute phase of infection. The lowest  concentration of SARS-CoV-2 viral copies this assay can detect is 250  copies / mL. A negative result does not preclude SARS-CoV-2 infection  and should not be used as the sole basis for treatment or other  patient management decisions.  A negative result may occur with  improper specimen collection / handling, submission of specimen other  than nasopharyngeal swab, presence of viral mutation(s) within the  areas targeted by this assay, and inadequate number of viral copies  (<250 copies / mL). A negative result must be combined with clinical  observations, patient history, and epidemiological information. If result is POSITIVE SARS-CoV-2 target nucleic acids are DETECTED. The SARS-CoV-2 RNA is generally detectable in upper and lower  respiratory specimens dur ing the acute phase of infection.  Positive  results are indicative of active infection with SARS-CoV-2.  Clinical  correlation with patient history and other diagnostic information is  necessary to determine patient infection status.  Positive results do  not rule out bacterial infection or co-infection with other viruses. If result is PRESUMPTIVE POSTIVE SARS-CoV-2 nucleic acids MAY BE PRESENT.   A presumptive positive result was obtained on the submitted specimen  and confirmed on repeat testing.  While 2019 novel coronavirus  (SARS-CoV-2) nucleic acids may be present in the submitted sample  additional confirmatory testing may be necessary for epidemiological  and / or clinical management purposes  to  differentiate between  SARS-CoV-2 and other Sarbecovirus currently known to infect humans.  If clinically indicated additional testing with an alternate test  methodology [phone]) is advised. The SARS-CoV-2 RNA is generally  detectable in upper and lower respiratory sp ecimens during the acute  phase of infection. The expected result is Negative. Fact Sheet for Patients:  StrictlyIdeas.no Fact Sheet for Healthcare Providers: BankingDealers.co.za This test is not yet approved or cleared by the [place] FDA and has been authorized for detection and/or diagnosis of SARS-CoV-2 by FDA under an Emergency Use Authorization (EUA).  This EUA will remain in effect (meaning this test can be used) for the duration of the COVID-19 declaration under Section 564(b)(1) of the Act, 21 U.S.C. section 360bbb-3(b)(1), unless the authorization is terminated or revoked sooner. Performed at [facility], [place] [address], [place], [place] [address]      BRIEF HOSPITAL COURSE:  Acute hypoxic respiratory failure secondary to pulmonary edema in the setting of ESRD:  Improved with urgent HD- he was able to be titrated off oxygen..  Claims that he has been cutting his HD sessions short-due to worsening back pain when he sits up in the HD chair.  Hypertensive emergency: BP already significantly improved after HD-titrated off on nitroglycerin infusion-resume amlodipine and metoprolol.  Hyponatremia: Secondary to volume overload-should improve with HD-plans were to repeat electrolytes the next day-however he signed out against AMA.  Chronic back pain: Patient is s/p cervical discectomy in July-he claims that he still needs surgery in his lumbar spine area.  He appears very uncomfortable-claims that because of back pain he is unable to complete HD sessions in the outpatient setting.    He was managed with as needed narcotics.   TODAY-DAY OF DISCHARGE:   Subjective:   Maurice Bell today has signed out against medical advice. He was warned about the life threatening and life disabling effects by  RN.   Objective:   Blood pressure 135/89, pulse 72, temperature 98.4 F (36.9 C), temperature source Oral, resp. rate 16, height 5\' 5"  (1.651 m), weight 54.3 kg, SpO2 96 %.   DISCHARGE CONDITION: Not stable for discharge-left AMA  DISPOSITION: AMA   Follow with your PCP in 1 week   Total Time spent on discharge equals 25  minutes.  SignedOren Binet 07/26/2019 8:26 AM

## 2019-08-02 ENCOUNTER — Emergency Department (HOSPITAL_COMMUNITY)
Admission: EM | Admit: 2019-08-02 | Discharge: 2019-08-02 | Disposition: A | Discharge status: Home / Self Care | Payer: Medicare HMO | Attending: Emergency Medicine | Admitting: Emergency Medicine

## 2019-08-02 ENCOUNTER — Emergency Department (HOSPITAL_COMMUNITY): Payer: Medicare HMO

## 2019-08-02 ENCOUNTER — Other Ambulatory Visit: Payer: Self-pay

## 2019-08-02 ENCOUNTER — Encounter (HOSPITAL_COMMUNITY): Payer: Self-pay | Admitting: *Deleted

## 2019-08-02 DIAGNOSIS — T82590A Other mechanical complication of surgically created arteriovenous fistula, initial encounter: Secondary | ICD-10-CM | POA: Diagnosis not present

## 2019-08-02 DIAGNOSIS — Z79899 Other long term (current) drug therapy: Secondary | ICD-10-CM | POA: Diagnosis not present

## 2019-08-02 DIAGNOSIS — Z992 Dependence on renal dialysis: Secondary | ICD-10-CM | POA: Diagnosis not present

## 2019-08-02 DIAGNOSIS — Z7901 Long term (current) use of anticoagulants: Secondary | ICD-10-CM | POA: Diagnosis not present

## 2019-08-02 DIAGNOSIS — Z20828 Contact with and (suspected) exposure to other viral communicable diseases: Secondary | ICD-10-CM | POA: Insufficient documentation

## 2019-08-02 DIAGNOSIS — N186 End stage renal disease: Secondary | ICD-10-CM

## 2019-08-02 DIAGNOSIS — Y712 Prosthetic and other implants, materials and accessory cardiovascular devices associated with adverse incidents: Secondary | ICD-10-CM | POA: Insufficient documentation

## 2019-08-02 DIAGNOSIS — T829XXA Unspecified complication of cardiac and vascular prosthetic device, implant and graft, initial encounter: Secondary | ICD-10-CM | POA: Diagnosis present

## 2019-08-02 DIAGNOSIS — I120 Hypertensive chronic kidney disease with stage 5 chronic kidney disease or end stage renal disease: Secondary | ICD-10-CM | POA: Diagnosis not present

## 2019-08-02 DIAGNOSIS — Z03818 Encounter for observation for suspected exposure to other biological agents ruled out: Secondary | ICD-10-CM | POA: Diagnosis not present

## 2019-08-02 HISTORY — PX: IR THROMBECTOMY AV FISTULA W/THROMBOLYSIS/PTA INC/SHUNT/IMG RIGHT: IMG6119

## 2019-08-02 HISTORY — PX: IR US GUIDE VASC ACCESS RIGHT: IMG2390

## 2019-08-02 LAB — TYPE AND SCREEN
ABO/RH(D): O POS
Antibody Screen: NEGATIVE

## 2019-08-02 LAB — BASIC METABOLIC PANEL
Anion gap: 16 — ABNORMAL HIGH (ref 5–15)
BUN: 36 mg/dL — ABNORMAL HIGH (ref 6–20)
CO2: 27 mmol/L (ref 22–32)
Calcium: 9.3 mg/dL (ref 8.9–10.3)
Chloride: 93 mmol/L — ABNORMAL LOW (ref 98–111)
Creatinine, Ser: 6.59 mg/dL — ABNORMAL HIGH (ref 0.61–1.24)
GFR calc Af Amer: 10 mL/min — ABNORMAL LOW (ref >60)
GFR calc non Af Amer: 8 mL/min — ABNORMAL LOW (ref >60)
Glucose, Bld: 92 mg/dL (ref 70–99)
Potassium: 4.3 mmol/L (ref 3.5–5.1)
Sodium: 136 mmol/L (ref 135–145)

## 2019-08-02 LAB — PROTIME-INR
INR: 1.1 (ref 0.8–1.2)
Prothrombin Time: 14.4 seconds (ref 11.4–15.2)

## 2019-08-02 LAB — CBC
HCT: 34.9 % — ABNORMAL LOW (ref 39.0–52.0)
Hemoglobin: 11.3 g/dL — ABNORMAL LOW (ref 13.0–17.0)
MCH: 35.3 pg — ABNORMAL HIGH (ref 26.0–34.0)
MCHC: 32.4 g/dL (ref 30.0–36.0)
MCV: 109.1 fL — ABNORMAL HIGH (ref 80.0–100.0)
Platelets: 202 10*3/uL (ref 150–400)
RBC: 3.2 MIL/uL — ABNORMAL LOW (ref 4.22–5.81)
RDW: 16.8 % — ABNORMAL HIGH (ref 11.5–15.5)
WBC: 6 10*3/uL (ref 4.0–10.5)
nRBC: 0 % (ref 0.0–0.2)

## 2019-08-02 LAB — SARS CORONAVIRUS 2 BY RT PCR (HOSPITAL ORDER, PERFORMED IN ~~LOC~~ HOSPITAL LAB): SARS Coronavirus 2: NEGATIVE

## 2019-08-02 SURGERY — INSERTION OF DIALYSIS CATHETER
Anesthesia: General | Laterality: Right

## 2019-08-02 MED ORDER — ALTEPLASE 2 MG IJ SOLR
INTRAMUSCULAR | Status: AC
  Filled 2019-08-02: qty 4

## 2019-08-02 MED ORDER — ALTEPLASE 2 MG IJ SOLR
INTRAMUSCULAR | Status: AC | PRN
  Administered 2019-08-02: 2 mg

## 2019-08-02 MED ORDER — MIDAZOLAM HCL 2 MG/2ML IJ SOLN
INTRAMUSCULAR | Status: AC | PRN
  Administered 2019-08-02 (×2): 0.5 mg via INTRAVENOUS

## 2019-08-02 MED ORDER — LIDOCAINE HCL 1 % IJ SOLN
INTRAMUSCULAR | Status: AC
  Filled 2019-08-02: qty 20

## 2019-08-02 MED ORDER — MIDAZOLAM HCL 2 MG/2ML IJ SOLN
INTRAMUSCULAR | Status: AC
  Filled 2019-08-02: qty 2

## 2019-08-02 MED ORDER — FENTANYL CITRATE (PF) 100 MCG/2ML IJ SOLN
INTRAMUSCULAR | Status: AC | PRN
  Administered 2019-08-02: 50 ug via INTRAVENOUS
  Administered 2019-08-02: 25 ug via INTRAVENOUS

## 2019-08-02 MED ORDER — FENTANYL CITRATE (PF) 100 MCG/2ML IJ SOLN
INTRAMUSCULAR | Status: AC
  Filled 2019-08-02: qty 2

## 2019-08-02 MED ORDER — LIDOCAINE HCL 1 % IJ SOLN
INTRAMUSCULAR | Status: AC | PRN
  Administered 2019-08-02: 5 mL

## 2019-08-02 MED ORDER — IOHEXOL 300 MG/ML  SOLN
100.0000 mL | Freq: Once | INTRAMUSCULAR | Status: AC | PRN
  Administered 2019-08-02: 11:00:00 50 mL via INTRAVENOUS

## 2019-08-02 MED ORDER — CEFAZOLIN SODIUM-DEXTROSE 2-4 GM/100ML-% IV SOLN: 2.0000 g | INTRAVENOUS | Status: DC

## 2019-08-02 NOTE — ED Triage Notes (Signed)
Pt states HD yesterday and right upper arm fistula fine. Woke up today and noticed no thrill in left upper fistula.  Denies pain. No bruit or thrill present in triage. Radial pulse present.

## 2019-08-02 NOTE — Consult Note (Signed)
[name] MRN: [id number] DOB: [birth year] Sex: male  REASON FOR CONSULT: Clotted right basilic vein transposition  HPI: Maurice Bell is a 63 y.o. male who has a right basilic vein transposition.  He had a first stage basilic vein transposition on the right by Dr. Trula Slade on 02/15/2019.  The patient had elevated velocities in the proximal fistula and was set up for a fistulogram.  He had a fistulogram on 03/24/2019 which showed a widely patent fistula with some mild narrowing just beyond the arterial anastomosis.  He subsequently had a second stage basilic vein transposition by Dr. Trula Slade on 03/29/2019.  He was seen in follow-up in June and was given instructions to begin using the fistula on 05/16/2019.  He states they have only used his fistula a couple of times.  He last ate at 10 PM yesterday.  He dialyzes on Tuesdays Thursdays and Saturdays and dialyzed Tuesday without any problem.  He woke up at 3 AM this morning to get a snack and noticed that his fistula no longer had a thrill.  He came to the emergency department and vascular surgery was consulted.  Current Facility-Administered Medications  Medication Dose Route Frequency Provider Last Rate Last Dose  . [START ON 08/03/2019] ceFAZolin (ANCEF) IVPB 2g/100 mL premix  2 g Intravenous To SSTC Angelia Mould, MD      . sodium chloride flush (NS) 0.9 % injection 3 mL  3 mL Intravenous PRN Marty Heck, MD       Current Outpatient Medications  Medication Sig Dispense Refill  . acetaminophen (TYLENOL 8 HOUR ARTHRITIS PAIN) 650 MG CR tablet Take 1,300 mg by mouth every 8 (eight) hours as needed for pain.    Marland Kitchen amLODipine (NORVASC) 10 MG tablet Take 1 tablet (10 mg total) by mouth at bedtime. (Patient taking differently: Take 10 mg by mouth daily. ) 30 tablet 5  . cloNIDine (CATAPRES) 0.1 MG tablet Take 1 tablet (0.1 mg total) by mouth 2 (two) times daily. (Patient taking differently: Take 0.1 mg by mouth 3  (three) times daily. ) 60 tablet 5  . hydrALAZINE (APRESOLINE) 50 MG tablet Take 50 mg by mouth 2 (two) times a day.     . Menthol-Methyl Salicylate (ICY HOT) Q000111Q % STCK Apply 1 application topically as needed (joint pain).    . metoprolol tartrate (LOPRESSOR) 50 MG tablet Take 150 mg by mouth 2 (two) times daily.    . multivitamin (RENA-VIT) TABS tablet Take 1 tablet by mouth at bedtime. 30 tablet 3  . oxyCODONE-acetaminophen (PERCOCET/ROXICET) 5-325 MG tablet Take 1 tablet by mouth every 8 (eight) hours as needed for severe pain. 40 tablet 0  . sevelamer carbonate (RENVELA) 800 MG tablet Take 4 tablets (3,200 mg total) by mouth 3 (three) times daily with meals. (Patient taking differently: Take 3,200 mg by mouth See admin instructions. Take 3200 mg by mouth three times daily with meals and 1600 mg with snacks) 180 tablet 5  . sodium zirconium cyclosilicate (LOKELMA) 10 g PACK packet Take 10 g by mouth every Monday.     . thiamine 100 MG tablet Take 1 tablet (100 mg total) by mouth daily. 30 tablet 3  . valACYclovir (VALTREX) 500 MG tablet Take 1 tablet (500 mg total) by mouth daily. 30 tablet 0  . vitamin B-12 2000 MCG tablet Take 1 tablet (2,000 mcg total) by mouth daily. 30 tablet 3   REVIEW OF SYSTEMS: Valu.Nieves ] denotes positive finding; [  ]  denotes negative finding  CARDIOVASCULAR:  [ ]  chest pain   [ ]  dyspnea on exertion  [ ]  leg swelling  CONSTITUTIONAL:  [ ]  fever   [ ]  chills   PHYSICAL EXAM: Vitals:   08/02/19 0610 08/02/19 0615 08/02/19 0645 08/02/19 0700  BP:  (!) 174/89 (!) 176/88 (!) 179/97  Pulse: 87     Resp: 15 10 (!) 9 17  Temp:      SpO2: 100%      GENERAL: The patient is a well-nourished male, in no acute distress. The vital signs are documented above. CARDIOVASCULAR: There is a regular rate and rhythm. PULMONARY: There is good air exchange bilaterally without wheezing or rales. His fistula in the right upper arm does not have a bruit or thrill. He has a palpable  right radial pulse.  COVID TEST HAS BEEN ORDERED AND IS PENDING.  Potassium is 4.3.  MEDICAL ISSUES:  CLOTTED RIGHT BASILIC VEIN TRANSPOSITION: I have added him onto the schedule today for thrombectomy of his basilic vein transposition versus placement of a tunneled dialysis catheter.  I have reviewed the indications for the procedure and the potential complications and he is agreeable to proceed.  Deitra Mayo [facility] Beeper: [phone]

## 2019-08-02 NOTE — ED Notes (Signed)
Pt went to IR.

## 2019-08-02 NOTE — ED Notes (Signed)
Pt transported to IR for procedure
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
## 2019-08-02 NOTE — Procedures (Signed)
Interventional Radiology Procedure Note  Procedure: RUE fistula mechanical and pharm thrombectomy with restoration of flow. Treated the outflow with 29mm and 40mm diameter PTA for venous outflow stenosis at the upper humeral region.   <30% residual achieved. Restoration of flow and excellent thrill.   Complications: None  Recommendations:  - Ok to use - Do not submerge - Routine care  - stay sutures may be removed in ~48 hrs at dialysis.   Signed,  Dulcy Fanny. Earleen Newport, DO

## 2019-08-02 NOTE — ED Provider Notes (Signed)
TIME SEEN: 5:34 AM  CHIEF COMPLAINT: no thrill in RUE fistula   HPI: Patient is a 63 year old male with history of end-stage renal disease on hemodialysis Tuesday, Thursday and Saturday who was last dialyzed yesterday, hypertension, CVA who presents to the emergency department concerned that he does not have a thrill in the right upper extremity.  States that he was dialyzed fully yesterday in the fistula was working appropriately with a normal thrill.  Woke up this morning and noticed there was no thrill in his right upper extremity fistula.  He denies fevers, upper extremity pain, numbness or weakness, chest pain or shortness of breath.  ROS: See HPI Constitutional: no fever  Eyes: no drainage  ENT: no runny nose   Cardiovascular:  no chest pain  Resp: no SOB  GI: no vomiting GU: no dysuria Integumentary: no rash  Allergy: no hives  Musculoskeletal: no leg swelling  Neurological: no slurred speech ROS otherwise negative  PAST MEDICAL HISTORY/PAST SURGICAL HISTORY:  Past Medical History:  Diagnosis Date  . Anemia   . Anxiety   . Arthritis   . Depression   . Dyspnea    "only when I have too much fluid"  . ESRD (end stage renal disease) on dialysis ([place])    "[place] [place]; T[place]" (10/06/2018)  . GERD (gastroesophageal reflux disease)   . Hepatitis C    "never treated"   . History of anemia due to CKD   . Hypertension   . Myoclonic jerking   . Pneumonia   . Renal disorder   . Stroke ([place])    bilat leg weakness residual- "years ago" - has weakness at times  . Wears glasses   . Wears partial dentures    lower    MEDICATIONS:  Prior to Admission medications   Medication Sig Start Date End Date Taking? Authorizing Provider  acetaminophen (TYLENOL 8 HOUR ARTHRITIS PAIN) 650 MG CR tablet Take 1,300 mg by mouth every 8 (eight) hours as needed for pain.    [provider]  amLODipine (NORVASC) 10 MG tablet Take 1 tablet (10 mg total) by mouth at bedtime. Patient taking  differently: Take 10 mg by mouth daily.  05/03/18   [name], PA-C  cloNIDine (CATAPRES) 0.1 MG tablet Take 1 tablet (0.1 mg total) by mouth 2 (two) times daily. Patient taking differently: Take 0.1 mg by mouth 3 (three) times daily.  05/03/18   [name], PA-C  hydrALAZINE (APRESOLINE) 50 MG tablet Take 50 mg by mouth 2 (two) times a day.  12/27/18   [provider]  Menthol-Methyl Salicylate (ICY HOT) Q000111Q % STCK Apply 1 application topically as needed (joint pain).    [provider]  metoprolol tartrate (LOPRESSOR) 50 MG tablet Take 150 mg by mouth 2 (two) times daily. 12/13/18   [provider]  multivitamin (RENA-VIT) TABS tablet Take 1 tablet by mouth at bedtime. 09/11/18   [name], [name], MD  oxyCODONE-acetaminophen (PERCOCET/ROXICET) 5-325 MG tablet Take 1 tablet by mouth every 8 (eight) hours as needed for severe pain. 06/14/19   Marybelle Killings, MD  sevelamer carbonate (RENVELA) 800 MG tablet Take 4 tablets (3,200 mg total) by mouth 3 (three) times daily with meals. Patient taking differently: Take 3,200 mg by mouth See admin instructions. Take 3200 mg by mouth three times daily with meals and 1600 mg with snacks 09/11/18   [name], [name], MD  sodium zirconium cyclosilicate (LOKELMA) 10 g PACK packet Take 10 g by  mouth every Monday.     [provider]  thiamine 100 MG tablet Take 1 tablet (100 mg total) by mouth daily. 09/14/18   [name], [name], MD  valACYclovir (VALTREX) 500 MG tablet Take 1 tablet (500 mg total) by mouth daily. 05/29/19   [name], NP  vitamin B-12 2000 MCG tablet Take 1 tablet (2,000 mcg total) by mouth daily. 09/12/18   [name], [name], MD    ALLERGIES:  Allergies  Allergen Reactions  . Vicodin [Hydrocodone-Acetaminophen] Itching    SOCIAL HISTORY:  Social History   Tobacco Use  . Smoking status: Never Smoker  . Smokeless tobacco: Never Used  . Tobacco comment: "smoked as a kid"  Substance Use  Topics  . Alcohol use: Yes    Alcohol/week: 1.0 standard drinks    Types: 1 Standard drinks or equivalent per week    Frequency: Never    Comment: occasional drink    FAMILY HISTORY: Family History  Problem Relation Age of Onset  . High blood pressure Other     EXAM: BP (!) 157/87 (BP Location: Left Arm)   Pulse 93   Temp 98.4 F (36.9 C)   Resp 20   SpO2 100%  CONSTITUTIONAL: Alert and oriented and responds appropriately to questions.  Chronically ill-appearing HEAD: Normocephalic EYES: Conjunctivae clear, pupils appear equal, EOMI ENT: normal nose; moist mucous membranes NECK: Supple, no meningismus, no nuchal rigidity, no LAD  CARD: RRR; S1 and S2 appreciated; no murmurs, no clicks, no rubs, no gallops RESP: Normal chest excursion without splinting or tachypnea; breath sounds clear and equal bilaterally; no wheezes, no rhonchi, no rales, no hypoxia or respiratory distress, speaking full sentences ABD/GI: Normal bowel sounds; non-distended; soft, non-tender, no rebound, no guarding, no peritoneal signs, no hepatosplenomegaly BACK:  The back appears normal and is non-tender to palpation, there is no CVA tenderness EXT: Normal ROM in all joints; non-tender to palpation; no edema; normal capillary refill; no cyanosis, no calf tenderness or swelling, patient has AV fistula in the right forearm in the right proximal arm.  Neither of these fistulas have thrill or bruit present.  He has a strong palpable radial and ulnar pulse in the right arm.  There is no soft tissue swelling, ecchymosis, tenderness, redness or warmth.  No bleeding or drainage noted.  Compartments throughout the right arm are soft.  He is a strong right grip strength and normal sensation throughout the right arm.  No joint effusions noted.    SKIN: Normal color for age and race; warm; no rash NEURO: Moves all extremities equally PSYCH: The patient's mood and manner are appropriate. Grooming and personal hygiene are  appropriate.  MEDICAL DECISION MAKING: Patient here with loss of thrill from his right upper extremity fistula.  It appears his first surgery for this fistula was in April 2020 and second was in May 2020 by Dr. Trula Slade.  He has no signs of cellulitis, compartment syndrome, DVT, arterial obstruction on exam.  Will discuss with vascular surgery on-call.  Will keep patient n.p.o.  ED PROGRESS: 5:40 AM  Discussed with Dr. Doren Custard on call for vascular surgery.  Appreciate his help.  He will see patient in the emergency department.  Would like for me to hold heparin at this time.  Patient may be able to have a procedure this morning.  6:38 AM  Dr. Scot Dock will get patient on schedule today for catherization of fistula.  No heparin at this time.  Will stay in ED  until his procedure.  I reviewed all nursing notes, vitals, pertinent previous records, EKGs, lab and urine results, imaging (as available).  Maurice Bell was evaluated in Emergency Department on 08/02/2019 for the symptoms described in the history of present illness. He was evaluated in the context of the global COVID-19 pandemic, which necessitated consideration that the patient might be at risk for infection with the SARS-CoV-2 virus that causes COVID-19. Institutional protocols and algorithms that pertain to the evaluation of patients at risk for COVID-19 are in a state of rapid change based on information released by regulatory bodies including the CDC and federal and state organizations. These policies and algorithms were followed during the patient's care in the ED.    Maurice Bell, Maurice Bison, DO 08/02/19 [phone]

## 2019-09-15 ENCOUNTER — Encounter: Payer: Self-pay | Admitting: Orthopaedic Surgery

## 2019-09-15 ENCOUNTER — Ambulatory Visit (INDEPENDENT_AMBULATORY_CARE_PROVIDER_SITE_OTHER): Payer: Medicare HMO

## 2019-09-15 ENCOUNTER — Ambulatory Visit (INDEPENDENT_AMBULATORY_CARE_PROVIDER_SITE_OTHER): Payer: Medicare HMO | Admitting: Orthopaedic Surgery

## 2019-09-15 ENCOUNTER — Other Ambulatory Visit: Payer: Self-pay

## 2019-09-15 VITALS — BP 120/71 | HR 80 | Ht 65.0 in | Wt 130.0 lb

## 2019-09-15 DIAGNOSIS — G8929 Other chronic pain: Secondary | ICD-10-CM | POA: Diagnosis not present

## 2019-09-15 DIAGNOSIS — M48061 Spinal stenosis, lumbar region without neurogenic claudication: Secondary | ICD-10-CM | POA: Insufficient documentation

## 2019-09-15 DIAGNOSIS — M545 Low back pain: Secondary | ICD-10-CM | POA: Diagnosis not present

## 2019-09-15 DIAGNOSIS — Z981 Arthrodesis status: Secondary | ICD-10-CM | POA: Diagnosis not present
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
## 2019-09-15 NOTE — Addendum Note (Signed)
Addended by: Meyer Cory on: 09/15/2019 02:07 PM   Modules accepted: Orders

## 2019-10-11 ENCOUNTER — Ambulatory Visit (INDEPENDENT_AMBULATORY_CARE_PROVIDER_SITE_OTHER): Payer: Medicare HMO | Admitting: Physical Medicine and Rehabilitation

## 2019-10-11 ENCOUNTER — Ambulatory Visit: Payer: Self-pay

## 2019-10-11 ENCOUNTER — Other Ambulatory Visit: Payer: Self-pay

## 2019-10-11 ENCOUNTER — Encounter: Payer: Self-pay | Admitting: Physical Medicine and Rehabilitation

## 2019-10-11 VITALS — BP 180/97 | HR 116

## 2019-10-11 DIAGNOSIS — M5416 Radiculopathy, lumbar region: Secondary | ICD-10-CM | POA: Diagnosis not present

## 2019-10-11 MED ORDER — BETAMETHASONE SOD PHOS & ACET 6 (3-3) MG/ML IJ SUSP
12.0000 mg | Freq: Once | INTRAMUSCULAR | Status: AC
  Administered 2019-10-11: 12 mg

## 2019-10-11 NOTE — Progress Notes (Signed)
 .  Numeric Pain Rating Scale and Functional Assessment Average Pain 7   In the last MONTH (on 0-10 scale) has pain interfered with the following?  1. General activity like being  able to carry out your everyday physical activities such as walking, climbing stairs, carrying groceries, or moving a chair?  Rating(9)   +Driver, -BT, -Dye Allergies.

## 2019-10-11 NOTE — Procedures (Signed)
Lumbosacral Transforaminal Epidural Steroid Injection - Sub-Pedicular Approach with Fluoroscopic Guidance  Patient: Maurice Bell Reasons      Date of Birth: September 06, 1956 MRN: [id number] PCP: Patient, No Pcp Per      Visit Date: 10/11/2019   Universal Protocol:    Date/Time: 10/11/2019  Consent Given By: the patient  Position: PRONE  Additional Comments: Vital signs were monitored before and after the procedure. Patient was prepped and draped in the usual sterile fashion. The correct patient, procedure, and site was verified.   Injection Procedure Details:  Procedure Site One Meds Administered:  Meds ordered this encounter  Medications  . betamethasone acetate-betamethasone sodium phosphate (CELESTONE) injection 12 mg    Laterality: Bilateral  Location/Site: Prior MRI designates sacralized L5 segment, collapse at L4-5. L4-L5  Needle size: 22 G  Needle type: Spinal  Needle Placement: Transforaminal  Findings:    -Comments: Excellent flow of contrast along the nerve and into the epidural space.  Procedure Details: After squaring off the end-plates to get a true AP view, the C-arm was positioned so that an oblique view of the foramen as noted above was visualized. The target area is just inferior to the "nose of the scotty dog" or sub pedicular. The soft tissues overlying this structure were infiltrated with 2-3 ml. of 1% Lidocaine without Epinephrine.  The spinal needle was inserted toward the target using a "trajectory" view along the fluoroscope beam.  Under AP and lateral visualization, the needle was advanced so it did not puncture dura and was located close the 6 O'Clock position of the pedical in AP tracterory. Biplanar projections were used to confirm position. Aspiration was confirmed to be negative for CSF and/or blood. A 1-2 ml. volume of Isovue-250 was injected and flow of contrast was noted at each level. Radiographs were obtained for documentation purposes.   After  attaining the desired flow of contrast documented above, a 0.5 to 1.0 ml test dose of 0.25% Marcaine was injected into each respective transforaminal space.  The patient was observed for 90 seconds post injection.  After no sensory deficits were reported, and normal lower extremity motor function was noted,   the above injectate was administered so that equal amounts of the injectate were placed at each foramen (level) into the transforaminal epidural space.   Additional Comments:  The patient tolerated the procedure well Dressing: 2 x 2 sterile gauze and Band-Aid    Post-procedure details: Patient was observed during the procedure. Post-procedure instructions were reviewed.  Patient left the clinic in stable condition.
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
## 2019-10-20 ENCOUNTER — Other Ambulatory Visit: Payer: Self-pay

## 2019-10-20 ENCOUNTER — Encounter: Payer: Self-pay | Admitting: Orthopaedic Surgery

## 2019-10-20 ENCOUNTER — Ambulatory Visit (INDEPENDENT_AMBULATORY_CARE_PROVIDER_SITE_OTHER): Payer: Medicare HMO | Admitting: Orthopaedic Surgery

## 2019-10-20 VITALS — BP 113/56 | HR 68 | Ht 65.0 in | Wt 135.0 lb

## 2019-10-20 DIAGNOSIS — M48061 Spinal stenosis, lumbar region without neurogenic claudication: Secondary | ICD-10-CM

## 2019-10-20 DIAGNOSIS — N250 Renal osteodystrophy: Secondary | ICD-10-CM
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
## 2019-11-09 ENCOUNTER — Other Ambulatory Visit: Payer: Self-pay

## 2019-11-09 ENCOUNTER — Encounter (HOSPITAL_COMMUNITY)
Admission: EM | Disposition: A | Discharge status: Home / Self Care | Payer: Self-pay | Source: Home / Self Care | Attending: Emergency Medicine

## 2019-11-09 ENCOUNTER — Emergency Department (HOSPITAL_COMMUNITY): Payer: Medicare HMO

## 2019-11-09 ENCOUNTER — Observation Stay (HOSPITAL_COMMUNITY)
Admission: EM | Admit: 2019-11-09 | Discharge: 2019-11-10 | Disposition: A | Discharge status: Home / Self Care | Payer: Medicare HMO | Attending: Interventional Cardiology | Admitting: Interventional Cardiology

## 2019-11-09 ENCOUNTER — Inpatient Hospital Stay (HOSPITAL_COMMUNITY): Payer: Medicare HMO

## 2019-11-09 DIAGNOSIS — G259 Extrapyramidal and movement disorder, unspecified: Secondary | ICD-10-CM | POA: Diagnosis present

## 2019-11-09 DIAGNOSIS — R072 Precordial pain: Secondary | ICD-10-CM | POA: Diagnosis not present

## 2019-11-09 DIAGNOSIS — I309 Acute pericarditis, unspecified: Secondary | ICD-10-CM

## 2019-11-09 DIAGNOSIS — Z992 Dependence on renal dialysis: Secondary | ICD-10-CM | POA: Insufficient documentation

## 2019-11-09 DIAGNOSIS — Z8249 Family history of ischemic heart disease and other diseases of the circulatory system: Secondary | ICD-10-CM | POA: Diagnosis not present

## 2019-11-09 DIAGNOSIS — Z885 Allergy status to narcotic agent status: Secondary | ICD-10-CM | POA: Insufficient documentation

## 2019-11-09 DIAGNOSIS — I10 Essential (primary) hypertension: Secondary | ICD-10-CM | POA: Diagnosis not present

## 2019-11-09 DIAGNOSIS — I77819 Aortic ectasia, unspecified site: Secondary | ICD-10-CM | POA: Insufficient documentation

## 2019-11-09 DIAGNOSIS — N186 End stage renal disease: Secondary | ICD-10-CM | POA: Diagnosis present

## 2019-11-09 DIAGNOSIS — I351 Nonrheumatic aortic (valve) insufficiency: Secondary | ICD-10-CM | POA: Diagnosis not present

## 2019-11-09 DIAGNOSIS — I213 ST elevation (STEMI) myocardial infarction of unspecified site: Secondary | ICD-10-CM

## 2019-11-09 DIAGNOSIS — I1311 Hypertensive heart and chronic kidney disease without heart failure, with stage 5 chronic kidney disease, or end stage renal disease: Secondary | ICD-10-CM | POA: Insufficient documentation

## 2019-11-09 DIAGNOSIS — R9431 Abnormal electrocardiogram [ECG] [EKG]: Secondary | ICD-10-CM | POA: Diagnosis not present

## 2019-11-09 DIAGNOSIS — G253 Myoclonus: Secondary | ICD-10-CM | POA: Diagnosis present

## 2019-11-09 DIAGNOSIS — Z79899 Other long term (current) drug therapy: Secondary | ICD-10-CM | POA: Insufficient documentation

## 2019-11-09 DIAGNOSIS — K219 Gastro-esophageal reflux disease without esophagitis: Secondary | ICD-10-CM | POA: Insufficient documentation

## 2019-11-09 DIAGNOSIS — Z8673 Personal history of transient ischemic attack (TIA), and cerebral infarction without residual deficits: Secondary | ICD-10-CM | POA: Diagnosis not present

## 2019-11-09 DIAGNOSIS — Z20822 Contact with and (suspected) exposure to covid-19: Secondary | ICD-10-CM | POA: Insufficient documentation

## 2019-11-09 DIAGNOSIS — M1990 Unspecified osteoarthritis, unspecified site: Secondary | ICD-10-CM | POA: Insufficient documentation

## 2019-11-09 HISTORY — PX: LEFT HEART CATH AND CORONARY ANGIOGRAPHY: CATH118249

## 2019-11-09 LAB — CBC WITH DIFFERENTIAL/PLATELET
Abs Immature Granulocytes: 0.04 10*3/uL (ref 0.00–0.07)
Basophils Absolute: 0 10*3/uL (ref 0.0–0.1)
Basophils Relative: 0 %
Eosinophils Absolute: 0 10*3/uL (ref 0.0–0.5)
Eosinophils Relative: 0 %
HCT: 37.9 % — ABNORMAL LOW (ref 39.0–52.0)
Hemoglobin: 12.5 g/dL — ABNORMAL LOW (ref 13.0–17.0)
Immature Granulocytes: 1 %
Lymphocytes Relative: 7 %
Lymphs Abs: 0.6 10*3/uL — ABNORMAL LOW (ref 0.7–4.0)
MCH: 35.8 pg — ABNORMAL HIGH (ref 26.0–34.0)
MCHC: 33 g/dL (ref 30.0–36.0)
MCV: 108.6 fL — ABNORMAL HIGH (ref 80.0–100.0)
Monocytes Absolute: 0.8 10*3/uL (ref 0.1–1.0)
Monocytes Relative: 11 %
Neutro Abs: 6.1 10*3/uL (ref 1.7–7.7)
Neutrophils Relative %: 81 %
Platelets: 122 10*3/uL — ABNORMAL LOW (ref 150–400)
RBC: 3.49 MIL/uL — ABNORMAL LOW (ref 4.22–5.81)
RDW: 16.2 % — ABNORMAL HIGH (ref 11.5–15.5)
WBC: 7.6 10*3/uL (ref 4.0–10.5)
nRBC: 0 % (ref 0.0–0.2)

## 2019-11-09 LAB — APTT: aPTT: 43 seconds — ABNORMAL HIGH (ref 24–36)

## 2019-11-09 LAB — COMPREHENSIVE METABOLIC PANEL
ALT: 19 U/L (ref 0–44)
AST: 28 U/L (ref 15–41)
Albumin: 3.1 g/dL — ABNORMAL LOW (ref 3.5–5.0)
Alkaline Phosphatase: 41 U/L (ref 38–126)
Anion gap: 16 — ABNORMAL HIGH (ref 5–15)
BUN: 38 mg/dL — ABNORMAL HIGH (ref 8–23)
CO2: 28 mmol/L (ref 22–32)
Calcium: 9.2 mg/dL (ref 8.9–10.3)
Chloride: 91 mmol/L — ABNORMAL LOW (ref 98–111)
Creatinine, Ser: 8.07 mg/dL — ABNORMAL HIGH (ref 0.61–1.24)
GFR calc Af Amer: 8 mL/min — ABNORMAL LOW (ref >60)
GFR calc non Af Amer: 6 mL/min — ABNORMAL LOW (ref >60)
Glucose, Bld: 146 mg/dL — ABNORMAL HIGH (ref 70–99)
Potassium: 5 mmol/L (ref 3.5–5.1)
Sodium: 135 mmol/L (ref 135–145)
Total Bilirubin: 0.5 mg/dL (ref 0.3–1.2)
Total Protein: 6.9 g/dL (ref 6.5–8.1)

## 2019-11-09 LAB — TROPONIN I (HIGH SENSITIVITY)
Troponin I (High Sensitivity): 20 ng/L — ABNORMAL HIGH (ref <18)
Troponin I (High Sensitivity): 25 ng/L — ABNORMAL HIGH (ref <18)

## 2019-11-09 LAB — CBC
HCT: 38.9 % — ABNORMAL LOW (ref 39.0–52.0)
Hemoglobin: 12.5 g/dL — ABNORMAL LOW (ref 13.0–17.0)
MCH: 35.1 pg — ABNORMAL HIGH (ref 26.0–34.0)
MCHC: 32.1 g/dL (ref 30.0–36.0)
MCV: 109.3 fL — ABNORMAL HIGH (ref 80.0–100.0)
Platelets: 120 10*3/uL — ABNORMAL LOW (ref 150–400)
RBC: 3.56 MIL/uL — ABNORMAL LOW (ref 4.22–5.81)
RDW: 16 % — ABNORMAL HIGH (ref 11.5–15.5)
WBC: 8.8 10*3/uL (ref 4.0–10.5)
nRBC: 0 % (ref 0.0–0.2)

## 2019-11-09 LAB — LIPID PANEL
Cholesterol: 108 mg/dL (ref 0–200)
HDL: 37 mg/dL — ABNORMAL LOW (ref >40)
LDL Cholesterol: 47 mg/dL (ref 0–99)
Total CHOL/HDL Ratio: 2.9 RATIO
Triglycerides: 120 mg/dL (ref <150)
VLDL: 24 mg/dL (ref 0–40)

## 2019-11-09 LAB — PROTIME-INR
INR: 1.1 (ref 0.8–1.2)
Prothrombin Time: 14.5 seconds (ref 11.4–15.2)

## 2019-11-09 LAB — RESPIRATORY PANEL BY RT PCR (FLU A&B, COVID)
Influenza A by PCR: NEGATIVE
Influenza B by PCR: NEGATIVE
SARS Coronavirus 2 by RT PCR: NEGATIVE

## 2019-11-09 LAB — CREATININE, SERUM
Creatinine, Ser: 7.88 mg/dL — ABNORMAL HIGH (ref 0.61–1.24)
GFR calc Af Amer: 8 mL/min — ABNORMAL LOW (ref >60)
GFR calc non Af Amer: 7 mL/min — ABNORMAL LOW (ref >60)

## 2019-11-09 SURGERY — LEFT HEART CATH AND CORONARY ANGIOGRAPHY
Anesthesia: LOCAL

## 2019-11-09 MED ORDER — ASPIRIN 81 MG PO CHEW: 324.0000 mg | CHEWABLE_TABLET | Freq: Once | ORAL | Status: DC

## 2019-11-09 MED ORDER — SODIUM CHLORIDE 0.9% FLUSH: 3.0000 mL | INTRAVENOUS | Status: DC | PRN

## 2019-11-09 MED ORDER — LIDOCAINE HCL (PF) 1 % IJ SOLN
INTRAMUSCULAR | Status: DC | PRN
  Administered 2019-11-09: 15 mL

## 2019-11-09 MED ORDER — FENTANYL CITRATE (PF) 100 MCG/2ML IJ SOLN
INTRAMUSCULAR | Status: AC
  Filled 2019-11-09: qty 2

## 2019-11-09 MED ORDER — HEPARIN SODIUM (PORCINE) 5000 UNIT/ML IJ SOLN
5000.0000 [IU] | Freq: Three times a day (TID) | INTRAMUSCULAR | Status: DC
  Administered 2019-11-09 – 2019-11-10 (×2): 5000 [IU] via SUBCUTANEOUS
  Filled 2019-11-09 (×2): qty 1

## 2019-11-09 MED ORDER — LIDOCAINE HCL (PF) 1 % IJ SOLN
INTRAMUSCULAR | Status: AC
  Filled 2019-11-09: qty 30

## 2019-11-09 MED ORDER — SEVELAMER CARBONATE 800 MG PO TABS
3200.0000 mg | ORAL_TABLET | Freq: Three times a day (TID) | ORAL | Status: DC
  Administered 2019-11-10: 3200 mg via ORAL
  Filled 2019-11-09: qty 4

## 2019-11-09 MED ORDER — NITROGLYCERIN 1 MG/10 ML FOR IR/CATH LAB
INTRA_ARTERIAL | Status: AC
  Filled 2019-11-09: qty 10

## 2019-11-09 MED ORDER — CLONIDINE HCL 0.1 MG PO TABS
0.1000 mg | ORAL_TABLET | Freq: Three times a day (TID) | ORAL | Status: DC
  Filled 2019-11-09: qty 1

## 2019-11-09 MED ORDER — HEPARIN SODIUM (PORCINE) 5000 UNIT/ML IJ SOLN: 60.0000 [IU]/kg | Freq: Once | INTRAMUSCULAR | Status: DC

## 2019-11-09 MED ORDER — MORPHINE SULFATE (PF) 2 MG/ML IV SOLN
2.0000 mg | INTRAVENOUS | Status: DC | PRN
  Administered 2019-11-09: 2 mg via INTRAVENOUS
  Filled 2019-11-09: qty 1

## 2019-11-09 MED ORDER — HYDRALAZINE HCL 20 MG/ML IJ SOLN: 10.0000 mg | INTRAMUSCULAR | Status: AC | PRN

## 2019-11-09 MED ORDER — METOPROLOL TARTRATE 50 MG PO TABS
150.0000 mg | ORAL_TABLET | Freq: Two times a day (BID) | ORAL | Status: DC
  Filled 2019-11-09: qty 1

## 2019-11-09 MED ORDER — HEPARIN (PORCINE) IN NACL 1000-0.9 UT/500ML-% IV SOLN
INTRAVENOUS | Status: AC
  Filled 2019-11-09: qty 500

## 2019-11-09 MED ORDER — SODIUM CHLORIDE 0.9 % IV SOLN: INTRAVENOUS | Status: DC

## 2019-11-09 MED ORDER — SODIUM CHLORIDE 0.9 % IV SOLN: 250.0000 mL | INTRAVENOUS | Status: DC | PRN

## 2019-11-09 MED ORDER — IOHEXOL 350 MG/ML SOLN
INTRAVENOUS | Status: DC | PRN
  Administered 2019-11-09: 19:00:00 115 mL

## 2019-11-09 MED ORDER — HYDRALAZINE HCL 50 MG PO TABS
50.0000 mg | ORAL_TABLET | Freq: Two times a day (BID) | ORAL | Status: DC
  Filled 2019-11-09: qty 1

## 2019-11-09 MED ORDER — AMLODIPINE BESYLATE 10 MG PO TABS
10.0000 mg | ORAL_TABLET | Freq: Every day | ORAL | Status: DC
  Filled 2019-11-09: qty 1

## 2019-11-09 MED ORDER — LABETALOL HCL 5 MG/ML IV SOLN: 10.0000 mg | INTRAVENOUS | Status: AC | PRN

## 2019-11-09 MED ORDER — FENTANYL CITRATE (PF) 100 MCG/2ML IJ SOLN
INTRAMUSCULAR | Status: DC | PRN
  Administered 2019-11-09 (×2): 25 ug via INTRAVENOUS

## 2019-11-09 MED ORDER — SEVELAMER CARBONATE 800 MG PO TABS: 1600.0000 mg | ORAL_TABLET | ORAL | Status: DC | PRN

## 2019-11-09 MED ORDER — MIDAZOLAM HCL 2 MG/2ML IJ SOLN
INTRAMUSCULAR | Status: DC | PRN
  Administered 2019-11-09: 1 mg via INTRAVENOUS

## 2019-11-09 MED ORDER — HEPARIN (PORCINE) IN NACL 1000-0.9 UT/500ML-% IV SOLN
INTRAVENOUS | Status: DC | PRN
  Administered 2019-11-09: 500 mL

## 2019-11-09 MED ORDER — VERAPAMIL HCL 2.5 MG/ML IV SOLN
INTRAVENOUS | Status: AC
  Filled 2019-11-09: qty 2

## 2019-11-09 MED ORDER — MIDAZOLAM HCL 2 MG/2ML IJ SOLN
INTRAMUSCULAR | Status: AC
  Filled 2019-11-09: qty 2

## 2019-11-09 MED ORDER — SODIUM CHLORIDE 0.9% FLUSH
3.0000 mL | Freq: Two times a day (BID) | INTRAVENOUS | Status: DC
  Administered 2019-11-09: 3 mL via INTRAVENOUS

## 2019-11-09 MED ORDER — SODIUM CHLORIDE 0.9 % IV SOLN
INTRAVENOUS | Status: AC | PRN
  Administered 2019-11-09: 10 mL/h via INTRAVENOUS

## 2019-11-09 MED ORDER — ONDANSETRON HCL 4 MG/2ML IJ SOLN: 4.0000 mg | Freq: Four times a day (QID) | INTRAMUSCULAR | Status: DC | PRN

## 2019-11-09 MED ORDER — ACETAMINOPHEN 325 MG PO TABS: 650.0000 mg | ORAL_TABLET | ORAL | Status: DC | PRN

## 2019-11-09 MED ORDER — OXYCODONE-ACETAMINOPHEN 5-325 MG PO TABS
1.0000 | ORAL_TABLET | Freq: Three times a day (TID) | ORAL | Status: DC | PRN
  Administered 2019-11-10: 1 via ORAL
  Filled 2019-11-09: qty 1

## 2019-11-09 SURGICAL SUPPLY — 16 items
CATH DXT MULTI JL4 JR4 ANG PIG (CATHETERS) ×1 IMPLANT
CATH INFINITI 5 FR AR1 MOD (CATHETERS) ×1 IMPLANT
CATH INFINITI 5 FR AR2 MOD (CATHETERS) ×1 IMPLANT
CATH INFINITI 5 FR MPA2 (CATHETERS) ×1 IMPLANT
CATH INFINITI 5FR AL1 (CATHETERS) ×1 IMPLANT
GLIDESHEATH SLEND SS 6F .021 (SHEATH) IMPLANT
GUIDEWIRE INQWIRE 1.5J.035X260 (WIRE) IMPLANT
INQWIRE 1.5J .035X260CM (WIRE)
KIT ENCORE 26 ADVANTAGE (KITS) IMPLANT
KIT HEART LEFT (KITS) ×2 IMPLANT
PACK CARDIAC CATHETERIZATION (CUSTOM PROCEDURE TRAY) ×2 IMPLANT
SHEATH PINNACLE 6F 10CM (SHEATH) ×1 IMPLANT
SHEATH PROBE COVER 6X72 (BAG) ×1 IMPLANT
TRANSDUCER W/STOPCOCK (MISCELLANEOUS) ×2 IMPLANT
TUBING CIL FLEX 10 FLL-RA (TUBING) ×1 IMPLANT
WIRE EMERALD 3MM-J .035X150CM (WIRE) ×1 IMPLANT

## 2019-11-09 NOTE — ED Notes (Signed)
Activated code stemi per MD Bero, with Cassie from [address].

## 2019-11-09 NOTE — ED Triage Notes (Signed)
Pt transproted from MD office with c/o CP x 3 days which is worse with exertion.  No fever, cough or chills. Reports pain increases with cough, movement or taking deep breath.  Currently at rest pt denies any pain.  Pt alert and oriented in NAD.
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
## 2019-11-10 ENCOUNTER — Inpatient Hospital Stay (HOSPITAL_BASED_OUTPATIENT_CLINIC_OR_DEPARTMENT_OTHER): Payer: Medicare HMO

## 2019-11-10 DIAGNOSIS — I351 Nonrheumatic aortic (valve) insufficiency: Secondary | ICD-10-CM | POA: Diagnosis not present

## 2019-11-10 DIAGNOSIS — I300 Acute nonspecific idiopathic pericarditis: Secondary | ICD-10-CM

## 2019-11-10 DIAGNOSIS — R9431 Abnormal electrocardiogram [ECG] [EKG]: Secondary | ICD-10-CM | POA: Diagnosis not present

## 2019-11-10 DIAGNOSIS — R072 Precordial pain: Secondary | ICD-10-CM | POA: Diagnosis not present

## 2019-11-10 DIAGNOSIS — I309 Acute pericarditis, unspecified: Secondary | ICD-10-CM

## 2019-11-10 DIAGNOSIS — I213 ST elevation (STEMI) myocardial infarction of unspecified site: Secondary | ICD-10-CM

## 2019-11-10 DIAGNOSIS — Z20822 Contact with and (suspected) exposure to covid-19: Secondary | ICD-10-CM | POA: Diagnosis not present

## 2019-11-10 LAB — CBC
HCT: 36.1 % — ABNORMAL LOW (ref 39.0–52.0)
Hemoglobin: 12 g/dL — ABNORMAL LOW (ref 13.0–17.0)
MCH: 35.9 pg — ABNORMAL HIGH (ref 26.0–34.0)
MCHC: 33.2 g/dL (ref 30.0–36.0)
MCV: 108.1 fL — ABNORMAL HIGH (ref 80.0–100.0)
Platelets: 123 10*3/uL — ABNORMAL LOW (ref 150–400)
RBC: 3.34 MIL/uL — ABNORMAL LOW (ref 4.22–5.81)
RDW: 16 % — ABNORMAL HIGH (ref 11.5–15.5)
WBC: 5.1 10*3/uL (ref 4.0–10.5)
nRBC: 0 % (ref 0.0–0.2)

## 2019-11-10 LAB — BASIC METABOLIC PANEL
Anion gap: 14 (ref 5–15)
BUN: 46 mg/dL — ABNORMAL HIGH (ref 8–23)
CO2: 30 mmol/L (ref 22–32)
Calcium: 8.9 mg/dL (ref 8.9–10.3)
Chloride: 89 mmol/L — ABNORMAL LOW (ref 98–111)
Creatinine, Ser: 8.66 mg/dL — ABNORMAL HIGH (ref 0.61–1.24)
GFR calc Af Amer: 7 mL/min — ABNORMAL LOW (ref >60)
GFR calc non Af Amer: 6 mL/min — ABNORMAL LOW (ref >60)
Glucose, Bld: 106 mg/dL — ABNORMAL HIGH (ref 70–99)
Potassium: 5.1 mmol/L (ref 3.5–5.1)
Sodium: 133 mmol/L — ABNORMAL LOW (ref 135–145)

## 2019-11-10 LAB — ECHOCARDIOGRAM COMPLETE
Height: 65 in
Weight: 1936.52 oz

## 2019-11-10 LAB — POCT ACTIVATED CLOTTING TIME: Activated Clotting Time: 175 seconds

## 2019-11-10 MED ORDER — ASPIRIN EC 81 MG PO TBEC
81.0000 mg | DELAYED_RELEASE_TABLET | Freq: Two times a day (BID) | ORAL | Status: DC
  Administered 2019-11-10: 81 mg via ORAL
  Filled 2019-11-10: qty 1

## 2019-11-10 MED ORDER — ESOMEPRAZOLE MAGNESIUM 20 MG PO CPDR: 20.0000 mg | DELAYED_RELEASE_CAPSULE | Freq: Every day | ORAL | 0 refills | Status: DC

## 2019-11-10 MED ORDER — ASPIRIN 81 MG PO TBEC: 81.0000 mg | DELAYED_RELEASE_TABLET | Freq: Two times a day (BID) | ORAL | 0 refills | Status: DC

## 2019-11-10 MED ORDER — PANTOPRAZOLE SODIUM 40 MG PO TBEC
40.0000 mg | DELAYED_RELEASE_TABLET | Freq: Every day | ORAL | Status: DC
  Administered 2019-11-10: 40 mg via ORAL
  Filled 2019-11-10: qty 1

## 2019-11-10 MED FILL — Verapamil HCl IV Soln 2.5 MG/ML: INTRAVENOUS | Qty: 2 | Status: AC

## 2019-11-10 MED FILL — Nitroglycerin IV Soln 100 MCG/ML in D5W: INTRA_ARTERIAL | Qty: 10 | Status: AC

## 2019-11-10 NOTE — Progress Notes (Signed)
Renal Navigator received call from CM/B. Graves-Bigelow stating patient is cleared for discharge and wants to know if he will need HD today.  Navigator spoke with Renal PA/K. Stovall who states patient is cleared for discharge from a Renal standpoint to have his next HD tx at [facility] tomorrow at his regularly scheduled seat time as long as he is feeling fine today. He is a TTS patient who had his last tx on Wednesday, 11/08/19 (full tx per OP HD records). Renal Navigator spoke with bedside RN who was with patient. Per RN, patient feels good today and in agreement with plan for discharge today with next HD at [facility] tomorrow, Saturday, 11/11/19 on normal schedule.

## 2019-11-10 NOTE — Progress Notes (Addendum)
Progress Note  [name] Date of Encounter: 11/10/2019  Primary Cardiologist: New to Dr. Irish Lack  Subjective   He reports chest pressure with deep breath.  Involuntary movements.  However missed dialysis session  Inpatient Medications    Scheduled Meds: . amLODipine  10 mg Oral Daily  . cloNIDine  0.1 mg Oral TID  . heparin  60 Units/kg Intravenous Once  . heparin  5,000 Units Subcutaneous Q8H  . hydrALAZINE  50 mg Oral BID  . metoprolol tartrate  150 mg Oral BID  . sevelamer carbonate  3,200 mg Oral TID WC  . sodium chloride flush  3 mL Intravenous Q12H   Continuous Infusions: . sodium chloride    . sodium chloride     PRN Meds: sodium chloride, acetaminophen, morphine injection, ondansetron (ZOFRAN) IV, oxyCODONE-acetaminophen, sevelamer carbonate, sodium chloride flush   Vital Signs    Vitals:   11/10/19 0034 11/10/19 0333 11/10/19 0421 11/10/19 0743  BP: 115/66 114/66  124/68  Pulse: 69 69 94 86  Resp: 17 16 16 18   Temp:  98.3 F (36.8 C)  98.1 F (36.7 C)  TempSrc:  Oral  Oral  SpO2: 100% 100%    Weight:  54.9 kg    Height:        Intake/Output Summary (Last 24 hours) at 11/10/2019 0747 Last data filed at 11/09/2019 2030 Gross per 24 hour  Intake 340 ml  Output --  Net 340 ml   Last 3 Weights 11/10/2019 11/09/2019 11/09/2019  Weight (lbs) 121 lb 0.5 oz 121 lb 0.5 oz 120 lb  Weight (kg) 54.9 kg 54.9 kg 54.432 kg  Some encounter information is confidential and restricted. Go to Review Flowsheets activity to see all data.      Telemetry    Sinus rhythm with PVC- Personally Reviewed  ECG  Sinus rhythm, ST elevation inferiorly and depression in lateral leads- Personally Reviewed  Physical Exam   GEN: No acute distress.   Neck: No JVD Cardiac: RRR, 3/6 systolic murmurs, rubs, or gallops.  Respiratory: Clear to auscultation bilaterally. GI: Soft, nontender, non-distended  MS: No edema; No deformity.  Right arm fistula Neuro:    Bilateral leg weakness with involuntary movement Psych: Normal affect   Labs    High Sensitivity Troponin:   Recent Labs  Lab 11/09/19 1754 11/09/19 2001  TROPONINIHS 20* 25*      Chemistry Recent Labs  Lab 11/09/19 1754 11/09/19 2001 11/10/19 0357  NA 135  --  133*  K 5.0  --  5.1  CL 91*  --  89*  CO2 28  --  30  GLUCOSE 146*  --  106*  BUN 38*  --  46*  CREATININE 8.07* 7.88* 8.66*  CALCIUM 9.2  --  8.9  PROT 6.9  --   --   ALBUMIN 3.1*  --   --   AST 28  --   --   ALT 19  --   --   ALKPHOS 41  --   --   BILITOT 0.5  --   --   GFRNONAA 6* 7* 6*  GFRAA 8* 8* 7*  ANIONGAP 16*  --  14     Hematology Recent Labs  Lab 11/09/19 1754 11/09/19 2001 11/10/19 0357  WBC 7.6 8.8 5.1  RBC 3.49* 3.56* 3.34*  HGB 12.5* 12.5* 12.0*  HCT 37.9* 38.9* 36.1*  MCV 108.6* 109.3* 108.1*  MCH 35.8* 35.1* 35.9*  MCHC 33.0 32.1 33.2  RDW  16.2* 16.0* 16.0*  PLT 122* 120* 123*    Radiology    CARDIAC CATHETERIZATION  Result Date: 11/09/2019  The left ventricular systolic function is normal.  LV end diastolic pressure is normal.  The left ventricular ejection fraction is 55-65% by visual estimate.  There is no aortic valve stenosis.  No angiographically apparent CAD.  Difficult to engage RCA. AR2 pointing towards left cusp was able to nonselectively fill RCA.  Noncardiac chest pain.   Continue preventive therapy.  Likely discharge in AM.   DG Chest Port 1 View  Result Date: 11/09/2019 CLINICAL DATA:  Chest pain following cardiac catheterization EXAM: PORTABLE CHEST 1 VIEW COMPARISON:  07/25/2019 FINDINGS: Cardiac shadow is enlarged but stable. Lungs are well aerated bilaterally. Previously seen vascular congestion has improved in the interval. Some mild interstitial edema is seen. No sizable effusion is noted. Postsurgical changes in the cervical spine are noted. IMPRESSION: Mild vascular congestion with interstitial edema but significantly improved from the prior exam.  Electronically Signed   By: Inez Catalina M.D.   On: 11/09/2019 20:17    Cardiac Studies   LEFT HEART CATH AND CORONARY ANGIOGRAPHY  Conclusion    The left ventricular systolic function is normal.  LV end diastolic pressure is normal.  The left ventricular ejection fraction is 55-65% by visual estimate.  There is no aortic valve stenosis.  No angiographically apparent CAD.  Difficult to engage RCA. AR2 pointing towards left cusp was able to nonselectively fill RCA.   Noncardiac chest pain.   Continue preventive therapy.  Likely discharge in AM.     Patient Profile     64 y.o. male with history of prior CVA, end-stage renal disease on hemodialysis Monday Wednesday Friday, hypertension and significant orthopedic issue presents for evaluation of chest pain.  Assessment & Plan    1. Inferior STEMI -Intermittent chest pain across 3 days.  EKG concerning for inferior ST elevation with lateral T wave inversion.  Code STEMI was called and he was taken to cardiac lab.  Cath showed no angiographically apparent CAD.  His pain felt noncardiac.  High-sensitivity troponin 20>25. Normal EF by visual estimate.  2.  Systolic murmur -get echo  3.  End-stage renal disease -He is due for dialysis today  4.  Prior cervical fusion/significant back issue  -he has been dealing with involuntary jerking extremity movements recently.  He has prior history of this but eventually resolved but now reoccurring.  He is followed by Dr. Lorin Mercy.  Recent steroid injection helped but now reoccurred.  He is asking for further evaluation.   Will review plan with Dr. Burt Knack. He will need dialysis.  For questions or updates, please contact [facility] Please consult [email or web address] for contact info under        SignedLeanor Kail, PA  11/10/2019, 7:47 AM    Patient seen, examined. Available data reviewed. Agree with findings, assessment, and plan as outlined by Robbie Lis, PA.  On exam, the  patient is alert, oriented, in no distress.  Lungs are clear, heart is regular rate and rhythm with a loud friction rub at the left upper sternal border, no diastolic murmur,, abdomen is soft and nontender, extremities have no edema.  I have personally reviewed the patient's EKGs and echo study from this morning.  The formal interpretation of his echo is currently pending, but the study demonstrates normal LV systolic function with concentric LVH, a small to moderate pericardial effusion without any evidence of cardiac tamponade, and no  significant valvular disease.  Considering the patient's pleuritic chest pain, ST elevation on EKG with normal coronary arteries a cardiac catheterization, and pleuritic chest pain, I think there is a high likelihood that he has acute pericarditis.  Limited treatment options in this patient with end-stage renal disease on dialysis.  In reviewing the literature on acute pericarditis in hemodialysis patients, I think a short course of aspirin is probably the safest and most efficacious treatment.  Will prescribe aspirin 81 mg BID x 2 weeks as tolerated.  We will cover him with a proton pump inhibitor while he is taking this.  Spoke with nephrology who is concerned about high dose ASA or NSAID secondary to risk of GI bleed. In reviewing most recent ESC guidelines, colchicine is contraindicated in ESRD patients with pericarditis so will avoid that. The nephrology social worker has seen the patient and has arranged for outpatient dialysis tomorrow.  I think the patient is medically stable for hospital discharge.   Sherren Mocha, M.D. 11/10/2019 10:15 AM

## 2019-11-10 NOTE — Care Management Obs Status (Signed)
[place] NOTIFICATION   Patient Details  Name: Emron Reckner MRN: [id number] Date of Birth: 01/11/1956   Medicare Observation Status Notification Given:  Yes    Bethena Roys, RN 11/10/2019, 12:34 PM

## 2019-11-10 NOTE — Progress Notes (Signed)
Patients blood pressure is 110/66. He refused all his BP meds because for him this bp was too low to take medications. He stated when its low like this at home, he waits and rechecks his bp in the evening or he just won't take it for that day.

## 2019-11-10 NOTE — Progress Notes (Signed)
I was called by tele that patient had a short run of vfib. I immediately went to patient, he was alert and oriented in his usual self. He did say he felt some of these "twitches" that he is having. His rhythm was NSR when I got there. I sent Vin, PA a text message to review strips when he is able. Will continue to monitor. Patient had Zoll pads on him this morning from when he came in. I will leave them on at this point until instructed otherwise

## 2019-11-10 NOTE — Discharge Instructions (Signed)
Femoral Site Care This sheet gives you information about how to care for yourself after your procedure. Your health care provider may also give you more specific instructions. If you have problems or questions, contact your health care provider. What can I expect after the procedure? After the procedure, it is common to have:  Bruising that usually fades within 1-2 weeks.  Tenderness at the site. Follow these instructions at home: Wound care  Follow instructions from your health care provider about how to take care of your insertion site. Make sure you: ? Wash your hands with soap and water before you change your bandage (dressing). If soap and water are not available, use hand sanitizer. ? Change your dressing as told by your health care provider. ? Leave stitches (sutures), skin glue, or adhesive strips in place. These skin closures may need to stay in place for 2 weeks or longer. If adhesive strip edges start to loosen and curl up, you may trim the loose edges. Do not remove adhesive strips completely unless your health care provider tells you to do that.  Do not take baths, swim, or use a hot tub until your health care provider approves.  You may shower 24-48 hours after the procedure or as told by your health care provider. ? Gently wash the site with plain soap and water. ? Pat the area dry with a clean towel. ? Do not rub the site. This may cause bleeding.  Do not apply powder or lotion to the site. Keep the site clean and dry.  Check your femoral site every day for signs of infection. Check for: ? Redness, swelling, or pain. ? Fluid or blood. ? Warmth. ? Pus or a bad smell. Activity  For the first 2-3 days after your procedure, or as long as directed: ? Avoid climbing stairs as much as possible. ? Do not squat.  Do not lift anything that is heavier than 10 lb (4.5 kg), or the limit that you are told, until your health care provider says that it is safe.  Rest as  directed. ? Avoid sitting for a long time without moving. Get up to take short walks every 1-2 hours.  Do not drive for 24 hours if you were given a medicine to help you relax (sedative). General instructions  Take over-the-counter and prescription medicines only as told by your health care provider.  Keep all follow-up visits as told by your health care provider. This is important. Contact a health care provider if you have:  A fever or chills.  You have redness, swelling, or pain around your insertion site. Get help right away if:  The catheter insertion area swells very fast.  You pass out.  You suddenly start to sweat or your skin gets clammy.  The catheter insertion area is bleeding, and the bleeding does not stop when you hold steady pressure on the area.  The area near or just beyond the catheter insertion site becomes pale, cool, tingly, or numb. These symptoms may represent a serious problem that is an emergency. Do not wait to see if the symptoms will go away. Get medical help right away. Call your local emergency services (911 in the [place] Do not drive yourself to the hospital. Summary  After the procedure, it is common to have bruising that usually fades within 1-2 weeks.  Check your femoral site every day for signs of infection.  Do not lift anything that is heavier than 10 lb (4.5 kg), or the   limit that you are told, until your health care provider says that it is safe. This information is not intended to replace advice given to you by your health care provider. Make sure you discuss any questions you have with your health care provider. Document Revised: 11/01/2017 Document Reviewed: 11/01/2017 Elsevier Patient Education  2020 [employer]  

## 2019-11-10 NOTE — Progress Notes (Signed)
  Echocardiogram 2D Echocardiogram has been performed.  Maurice Bell 11/10/2019, 9:42 AM

## 2019-11-10 NOTE — Care Management CC44 (Signed)
Condition Code 44 Documentation Completed  Patient Details  Name: Maurice Bell MRN: [id number] Date of Birth: December 08, 1955   Condition Code 44 given:  Yes Patient signature on Condition Code 44 notice:  Yes Documentation of 2 MD's agreement:  Yes Code 44 added to claim:  Yes    Bethena Roys, RN 11/10/2019, 12:34 PM

## 2019-11-10 NOTE — Discharge Summary (Signed)
Discharge Summary    Patient ID: Maurice Bell MRN: [id number]; DOB: 28-Oct-1956  Admit date: 11/09/2019 Discharge date: 11/10/2019  Primary Care Provider: Patient, No Pcp Per  Primary Cardiologist: New to Dr. Irish Lack  Discharge Diagnoses    Principal Problem:   Acute pericarditis Active Problems:   ESRD (end stage renal disease) ([place])   HTN (hypertension)   Movement disorder   Myoclonic jerking   Precordial chest pain   STEMI (ST elevation myocardial infarction) ([place])     Diagnostic Studies/Procedures    LEFT HEART CATH AND CORONARY ANGIOGRAPHY  Conclusion    The left ventricular systolic function is normal.  LV end diastolic pressure is normal.  The left ventricular ejection fraction is 55-65% by visual estimate.  There is no aortic valve stenosis.  No angiographically apparent CAD.  Difficult to engage RCA. AR2 pointing towards left cusp was able to nonselectively fill RCA.  Noncardiac chest pain. Continue preventive therapy. Likely discharge in AM.   Echo done pending final reading Preliminary review by Dr. Burt Knack - Normal LVEF, small to moderate pericardial effusion   History of Present Illness     64 y.o. male with history of prior CVA, end-stage renal disease on hemodialysis, hypertension and significant orthopedic issue presents for evaluation of chest pain for past few days. Waxing and waning pain. EKG showed inferior ST elevation and taken to cath lab.   Hospital Course     Consultants: None  Emergent cath showed no evidence of CAD. Given atypical pleuritic chest pain and diffuse ST elevation on EKG his atypical pain felt likely due to acute pericarditis. He had rub on exam. Pending reading of echo but reviewed by Dr. Burt Knack who felt normal LVEF, small to moderate pericardial effusion without any evidence of cardiac tamponade, and no significant valvular disease. Troponin 20-->25.  Limited treatment options due to ESRD. Nephrology also felt  high bleeding risk on high dose ASA and NSAIDS. Colchicine is also contraindicated in dialysis patient. Plan for aspirin 81 mg BID x [redacted] weeks along with PPI. Felt stable for discharge. Per nephrology social worker, plan outpatient HD tomorrow (normal schedule TTS).   Did the patient have an acute coronary syndrome (MI, NSTEMI, STEMI, etc) this admission?:  No.   The elevated Troponin was due to the acute medical illness (demand ischemia).  _____________  Discharge Vitals Blood pressure 110/66, pulse 80, temperature 98.1 F (36.7 C), temperature source Oral, resp. rate 15, height 5\' 5"  (1.651 m), weight 54.9 kg, SpO2 100 %.  Filed Weights   11/09/19 1754 11/09/19 2028 11/10/19 0333  Weight: 54.4 kg 54.9 kg 54.9 kg    Labs & Radiologic Studies    CBC Recent Labs    11/09/19 1754 11/09/19 2001 11/10/19 0357  WBC 7.6 8.8 5.1  NEUTROABS 6.1  --   --   HGB 12.5* 12.5* 12.0*  HCT 37.9* 38.9* 36.1*  MCV 108.6* 109.3* 108.1*  PLT 122* 120* [id number]*   Basic Metabolic Panel Recent Labs    11/09/19 1754 11/09/19 2001 11/10/19 0357  NA 135  --  133*  K 5.0  --  5.1  CL 91*  --  89*  CO2 28  --  30  GLUCOSE 146*  --  106*  BUN 38*  --  46*  CREATININE 8.07* 7.88* 8.66*  CALCIUM 9.2  --  8.9   Liver Function Tests Recent Labs    11/09/19 1754  AST 28  ALT 19  ALKPHOS 41  BILITOT 0.5  PROT 6.9  ALBUMIN 3.1*   High Sensitivity Troponin:   Recent Labs  Lab 11/09/19 1754 11/09/19 2001  TROPONINIHS 20* 25*    Fasting Lipid Panel Recent Labs    11/09/19 2001  CHOL 108  HDL 37*  LDLCALC 47  TRIG 120  CHOLHDL 2.9   _____________  Epidural Steroid injection  Result Date: 10/11/2019 Magnus Sinning, MD     10/12/2019  6:10 AM Lumbosacral Transforaminal Epidural Steroid Injection - Sub-Pedicular Approach with Fluoroscopic Guidance Patient: Maurice Bell     Date of Birth: [birth year] MRN: [id number] PCP: Patient, No Pcp Per     Visit Date: 10/11/2019  Universal Protocol:    Date/Time: 10/11/2019 Consent Given By: the patient Position: PRONE Additional Comments: Vital signs were monitored before and after the procedure. Patient was prepped and draped in the usual sterile fashion. The correct patient, procedure, and site was verified. Injection Procedure Details: Procedure Site One Meds Administered: Meds ordered this encounter Medications . betamethasone acetate-betamethasone sodium phosphate (CELESTONE) injection 12 mg Laterality: Bilateral Location/Site: Prior MRI designates sacralized L5 segment, collapse at L4-5. L4-L5 Needle size: 22 G Needle type: Spinal Needle Placement: Transforaminal Findings:   -Comments: Excellent flow of contrast along the nerve and into the epidural space. Procedure Details: After squaring off the end-plates to get a true AP view, the C-arm was positioned so that an oblique view of the foramen as noted above was visualized. The target area is just inferior to the "nose of the scotty dog" or sub pedicular. The soft tissues overlying this structure were infiltrated with 2-3 ml. of 1% Lidocaine without Epinephrine. The spinal needle was inserted toward the target using a "trajectory" view along the fluoroscope beam.  Under AP and lateral visualization, the needle was advanced so it did not puncture dura and was located close the 6 O'Clock position of the pedical in AP tracterory. Biplanar projections were used to confirm position. Aspiration was confirmed to be negative for CSF and/or blood. A 1-2 ml. volume of Isovue-250 was injected and flow of contrast was noted at each level. Radiographs were obtained for documentation purposes. After attaining the desired flow of contrast documented above, a 0.5 to 1.0 ml test dose of 0.25% Marcaine was injected into each respective transforaminal space.  The patient was observed for 90 seconds post injection.  After no sensory deficits were reported, and normal lower extremity motor function was noted,   the above  injectate was administered so that equal amounts of the injectate were placed at each foramen (level) into the transforaminal epidural space. Additional Comments: The patient tolerated the procedure well Dressing: 2 x 2 sterile gauze and Band-Aid  Post-procedure details: Patient was observed during the procedure. Post-procedure instructions were reviewed. Patient left the clinic in stable condition.   CARDIAC CATHETERIZATION  Result Date: 11/09/2019  The left ventricular systolic function is normal.  LV end diastolic pressure is normal.  The left ventricular ejection fraction is 55-65% by visual estimate.  There is no aortic valve stenosis.  No angiographically apparent CAD.  Difficult to engage RCA. AR2 pointing towards left cusp was able to nonselectively fill RCA.  Noncardiac chest pain.   Continue preventive therapy.  Likely discharge in AM.   DG Chest Port 1 View  Result Date: 11/09/2019 CLINICAL DATA:  Chest pain following cardiac catheterization EXAM: PORTABLE CHEST 1 VIEW COMPARISON:  07/25/2019 FINDINGS: Cardiac shadow is enlarged but stable. Lungs are well aerated bilaterally. Previously seen vascular congestion has improved in the interval.  Some mild interstitial edema is seen. No sizable effusion is noted. Postsurgical changes in the cervical spine are noted. IMPRESSION: Mild vascular congestion with interstitial edema but significantly improved from the prior exam. Electronically Signed   By: Inez Catalina M.D.   On: 11/09/2019 20:17   ECHOCARDIOGRAM COMPLETE  Result Date: 11/10/2019   ECHOCARDIOGRAM REPORT   Patient Name:   Maurice Bell Date of Exam: 11/10/2019 Medical Rec #:  [id number]        Height:       65.0 in Accession #:    [id number]       Weight:       121.0 lb Date of Birth:  1956/09/12       BSA:          1.60 m Patient Age:    64 years         BP:           124/68 mmHg Patient Gender: M                HR:           86 bpm. Exam Location:  Inpatient Procedure: 2D Echo  Indications:    Chest pain 786.50/R07.9  History:        Patient has no prior history of Echocardiogram examinations.                 Risk Factors:Hypertension. ESRD.  Sonographer:    Clayton Lefort RDCS (AE) Referring Phys: [id number] [place]  1. Left ventricular ejection fraction, by visual estimation, is 60 to 65%. The left ventricle has normal function. There is severely increased left ventricular hypertrophy.  2. Moderate pericardial effusion, measuring up to 1.1cm adjacent to LV lateral wall and up to 1.6cm adjacent to RA. No evidence of tamponade with no RA or RV collapse seen, no significant mitral inflow respiration variation, and IVC is small/collapsible.  3. Global right ventricle has normal systolic function.The right ventricular size is normal. No increase in right ventricular wall thickness.  4. Left atrial size was normal.  5. Right atrial size was normal.  6. The mitral valve is normal in structure. No evidence of mitral valve regurgitation.  7. The tricuspid valve is normal in structure.  8. The aortic valve is tricuspid. Aortic valve regurgitation is mild. Mild aortic valve sclerosis without stenosis.  9. The pulmonic valve was normal in structure. Pulmonic valve regurgitation is trivial. 10. There is mild dilatation of the ascending aorta measuring 37 mm. 11. The inferior vena cava is normal in size with greater than 50% respiratory variability, suggesting right atrial pressure of 3 mmHg. FINDINGS  Left Ventricle: Left ventricular ejection fraction, by visual estimation, is 60 to 65%. The left ventricle has normal function. The left ventricle has no regional wall motion abnormalities. There is severely increased left ventricular hypertrophy. Left ventricular diastolic parameters were normal. Right Ventricle: The right ventricular size is normal. No increase in right ventricular wall thickness. Global RV systolic function is has normal systolic function. The tricuspid regurgitant  velocity is 1.65 m/s, and with an assumed right atrial pressure  of 3 mmHg, the estimated right ventricular systolic pressure is normal at 13.9 mmHg. Left Atrium: Left atrial size was normal in size. Right Atrium: Right atrial size was normal in size Pericardium: A moderately sized pericardial effusion is present. Moderate pericardial effusion, measuring up to 1.1cm adjacent to LV lateral wall and up to 1.7cm adjacent to RA. No evidence of tamponade  with no RA or RV collapse seen, no significant respiratory mitral inflow variation, and IVC is small/collapsible. There is a small pleural effusion in the left lateral region. Mitral Valve: The mitral valve is normal in structure. No evidence of mitral valve regurgitation. MV peak gradient, 3.0 mmHg. Tricuspid Valve: The tricuspid valve is normal in structure. Tricuspid valve regurgitation is trivial. Aortic Valve: The aortic valve is tricuspid. Aortic valve regurgitation is mild. Aortic regurgitation PHT measures 928 msec. Mild aortic valve sclerosis is present, with no evidence of aortic valve stenosis. Aortic valve mean gradient measures 6.0 mmHg. Aortic valve peak gradient measures 10.6 mmHg. Aortic valve area, by VTI measures 2.50 cm. Pulmonic Valve: The pulmonic valve was normal in structure. Pulmonic valve regurgitation is trivial. Pulmonic regurgitation is trivial. Aorta: Aortic dilatation noted. There is mild dilatation of the ascending aorta measuring 37 mm. Venous: The inferior vena cava is normal in size with greater than 50% respiratory variability, suggesting right atrial pressure of 3 mmHg. IAS/Shunts: The interatrial septum was not well visualized.  LEFT VENTRICLE PLAX 2D LVIDd:         4.06 cm  Diastology LVIDs:         2.43 cm  LV e' lateral:   5.77 cm/s LV PW:         1.70 cm  LV E/e' lateral: 9.3 LV IVS:        1.61 cm  LV e' medial:    6.85 cm/s LVOT diam:     2.10 cm  LV E/e' medial:  7.9 LV SV:         52 ml LV SV Index:   32.66 LVOT Area:      3.46 cm  RIGHT VENTRICLE             IVC RV Basal diam:  3.38 cm     IVC diam: 1.43 cm RV S prime:     11.50 cm/s TAPSE (M-mode): 2.3 cm LEFT ATRIUM             Index       RIGHT ATRIUM           Index LA diam:        2.80 cm 1.75 cm/m  RA Area:     15.70 cm LA Vol (A2C):   55.9 ml 34.98 ml/m RA Volume:   36.30 ml  22.72 ml/m LA Vol (A4C):   36.5 ml 22.84 ml/m LA Biplane Vol: 45.4 ml 28.41 ml/m  AORTIC VALVE AV Area (Vmax):    2.42 cm AV Area (Vmean):   2.21 cm AV Area (VTI):     2.50 cm AV Vmax:           163.00 cm/s AV Vmean:          112.000 cm/s AV VTI:            0.299 m AV Peak Grad:      10.6 mmHg AV Mean Grad:      6.0 mmHg LVOT Vmax:         114.00 cm/s LVOT Vmean:        71.600 cm/s LVOT VTI:          0.216 m LVOT/AV VTI ratio: 0.72 AI PHT:            928 msec  AORTA Ao Root diam: 3.50 cm Ao Asc diam:  3.70 cm MITRAL VALVE  TRICUSPID VALVE MV Area (PHT): 2.87 cm             TR Peak grad:   10.9 mmHg MV Peak grad:  3.0 mmHg             TR Vmax:        165.00 cm/s MV Mean grad:  1.0 mmHg MV Vmax:       0.86 m/s             SHUNTS MV Vmean:      51.1 cm/s            Systemic VTI:  0.22 m MV VTI:        0.23 m               Systemic Diam: 2.10 cm MV PHT:        76.56 msec MV Decel Time: 264 msec MV E velocity: 53.90 cm/s 103 cm/s MV A velocity: 59.30 cm/s 70.3 cm/s MV E/A ratio:  0.91       1.5  Oswaldo Milian MD Electronically signed by Oswaldo Milian MD Signature Date/Time: 11/10/2019/1:00:27 PM    Final    XR C-ARM NO REPORT  Result Date: 10/11/2019 Please see Notes tab for imaging impression.  Disposition   Pt is being discharged home today in good condition.  Follow-up Plans & Appointments    Follow-up Information    Imogene Burn, PA-C. Go on 11/28/2019.   Specialty: Cardiology Why: @10 :45am hospital follow up with Dr. Hassell Done PA Contact information: [address] [place]  [address] [phone]           Discharge Instructions    Diet - low sodium heart healthy   Complete by: As directed    Discharge instructions   Complete by: As directed    NO HEAVY LIFTING (>10lbs) X 2 WEEKS. NO SEXUAL ACTIVITY X 2 WEEKS. NO DRIVING X 1 WEEK. NO SOAKING BATHS, HOT TUBS, POOLS, ETC., X 7 DAYS.   Increase activity slowly   Complete by: As directed       Discharge Medications   Allergies as of 11/10/2019      Reactions   Vicodin [hydrocodone-acetaminophen] Itching      Medication List    TAKE these medications   amLODipine 10 MG tablet Commonly known as: NORVASC Take 1 tablet (10 mg total) by mouth at bedtime. What changed: when to take this   aspirin 81 MG EC tablet Take 1 tablet (81 mg total) by mouth 2 (two) times daily.   cloNIDine 0.1 MG tablet Commonly known as: CATAPRES Take 1 tablet (0.1 mg total) by mouth 2 (two) times daily. What changed: when to take this   cyanocobalamin 2000 MCG tablet Take 1 tablet (2,000 mcg total) by mouth daily.   esomeprazole 20 MG capsule Commonly known as: NexIUM Take 1 capsule (20 mg total) by mouth daily.   hydrALAZINE 50 MG tablet Commonly known as: APRESOLINE Take 50 mg by mouth 2 (two) times a day.   Icy Hot 10-30 % Stck Apply 1 application topically as needed (joint pain).   Lokelma 10 g Pack packet Generic drug: sodium zirconium cyclosilicate Take 10 g by mouth every Monday.   metoprolol tartrate 50 MG tablet Commonly known as: LOPRESSOR Take 150 mg by mouth 2 (two) times daily.   multivitamin Tabs tablet Take 1 tablet by mouth at bedtime.   oxyCODONE-acetaminophen 5-325 MG tablet Commonly known as: PERCOCET/ROXICET Take 1 tablet by mouth  every 8 (eight) hours as needed for severe pain.   sevelamer carbonate 800 MG tablet Commonly known as: RENVELA Take 4 tablets (3,200 mg total) by mouth 3 (three) times daily with meals. What changed:   when to take this  additional instructions   thiamine 100 MG tablet Take 1  tablet (100 mg total) by mouth daily.   Tylenol 8 Hour Arthritis Pain 650 MG CR tablet Generic drug: acetaminophen Take 1,300 mg by mouth every 8 (eight) hours as needed for pain.   valACYclovir 500 MG tablet Commonly known as: VALTREX Take 1 tablet (500 mg total) by mouth daily.          Outstanding Labs/Studies   None  Duration of Discharge Encounter   Greater than 30 minutes including physician time.  Jarrett Soho, PA 11/10/2019, 1:05 PM

## 2019-11-11 ENCOUNTER — Telehealth: Payer: Self-pay | Admitting: Nephrology

## 2019-11-11 NOTE — Telephone Encounter (Signed)
Transition of care contact from inpatient facility  Date of discharge: 11/10/2019 Date of contact: 11/11/2019 Method: Phone Spoke to: Patient  Patient contacted to discuss transition of care from recent inpatient hospitalization. Patient was admitted to [facility] from 11/09/2019 to 11/10/2019 with discharge diagnosis of chest pain, ?pericarditis  Medication changes were reviewed. No changes.  Patient will follow up with his/her outpatient HD unit on: today.  He reports that was able to make it to dialysis today without issues. CP improving.  Other f/u needs include: none  Veneta Penton, PA-C [employer] Pager [phone]

## 2019-11-16 ENCOUNTER — Telehealth: Payer: Self-pay | Admitting: Physical Medicine and Rehabilitation

## 2019-11-16 NOTE — Telephone Encounter (Signed)
Ok to repeat bilateral L4 TF?

## 2019-11-16 NOTE — Telephone Encounter (Signed)
Ok but needs Dr. Lorin Mercy to follow after if not helping longer. He had recent heart cath as well.

## 2019-11-16 NOTE — Telephone Encounter (Signed)
Patient request another back injection states he's having severe pain &  his last one was 10/11/19.

## 2019-11-16 NOTE — Telephone Encounter (Signed)
Needs auth for [id number]. Scheduled for 1/27.

## 2019-11-20 ENCOUNTER — Other Ambulatory Visit: Payer: Self-pay | Admitting: Physician Assistant

## 2019-11-21 NOTE — Telephone Encounter (Signed)
Spoke with Kelly Splinter  from pt insurance  Auth# [id number] Effective dates 11/29/19-12/30/19

## 2019-11-28 ENCOUNTER — Ambulatory Visit: Payer: Medicare HMO | Admitting: Physician Assistant

## 2019-11-29 ENCOUNTER — Encounter: Payer: Self-pay | Admitting: Physical Medicine and Rehabilitation

## 2019-11-29 ENCOUNTER — Other Ambulatory Visit: Payer: Self-pay

## 2019-11-29 ENCOUNTER — Ambulatory Visit: Payer: Self-pay

## 2019-11-29 ENCOUNTER — Ambulatory Visit (INDEPENDENT_AMBULATORY_CARE_PROVIDER_SITE_OTHER): Payer: Medicare HMO | Admitting: Physical Medicine and Rehabilitation

## 2019-11-29 VITALS — BP 109/62 | HR 80

## 2019-11-29 DIAGNOSIS — M5416 Radiculopathy, lumbar region: Secondary | ICD-10-CM | POA: Diagnosis not present

## 2019-11-29 MED ORDER — METHYLPREDNISOLONE ACETATE 80 MG/ML IJ SUSP
40.0000 mg | Freq: Once | INTRAMUSCULAR | Status: AC
  Administered 2019-11-29: 40 mg

## 2019-11-29 NOTE — Progress Notes (Signed)
Pt states pain across the lower back and radiates into the front of both legs all the way down. Pt states last injection helped a lot and pain returned back a couple of weeks ago. Walking and standing makes pain worse. Medication helps with pain.   .Numeric Pain Rating Scale and Functional Assessment Average Pain 5   In the last MONTH (on 0-10 scale) has pain interfered with the following?  1. General activity like being  able to carry out your everyday physical activities such as walking, climbing stairs, carrying groceries, or moving a chair?  Rating(6)   +Driver, -BT, -Dye Allergies.

## 2019-11-30 ENCOUNTER — Other Ambulatory Visit: Payer: Self-pay | Admitting: Physician Assistant

## 2019-12-05 ENCOUNTER — Other Ambulatory Visit: Payer: Self-pay | Admitting: Physician Assistant

## 2019-12-06 ENCOUNTER — Telehealth: Payer: Self-pay | Admitting: Pharmacy Technician

## 2019-12-06 ENCOUNTER — Ambulatory Visit (INDEPENDENT_AMBULATORY_CARE_PROVIDER_SITE_OTHER): Payer: Medicare HMO | Admitting: Family

## 2019-12-06 ENCOUNTER — Encounter: Payer: Self-pay | Admitting: Family

## 2019-12-06 ENCOUNTER — Other Ambulatory Visit: Payer: Self-pay

## 2019-12-06 VITALS — BP 158/81 | HR 80 | Wt 123.8 lb

## 2019-12-06 DIAGNOSIS — R768 Other specified abnormal immunological findings in serum: Secondary | ICD-10-CM | POA: Insufficient documentation

## 2019-12-06 NOTE — Assessment & Plan Note (Signed)
Mr. Schnick is a 64 year old male with positive hepatitis C antibody testing and risk factors for acquiring hepatitis C including age and previous history of drug use.  Treatment nave and currently asymptomatic.  We reviewed the pathophysiology, transmission, prevention, risks if left untreated, and treatment options for hepatitis C.  Check blood work today for hepatitis C genotype, viral load, HIV, and liver function tests.  He was able to meet with pharmacy staff to complete medication assistance.  Plan for treatment with Mavyret or Epclusa pending blood work results.
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
## 2019-12-06 NOTE — Telephone Encounter (Signed)
[facility] Patient Advocate Encounter   I gathered information in case the patient needs copay assistance for a Hepatitis C medication (undetermined at day of appointment).  We will continue to follow.  [name]. Nadara Mustard [place] Patient [facility] Phone: [phone] Fax:  [phone]

## 2019-12-06 NOTE — Telephone Encounter (Signed)
[facility] Patient Advocate Encounter  Insurance verification completed.    The patient is insured through Medicare.  We will continue to follow to see if copay assistance is needed.  Jasime Westergren E. Sopheap Boehle, CPhT [facility] Patient [facility] Phone: [phone] Fax:  [phone]   

## 2019-12-06 NOTE — Patient Instructions (Signed)
Nice to meet you.  We will check your bloodwork today.  We will call you with the results pending blood work results.   Plan for follow up in 1 month after start of medication.   Limit acetaminophen (Tylenol) usage to no more than 2 grams (2,000 mg) per day.  Avoid alcohol.  Do not share toothbrushes or razors.  Practice safe sex to protect against transmission as well as sexually transmitted disease.    Hepatitis C Hepatitis C is a viral infection of the liver. It can lead to scarring of the liver (cirrhosis), liver failure, or liver cancer. Hepatitis C may go undetected for months or years because people with the infection may not have symptoms, or they may have only mild symptoms. What are the causes? This condition is caused by the hepatitis C virus (HCV). The virus can spread from person to person (is contagious) through:  Blood.  Childbirth. A woman who has hepatitis C can pass it to her baby during birth.  Bodily fluids, such as breast milk, tears, semen, vaginal fluids, and saliva.  Blood transfusions or organ transplants done in the [place] before 1992.  What increases the risk? The following factors may make you more likely to develop this condition:  Having contact with unclean (contaminated) needles or syringes. This may result from: ? Acupuncture. ? Tattoing. ? Body piercing. ? Injecting drugs.  Having unprotected sex with someone who is infected.  Needing treatment to filter your blood (kidney dialysis).  Having HIV (human immunodeficiency virus) or AIDS (acquired immunodeficiency syndrome).  Working in a job that involves contact with blood or bodily fluids, such as health care.  What are the signs or symptoms? Symptoms of this condition include:  Fatigue.  Loss of appetite.  Nausea.  Vomiting.  Abdominal pain.  Dark yellow urine.  Yellowish skin and eyes (jaundice).  Itchy skin.  Clay-colored bowel movements.  Joint  pain.  Bleeding and bruising easily.  Fluid building up in your stomach (ascites).  In some cases, you may not have any symptoms. How is this diagnosed? This condition is diagnosed with:  Blood tests.  Other tests to check how well your liver is functioning. They may include: ? Magnetic resonance elastography (MRE). This imaging test uses MRIs and sound waves to measure liver stiffness. ? Transient elastography. This imaging test uses ultrasounds to measure liver stiffness. ? Liver biopsy. This test requires taking a small tissue sample from your liver to examine it under a microscope.  How is this treated? Your health care provider may perform noninvasive tests or a liver biopsy to help decide the best course of treatment. Treatment may include:  Antiviral medicines and other medicines.  Follow-up treatments every 6-12 months for infections or other liver conditions.  Receiving a donated liver (liver transplant).  Follow these instructions at home: Medicines  Take over-the-counter and prescription medicines only as told by your health care provider.  Take your antiviral medicine as told by your health care provider. Do not stop taking the antiviral even if you start to feel better.  Do not take any medicines unless approved by your health care provider, including over-the-counter medicines and birth control pills. Activity  Rest as needed.  Do not have sex unless approved by your health care provider.  Ask your health care provider when you may return to school or work. Eating and drinking  Eat a balanced diet with plenty of fruits and vegetables, whole grains, and lowfat (lean) meats or non-meat  proteins (such as beans or tofu).  Drink enough fluids to keep your urine clear or pale yellow.  Do not drink alcohol. General instructions  Do not share toothbrushes, nail clippers, or razors.  Wash your hands frequently with soap and water. If soap and water are not  available, use hand sanitizer.  Cover any cuts or open sores on your skin to prevent spreading the virus.  Keep all follow-up visits as told by your health care provider. This is important. You may need follow-up visits every 6-12 months. How is this prevented? There is no vaccine for hepatitis C. The only way to prevent the disease is to reduce the risk of exposure to the virus. Make sure you:  Wash your hands frequently with soap and water. If soap and water are not available, use hand sanitizer.  Do not share needles or syringes.  Practice safe sex and use condoms.  Avoid handling blood or bodily fluids without gloves or other protection.  Avoid getting tattoos or piercings in shops or other locations that are not clean.  Contact a health care provider if:  You have a fever.  You develop abdominal pain.  You pass dark urine.  You pass clay-colored stools.  You develop joint pain. Get help right away if:  You have increasing fatigue or weakness.  You lose your appetite.  You cannot eat or drink without vomiting.  You develop jaundice or your jaundice gets worse.  You bruise or bleed easily. Summary  Hepatitis C is a viral infection of the liver. It can lead to scarring of the liver (cirrhosis), liver failure, or liver cancer.  The hepatitis C virus (HCV) causes this condition. The virus can pass from person to person (is contagious).  You should not take any medicines unless approved by your health care provider. This includes over-the-counter medicines and birth control pills. This information is not intended to replace advice given to you by your health care provider. Make sure you discuss any questions you have with your health care provider. Document Released: 10/16/2000 Document Revised: 11/24/2016 Document Reviewed: 11/24/2016 Elsevier Interactive Patient Education  [employer].

## 2019-12-10 LAB — LIVER FIBROSIS, FIBROTEST-ACTITEST
ALT: 16 U/L (ref 9–46)
Alpha-2-Macroglobulin: 298 mg/dL — ABNORMAL HIGH (ref 106–279)
Apolipoprotein A1: 142 mg/dL (ref 94–176)
Bilirubin: 0.4 mg/dL (ref 0.2–1.2)
Fibrosis Score: 0.5
GGT: 34 U/L (ref 3–70)
Haptoglobin: 88 mg/dL (ref 43–212)
Necroinflammat ACT Score: 0.07
Reference ID: 3258053

## 2019-12-10 LAB — HEPATIC FUNCTION PANEL
AG Ratio: 1.3 (calc) (ref 1.0–2.5)
ALT: 16 U/L (ref 9–46)
AST: 23 U/L (ref 10–35)
Albumin: 4.2 g/dL (ref 3.6–5.1)
Alkaline phosphatase (APISO): 72 U/L (ref 35–144)
Bilirubin, Direct: 0.1 mg/dL (ref 0.0–0.2)
Globulin: 3.2 g/dL (calc) (ref 1.9–3.7)
Indirect Bilirubin: 0.3 mg/dL (calc) (ref 0.2–1.2)
Total Bilirubin: 0.4 mg/dL (ref 0.2–1.2)
Total Protein: 7.4 g/dL (ref 6.1–8.1)

## 2019-12-10 LAB — CBC
HCT: 29 % — ABNORMAL LOW (ref 38.5–50.0)
Hemoglobin: 9.9 g/dL — ABNORMAL LOW (ref 13.2–17.1)
MCH: 36.1 pg — ABNORMAL HIGH (ref 27.0–33.0)
MCHC: 34.1 g/dL (ref 32.0–36.0)
MCV: 105.8 fL — ABNORMAL HIGH (ref 80.0–100.0)
MPV: 11.1 fL (ref 7.5–12.5)
Platelets: 154 10*3/uL (ref 140–400)
RBC: 2.74 10*6/uL — ABNORMAL LOW (ref 4.20–5.80)
RDW: 14.4 % (ref 11.0–15.0)
WBC: 4 10*3/uL (ref 3.8–10.8)

## 2019-12-10 LAB — HEPATITIS C RNA QUANTITATIVE
HCV Quantitative Log: 6.09 Log IU/mL — ABNORMAL HIGH
HCV RNA, PCR, QN: 1230000 IU/mL — ABNORMAL HIGH

## 2019-12-10 LAB — HIV ANTIBODY (ROUTINE TESTING W REFLEX): HIV 1&2 Ab, 4th Generation: NONREACTIVE

## 2019-12-10 LAB — HEPATITIS C GENOTYPE
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
## 2019-12-13 ENCOUNTER — Ambulatory Visit (INDEPENDENT_AMBULATORY_CARE_PROVIDER_SITE_OTHER): Payer: Medicare HMO | Admitting: Physician Assistant

## 2019-12-13 ENCOUNTER — Encounter: Payer: Self-pay | Admitting: Physician Assistant

## 2019-12-13 ENCOUNTER — Other Ambulatory Visit: Payer: Self-pay

## 2019-12-13 VITALS — BP 142/70 | HR 81 | Ht 65.0 in | Wt 123.0 lb

## 2019-12-13 DIAGNOSIS — I10 Essential (primary) hypertension: Secondary | ICD-10-CM

## 2019-12-13 DIAGNOSIS — Z720 Tobacco use: Secondary | ICD-10-CM

## 2019-12-13 DIAGNOSIS — N186 End stage renal disease: Secondary | ICD-10-CM

## 2019-12-13 DIAGNOSIS — I309 Acute pericarditis, unspecified: Secondary | ICD-10-CM | POA: Diagnosis not present

## 2019-12-13 DIAGNOSIS — Z8673 Personal history of transient ischemic attack (TIA), and cerebral infarction without residual deficits: Secondary | ICD-10-CM

## 2019-12-13 DIAGNOSIS — Z992 Dependence on renal dialysis: Secondary | ICD-10-CM

## 2019-12-13 MED ORDER — NICOTINE 7 MG/24HR TD PT24: MEDICATED_PATCH | TRANSDERMAL | 0 refills | Status: DC

## 2019-12-13 MED ORDER — NICOTINE 14 MG/24HR TD PT24: MEDICATED_PATCH | TRANSDERMAL | 0 refills | Status: DC

## 2019-12-13 NOTE — Patient Instructions (Addendum)
Medication Instructions:  Your physician has recommended you make the following change in your medication:   START: nicotine patches- use as directed  If you need a refill on your cardiac medications before your next appointment, please call your pharmacy.   Lab work: None Ordered  If you have labs (blood work) drawn today and your tests are completely normal, you will receive your results only by: Marland Kitchen MyChart Message (if you have MyChart) OR . A paper copy in the mail If you have any lab test that is abnormal or we need to change your treatment, we will call you to review the results.  Testing/Procedures: None ordered  Follow-Up: . Follow up with Dr. Irish Lack on 02/13/20 at 11:20 AM  Any Other Special Instructions Will Be Listed Below (If Applicable).

## 2019-12-15 ENCOUNTER — Encounter: Payer: Self-pay | Admitting: Internal Medicine

## 2020-01-10 ENCOUNTER — Other Ambulatory Visit: Payer: Self-pay | Admitting: Pharmacist

## 2020-01-10 DIAGNOSIS — R768 Other specified abnormal immunological findings in serum: Secondary | ICD-10-CM

## 2020-01-10 MED ORDER — SOFOSBUVIR-VELPATASVIR 400-100 MG PO TABS: 1.0000 | ORAL_TABLET | Freq: Every day | ORAL | 2 refills | Status: DC

## 2020-01-10 NOTE — Progress Notes (Addendum)
Patient's labs have returned.  Sent in Rober[place] for his chronic hepatitis C.  Adonis Brook will begin PA process.

## 2020-01-12 ENCOUNTER — Telehealth: Payer: Self-pay | Admitting: Pharmacy Technician

## 2020-01-12 NOTE — Telephone Encounter (Addendum)
[facility] Patient Advocate Encounter   Received notification from [facility] that prior authorization for Raeanne Gathers is required.   PA submitted on 01/12/2020 Key [id number] Status is pending (p) [phone] 3-7 business days    [facility] will continue to follow. *Despite filling out the PA for brand Epclusa knowing that was insurance preference. They denied stating they will not cover generic. Will fill out appeal for brand and refax.  Bartholomew Crews, CPhT Specialty Pharmacy Patient [facility] Phone: [phone] Fax: [phone] 01/12/2020 12:06 PM

## 2020-01-15 NOTE — Telephone Encounter (Signed)
[facility] Patient Advocate Encounter  Prior Authorization for Maurice Bell has been approved. Insurance still processing for generic.   Effective dates: 01/15/2020 through 04/05/2020  Patients co-pay is $3.64.   [facility] will continue to follow. Left voicemail to set up patient's first shipment of medication.  Bartholomew Crews, CPhT Specialty Pharmacy Patient [facility] for Infectious Disease Phone: [phone] Fax: [phone] 01/15/2020 10:49 AM

## 2020-01-18 ENCOUNTER — Telehealth: Payer: Self-pay

## 2020-01-18 ENCOUNTER — Encounter: Payer: Self-pay | Admitting: Pharmacy Technician

## 2020-01-18 MED FILL — SOFOSBUVIR-VELPATASVIR 400-: 400-100 | 28 days supply | Qty: 28 | Fill #0

## 2020-01-18 NOTE — Telephone Encounter (Signed)
Agree with Greg's assessment.

## 2020-01-18 NOTE — Telephone Encounter (Signed)
[facility] Patient Advocate Encounter   I was successful in securing patient a $30,000 grant from [employer] (PAF) to provide copayment coverage for Epclusa. This will make the out of pocket cost $0.     I have spoken with the patient.    The billing information is as follows and has been shared with [place].   RxBin: [id number] PCN: [id number] Member ID: [id number] Group ID: [id number] Dates of Eligibility: 12/19/2019 through 12/17/2020  Patient knows to call the office with questions or concerns.  Bartholomew Crews, CPhT Specialty Pharmacy Patient [facility] for Infectious Disease Phone: [phone] Fax: [phone] 01/18/2020 11:55 AM

## 2020-02-13 ENCOUNTER — Ambulatory Visit: Payer: Medicare HMO | Admitting: Interventional Cardiology

## 2020-02-16 MED FILL — SOFOSBUVIR-VELPATASVIR 400-: 400-100 | 28 days supply | Qty: 28 | Fill #1
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
## 2020-02-19 ENCOUNTER — Ambulatory Visit (INDEPENDENT_AMBULATORY_CARE_PROVIDER_SITE_OTHER): Payer: Medicare HMO | Admitting: Interventional Cardiology

## 2020-02-19 ENCOUNTER — Other Ambulatory Visit: Payer: Self-pay

## 2020-02-19 ENCOUNTER — Encounter: Payer: Self-pay | Admitting: Interventional Cardiology

## 2020-02-19 VITALS — BP 142/70 | HR 47 | Ht 65.0 in | Wt 120.4 lb

## 2020-02-19 DIAGNOSIS — I10 Essential (primary) hypertension: Secondary | ICD-10-CM

## 2020-02-19 DIAGNOSIS — N186 End stage renal disease: Secondary | ICD-10-CM | POA: Diagnosis not present

## 2020-02-19 DIAGNOSIS — Z992 Dependence on renal dialysis: Secondary | ICD-10-CM

## 2020-02-19 DIAGNOSIS — Z720 Tobacco use: Secondary | ICD-10-CM

## 2020-02-19 DIAGNOSIS — I309 Acute pericarditis, unspecified: Secondary | ICD-10-CM

## 2020-02-19 DIAGNOSIS — Z8673 Personal history of transient ischemic attack (TIA), and cerebral infarction without residual deficits: Secondary | ICD-10-CM

## 2020-02-19 NOTE — Patient Instructions (Signed)

## 2020-02-26 ENCOUNTER — Ambulatory Visit: Payer: Medicare HMO | Admitting: Pharmacist

## 2020-02-27 ENCOUNTER — Other Ambulatory Visit: Payer: Self-pay

## 2020-02-27 ENCOUNTER — Emergency Department (HOSPITAL_COMMUNITY): Payer: Medicare HMO

## 2020-02-27 ENCOUNTER — Encounter (HOSPITAL_COMMUNITY): Payer: Self-pay | Admitting: Emergency Medicine

## 2020-02-27 ENCOUNTER — Emergency Department (HOSPITAL_COMMUNITY)
Admission: EM | Admit: 2020-02-27 | Discharge: 2020-02-27 | Disposition: A | Discharge status: Home / Self Care | Payer: Medicare HMO | Attending: Emergency Medicine | Admitting: Emergency Medicine

## 2020-02-27 DIAGNOSIS — R0989 Other specified symptoms and signs involving the circulatory and respiratory systems: Secondary | ICD-10-CM

## 2020-02-27 DIAGNOSIS — Z7982 Long term (current) use of aspirin: Secondary | ICD-10-CM | POA: Insufficient documentation

## 2020-02-27 DIAGNOSIS — E875 Hyperkalemia: Secondary | ICD-10-CM | POA: Diagnosis not present

## 2020-02-27 DIAGNOSIS — N186 End stage renal disease: Secondary | ICD-10-CM | POA: Diagnosis not present

## 2020-02-27 DIAGNOSIS — R0602 Shortness of breath: Secondary | ICD-10-CM

## 2020-02-27 DIAGNOSIS — Z992 Dependence on renal dialysis: Secondary | ICD-10-CM | POA: Diagnosis not present

## 2020-02-27 DIAGNOSIS — Z20822 Contact with and (suspected) exposure to covid-19: Secondary | ICD-10-CM | POA: Diagnosis not present

## 2020-02-27 DIAGNOSIS — I120 Hypertensive chronic kidney disease with stage 5 chronic kidney disease or end stage renal disease: Secondary | ICD-10-CM | POA: Insufficient documentation

## 2020-02-27 DIAGNOSIS — Z79899 Other long term (current) drug therapy: Secondary | ICD-10-CM | POA: Insufficient documentation

## 2020-02-27 LAB — COMPREHENSIVE METABOLIC PANEL
ALT: 16 U/L (ref 0–44)
AST: 29 U/L (ref 15–41)
Albumin: 4 g/dL (ref 3.5–5.0)
Alkaline Phosphatase: 61 U/L (ref 38–126)
Anion gap: 16 — ABNORMAL HIGH (ref 5–15)
BUN: 54 mg/dL — ABNORMAL HIGH (ref 8–23)
CO2: 23 mmol/L (ref 22–32)
Calcium: 9 mg/dL (ref 8.9–10.3)
Chloride: 88 mmol/L — ABNORMAL LOW (ref 98–111)
Creatinine, Ser: 9.06 mg/dL — ABNORMAL HIGH (ref 0.61–1.24)
GFR calc Af Amer: 7 mL/min — ABNORMAL LOW (ref >60)
GFR calc non Af Amer: 6 mL/min — ABNORMAL LOW (ref >60)
Glucose, Bld: 91 mg/dL (ref 70–99)
Potassium: 6 mmol/L — ABNORMAL HIGH (ref 3.5–5.1)
Sodium: 127 mmol/L — ABNORMAL LOW (ref 135–145)
Total Bilirubin: 0.7 mg/dL (ref 0.3–1.2)
Total Protein: 7.3 g/dL (ref 6.5–8.1)

## 2020-02-27 LAB — CBC WITH DIFFERENTIAL/PLATELET
Abs Immature Granulocytes: 0.02 10*3/uL (ref 0.00–0.07)
Basophils Absolute: 0.1 10*3/uL (ref 0.0–0.1)
Basophils Relative: 1 %
Eosinophils Absolute: 0.2 10*3/uL (ref 0.0–0.5)
Eosinophils Relative: 3 %
HCT: 30.9 % — ABNORMAL LOW (ref 39.0–52.0)
Hemoglobin: 10 g/dL — ABNORMAL LOW (ref 13.0–17.0)
Immature Granulocytes: 0 %
Lymphocytes Relative: 10 %
Lymphs Abs: 0.6 10*3/uL — ABNORMAL LOW (ref 0.7–4.0)
MCH: 35.5 pg — ABNORMAL HIGH (ref 26.0–34.0)
MCHC: 32.4 g/dL (ref 30.0–36.0)
MCV: 109.6 fL — ABNORMAL HIGH (ref 80.0–100.0)
Monocytes Absolute: 0.5 10*3/uL (ref 0.1–1.0)
Monocytes Relative: 8 %
Neutro Abs: 4.8 10*3/uL (ref 1.7–7.7)
Neutrophils Relative %: 78 %
Platelets: 111 10*3/uL — ABNORMAL LOW (ref 150–400)
RBC: 2.82 MIL/uL — ABNORMAL LOW (ref 4.22–5.81)
RDW: 14.3 % (ref 11.5–15.5)
WBC: 6.2 10*3/uL (ref 4.0–10.5)
nRBC: 0 % (ref 0.0–0.2)

## 2020-02-27 LAB — RESPIRATORY PANEL BY RT PCR (FLU A&B, COVID)
Influenza A by PCR: NEGATIVE
Influenza B by PCR: NEGATIVE
SARS Coronavirus 2 by RT PCR: NEGATIVE

## 2020-02-27 MED ORDER — SODIUM CHLORIDE 0.9 % IV SOLN: 100.0000 mL | INTRAVENOUS | Status: DC | PRN

## 2020-02-27 MED ORDER — PENTAFLUOROPROP-TETRAFLUOROETH EX AERO
1.0000 "application " | INHALATION_SPRAY | CUTANEOUS | Status: DC | PRN
  Filled 2020-02-27: qty 116

## 2020-02-27 MED ORDER — SODIUM CHLORIDE 0.9 % IV SOLN
1.0000 g | Freq: Once | INTRAVENOUS | Status: DC
  Filled 2020-02-27: qty 10

## 2020-02-27 MED ORDER — LIDOCAINE HCL (PF) 1 % IJ SOLN: 5.0000 mL | INTRAMUSCULAR | Status: DC | PRN

## 2020-02-27 MED ORDER — HEPARIN SODIUM (PORCINE) 1000 UNIT/ML DIALYSIS
1000.0000 [IU] | INTRAMUSCULAR | Status: DC | PRN
  Filled 2020-02-27: qty 1

## 2020-02-27 MED ORDER — LIDOCAINE-PRILOCAINE 2.5-2.5 % EX CREA
1.0000 "application " | TOPICAL_CREAM | CUTANEOUS | Status: DC | PRN
  Filled 2020-02-27: qty 5
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
## 2020-02-27 NOTE — ED Notes (Addendum)
Pt was seen by EDP. NT was able to remove the IV from pt.  Pt removed cuff when NT attempted to get discharge vitals and left without discharge papers.

## 2020-02-27 NOTE — Discharge Instructions (Signed)
Please keep your regularly scheduled dialysis appointment.  Return to the emergency department any new or suddenly worsening symptoms.

## 2020-02-27 NOTE — Progress Notes (Signed)
Tolerated treatment with no issues, 4029 ml removed, post HD VSS, no complaints or issues. Report called to [facility] in Emergency department

## 2020-02-27 NOTE — ED Provider Notes (Signed)
Blood pressure (!) 173/97, pulse 89, temperature 98.8 F (37.1 C), temperature source Oral, resp. rate 18, weight 55 kg, SpO2 99 %.  In short, Maurice Bell is a 64 y.o. male with a chief complaint of Shortness of Breath .  Refer to the original H&P for additional details.  6:30 PM Patient feeling much better after dialysis.  He still has a mild cough but nothing like when he came in.  He is feeling well and asking to leave.  He has his ride here and is ready to go.      Margette Fast, MD 02/27/20 [phone]

## 2020-02-27 NOTE — Procedures (Signed)
Patient seen on Hemodialysis. BP (!) 177/90   Pulse 82   Temp 98.7 F (37.1 C) (Oral)   Resp 20   Wt 54.6 kg   SpO2 98%   BMI 20.03 kg/m   QB 400, UF goal 4.5L Tolerating treatment without complaints at this time.   Elmarie Shiley MD [facility]. Office # [phone] Pager # [phone] 2:41 PM

## 2020-02-27 NOTE — ED Triage Notes (Signed)
Pt in by [facility] with sob, states it got worse last night when he went to bed. Pt has [facility]Th-S dialysis, last done on Sat. Reporting cough as well, clear sputum. EMS states sats were 86% on RA, placed on 2L. Denies any cp, n/v or fevers. Had both Covid shots

## 2020-02-27 NOTE — Progress Notes (Signed)
Nephrology Quick Note  Aware that patient in ED with SOB + hyperkalemia in setting of missed HD.  Dr. Posey Pronto placed HD orders for today - will go back to ED afterwards.  Hopefully after dialysis, he will feel well enough for discharge.  Pls let our team know if this changes and he will be fully admitted.  Veneta Penton, PA-C [employer] Pager [phone]

## 2020-03-18 NOTE — Progress Notes (Signed)
Maurice Bell - 64 y.o. male MRN [id number]  Date of birth: [birth year]  Office Visit Note: Visit Date: 11/29/2019 PCP: Patient, No Pcp Per Referred by: No ref. provider found  Subjective: Chief Complaint  Patient presents with  . Lower Back - Pain  . Right Leg - Pain  . Left Leg - Pain   HPI:  Maurice Bell is a 64 y.o. male who comes in today At the request of Dr. Rodell Perna for planned Bilateral L4-L5 lumbar epidural steroid injection with fluoroscopic guidance.  The patient has failed conservative care including home exercise, medications, time and activity modification.  This injection will be diagnostic and hopefully therapeutic.  Please see requesting physician notes for further details and justification.   Case is complicated by end-stage renal disease with significant cardiovascular complications.   ROS Otherwise per HPI.  Assessment & Plan: Visit Diagnoses:  1. Lumbar radiculopathy     Plan: No additional findings.   Meds & Orders:  Meds ordered this encounter  Medications  . methylPREDNISolone acetate (DEPO-MEDROL) injection 40 mg    Orders Placed This Encounter  Procedures  . XR C-ARM NO REPORT  . Epidural Steroid injection    Follow-up: No follow-ups on file.   Procedures: No procedures performed  Lumbosacral Transforaminal Epidural Steroid Injection - Sub-Pedicular Approach with Fluoroscopic Guidance  Patient: Maurice Bell      Date of Birth: Dec 04, 1955 MRN: [id number] PCP: Patient, No Pcp Per      Visit Date: 11/29/2019   Universal Protocol:    Date/Time: 11/29/2019  Consent Given By: the patient  Position: PRONE  Additional Comments: Vital signs were monitored before and after the procedure. Patient was prepped and draped in the usual sterile fashion. The correct patient, procedure, and site was verified.   Injection Procedure Details:  Procedure Site One Meds Administered:  Meds ordered this encounter  Medications  .  methylPREDNISolone acetate (DEPO-MEDROL) injection 40 mg    Laterality: Bilateral  Location/Site:  L4-L5  Needle size: 22 G  Needle type: Spinal  Needle Placement: Transforaminal  Findings:    -Comments: Excellent flow of contrast along the nerve and into the epidural space.  Procedure Details: After squaring off the end-plates to get a true AP view, the C-arm was positioned so that an oblique view of the foramen as noted above was visualized. The target area is just inferior to the "nose of the scotty dog" or sub pedicular. The soft tissues overlying this structure were infiltrated with 2-3 ml. of 1% Lidocaine without Epinephrine.  The spinal needle was inserted toward the target using a "trajectory" view along the fluoroscope beam.  Under AP and lateral visualization, the needle was advanced so it did not puncture dura and was located close the 6 O'Clock position of the pedical in AP tracterory. Biplanar projections were used to confirm position. Aspiration was confirmed to be negative for CSF and/or blood. A 1-2 ml. volume of Isovue-250 was injected and flow of contrast was noted at each level. Radiographs were obtained for documentation purposes.   After attaining the desired flow of contrast documented above, a 0.5 to 1.0 ml test dose of 0.25% Marcaine was injected into each respective transforaminal space.  The patient was observed for 90 seconds post injection.  After no sensory deficits were reported, and normal lower extremity motor function was noted,   the above injectate was administered so that equal amounts of the injectate were placed at each foramen (level) into the transforaminal epidural space.  Additional Comments:  The patient tolerated the procedure well Dressing: 2 x 2 sterile gauze and Band-Aid    Post-procedure details: Patient was observed during the procedure. Post-procedure instructions were reviewed.  Patient left the clinic in stable condition.      Clinical History: MRI LUMBAR SPINE WITHOUT CONTRAST  TECHNIQUE: Multiplanar, multisequence MR imaging of the lumbar spine was performed. No intravenous contrast was administered.  COMPARISON:  Lumbar radiographs today, and 08/15/2018. CT Chest, Abdomen, and Pelvis 03/12/2019.  FINDINGS: Segmentation: 12 full size ribs on the May CT Chest, Abdomen, and Pelvis. Subsequently these radiographs demonstrate 4 lumbar type vertebral bodies with a sacralized L5. Correlation with radiographs is recommended prior to any operative intervention.  Alignment: Chronic straightening of lumbar lordosis. No significant spondylolisthesis.  Vertebrae: Diffusely abnormal T1 bone marrow signal with abnormal sclerosis resembling renal osteodystrophy on the comparison CT. Chronic endplate deformities resembling bulky Schmorl's nodes throughout. Chronic mild compression of the superior L1 body. Partially visible metal implant or retained foreign body at the right SI joint. Intact visible sacrum. No acute osseous abnormality identified.  Conus medullaris and cauda equina: Conus extends to the T12-L1 level. No lower spinal cord or conus signal abnormality.  Paraspinal and other soft tissues: Native renal atrophy with polycystic kidney disease. Decreased T2 signal in the visible liver and spleen. Paraspinal soft tissues are within normal limits.  Disc levels:  No lower thoracic spinal stenosis.  T12-L1:  Mild disc bulge.  No stenosis.  L1-L2:  Mild disc bulge.  No stenosis.  L2-L3: Circumferential disc bulge. Mild posterior element hypertrophy and endplate spurring. No significant spinal or lateral recess stenosis. Mild bilateral L2 foraminal stenosis.  L3-L4: Circumferential disc bulge with. Mild broad-based posterior component to moderate posterior element hypertrophy. Mild epidural lipomatosis. Mild to moderate spinal stenosis (series 8, image 25). Moderate bilateral L3  foraminal stenosis.  L4-L5: Collapse of the L4-L5 disc space since October 2019. circumferential disc osteophyte complex eccentric to the left. Mild posterior element hypertrophy. No significant spinal stenosis at this level, but there is mild to moderate left lateral recess stenosis (left L5 nerve level). There is severe bilateral L4 foraminal stenosis, probably greater on the left.  L5-S1:  Sacralized, vestigial L5-S1 disc space.  No stenosis.  IMPRESSION: 1. Transitional lumbosacral anatomy as demonstrated on the CT Chest, Abdomen, and Pelvis in May. Sacralized L5 level with vestigial L5-S1 disc. 2. 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  3. Collapse of the L4-L5 disc space since October 2019. No significant spinal stenosis at that level, but severe bilateral neural foraminal stenosis and up to moderate left lateral recess stenosis. 4. L3-L4 mild to moderate spinal and bilateral foraminal stenosis. 5. Hemosiderosis.  Polycystic renal disease.   Electronically Signed   By: Genevie Ann M.D.   On: 05/12/2019 20:20     Objective:  VS:  HT:    WT:   BMI:     BP:109/62  HR:80bpm  TEMP: ( )  RESP:  Physical Exam   Imaging: No results found.

## 2020-03-18 NOTE — Procedures (Signed)
Lumbosacral Transforaminal Epidural Steroid Injection - Sub-Pedicular Approach with Fluoroscopic Guidance  Patient: Maurice Bell      Date of Birth: 23-Jan-1956 MRN: [id number] PCP: Patient, No Pcp Per      Visit Date: 11/29/2019   Universal Protocol:    Date/Time: 11/29/2019  Consent Given By: the patient  Position: PRONE  Additional Comments: Vital signs were monitored before and after the procedure. Patient was prepped and draped in the usual sterile fashion. The correct patient, procedure, and site was verified.   Injection Procedure Details:  Procedure Site One Meds Administered:  Meds ordered this encounter  Medications  . methylPREDNISolone acetate (DEPO-MEDROL) injection 40 mg    Laterality: Bilateral  Location/Site:  L4-L5  Needle size: 22 G  Needle type: Spinal  Needle Placement: Transforaminal  Findings:    -Comments: Excellent flow of contrast along the nerve and into the epidural space.  Procedure Details: After squaring off the end-plates to get a true AP view, the C-arm was positioned so that an oblique view of the foramen as noted above was visualized. The target area is just inferior to the "nose of the scotty dog" or sub pedicular. The soft tissues overlying this structure were infiltrated with 2-3 ml. of 1% Lidocaine without Epinephrine.  The spinal needle was inserted toward the target using a "trajectory" view along the fluoroscope beam.  Under AP and lateral visualization, the needle was advanced so it did not puncture dura and was located close the 6 O'Clock position of the pedical in AP tracterory. Biplanar projections were used to confirm position. Aspiration was confirmed to be negative for CSF and/or blood. A 1-2 ml. volume of Isovue-250 was injected and flow of contrast was noted at each level. Radiographs were obtained for documentation purposes.   After attaining the desired flow of contrast documented above, a 0.5 to 1.0 ml test dose  of 0.25% Marcaine was injected into each respective transforaminal space.  The patient was observed for 90 seconds post injection.  After no sensory deficits were reported, and normal lower extremity motor function was noted,   the above injectate was administered so that equal amounts of the injectate were placed at each foramen (level) into the transforaminal epidural space.   Additional Comments:  The patient tolerated the procedure well Dressing: 2 x 2 sterile gauze and Band-Aid    Post-procedure details: Patient was observed during the procedure. Post-procedure instructions were reviewed.  Patient left the clinic in stable condition.

## 2020-03-20 MED FILL — SOFOSBUVIR-VELPATASVIR 400-: 400-100 | 28 days supply | Qty: 28 | Fill #2

## 2020-03-29 ENCOUNTER — Other Ambulatory Visit: Payer: Self-pay | Admitting: Physician Assistant

## 2020-04-22 ENCOUNTER — Other Ambulatory Visit: Payer: Self-pay | Admitting: Pharmacist

## 2020-04-22 DIAGNOSIS — R768 Other specified abnormal immunological findings in serum: Secondary | ICD-10-CM

## 2020-04-22 NOTE — Telephone Encounter (Signed)
FYI, he filled with our pharmacy on these dates:  01/18/20 02/16/20 03/20/20  He should be finished.  [name]. [name] Patient Maurice Bell Phone: [phone] Fax:  [phone]

## 2020-04-23 NOTE — Telephone Encounter (Signed)
[facility] is aware that he is finished with the medication.  Thanks Dorie!  [name]. Nadara Mustard [place] Patient [facility] Phone: [phone] Fax:  [phone]

## 2020-05-03 ENCOUNTER — Other Ambulatory Visit: Payer: Self-pay | Admitting: Physician Assistant

## 2020-06-10 ENCOUNTER — Telehealth: Payer: Self-pay | Admitting: Physical Medicine and Rehabilitation

## 2020-06-10 NOTE — Telephone Encounter (Signed)
Pt would like to get scheduled for a steroid injection  [phone]

## 2020-06-10 NOTE — Telephone Encounter (Signed)
Bilateral L4 TF on 11/29/19. Ok to repeat if helped, same problem/side, and no new injury?

## 2020-06-10 NOTE — Telephone Encounter (Signed)
ok 

## 2020-06-11 NOTE — Telephone Encounter (Signed)
Needs auth for [phone] and scheduling. Needs Monday or Wednesday.

## 2020-06-20 NOTE — Telephone Encounter (Signed)
Any updates on auth for this one?

## 2020-06-21 NOTE — Telephone Encounter (Signed)
Scheduled for 8/30 at 0845 with driver.

## 2020-06-21 NOTE — Telephone Encounter (Signed)
Pt Auth# has been approve, pt can be sch, Auth# [id number]

## 2020-07-01 ENCOUNTER — Ambulatory Visit: Payer: Self-pay

## 2020-07-01 ENCOUNTER — Encounter: Payer: Self-pay | Admitting: Physical Medicine and Rehabilitation

## 2020-07-01 ENCOUNTER — Ambulatory Visit (INDEPENDENT_AMBULATORY_CARE_PROVIDER_SITE_OTHER): Payer: Medicare HMO | Admitting: Physical Medicine and Rehabilitation

## 2020-07-01 ENCOUNTER — Other Ambulatory Visit: Payer: Self-pay

## 2020-07-01 VITALS — BP 171/87 | HR 84

## 2020-07-01 DIAGNOSIS — M5416 Radiculopathy, lumbar region: Secondary | ICD-10-CM | POA: Diagnosis not present

## 2020-07-01 MED ORDER — METHYLPREDNISOLONE ACETATE 80 MG/ML IJ SUSP
80.0000 mg | Freq: Once | INTRAMUSCULAR | Status: AC
Start: 2020-07-01 — End: 2020-07-01
  Administered 2020-07-01: 80 mg

## 2020-07-01 NOTE — Procedures (Signed)
Lumbosacral Transforaminal Epidural Steroid Injection - Sub-Pedicular Approach with Fluoroscopic Guidance  Patient: Maurice Bell      Date of Birth: 05-Nov-1955 MRN: [id number] PCP: Patient, No Pcp Per      Visit Date: 07/01/2020   Universal Protocol:    Date/Time: 07/01/2020  Consent Given By: the patient  Position: PRONE  Additional Comments: Vital signs were monitored before and after the procedure. Patient was prepped and draped in the usual sterile fashion. The correct patient, procedure, and site was verified.   Injection Procedure Details:  Procedure Site One Meds Administered:  Meds ordered this encounter  Medications  . methylPREDNISolone acetate (DEPO-MEDROL) injection 80 mg    Laterality: Bilateral  Location/Site:  Sacralized vestigial L5 segment L4-L5  Needle size: 22 G  Needle type: Spinal  Needle Placement: Transforaminal  Findings:    -Comments: Excellent flow of contrast along the nerve, nerve root and into the epidural space.  Procedure Details: After squaring off the end-plates to get a true AP view, the C-arm was positioned so that an oblique view of the foramen as noted above was visualized. The target area is just inferior to the "nose of the scotty dog" or sub pedicular. The soft tissues overlying this structure were infiltrated with 2-3 ml. of 1% Lidocaine without Epinephrine.  The spinal needle was inserted toward the target using a "trajectory" view along the fluoroscope beam.  Under AP and lateral visualization, the needle was advanced so it did not puncture dura and was located close the 6 O'Clock position of the pedical in AP tracterory. Biplanar projections were used to confirm position. Aspiration was confirmed to be negative for CSF and/or blood. A 1-2 ml. volume of Isovue-250 was injected and flow of contrast was noted at each level. Radiographs were obtained for documentation purposes.   After attaining the desired flow of contrast  documented above, a 0.5 to 1.0 ml test dose of 0.25% Marcaine was injected into each respective transforaminal space.  The patient was observed for 90 seconds post injection.  After no sensory deficits were reported, and normal lower extremity motor function was noted,   the above injectate was administered so that equal amounts of the injectate were placed at each foramen (level) into the transforaminal epidural space.   Additional Comments:  The patient tolerated the procedure well Dressing: 2 x 2 sterile gauze and Band-Aid    Post-procedure details: Patient was observed during the procedure. Post-procedure instructions were reviewed.  Patient left the clinic in stable condition.

## 2020-07-01 NOTE — Progress Notes (Signed)
Hurting from shoulders to feet. Low back pain. Left slightly worse than right. Pain down anterior legs to top of feet. Sees Dr. Romelle Starcher for pain management. Gave him gabapentin for arm numbness. Numeric Pain Rating Scale and Functional Assessment Average Pain 5   In the last MONTH (on 0-10 scale) has pain interfered with the following?  1. General activity like being  able to carry out your everyday physical activities such as walking, climbing stairs, carrying groceries, or moving a chair?  Rating(9)   +Driver, -BT, -Dye Allergies.

## 2020-07-01 NOTE — Progress Notes (Signed)
Maurice Bell - 64 y.o. male MRN [id number]  Date of birth: July 08, 1956  Office Visit Note: Visit Date: 07/01/2020 PCP: Patient, No Pcp Per Referred by: No ref. provider found  Subjective: Chief Complaint  Patient presents with  . Lower Back - Pain   HPI:  Maurice Bell is a 64 y.o. male who comes in today at the request of Dr. Rodell Perna for planned Bilateral L4-L5 Lumbar epidural steroid injection with fluoroscopic guidance.  The patient has failed conservative care including home exercise, medications, time and activity modification.  This injection will be diagnostic and hopefully therapeutic.  Please see requesting physician notes for further details and justification.  MRI reviewed with images and spine model.  MRI reviewed in the note below. Sacralized L5 segment.  ROS Otherwise per HPI.  Assessment & Plan: Visit Diagnoses:  1. Lumbar radiculopathy     Plan: No additional findings.   Meds & Orders:  Meds ordered this encounter  Medications  . methylPREDNISolone acetate (DEPO-MEDROL) injection 80 mg    Orders Placed This Encounter  Procedures  . XR C-ARM NO REPORT  . Epidural Steroid injection    Follow-up: Return if symptoms worsen or fail to improve.   Procedures: No procedures performed  Lumbosacral Transforaminal Epidural Steroid Injection - Sub-Pedicular Approach with Fluoroscopic Guidance  Patient: Maurice Bell      Date of Birth: [birth year] MRN: [id number] PCP: Patient, No Pcp Per      Visit Date: 07/01/2020   Universal Protocol:    Date/Time: 07/01/2020  Consent Given By: the patient  Position: PRONE  Additional Comments: Vital signs were monitored before and after the procedure. Patient was prepped and draped in the usual sterile fashion. The correct patient, procedure, and site was verified.   Injection Procedure Details:  Procedure Site One Meds Administered:  Meds ordered this encounter  Medications  . methylPREDNISolone  acetate (DEPO-MEDROL) injection 80 mg    Laterality: Bilateral  Location/Site:  Sacralized vestigial L5 segment L4-L5  Needle size: 22 G  Needle type: Spinal  Needle Placement: Transforaminal  Findings:    -Comments: Excellent flow of contrast along the nerve, nerve root and into the epidural space.  Procedure Details: After squaring off the end-plates to get a true AP view, the C-arm was positioned so that an oblique view of the foramen as noted above was visualized. The target area is just inferior to the "nose of the scotty dog" or sub pedicular. The soft tissues overlying this structure were infiltrated with 2-3 ml. of 1% Lidocaine without Epinephrine.  The spinal needle was inserted toward the target using a "trajectory" view along the fluoroscope beam.  Under AP and lateral visualization, the needle was advanced so it did not puncture dura and was located close the 6 O'Clock position of the pedical in AP tracterory. Biplanar projections were used to confirm position. Aspiration was confirmed to be negative for CSF and/or blood. A 1-2 ml. volume of Isovue-250 was injected and flow of contrast was noted at each level. Radiographs were obtained for documentation purposes.   After attaining the desired flow of contrast documented above, a 0.5 to 1.0 ml test dose of 0.25% Marcaine was injected into each respective transforaminal space.  The patient was observed for 90 seconds post injection.  After no sensory deficits were reported, and normal lower extremity motor function was noted,   the above injectate was administered so that equal amounts of the injectate were placed at each foramen (level) into the transforaminal  epidural space.   Additional Comments:  The patient tolerated the procedure well Dressing: 2 x 2 sterile gauze and Band-Aid    Post-procedure details: Patient was observed during the procedure. Post-procedure instructions were reviewed.  Patient left the clinic in  stable condition.      Clinical History: MRI LUMBAR SPINE WITHOUT CONTRAST  TECHNIQUE: Multiplanar, multisequence MR imaging of the lumbar spine was performed. No intravenous contrast was administered.  COMPARISON:  Lumbar radiographs today, and 08/15/2018. CT Chest, Abdomen, and Pelvis 03/12/2019.  FINDINGS: Segmentation: 12 full size ribs on the May CT Chest, Abdomen, and Pelvis. Subsequently these radiographs demonstrate 4 lumbar type vertebral bodies with a sacralized L5. Correlation with radiographs is recommended prior to any operative intervention.  Alignment: Chronic straightening of lumbar lordosis. No significant spondylolisthesis.  Vertebrae: Diffusely abnormal T1 bone marrow signal with abnormal sclerosis resembling renal osteodystrophy on the comparison CT. Chronic endplate deformities resembling bulky Schmorl's nodes throughout. Chronic mild compression of the superior L1 body. Partially visible metal implant or retained foreign body at the right SI joint. Intact visible sacrum. No acute osseous abnormality identified.  Conus medullaris and cauda equina: Conus extends to the T12-L1 level. No lower spinal cord or conus signal abnormality.  Paraspinal and other soft tissues: Native renal atrophy with polycystic kidney disease. Decreased T2 signal in the visible liver and spleen. Paraspinal soft tissues are within normal limits.  Disc levels:  No lower thoracic spinal stenosis.  T12-L1:  Mild disc bulge.  No stenosis.  L1-L2:  Mild disc bulge.  No stenosis.  L2-L3: Circumferential disc bulge. Mild posterior element hypertrophy and endplate spurring. No significant spinal or lateral recess stenosis. Mild bilateral L2 foraminal stenosis.  L3-L4: Circumferential disc bulge with. Mild broad-based posterior component to moderate posterior element hypertrophy. Mild epidural lipomatosis. Mild to moderate spinal stenosis (series 8, image  25). Moderate bilateral L3 foraminal stenosis.  L4-L5: Collapse of the L4-L5 disc space since October 2019. circumferential disc osteophyte complex eccentric to the left. Mild posterior element hypertrophy. No significant spinal stenosis at this level, but there is mild to moderate left lateral recess stenosis (left L5 nerve level). There is severe bilateral L4 foraminal stenosis, probably greater on the left.  L5-S1:  Sacralized, vestigial L5-S1 disc space.  No stenosis.  IMPRESSION: 1. Transitional lumbosacral anatomy as demonstrated on the CT Chest, Abdomen, and Pelvis in May. Sacralized L5 level with vestigial L5-S1 disc. 2. 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  3. Collapse of the L4-L5 disc space since October 2019. No significant spinal stenosis at that level, but severe bilateral neural foraminal stenosis and up to moderate left lateral recess stenosis. 4. L3-L4 mild to moderate spinal and bilateral foraminal stenosis. 5. Hemosiderosis.  Polycystic renal disease.   Electronically Signed   By: Genevie Ann M.D.   On: 05/12/2019 20:20     Objective:  VS:  HT:    WT:   BMI:     BP:(!) 171/87  HR:84bpm  TEMP: ( )  RESP:  Physical Exam Constitutional:      General: He is not in acute distress.    Appearance: Normal appearance. He is not ill-appearing.  HENT:     Head: Normocephalic and atraumatic.     Right Ear: External ear normal.     Left Ear: External ear normal.  Eyes:     Extraocular Movements: Extraocular movements intact.  Cardiovascular:     Rate and Rhythm: Normal rate.  Pulses: Normal pulses.  Abdominal:     General: There is no distension.     Palpations: Abdomen is soft.  Musculoskeletal:        General: No tenderness or signs of injury.     Right lower leg: No edema.     Left lower leg: No edema.     Comments: Patient has good distal strength without clonus.  Skin:     Findings: No erythema or rash.  Neurological:     General: No focal deficit present.     Mental Status: He is alert and oriented to person, place, and time.     Sensory: No sensory deficit.     Motor: No weakness or abnormal muscle tone.     Coordination: Coordination normal.  Psychiatric:        Mood and Affect: Mood normal.        Behavior: Behavior normal.      Imaging: No results found.

## 2020-08-09 IMAGING — CT CT HEAD W/O CM
3 of 4 series · 13 of 47 positions shown, 15 images · non-contrast
Comparison: Brain MRI and CT 09/29/2018 and earlier.

CLINICAL DATA: 62-year-old male with end-stage renal disease,
dialysis. Confusion. Posterior neck pain. Abnormal cervical spine on
recent 09/29/2018 CT and MRI.

EXAM:
CT HEAD WITHOUT CONTRAST
TECHNIQUE: Contiguous axial images were obtained from the base of the skull
through the vertex without intravenous contrast.

[Series 3: head wo · axial · 0.45mm/px · z∈[-82,+52]mm · 7 of 37 slices shown, 9 images]
[im 5/37  brain]
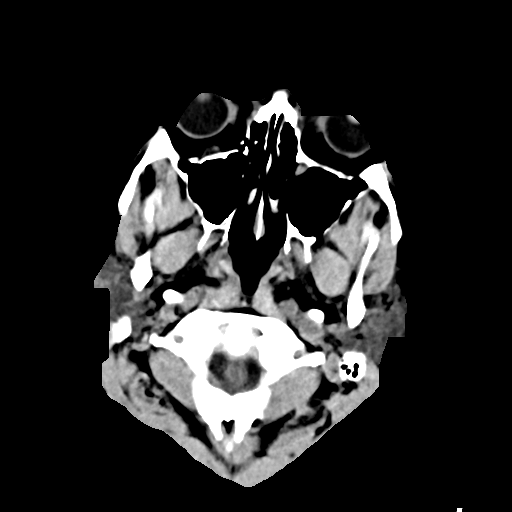
[im 5/37  bone]
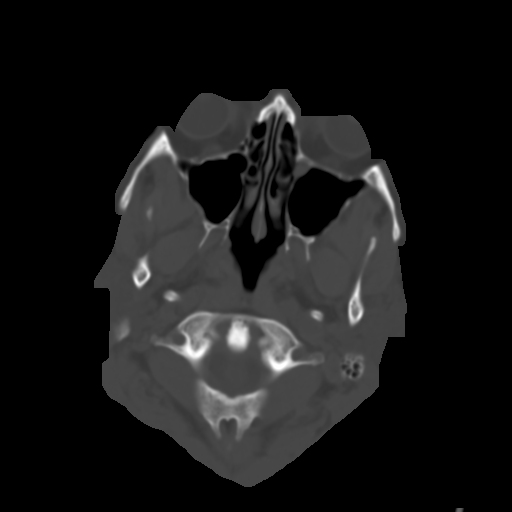
[im 10/37  brain]
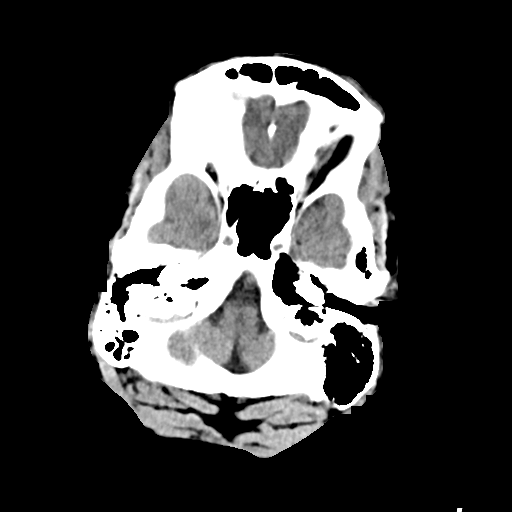
[im 14/37  brain]
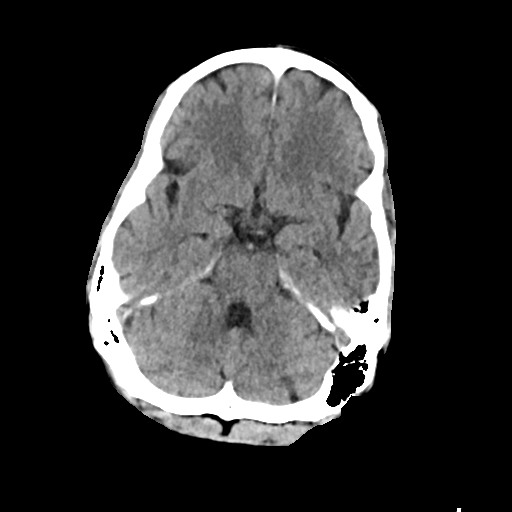
[im 19/37  brain]
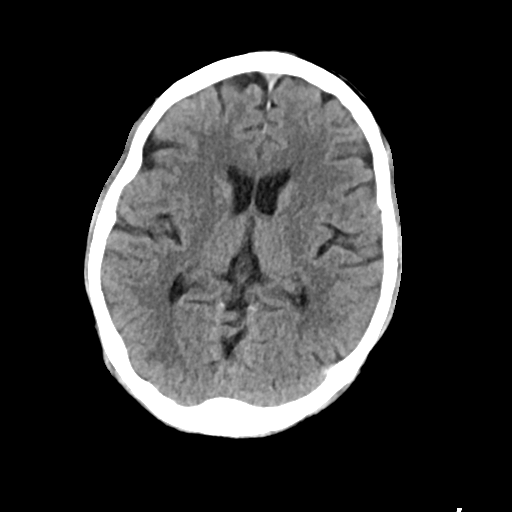
[im 23/37  brain]
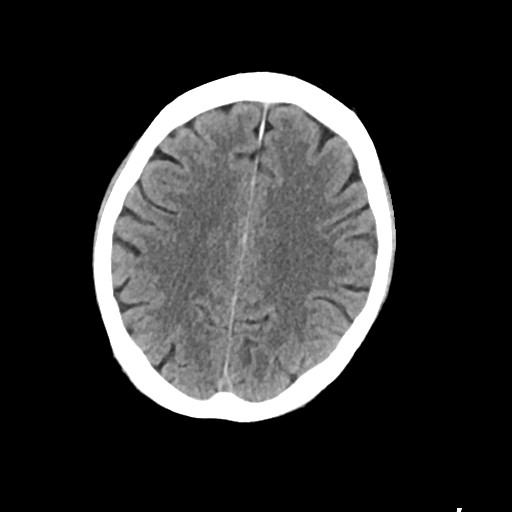
[im 23/37  bone]
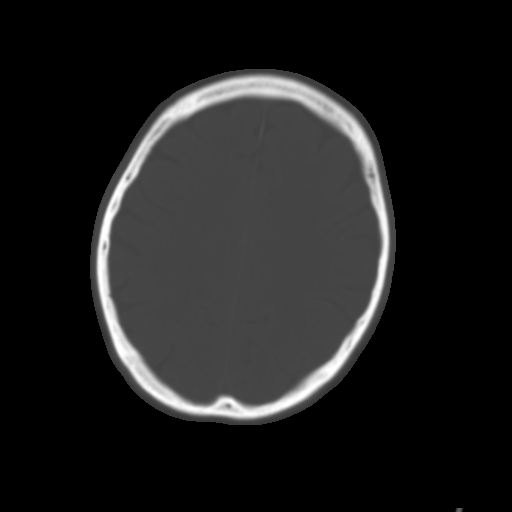
[im 28/37  brain]
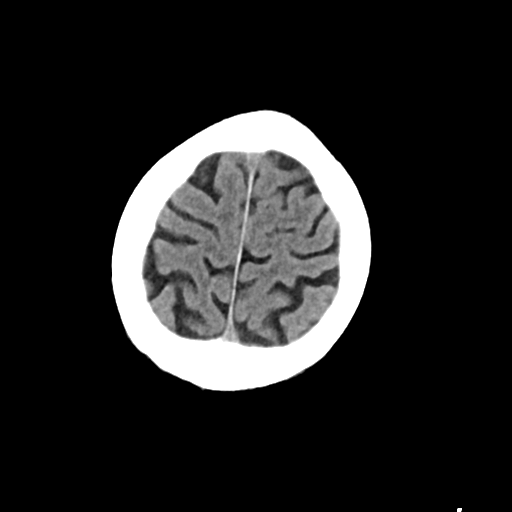
[im 32/37  brain]
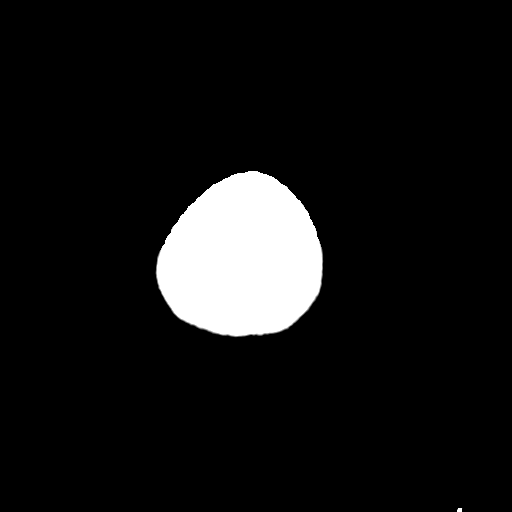

[Series 5: cor soft · coronal · 0.36mm/px · 3 of 80 slices shown]
[im 36/80  brain]
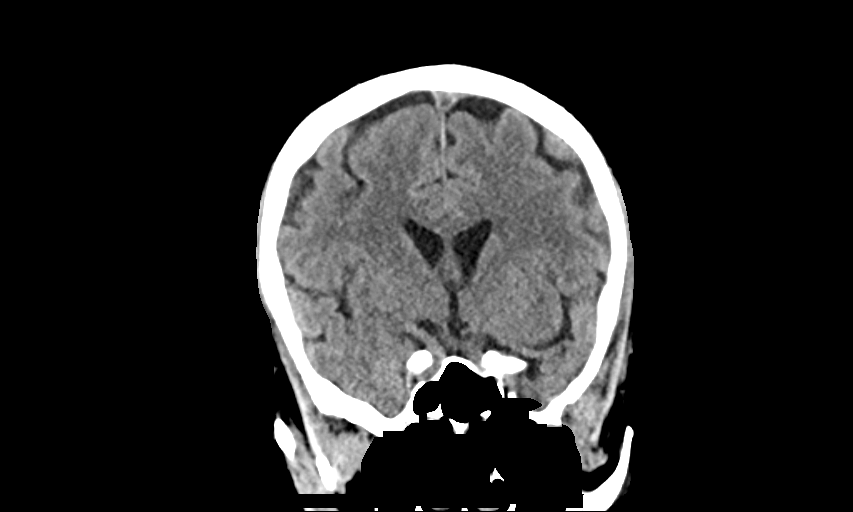
[im 44/80  brain]
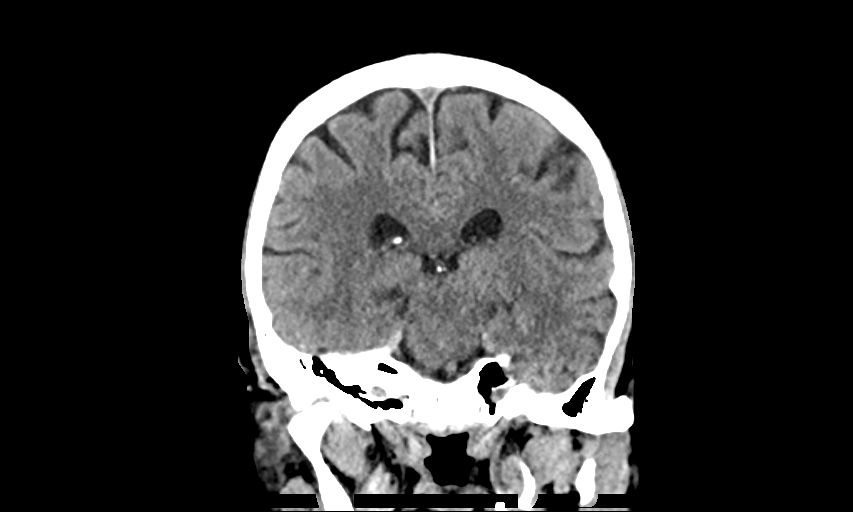
[im 53/80  brain]
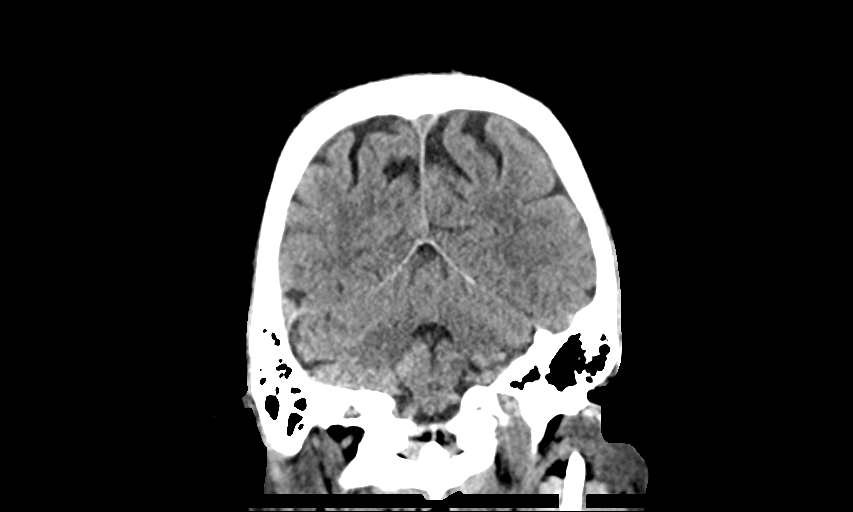

[Series 6: sag soft · sagittal · 0.36mm/px · 3 of 67 slices shown]
[im 23/67  brain]
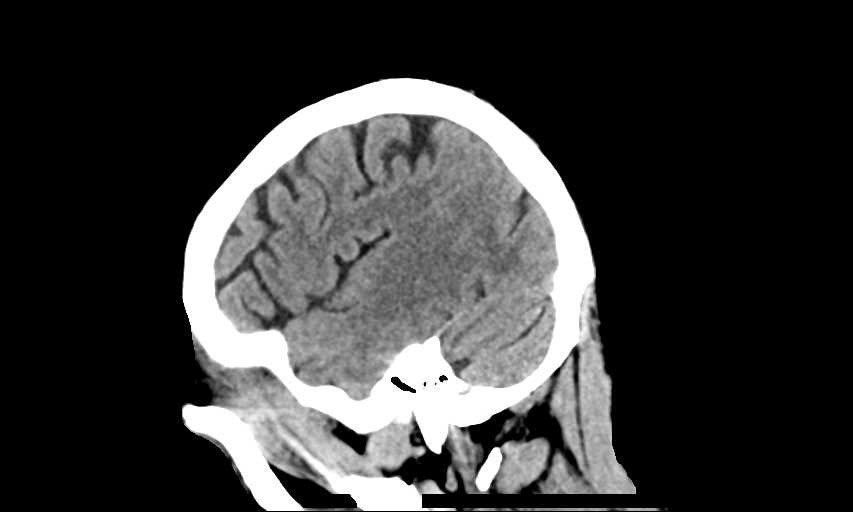
[im 34/67  brain]
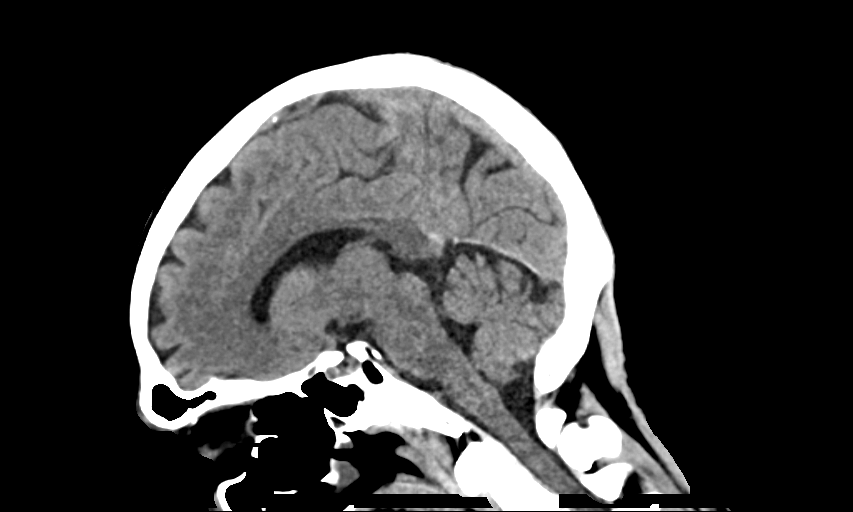
[im 45/67  brain]
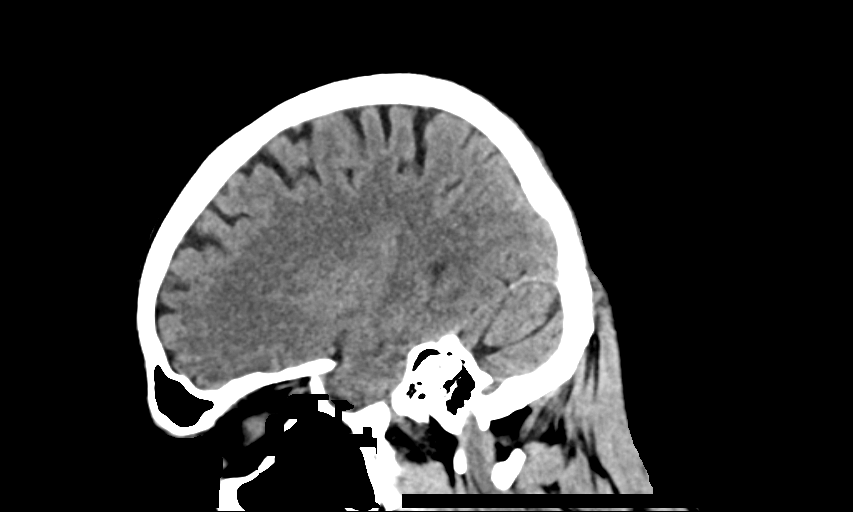

[13 of 47 positions shown; findings below may reference images not displayed]

FINDINGS: Brain: No midline shift, ventriculomegaly, mass effect, evidence of
mass lesion, intracranial hemorrhage or evidence of cortically based
acute infarction.

Stable chronic infarct in the left cerebellum. Stable gray-white
matter differentiation throughout the brain.

Vascular: No suspicious intracranial vascular hyperdensity.

Skull: Skull bone mineralization remains normal. Stable visible C1
and C2 vertebrae. No acute osseous abnormality identified.

Sinuses/Orbits: Visualized paranasal sinuses and mastoids are stable
and well pneumatized.

Other: Visualized orbits and scalp soft tissues are within normal
limits.
IMPRESSION: 1. No acute intracranial abnormality.
2. Stable chronic left cerebellar infarct.

## 2020-10-22 ENCOUNTER — Encounter (HOSPITAL_COMMUNITY): Payer: Self-pay | Admitting: Internal Medicine

## 2020-10-22 ENCOUNTER — Other Ambulatory Visit: Payer: Self-pay

## 2020-10-22 ENCOUNTER — Inpatient Hospital Stay (HOSPITAL_COMMUNITY)
Admission: EM | Admit: 2020-10-22 | Discharge: 2020-10-25 | DRG: 070 | Disposition: A | Discharge status: Home / Self Care | Payer: Medicare HMO | Attending: Internal Medicine | Admitting: Internal Medicine

## 2020-10-22 ENCOUNTER — Emergency Department (HOSPITAL_COMMUNITY): Payer: Medicare HMO

## 2020-10-22 DIAGNOSIS — K5900 Constipation, unspecified: Secondary | ICD-10-CM | POA: Diagnosis present

## 2020-10-22 DIAGNOSIS — M545 Low back pain, unspecified: Secondary | ICD-10-CM | POA: Diagnosis present

## 2020-10-22 DIAGNOSIS — Z20822 Contact with and (suspected) exposure to covid-19: Secondary | ICD-10-CM | POA: Diagnosis present

## 2020-10-22 DIAGNOSIS — B1920 Unspecified viral hepatitis C without hepatic coma: Secondary | ICD-10-CM

## 2020-10-22 DIAGNOSIS — D509 Iron deficiency anemia, unspecified: Secondary | ICD-10-CM | POA: Diagnosis present

## 2020-10-22 DIAGNOSIS — K760 Fatty (change of) liver, not elsewhere classified: Secondary | ICD-10-CM | POA: Diagnosis present

## 2020-10-22 DIAGNOSIS — G8929 Other chronic pain: Secondary | ICD-10-CM | POA: Diagnosis present

## 2020-10-22 DIAGNOSIS — D72819 Decreased white blood cell count, unspecified: Secondary | ICD-10-CM | POA: Diagnosis present

## 2020-10-22 DIAGNOSIS — I161 Hypertensive emergency: Secondary | ICD-10-CM | POA: Diagnosis present

## 2020-10-22 DIAGNOSIS — D696 Thrombocytopenia, unspecified: Secondary | ICD-10-CM | POA: Diagnosis present

## 2020-10-22 DIAGNOSIS — Z8673 Personal history of transient ischemic attack (TIA), and cerebral infarction without residual deficits: Secondary | ICD-10-CM

## 2020-10-22 DIAGNOSIS — Z87891 Personal history of nicotine dependence: Secondary | ICD-10-CM | POA: Diagnosis not present

## 2020-10-22 DIAGNOSIS — E785 Hyperlipidemia, unspecified: Secondary | ICD-10-CM | POA: Diagnosis present

## 2020-10-22 DIAGNOSIS — K7400 Hepatic fibrosis, unspecified: Secondary | ICD-10-CM

## 2020-10-22 DIAGNOSIS — R4701 Aphasia: Secondary | ICD-10-CM | POA: Diagnosis present

## 2020-10-22 DIAGNOSIS — E440 Moderate protein-calorie malnutrition: Secondary | ICD-10-CM | POA: Diagnosis present

## 2020-10-22 DIAGNOSIS — E8889 Other specified metabolic disorders: Secondary | ICD-10-CM | POA: Diagnosis present

## 2020-10-22 DIAGNOSIS — I252 Old myocardial infarction: Secondary | ICD-10-CM | POA: Diagnosis not present

## 2020-10-22 DIAGNOSIS — E875 Hyperkalemia: Secondary | ICD-10-CM | POA: Diagnosis present

## 2020-10-22 DIAGNOSIS — D72829 Elevated white blood cell count, unspecified: Secondary | ICD-10-CM | POA: Diagnosis not present

## 2020-10-22 DIAGNOSIS — K148 Other diseases of tongue: Secondary | ICD-10-CM | POA: Diagnosis present

## 2020-10-22 DIAGNOSIS — N186 End stage renal disease: Secondary | ICD-10-CM

## 2020-10-22 DIAGNOSIS — R4182 Altered mental status, unspecified: Secondary | ICD-10-CM

## 2020-10-22 DIAGNOSIS — G9349 Other encephalopathy: Secondary | ICD-10-CM | POA: Diagnosis present

## 2020-10-22 DIAGNOSIS — Z79891 Long term (current) use of opiate analgesic: Secondary | ICD-10-CM

## 2020-10-22 DIAGNOSIS — D539 Nutritional anemia, unspecified: Secondary | ICD-10-CM | POA: Diagnosis present

## 2020-10-22 DIAGNOSIS — Z992 Dependence on renal dialysis: Secondary | ICD-10-CM

## 2020-10-22 DIAGNOSIS — R140 Abdominal distension (gaseous): Secondary | ICD-10-CM

## 2020-10-22 DIAGNOSIS — I2510 Atherosclerotic heart disease of native coronary artery without angina pectoris: Secondary | ICD-10-CM | POA: Diagnosis present

## 2020-10-22 DIAGNOSIS — Z79899 Other long term (current) drug therapy: Secondary | ICD-10-CM

## 2020-10-22 DIAGNOSIS — D631 Anemia in chronic kidney disease: Secondary | ICD-10-CM | POA: Diagnosis present

## 2020-10-22 DIAGNOSIS — D7589 Other specified diseases of blood and blood-forming organs: Secondary | ICD-10-CM | POA: Diagnosis present

## 2020-10-22 DIAGNOSIS — I120 Hypertensive chronic kidney disease with stage 5 chronic kidney disease or end stage renal disease: Secondary | ICD-10-CM | POA: Diagnosis present

## 2020-10-22 DIAGNOSIS — Z8619 Personal history of other infectious and parasitic diseases: Secondary | ICD-10-CM

## 2020-10-22 DIAGNOSIS — Z681 Body mass index (BMI) 19 or less, adult: Secondary | ICD-10-CM | POA: Diagnosis not present

## 2020-10-22 DIAGNOSIS — D693 Immune thrombocytopenic purpura: Secondary | ICD-10-CM | POA: Diagnosis present

## 2020-10-22 DIAGNOSIS — Z8249 Family history of ischemic heart disease and other diseases of the circulatory system: Secondary | ICD-10-CM

## 2020-10-22 DIAGNOSIS — Z9115 Patient's noncompliance with renal dialysis: Secondary | ICD-10-CM

## 2020-10-22 DIAGNOSIS — I160 Hypertensive urgency: Secondary | ICD-10-CM

## 2020-10-22 DIAGNOSIS — Z1389 Encounter for screening for other disorder: Secondary | ICD-10-CM

## 2020-10-22 LAB — CBC WITH DIFFERENTIAL/PLATELET
Abs Immature Granulocytes: 0.01 10*3/uL (ref 0.00–0.07)
Basophils Absolute: 0 10*3/uL (ref 0.0–0.1)
Basophils Relative: 1 %
Eosinophils Absolute: 0.1 10*3/uL (ref 0.0–0.5)
Eosinophils Relative: 2 %
HCT: 40.2 % (ref 39.0–52.0)
Hemoglobin: 12.7 g/dL — ABNORMAL LOW (ref 13.0–17.0)
Immature Granulocytes: 0 %
Lymphocytes Relative: 25 %
Lymphs Abs: 0.8 10*3/uL (ref 0.7–4.0)
MCH: 35.2 pg — ABNORMAL HIGH (ref 26.0–34.0)
MCHC: 31.6 g/dL (ref 30.0–36.0)
MCV: 111.4 fL — ABNORMAL HIGH (ref 80.0–100.0)
Monocytes Absolute: 0.4 10*3/uL (ref 0.1–1.0)
Monocytes Relative: 13 %
Neutro Abs: 1.8 10*3/uL (ref 1.7–7.7)
Neutrophils Relative %: 59 %
Platelets: 59 10*3/uL — ABNORMAL LOW (ref 150–400)
RBC: 3.61 MIL/uL — ABNORMAL LOW (ref 4.22–5.81)
RDW: 14.1 % (ref 11.5–15.5)
WBC: 3 10*3/uL — ABNORMAL LOW (ref 4.0–10.5)
nRBC: 0 % (ref 0.0–0.2)

## 2020-10-22 LAB — RESP PANEL BY RT-PCR (FLU A&B, COVID) ARPGX2
Influenza A by PCR: NEGATIVE
Influenza B by PCR: NEGATIVE
SARS Coronavirus 2 by RT PCR: NEGATIVE

## 2020-10-22 LAB — COMPREHENSIVE METABOLIC PANEL
ALT: 12 U/L (ref 0–44)
AST: 24 U/L (ref 15–41)
Albumin: 3.8 g/dL (ref 3.5–5.0)
Alkaline Phosphatase: 40 U/L (ref 38–126)
Anion gap: 15 (ref 5–15)
BUN: 45 mg/dL — ABNORMAL HIGH (ref 8–23)
CO2: 28 mmol/L (ref 22–32)
Calcium: 9.4 mg/dL (ref 8.9–10.3)
Chloride: 94 mmol/L — ABNORMAL LOW (ref 98–111)
Creatinine, Ser: 11.78 mg/dL — ABNORMAL HIGH (ref 0.61–1.24)
GFR, Estimated: 4 mL/min — ABNORMAL LOW (ref >60)
Glucose, Bld: 85 mg/dL (ref 70–99)
Potassium: 5.4 mmol/L — ABNORMAL HIGH (ref 3.5–5.1)
Sodium: 137 mmol/L (ref 135–145)
Total Bilirubin: 0.5 mg/dL (ref 0.3–1.2)
Total Protein: 6.6 g/dL (ref 6.5–8.1)

## 2020-10-22 LAB — ETHANOL: Alcohol, Ethyl (B): <10 mg/dL (ref <10)

## 2020-10-22 MED ORDER — RENA-VITE PO TABS
1.0000 | ORAL_TABLET | Freq: Every day | ORAL | Status: DC
  Administered 2020-10-23 – 2020-10-24 (×2): 1 via ORAL
  Filled 2020-10-22 (×3): qty 1

## 2020-10-22 MED ORDER — NEPHRO-VITE 0.8 MG PO TABS
1.0000 | ORAL_TABLET | Freq: Every day | ORAL | Status: DC
  Filled 2020-10-22: qty 1

## 2020-10-22 MED ORDER — CLONIDINE HCL 0.1 MG PO TABS
0.1000 mg | ORAL_TABLET | Freq: Every day | ORAL | Status: DC
  Administered 2020-10-23: 11:00:00 0.1 mg via ORAL
  Filled 2020-10-22: qty 1

## 2020-10-22 MED ORDER — POLYETHYLENE GLYCOL 3350 17 G PO PACK
17.0000 g | PACK | Freq: Every day | ORAL | Status: DC
  Administered 2020-10-23 – 2020-10-24 (×2): 17 g via ORAL
  Filled 2020-10-22 (×2): qty 1

## 2020-10-22 MED ORDER — ACETAMINOPHEN 325 MG PO TABS: 650.0000 mg | ORAL_TABLET | Freq: Four times a day (QID) | ORAL | Status: DC | PRN

## 2020-10-22 MED ORDER — LORAZEPAM 1 MG PO TABS: 1.0000 mg | ORAL_TABLET | Freq: Once | ORAL | Status: DC

## 2020-10-22 MED ORDER — NICARDIPINE HCL IN NACL 20-0.86 MG/200ML-% IV SOLN: 3.0000 mg/h | INTRAVENOUS | Status: DC

## 2020-10-22 MED ORDER — CLONIDINE HCL 0.1 MG PO TABS
0.1000 mg | ORAL_TABLET | Freq: Once | ORAL | Status: AC
  Administered 2020-10-22: 22:00:00 0.1 mg via ORAL
  Filled 2020-10-22: qty 1

## 2020-10-22 MED ORDER — ACETAMINOPHEN 650 MG RE SUPP: 650.0000 mg | Freq: Four times a day (QID) | RECTAL | Status: DC | PRN

## 2020-10-22 MED ORDER — LABETALOL HCL 5 MG/ML IV SOLN
10.0000 mg | Freq: Once | INTRAVENOUS | Status: AC
  Administered 2020-10-22: 17:00:00 10 mg via INTRAVENOUS
  Filled 2020-10-22: qty 4

## 2020-10-22 MED ORDER — HYDRALAZINE HCL 20 MG/ML IJ SOLN
10.0000 mg | Freq: Once | INTRAMUSCULAR | Status: AC
  Administered 2020-10-22: 18:00:00 10 mg via INTRAVENOUS
  Filled 2020-10-22: qty 1

## 2020-10-22 MED ORDER — SEVELAMER CARBONATE 800 MG PO TABS
3200.0000 mg | ORAL_TABLET | Freq: Three times a day (TID) | ORAL | Status: DC
  Administered 2020-10-23: 09:00:00 1200 mg via ORAL
  Administered 2020-10-24 – 2020-10-25 (×4): 3200 mg via ORAL
  Filled 2020-10-22 (×8): qty 4

## 2020-10-22 MED ORDER — AMLODIPINE BESYLATE 5 MG PO TABS
10.0000 mg | ORAL_TABLET | Freq: Once | ORAL | Status: AC
  Administered 2020-10-22: 22:00:00 10 mg via ORAL
  Filled 2020-10-22: qty 2

## 2020-10-22 MED ORDER — CHLORHEXIDINE GLUCONATE CLOTH 2 % EX PADS
6.0000 | MEDICATED_PAD | Freq: Every day | CUTANEOUS | Status: DC
  Administered 2020-10-25: 05:00:00 6 via TOPICAL

## 2020-10-22 MED ORDER — NICOTINE 7 MG/24HR TD PT24
7.0000 mg | MEDICATED_PATCH | Freq: Every day | TRANSDERMAL | Status: DC
  Administered 2020-10-23 – 2020-10-25 (×3): 7 mg via TRANSDERMAL
  Filled 2020-10-22 (×3): qty 1

## 2020-10-22 MED ORDER — AMLODIPINE BESYLATE 5 MG PO TABS: 10.0000 mg | ORAL_TABLET | Freq: Once | ORAL | Status: DC

## 2020-10-22 MED ORDER — LABETALOL HCL 5 MG/ML IV SOLN
10.0000 mg | Freq: Once | INTRAVENOUS | Status: AC
  Administered 2020-10-22: 21:00:00 10 mg via INTRAVENOUS
  Filled 2020-10-22: qty 4

## 2020-10-22 MED ORDER — AMLODIPINE BESYLATE 10 MG PO TABS
10.0000 mg | ORAL_TABLET | Freq: Every day | ORAL | Status: DC
  Administered 2020-10-23 – 2020-10-25 (×3): 10 mg via ORAL
  Filled 2020-10-22: qty 1
  Filled 2020-10-22: qty 2
  Filled 2020-10-22: qty 1

## 2020-10-22 MED ORDER — LABETALOL HCL 5 MG/ML IV SOLN
10.0000 mg | INTRAVENOUS | Status: DC | PRN
  Administered 2020-10-23 – 2020-10-25 (×4): 10 mg via INTRAVENOUS
  Filled 2020-10-22 (×4): qty 4

## 2020-10-22 MED ORDER — CLONIDINE HCL 0.1 MG PO TABS: 0.1000 mg | ORAL_TABLET | Freq: Once | ORAL | Status: DC

## 2020-10-22 NOTE — ED Notes (Signed)
Patient stated he does not produce urine anymore

## 2020-10-22 NOTE — Hospital Course (Addendum)
Katherine Basset is a 64 year old male with a pertinent past medical history of ESRD (TTS), CVA who presented to [facility] with chief complaint of altered mental status.  Patient's roommate states that the patient has had a 1 to 2-day history of progressive confusion.  EMS notes the patient was alert and oriented x3 but having difficulty answering basic questions and noted to have increased stuttering.  Patient is known to have a prior CVA with unknown residual deficits.  EMS noted the patient to be hypertensive  Review of systems were positive for nause anuria    12/22 AM: Feeling "squirly" Denies current pain Does not know president. Knows person, place
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
## 2020-10-22 NOTE — ED Notes (Signed)
Patient transported to CT 

## 2020-10-22 NOTE — ED Triage Notes (Signed)
Pt bib ems from home with expressive aphasia for approx 24 hours. Unknown definitive LKW. Pt also with bil lower extremity weakness and fell from standing this morning. Did not hit head, not on blood thinners. 230/130. Pt due for dialysis today. No unilateral weakness, no facial droop. CBG 98.

## 2020-10-23 ENCOUNTER — Inpatient Hospital Stay (HOSPITAL_COMMUNITY): Payer: Medicare HMO

## 2020-10-23 DIAGNOSIS — I120 Hypertensive chronic kidney disease with stage 5 chronic kidney disease or end stage renal disease: Secondary | ICD-10-CM

## 2020-10-23 DIAGNOSIS — Z8619 Personal history of other infectious and parasitic diseases: Secondary | ICD-10-CM

## 2020-10-23 DIAGNOSIS — D72819 Decreased white blood cell count, unspecified: Secondary | ICD-10-CM

## 2020-10-23 DIAGNOSIS — D539 Nutritional anemia, unspecified: Secondary | ICD-10-CM

## 2020-10-23 DIAGNOSIS — I2510 Atherosclerotic heart disease of native coronary artery without angina pectoris: Secondary | ICD-10-CM

## 2020-10-23 DIAGNOSIS — G9349 Other encephalopathy: Principal | ICD-10-CM

## 2020-10-23 DIAGNOSIS — I252 Old myocardial infarction: Secondary | ICD-10-CM

## 2020-10-23 DIAGNOSIS — N186 End stage renal disease: Secondary | ICD-10-CM

## 2020-10-23 DIAGNOSIS — D696 Thrombocytopenia, unspecified: Secondary | ICD-10-CM

## 2020-10-23 LAB — HEPATIC FUNCTION PANEL
ALT: 11 U/L (ref 0–44)
ALT: 11 U/L (ref 0–44)
AST: 19 U/L (ref 15–41)
AST: 19 U/L (ref 15–41)
Albumin: 3.6 g/dL (ref 3.5–5.0)
Albumin: 3.5 g/dL (ref 3.5–5.0)
Alkaline Phosphatase: 40 U/L (ref 38–126)
Alkaline Phosphatase: 39 U/L (ref 38–126)
Bilirubin, Direct: <0.1 mg/dL (ref 0.0–0.2)
Bilirubin, Direct: 0.1 mg/dL (ref 0.0–0.2)
Indirect Bilirubin: 0.8 mg/dL (ref 0.3–0.9)
Total Bilirubin: 0.4 mg/dL (ref 0.3–1.2)
Total Bilirubin: 0.9 mg/dL (ref 0.3–1.2)
Total Protein: 6.8 g/dL (ref 6.5–8.1)
Total Protein: 6.1 g/dL — ABNORMAL LOW (ref 6.5–8.1)

## 2020-10-23 LAB — MAGNESIUM: Magnesium: 3 mg/dL — ABNORMAL HIGH (ref 1.7–2.4)

## 2020-10-23 LAB — CBC WITH DIFFERENTIAL/PLATELET
Abs Immature Granulocytes: 0.01 10*3/uL (ref 0.00–0.07)
Basophils Absolute: 0 10*3/uL (ref 0.0–0.1)
Basophils Relative: 1 %
Eosinophils Absolute: 0.1 10*3/uL (ref 0.0–0.5)
Eosinophils Relative: 2 %
HCT: 38.4 % — ABNORMAL LOW (ref 39.0–52.0)
Hemoglobin: 12.6 g/dL — ABNORMAL LOW (ref 13.0–17.0)
Immature Granulocytes: 0 %
Lymphocytes Relative: 38 %
Lymphs Abs: 0.9 10*3/uL (ref 0.7–4.0)
MCH: 36.1 pg — ABNORMAL HIGH (ref 26.0–34.0)
MCHC: 32.8 g/dL (ref 30.0–36.0)
MCV: 110 fL — ABNORMAL HIGH (ref 80.0–100.0)
Monocytes Absolute: 0.3 10*3/uL (ref 0.1–1.0)
Monocytes Relative: 12 %
Neutro Abs: 1 10*3/uL — ABNORMAL LOW (ref 1.7–7.7)
Neutrophils Relative %: 47 %
Platelets: 73 10*3/uL — ABNORMAL LOW (ref 150–400)
RBC: 3.49 MIL/uL — ABNORMAL LOW (ref 4.22–5.81)
RDW: 14.1 % (ref 11.5–15.5)
WBC: 2.3 10*3/uL — ABNORMAL LOW (ref 4.0–10.5)
nRBC: 0 % (ref 0.0–0.2)

## 2020-10-23 LAB — BASIC METABOLIC PANEL
Anion gap: 18 — ABNORMAL HIGH (ref 5–15)
BUN: 53 mg/dL — ABNORMAL HIGH (ref 8–23)
CO2: 25 mmol/L (ref 22–32)
Calcium: 9.2 mg/dL (ref 8.9–10.3)
Chloride: 96 mmol/L — ABNORMAL LOW (ref 98–111)
Creatinine, Ser: 13.11 mg/dL — ABNORMAL HIGH (ref 0.61–1.24)
GFR, Estimated: 4 mL/min — ABNORMAL LOW (ref >60)
Glucose, Bld: 83 mg/dL (ref 70–99)
Potassium: 5 mmol/L (ref 3.5–5.1)
Sodium: 139 mmol/L (ref 135–145)

## 2020-10-23 LAB — CBC
HCT: 41.2 % (ref 39.0–52.0)
Hemoglobin: 13.2 g/dL (ref 13.0–17.0)
MCH: 35.1 pg — ABNORMAL HIGH (ref 26.0–34.0)
MCHC: 32 g/dL (ref 30.0–36.0)
MCV: 109.6 fL — ABNORMAL HIGH (ref 80.0–100.0)
Platelets: 58 10*3/uL — ABNORMAL LOW (ref 150–400)
RBC: 3.76 MIL/uL — ABNORMAL LOW (ref 4.22–5.81)
RDW: 13.8 % (ref 11.5–15.5)
WBC: 2.5 10*3/uL — ABNORMAL LOW (ref 4.0–10.5)
nRBC: 0 % (ref 0.0–0.2)

## 2020-10-23 LAB — RETICULOCYTES
Immature Retic Fract: 3.7 % (ref 2.3–15.9)
RBC.: 3.63 MIL/uL — ABNORMAL LOW (ref 4.22–5.81)
Retic Count, Absolute: 31.2 10*3/uL (ref 19.0–186.0)
Retic Ct Pct: 0.9 % (ref 0.4–3.1)

## 2020-10-23 LAB — PROTIME-INR
INR: 1.1 (ref 0.8–1.2)
Prothrombin Time: 14 seconds (ref 11.4–15.2)

## 2020-10-23 LAB — IMMATURE PLATELET FRACTION: Immature Platelet Fraction: 6.9 % (ref 1.2–8.6)

## 2020-10-23 LAB — PHOSPHORUS: Phosphorus: 6.1 mg/dL — ABNORMAL HIGH (ref 2.5–4.6)

## 2020-10-23 LAB — RENAL FUNCTION PANEL
Albumin: 3.9 g/dL (ref 3.5–5.0)
Anion gap: 14 (ref 5–15)
BUN: 16 mg/dL (ref 8–23)
CO2: 27 mmol/L (ref 22–32)
Calcium: 9.2 mg/dL (ref 8.9–10.3)
Chloride: 97 mmol/L — ABNORMAL LOW (ref 98–111)
Creatinine, Ser: 6.07 mg/dL — ABNORMAL HIGH (ref 0.61–1.24)
GFR, Estimated: 10 mL/min — ABNORMAL LOW (ref >60)
Glucose, Bld: 97 mg/dL (ref 70–99)
Phosphorus: 3.3 mg/dL (ref 2.5–4.6)
Potassium: 3.9 mmol/L (ref 3.5–5.1)
Sodium: 138 mmol/L (ref 135–145)

## 2020-10-23 LAB — APTT: aPTT: 33 seconds (ref 24–36)

## 2020-10-23 LAB — FOLATE: Folate: 13.4 ng/mL (ref >5.9)

## 2020-10-23 LAB — TECHNOLOGIST SMEAR REVIEW

## 2020-10-23 LAB — HEPATITIS B SURFACE ANTIGEN: Hepatitis B Surface Ag: NONREACTIVE

## 2020-10-23 LAB — PATHOLOGIST SMEAR REVIEW

## 2020-10-23 LAB — VITAMIN B12: Vitamin B-12: 327 pg/mL (ref 180–914)

## 2020-10-23 LAB — SAVE SMEAR(SSMR), FOR PROVIDER SLIDE REVIEW

## 2020-10-23 LAB — AMMONIA: Ammonia: 14 umol/L (ref 9–35)

## 2020-10-23 MED ORDER — LACTULOSE 10 GM/15ML PO SOLN
10.0000 g | Freq: Three times a day (TID) | ORAL | Status: DC
  Administered 2020-10-23: 12:00:00 10 g via ORAL
  Filled 2020-10-23 (×3): qty 15

## 2020-10-23 MED ORDER — SODIUM CHLORIDE 0.9 % IV SOLN: 100.0000 mL | INTRAVENOUS | Status: DC | PRN

## 2020-10-23 MED ORDER — PENTAFLUOROPROP-TETRAFLUOROETH EX AERO
1.0000 "application " | INHALATION_SPRAY | CUTANEOUS | Status: DC | PRN
  Filled 2020-10-23: qty 116

## 2020-10-23 MED ORDER — HEPARIN SODIUM (PORCINE) 1000 UNIT/ML DIALYSIS
1000.0000 [IU] | INTRAMUSCULAR | Status: DC | PRN
  Filled 2020-10-23: qty 1

## 2020-10-23 MED ORDER — LIDOCAINE HCL (PF) 1 % IJ SOLN: 5.0000 mL | INTRAMUSCULAR | Status: DC | PRN

## 2020-10-23 MED ORDER — LIDOCAINE 5 % EX PTCH
1.0000 | MEDICATED_PATCH | Freq: Once | CUTANEOUS | Status: AC
  Administered 2020-10-23: 01:00:00 1 via TRANSDERMAL
  Filled 2020-10-23: qty 1

## 2020-10-23 MED ORDER — ALTEPLASE 2 MG IJ SOLR: 2.0000 mg | Freq: Once | INTRAMUSCULAR | Status: DC | PRN

## 2020-10-23 MED ORDER — NITROGLYCERIN 0.4 MG SL SUBL
SUBLINGUAL_TABLET | SUBLINGUAL | Status: AC
Start: 2020-10-23 — End: 2020-10-23
  Filled 2020-10-23: qty 1

## 2020-10-23 MED ORDER — LIDOCAINE-PRILOCAINE 2.5-2.5 % EX CREA
1.0000 "application " | TOPICAL_CREAM | CUTANEOUS | Status: DC | PRN
  Filled 2020-10-23: qty 5
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
## 2020-10-23 NOTE — ED Notes (Signed)
Pt to HD 7. Report to [facility]

## 2020-10-23 NOTE — ED Notes (Signed)
First contact. Change of shift. Pt resting in bed. NADN. States he is in hospital but unsure of event and time. Attempted reorientation to situation and setting.

## 2020-10-23 NOTE — ED Notes (Signed)
Report to [place], RN

## 2020-10-23 NOTE — Evaluation (Signed)
Speech Language Pathology Evaluation Patient Details Name: Maurice Bell MRN: [id number] DOB: August 24, 1956 Today's Date: 10/23/2020 Time: 5625-6389 SLP Time Calculation (min) (ACUTE ONLY): 8 min  Problem List:  Patient Active Problem List   Diagnosis Date Noted  . Hypertensive emergency 10/22/2020   Past Medical History: No past medical history on file. HPI:  64 y.o. male with hx of CVA, ESRD, HTN, STEMI in January 2021 presenting to ED with altered mental status for 3-4 days; currently with hypertensive encephalopathy, MRI negative for acute event, hyperkalemia, macrocytic anemia, thrombocytopenia, leukopenia.   Assessment / Plan / Recommendation Clinical Impression  Pt presents with broad deficits in cognition related to encephalopathy.  He is oriented to person only; could not identify elements of time/place given multiple choices.  Followed one-step commands with delay; difficulty following two-step commands without perseveration.  Response time is significantly delayed, and at times there is no response at all.  Deficits in focused and sustained attention.  Limited spontaneous verbal output, initiation.  Speech is limited to 2-3 word phrases, without dysarthria.  Unable to produce automatic sequences (counting, DOW) without loss of attention to task.  Medical work-up is still underway while pt is in ED. SLP will follow for diagnosis and to determine if f/u by our service is warranted.    SLP Assessment  SLP Recommendation/Assessment: Patient needs continued Speech Lanaguage Pathology Services SLP Visit Diagnosis: Cognitive communication deficit (R41.841)    Follow Up Recommendations  Other (comment) (tba)    Frequency and Duration min 2x/week  1 week      SLP Evaluation Cognition  Overall Cognitive Status: Impaired/Different from baseline Arousal/Alertness: Awake/alert Orientation Level: Oriented to person;Disoriented to place;Disoriented to time;Disoriented to  situation Attention: Focused Focused Attention: Impaired Focused Attention Impairment:  (unable) Awareness: Impaired Problem Solving: Impaired Safety/Judgment: Impaired       Comprehension  Auditory Comprehension Overall Auditory Comprehension: Impaired Commands: Impaired Two Step Basic Commands: 25-49% accurate Visual Recognition/Discrimination Discrimination: Not tested Reading Comprehension Reading Status: Not tested    Expression Expression Primary Mode of Expression: Verbal Verbal Expression Overall Verbal Expression: Impaired Initiation: Impaired Automatic Speech: Counting (mod cues) Level of Generative/Spontaneous Verbalization: Word Repetition: No impairment Written Expression Written Expression: Not tested   Oral / Motor  Oral Motor/Sensory Function Overall Oral Motor/Sensory Function: Mild impairment Motor Speech Overall Motor Speech: Appears within functional limits for tasks assessed   GO                    Maurice Bell 10/23/2020, 12:56 PM Maurice Bell Maurice Bell, [place] Office number [phone] Pager [phone]

## 2020-10-23 NOTE — Evaluation (Signed)
Clinical/Bedside Swallow Evaluation Patient Details  Name: Maurice Bell MRN: [id number] Date of Birth: May 12, 1956  Today's Date: 10/23/2020 Time: SLP Start Time (ACUTE ONLY): 1215 SLP Stop Time (ACUTE ONLY): 1225 SLP Time Calculation (min) (ACUTE ONLY): 10 min  Past Medical History: No past medical history on file. HPI:  64 y.o. male with hx of CVA, ESRD, HTN, STEMI in January 2021 presenting to ED with altered mental status for 3-4 days; currently with hypertensive encephalopathy, MRI negative for acute event, hyperkalemia, macrocytic anemia, thrombocytopenia, leukopenia.   Assessment / Plan / Recommendation Clinical Impression  Pt participated in clinical swallow assessment - he was alert, responded to commands with delay and inconsistency. Oral mechanism exam was unremarkable.  There was slight deviation of tongue to left on extension.  Pt demonstrated functional acceptance of ice chips, purees, and water with adequate recognition, oral manipulation, palpable swallow, and no s/s of aspiration.  Voice remained clear; no spontaneous coughing. He presented with abrupt cessation in motor patterns during mastication, followed by contortions of facial muscles, then resumption of eating/swallowing without concern for aspiration. When asked about his swallowing, his response was consistently "I don't know." Recommend downgrading diet to dysphagia 1 (pureed), thin liquids for now given his changes in mental status (attention and motor output)> SLP will follow for safety/diet progression/education. D/W RN. SLP Visit Diagnosis: Dysphagia, unspecified (R13.10)    Aspiration Risk       Diet Recommendation   dysphagia 1, thin liquids  Medication Administration: Whole meds with liquid    Other  Recommendations     Follow up Recommendations Other (comment) (tba)      Frequency and Duration min 2x/week  1 week       Prognosis Prognosis for Safe Diet Advancement: Good      Swallow Study    General HPI: 64 y.o. male with hx of CVA, ESRD, HTN, STEMI in January 2021 presenting to ED with altered mental status for 3-4 days; currently with hypertensive encephalopathy, MRI negative for acute event, hyperkalemia, macrocytic anemia, thrombocytopenia, leukopenia. Type of Study: Bedside Swallow Evaluation Previous Swallow Assessment: no Diet Prior to this Study: Regular;Thin liquids Temperature Spikes Noted: No Respiratory Status: Room air History of Recent Intubation: No Behavior/Cognition: Alert;Confused Oral Cavity Assessment: Within Functional Limits Oral Care Completed by SLP: No Oral Cavity - Dentition: Missing dentition Self-Feeding Abilities: Needs assist Patient Positioning: Upright in bed Baseline Vocal Quality: Normal Volitional Cough: Strong Volitional Swallow: Able to elicit    Oral/Motor/Sensory Function Overall Oral Motor/Sensory Function: Mild impairment (slight deviation of tongue to left upon extension)   Ice Chips Ice chips: Within functional limits   Thin Liquid Thin Liquid: Within functional limits    Nectar Thick Nectar Thick Liquid: Not tested   Honey Thick Honey Thick Liquid: Not tested   Puree Puree: Within functional limits   Solid     Solid: Not tested      [name] 10/23/2020,12:43 PM  Estill Bamberg L. Tivis Ringer, [place] Office number [phone] Pager [phone]

## 2020-10-23 NOTE — ED Notes (Signed)
Lunch Tray Ordered @ 1102. 
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
## 2020-10-24 ENCOUNTER — Encounter (HOSPITAL_COMMUNITY): Payer: Self-pay | Admitting: Internal Medicine

## 2020-10-24 MED ORDER — HYDRALAZINE HCL 50 MG PO TABS: 50.0000 mg | ORAL_TABLET | Freq: Three times a day (TID) | ORAL | Status: DC

## 2020-10-24 MED ORDER — OXYCODONE-ACETAMINOPHEN 5-325 MG PO TABS
1.0000 | ORAL_TABLET | Freq: Two times a day (BID) | ORAL | Status: DC | PRN
  Administered 2020-10-24 – 2020-10-25 (×3): 1 via ORAL
  Filled 2020-10-24 (×5): qty 1

## 2020-10-24 MED ORDER — METOPROLOL TARTRATE 50 MG PO TABS
150.0000 mg | ORAL_TABLET | Freq: Two times a day (BID) | ORAL | Status: DC
  Administered 2020-10-24 – 2020-10-25 (×2): 150 mg via ORAL
  Filled 2020-10-24 (×3): qty 1

## 2020-10-24 MED ORDER — METOPROLOL TARTRATE 50 MG PO TABS: 150.0000 mg | ORAL_TABLET | Freq: Two times a day (BID) | ORAL | Status: DC

## 2020-10-24 MED ORDER — NEPRO/CARBSTEADY PO LIQD: 237.0000 mL | Freq: Two times a day (BID) | ORAL | Status: DC

## 2020-10-24 MED ORDER — SENNOSIDES-DOCUSATE SODIUM 8.6-50 MG PO TABS
1.0000 | ORAL_TABLET | Freq: Two times a day (BID) | ORAL | Status: AC
  Administered 2020-10-24 (×2): 1 via ORAL
  Filled 2020-10-24 (×2): qty 1

## 2020-10-24 MED ORDER — HYDRALAZINE HCL 50 MG PO TABS
50.0000 mg | ORAL_TABLET | Freq: Three times a day (TID) | ORAL | Status: DC
  Administered 2020-10-24 – 2020-10-25 (×2): 50 mg via ORAL
  Filled 2020-10-24 (×2): qty 1

## 2020-10-24 NOTE — Progress Notes (Signed)
HD#2 Subjective:   No acute events overnight. Per RN, patient manually disimpacted himself overnight.  During evaluation at bedside during rounds, patient reports feeling a lot better today in that he has more energy and is less confused. When asked about his manual disimpaction, he reports he the stool he removed was very hard, states he takes Miralax at home. States he is hungry. Also reports he takes Percocet three times daily as needed for chronic low back pain, follows with [facility] for pain management. Also reports chronic numbness in the 4th and 5th digits of his left hand.   Objective:  Vital signs in last 24 hours: Vitals:   10/23/20 1844 10/23/20 2102 10/24/20 0018 10/24/20 0500  BP: (!) 152/88 127/69 (!) 163/91 (!) 179/87  Pulse: 77 88 83 84  Resp: 20 18 18 18   Temp: 97.8 F (36.6 C) 97.8 F (36.6 C) 98 F (36.7 C) 98.9 F (37.2 C)  TempSrc: Oral Oral Oral Oral  SpO2: 99% 99% 99% 100%  Weight:    59.9 kg  Height:       Constitutional: well-appearing man sitting up in bed, in no acute distress HENT: normocephalic atraumatic, mucous membranes moist Cardiovascular: regular rate and rhythm, no m/r/g Pulmonary/Chest: normal work of breathing on room air, lungs clear to auscultation bilaterally Abdominal: soft, non-tender, non-distended Neurological: alert & oriented to self, location, time, and situation. Slight myoclonus of head, however patient able to overcome. Minimal myoclonus with extension of bilateral wrists. Skin: warm and dry  Pertinent Labs: CBC Latest Ref Rng & Units 10/23/2020 10/23/2020 10/22/2020  WBC 4.0 - 10.5 K/uL 2.5(L) 2.3(L) 3.0(L)  Hemoglobin 13.0 - 17.0 g/dL 13.2 12.6(L) 12.7(L)  Hematocrit 39.0 - 52.0 % 41.2 38.4(L) 40.2  Platelets 150 - 400 K/uL 58(L) 73(L) 59(L)    CMP Latest Ref Rng & Units 10/23/2020 10/23/2020 10/23/2020  Glucose 70 - 99 mg/dL 97 - -  BUN 8 - 23 mg/dL 16 - -  Creatinine 0.61 - 1.24 mg/dL 6.07(H) - -  Sodium  135 - 145 mmol/L 138 - -  Potassium 3.5 - 5.1 mmol/L 3.9 - -  Chloride 98 - 111 mmol/L 97(L) - -  CO2 22 - 32 mmol/L 27 - -  Calcium 8.9 - 10.3 mg/dL 9.2 - -  Total Protein 6.5 - 8.1 g/dL - 6.1(L) 6.8  Total Bilirubin 0.3 - 1.2 mg/dL - 0.9 0.4  Alkaline Phos 38 - 126 U/L - 39 40  AST 15 - 41 U/L - 19 19  ALT 0 - 44 U/L - 11 11    Imaging: US Abdomen Complete  Result Date: 10/23/2020 CLINICAL DATA:  AMS, distended abdomen, HCV and hepatic fibrosis. EXAM: ABDOMEN ULTRASOUND COMPLETE COMPARISON:  10/22/2020, 03/12/2019. FINDINGS: Gallbladder: No gallstones or wall thickening visualized. Intraluminal polyp measuring up to 4.2 mm. No sonographic Murphy sign noted by sonographer. Common bile duct: Diameter: 4.7 mm Liver: No focal lesion identified. Increased parenchymal echogenicity. Portal vein is patent on color Doppler imaging with normal direction of blood flow towards the liver. IVC: Not well visualized. Pancreas: Visualized portion unremarkable. Spleen: Size and appearance within normal limits. Right Kidney: Length: 9.8 cm. Increased parenchymal echogenicity. No hydronephrosis visualized. Cysts measuring up to 3.6 cm. Left Kidney: Length: 10.0 cm. Increased parenchymal echogenicity. No hydronephrosis visualized. Cysts measuring up to 4.6 cm. Abdominal aorta: No aneurysm visualized. Other findings: Overlying bowel gas and patient disposition limits evaluation. IMPRESSION: Bilateral renal cysts. Echogenic renal parenchyma may reflect sequela of medical  renal disease. Hepatic steatosis.  No focal hepatic lesion. Incidental gallbladder polyps measuring up to 4 mm. Electronically Signed   By: Primitivo Gauze M.D.   On: 10/23/2020 12:18    Assessment/Plan:   Active Problems:   Hypertensive emergency   Patient Summary: Maurice Bell is a 64 y.o. man with hx of CVA, ESRD on TTS HD, HTN, STEMI in January 2021 who presented with altered mental status suspected secondary to hypertensive  emergency vs uremic encephalopathy. His mentation has improved significantly after yesterday's hemodialysis.  Today is hospital day 2.  Acute encephalopathy Uremic encephalopathy ESRD (on TTS HD) HTN Patient with significantly improved mentation, resolution of expressive aphasia, and improved myoclonus this morning after going for hemodialysis yesterday. BUN significantly improved. BP still elevated after session. Per nephrology, amlodipine has been resumed with plan for resumption of hydralazine and metoprolol today. Patient will likely require a longer run time as an outpatient for adequate fluid removal for BP control. Given his rapid improvement of encephalopathy after this dialysis session despite still elevated BP, suspect the etiology for the patient's AMS on admission mostly secondary to uremic encephalopathy due to a missed HD session and history of coming of HD sessions early.  - Appreciate nephrology's assistance - BP control per nephrology - Hemodialysis today to resume TTS schedule - Continue to avoid nephrotoxic medications - Will resume diet  Chronic low back pain Patient reports he follows with [facility] for pain management, takes Percocet 5-325 mg three times daily as needed for chronic low back pain.  - Tylenol 650 mg q6h PrN for mild pain - Start Percocet 5-325 mg q12h PRN for severe pain - If patient tolerates, can resume home frequency  Constipation Patient manually disimpacted himself overnight. Reports he takes Miralax at home. Denies abdominal pain. - Miralax nightly - Start Senokot-S  Macrocytic anemia Thrombocytopenia Leukopenia Stable.  - Further work-up as an outpatient  HCV  Hx of HBV infection Patient has history of HCV but unknown if he has been treated for this. He will need follow-up with infectious disease for further evaluation management  CAD Hx of MI Patient has a history of MI on aspirin and statin. -Aspirin 81 mg and moderate  intensity statin  Diet: Renal diet IVF: None VTE: SCDs Start: 10/22/20 2021 Code: Full PT/OT: Recs Pending    Alexandria Lodge, MD Internal Medicine Resident, PGY-1 [facility]  Pager: [phone] 6:57 AM, 10/24/2020   Please contact the on call pager after 5 pm and on weekends at [phone].

## 2020-10-24 NOTE — Progress Notes (Signed)
PT has made three attempts to see this patient.  Will continue on with him to do eval as time allows.   10/24/20 1600  PT Visit Information  Reason Eval/Treat Not Completed Other (comment)   Mee Hives, PT MS Acute Rehab Dept. Number: [place]
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
## 2020-10-25 DIAGNOSIS — Z681 Body mass index (BMI) 19 or less, adult: Secondary | ICD-10-CM

## 2020-10-25 DIAGNOSIS — K5900 Constipation, unspecified: Secondary | ICD-10-CM

## 2020-10-25 DIAGNOSIS — D72829 Elevated white blood cell count, unspecified: Secondary | ICD-10-CM

## 2020-10-25 DIAGNOSIS — G8929 Other chronic pain: Secondary | ICD-10-CM

## 2020-10-25 DIAGNOSIS — I161 Hypertensive emergency: Secondary | ICD-10-CM

## 2020-10-25 DIAGNOSIS — E440 Moderate protein-calorie malnutrition: Secondary | ICD-10-CM

## 2020-10-25 DIAGNOSIS — Z992 Dependence on renal dialysis: Secondary | ICD-10-CM

## 2020-10-25 DIAGNOSIS — M545 Low back pain, unspecified: Secondary | ICD-10-CM

## 2020-10-25 MED ORDER — SENNA 8.6 MG PO TABS: 1.0000 | ORAL_TABLET | Freq: Every day | ORAL | 0 refills | Status: DC | PRN

## 2020-10-25 NOTE — Evaluation (Signed)
Occupational Therapy Evaluation and Discharge Patient Details Name: Maurice Bell MRN: [id number] DOB: 08-06-1956 Today's Date: 10/25/2020    History of Present Illness 64 year old [name] is living with ESRD and has resistant hypertension, which we learned has been associated with high blood pressure readings both before and after dialysis sessions.  Of concern at this presentation was a change in mental status which in this clinical setting was very concerning for hypertensive urgency/emergency causing cognitive change (MRI of brain showed no acute findings).PHMX:hx of CVA, ESRD, HTN, STEMI in January 2021   Clinical Impression   This 64 yo male admitted with above presents to acute OT at an independent level in his hospital room. Do not feel he will have issues at home either now that his AMS has cleared. We will D/C from acute OT.    Follow Up Recommendations  No OT follow up;Supervision - Intermittent    Equipment Recommendations  None recommended by OT       Precautions / Restrictions Precautions Precautions: None Restrictions Weight Bearing Restrictions: No      Mobility Bed Mobility Overal bed mobility: Independent                  Transfers Overall transfer level: Independent                    Balance Overall balance assessment: Mild deficits observed, not formally tested                                         ADL either performed or assessed with clinical judgement   ADL Overall ADL's : Independent                                             Vision Patient Visual Report: No change from baseline              Pertinent Vitals/Pain Pain Assessment: No/denies pain Faces Pain Scale: No hurt     Hand Dominance Right   Extremity/Trunk Assessment Upper Extremity Assessment Upper Extremity Assessment: Overall WFL for tasks assessed           Communication Communication Communication: No  difficulties   Cognition Arousal/Alertness: Awake/alert Behavior During Therapy: WFL for tasks assessed/performed Overall Cognitive Status: Within Functional Limits for tasks assessed                                                Home Living Family/patient expects to be discharged to:: Private residence Living Arrangements: Children Available Help at Discharge: Family;Available PRN/intermittently               Bathroom Shower/Tub: Tub/shower unit   Bathroom Toilet: Standard     Home Equipment: Environmental consultant - 2 wheels;Cane - single point          Prior Functioning/Environment Level of Independence: Independent        Comments: Drives himself to diaysis                 OT Goals(Current goals can be found in the care plan section) Acute Rehab OT Goals Patient Stated Goal: to go home today  OT Frequency:                AM-PAC OT "6 Clicks" Daily Activity     Outcome Measure Help from another person eating meals?: None Help from another person taking care of personal grooming?: None Help from another person toileting, which includes using toliet, bedpan, or urinal?: None Help from another person bathing (including washing, rinsing, drying)?: None Help from another person to put on and taking off regular upper body clothing?: None Help from another person to put on and taking off regular lower body clothing?: None 6 Click Score: 24   End of Session    Activity Tolerance: Patient tolerated treatment well Patient left: in bed;with call bell/phone within reach;with bed alarm set  OT Visit Diagnosis: Muscle weakness (generalized) (M62.81)                Time: 3552-1747 OT Time Calculation (min): 12 min Charges:  OT General Charges $OT Visit: 1 Visit OT Evaluation $OT Eval Low Complexity: 1 Low  [place], OTR/L Acute [employer] Pager [phone] Office [phone]     Almon Register 10/25/2020, 11:54 AM

## 2020-10-25 NOTE — TOC Initial Note (Signed)
Transition of Care [facility]) - Initial/Assessment Note    Patient Details  Name: Maurice Bell MRN: [id number] Date of Birth: [birth year]  Transition of Care [facility]) CM/SW Contact:    Bethena Roys, RN Phone Number: 10/25/2020, 2:13 PM  Clinical Narrative: Patient presented for hypertension emergency. Prior to arrival patient was from home with support of his adult son. Patient has durable medical equipment cane and rolling walker in the home. Per patient he has a PCP at [facility]. Case Manager offered choice for home health services and patient chose [facility]. Referral made and start of care to begin within 24-48 hours post transition home. Patient declined rolling walker since has one in the home. Son will transport the patient home via private vehicle.                  Expected Discharge Plan: [place] Barriers to Discharge: No Barriers Identified   Patient Goals and CMS Choice Patient states their goals for this hospitalization and ongoing recovery are:: to return home   Choice offered to / list presented to : Patient  Expected Discharge Plan and Services Expected Discharge Plan: [place] In-house Referral: NA Discharge Planning Services: CM Consult Post Acute Care Choice: [place] arrangements for the past 2 months: Single Family Home Expected Discharge Date: 10/25/20               DME Arranged: N/A DME Agency: NA       HH Arranged: PT HH Agency: [place] Date [facility] Agency Contacted: 10/25/20 Time HH Agency Contacted: 37 Representative spoke with at [place]: Tommi Rumps  Prior Living Arrangements/Services Living arrangements for the past 2 months: [place] with:: Adult Children Patient language and need for interpreter reviewed:: Yes Do you feel safe going back to the place where you live?: Yes      Need for Family Participation in Patient Care: Yes  (Comment) Care giver support system in place?: Yes (comment) Current home services: DME (Pt has a rolling walker and cane in the home.) Criminal Activity/Legal Involvement Pertinent to Current Situation/Hospitalization: No - Comment as needed  Activities of Daily Living Home Assistive Devices/Equipment: None ADL Screening (condition at time of admission) Patient's cognitive ability adequate to safely complete daily activities?: No Is the patient deaf or have difficulty hearing?: No Does the patient have difficulty seeing, even when wearing glasses/contacts?: No Does the patient have difficulty concentrating, remembering, or making decisions?: Yes Patient able to express need for assistance with ADLs?: Yes Does the patient have difficulty dressing or bathing?: No Independently performs ADLs?: Yes (appropriate for developmental age) Does the patient have difficulty walking or climbing stairs?: Yes Weakness of Legs: Both Weakness of Arms/Hands: Both  Permission Sought/Granted Permission sought to share information with : Case Manager,Family Chief Financial Officer Permission granted to share information with : Yes, Verbal Permission Granted     Permission granted to share info w AGENCY: [facility]        Emotional Assessment Appearance:: Appears stated age Attitude/Demeanor/Rapport: Engaged Affect (typically observed): Appropriate Orientation: : Oriented to  Time,Oriented to Self,Oriented to Situation,Oriented to Place Alcohol / Substance Use: Not Applicable Psych Involvement: No (comment)  Admission diagnosis:  Hepatic fibrosis [K74.00] Altered mental status [R41.82] Distended abdomen [R14.0] ESRD (end stage renal disease) ([place]) [N18.6] HCV (hepatitis C virus) [B19.20] Encounter for imaging to screen for metal prior to MRI [Z13.89] Hypertensive urgency [I16.0] Hypertensive emergency [I16.1]  Patient Active Problem List   Diagnosis Date Noted  . Malnutrition of  moderate degree 10/25/2020  . Hypertensive emergency 10/22/2020   PCP:  Pcp, No Pharmacy:   [id number] - [place], [place] [address] [place] [address] Phone: [phone] Fax: [phone]   Readmission Risk Interventions No flowsheet data found.

## 2020-10-25 NOTE — Discharge Instructions (Addendum)
Mr. Wert,   It was a pleasure taking care of you in the hospital. You were admitted for confusion which improved significantly after hemodialysis. You were evaluated by the kidney doctors who have recommended that your run time in dialysis be increased. You also had elevated blood pressures.   The only change made to your home medications is that we stopped clonidine 0.1 mg three times daily. Continue your amlodipine 10 mg daily, hydralazine 50 mg three times daily, and metoprolol 150 mg twice daily. Continue your other medications as directed.  Recommend you follow-up with your primary care doctor in the next week in addition to maintaining your dialysis. Your next HD is on 10/27/2020 on Holiday schedule  Take care!

## 2020-10-25 NOTE — Progress Notes (Signed)
Initial Nutrition Assessment  DOCUMENTATION CODES:   Non-severe (moderate) malnutrition in context of chronic illness  INTERVENTION:    Nepro Shake po BID, each supplement provides 425 kcal and 19 grams protein  Increased protein and calorie diet education provided.   NUTRITION DIAGNOSIS:   Moderate Malnutrition related to chronic illness (ESRD on HD) as evidenced by mild fat depletion,moderate fat depletion,mild muscle depletion,moderate muscle depletion,severe muscle depletion.  GOAL:   Patient will meet greater than or equal to 90% of their needs  MONITOR:   PO intake,Supplement acceptance  REASON FOR ASSESSMENT:   Malnutrition Screening Tool    ASSESSMENT:   64 yo male admitted with AMS d/t uremic encephalopathy. PMH includes CVA, ESRD on HD TTS, HTN, STEMI.   Patient reports poor intake at home for the past week or so. He has had a poor appetite due to uremia. He thinks he has lost ~15 lbs in the past week. He states that he is feeling much better today and ate 100% of his breakfast. He agreed to drink Nepro Shakes between meals to maximize protein and kcal intake. Discussed with patient increased nutrition needs for HD. He states he knows that he needs a high protein, low sodium diet. He is hopeful for d/c home today. RD provided a Nepro Shake to patient and he drank it during visit. He likes the supplements, but states they are too expensive and insurance will not cover them. Discussed with patient ways to increase protein and calorie intake, including eating 3 meals and 3 snacks per day.   Labs reviewed.  Medications reviewed and include Rena-vit, Renvela, Miralax.   Weight loss likely related to volume status.  HD run time has been increased to 4 hours to optimize clearance and volume unloading.   Patient meets criteria for moderate malnutrition with mild-moderate depletion of muscle and subcutaneous fat mass.   NUTRITION - FOCUSED PHYSICAL EXAM:  Flowsheet Row  Most Recent Value  Orbital Region Mild depletion  Upper Arm Region Moderate depletion  Thoracic and Lumbar Region Moderate depletion  Buccal Region Mild depletion  Temple Region Mild depletion  Clavicle Bone Region Severe depletion  Clavicle and Acromion Bone Region Moderate depletion  Scapular Bone Region Moderate depletion  Dorsal Hand Mild depletion  Patellar Region Moderate depletion  Anterior Thigh Region Moderate depletion  Posterior Calf Region Moderate depletion  Edema (RD Assessment) None  Hair Reviewed  Eyes Reviewed  Mouth Reviewed  Skin Reviewed  Nails Reviewed       Diet Order:   Diet Order            Diet renal with fluid restriction Fluid restriction: 1200 mL Fluid; Room service appropriate? Yes; Fluid consistency: Thin  Diet effective now                 EDUCATION NEEDS:   Education needs have been addressed (discussed with patient the importance of adequate protein and calorie intake)  Skin:  Skin Assessment: Reviewed RN Assessment  Last BM:  12/23  Height:   Ht Readings from Last 1 Encounters:  10/22/20 5\' 5"  (1.651 m)    Weight:   Wt Readings from Last 1 Encounters:  10/25/20 52.3 kg    Ideal Body Weight:  61.8 kg  BMI:  Body mass index is 19.17 kg/m.  Estimated Nutritional Needs:   Kcal:  1850-2150  Protein:  80-90 gm  Fluid:  1 L + UOP    Lucas Mallow, RD, LDN, CNSC Please refer to [facility]  for contact information.
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
## 2020-10-25 NOTE — Evaluation (Signed)
Physical Therapy Evaluation Patient Details Name: Maurice Bell MRN: [id number] DOB: 04-15-1956 Today's Date: 10/25/2020   History of Present Illness  64 year old [name] is living with ESRD and has resistant hypertension, which we learned has been associated with high blood pressure readings both before and after dialysis sessions.  Of concern at this presentation was a change in mental status which in this clinical setting was very concerning for hypertensive urgency/emergency causing cognitive change (MRI of brain showed no acute findings).PHMX:hx of CVA, ESRD, HTN, STEMI in January 2021  Clinical Impression  Pt was seen for assessment of mobiltiy on RW as he is reaching for support without AD.  He is steadier after walking, talks about the length of time he's been off his feet.  Pt will benefit from a short course of HHPT to recover the stability that allows him to walk with no AD.  Follow acutely as his stay permits to recover strength, balance and endurance to stabilize gait and transfers.    Follow Up Recommendations Home health PT;Supervision for mobility/OOB    Equipment Recommendations  Rolling walker with 5" wheels    Recommendations for Other Services       Precautions / Restrictions Precautions Precautions: None Precaution Comments: safer with RW Restrictions Weight Bearing Restrictions: No      Mobility  Bed Mobility Overal bed mobility: Modified Independent                  Transfers Overall transfer level: Modified independent                  Ambulation/Gait Ambulation/Gait assistance: Supervision (for safety) Gait Distance (Feet): 200 Feet Assistive device: Rolling walker (2 wheeled) Gait Pattern/deviations: Step-through pattern;Narrow base of support;Decreased stride length Gait velocity: reduced Gait velocity interpretation: <1.31 ft/sec, indicative of household ambulator General Gait Details: used RW as pt tends to reach for  counter otherwise  Financial trader Rankin (Stroke Patients Only)       Balance Overall balance assessment: Needs assistance Sitting-balance support: Feet supported Sitting balance-Leahy Scale: Good     Standing balance support: Bilateral upper extremity supported;During functional activity Standing balance-Leahy Scale: Fair                               Pertinent Vitals/Pain Pain Assessment: No/denies pain Faces Pain Scale: No hurt    Home Living Family/patient expects to be discharged to:: Private residence Living Arrangements: Children Available Help at Discharge: Family;Available PRN/intermittently Type of Home: House Home Access: Level entry     Home Layout: One level Home Equipment: Walker - 2 wheels;Cane - single point Additional Comments: has all walking equipment needed    Prior Function Level of Independence: Independent         Comments: drives to store, to HD     Hand Dominance   Dominant Hand: Right    Extremity/Trunk Assessment   Upper Extremity Assessment Upper Extremity Assessment: Defer to OT evaluation    Lower Extremity Assessment Lower Extremity Assessment: Generalized weakness    Cervical / Trunk Assessment Cervical / Trunk Assessment: Normal  Communication   Communication: No difficulties  Cognition Arousal/Alertness: Awake/alert Behavior During Therapy: WFL for tasks assessed/performed Overall Cognitive Status: Within Functional Limits for tasks assessed  General Comments General comments (skin integrity, edema, etc.): tends to get up to walk with sudden stand but is better with controlled start and then transfer to walker for now.    Exercises     Assessment/Plan    PT Assessment Patient needs continued PT services  PT Problem List Decreased strength;Decreased balance;Decreased knowledge of use of  DME;Cardiopulmonary status limiting activity       PT Treatment Interventions DME instruction;Gait training;Functional mobility training;Therapeutic activities;Therapeutic exercise;Balance training;Neuromuscular re-education;Patient/family education    PT Goals (Current goals can be found in the Care Plan section)  Acute Rehab PT Goals Patient Stated Goal: to go home today PT Goal Formulation: With patient Time For Goal Achievement: 11/01/20 Potential to Achieve Goals: Good    Frequency Min 3X/week   Barriers to discharge Decreased caregiver support has caregiver help he can arrange    Co-evaluation               AM-PAC PT "6 Clicks" Mobility  Outcome Measure Help needed turning from your back to your side while in a flat bed without using bedrails?: None Help needed moving from lying on your back to sitting on the side of a flat bed without using bedrails?: A Little Help needed moving to and from a bed to a chair (including a wheelchair)?: A Little Help needed standing up from a chair using your arms (e.g., wheelchair or bedside chair)?: A Little Help needed to walk in hospital room?: A Little Help needed climbing 3-5 steps with a railing? : A Little 6 Click Score: 19    End of Session Equipment Utilized During Treatment: Gait belt Activity Tolerance: Patient tolerated treatment well Patient left: in chair;with call bell/phone within reach;with chair alarm set Nurse Communication: Mobility status PT Visit Diagnosis: Muscle weakness (generalized) (M62.81);Difficulty in walking, not elsewhere classified (R26.2)    Time: 0454-0981 PT Time Calculation (min) (ACUTE ONLY): 16 min   Charges:   PT Evaluation $PT Eval Moderate Complexity: 1 Mod         Ramond Dial 10/25/2020, 12:34 PM  Mee Hives, PT MS Acute Rehab Dept. Number: [place]

## 2020-10-25 NOTE — Progress Notes (Signed)
HD#3 Subjective:   No acute events overnight.   During evaluation at bedside during rounds, patient states he is feeling "so much better" compared to when he was admitted. Reports he is thinking much more clearly though does note slight "fuzziness" in his thinking. Expresses understanding of nephrology's plan to increase his run time for his hemodialysis. States he is ready for discharge.  Objective:  Vital signs in last 24 hours: Vitals:   10/25/20 0443 10/25/20 0444 10/25/20 0521 10/25/20 0646  BP:  (!) 196/91 (!) 186/88 (!) 174/84  Pulse:      Resp:      Temp:  98 F (36.7 C)    TempSrc:  Oral    SpO2:  98%    Weight: 52.3 kg     Height:       Constitutional: well-appearing man sitting up in bed, in no acute distress HENT: normocephalic atraumatic, mucous membranes moist Cardiovascular: regular rate and rhythm, no m/r/g Pulmonary/Chest: normal work of breathing on room air, lungs clear to auscultation bilaterally Abdominal: soft, non-tender, non-distended Neurological: alert & oriented x 3. Myoclonus of head and extremities resolved. Able to say the months backwards, whereas yesterday he could not  Pertinent Labs: CBC Latest Ref Rng & Units 10/23/2020 10/23/2020 10/22/2020  WBC 4.0 - 10.5 K/uL 2.5(L) 2.3(L) 3.0(L)  Hemoglobin 13.0 - 17.0 g/dL 13.2 12.6(L) 12.7(L)  Hematocrit 39.0 - 52.0 % 41.2 38.4(L) 40.2  Platelets 150 - 400 K/uL 58(L) 73(L) 59(L)    CMP Latest Ref Rng & Units 10/23/2020 10/23/2020 10/23/2020  Glucose 70 - 99 mg/dL 97 - -  BUN 8 - 23 mg/dL 16 - -  Creatinine 0.61 - 1.24 mg/dL 6.07(H) - -  Sodium 135 - 145 mmol/L 138 - -  Potassium 3.5 - 5.1 mmol/L 3.9 - -  Chloride 98 - 111 mmol/L 97(L) - -  CO2 22 - 32 mmol/L 27 - -  Calcium 8.9 - 10.3 mg/dL 9.2 - -  Total Protein 6.5 - 8.1 g/dL - 6.1(L) 6.8  Total Bilirubin 0.3 - 1.2 mg/dL - 0.9 0.4  Alkaline Phos 38 - 126 U/L - 39 40  AST 15 - 41 U/L - 19 19  ALT 0 - 44 U/L - 11 11      Assessment/Plan:   Active Problems:   Hypertensive emergency   Malnutrition of moderate degree   Patient Summary: Maurice Bell is a 64 y.o. man with hx of CVA, ESRD on TTS HD, HTN, STEMI in January 2021 who presented with altered mental status suspected secondary to hypertensive emergency vs uremic encephalopathy. His mentation has improved significantly after dialysis. Discharging home today with next HD on holiday schedule 10/27/20.  Today is hospital day 3.  Acute encephalopathy Uremic encephalopathy ESRD (on TTS HD) HTN Patient with significantly improved mentation, resolution of expressive aphasia, and improved myoclonus this morning after HD 12/22 and 12/23. BP better controlled however still above goal overnight. Per nephrology, patient will likely require a longer run time as an outpatient for adequate fluid removal for BP control. Patient is stable for discharge from nephrology and our perspective. - Appreciate nephrology's assistance - BP control per nephrology - Next HD 10/27/20  Chronic low back pain Patient reports he follows with [facility] for pain management, takes Percocet 5-325 mg three times daily as needed for chronic low back pain.  - Tylenol 650 mg q6h PrN for mild pain - Percocet 5-325 mg q12h PRN for severe pain - Resume home frequency  at discharge  Constipation Patient manually disimpacted himself two nights ago. States he had a well-formed BM today after initiation of Senokot-S.  - Miralax nightly - Senokot-S  Macrocytic anemia Thrombocytopenia Leukopenia Stable.  - Further work-up as an outpatient  HCV  Hx of HBV infection Patient has history of HCV but unknown if he has been treated for this. He will need follow-up with infectious disease for further evaluation management.  CAD Hx of MI Patient has a history of MI on aspirin and statin. -Aspirin 81 mg and moderate intensity statin  Diet: Renal diet IVF: None  Code:  Full    Alexandria Lodge, MD Internal Medicine Resident, PGY-1 [facility]  Pager: [phone] 9:04 AM, 10/25/2020   Please contact the on call pager after 5 pm and on weekends at [phone].

## 2020-10-25 NOTE — Progress Notes (Signed)
[facility] Progress Note   Subjective: Seen in room, feels better. Does say that he had some blood in stool last BM but HGB >12. Discussed increasing HD time and he agrees to this plan.     Objective Vitals:   10/25/20 0443 10/25/20 0444 10/25/20 0521 10/25/20 0646  BP:  (!) 196/91 (!) 186/88 (!) 174/84  Pulse:      Resp:      Temp:  98 F (36.7 C)    TempSrc:  Oral    SpO2:  98%    Weight: 52.3 kg     Height:       Physical Exam General: chronically ill appearing thin male in NAD Heart: S1,S2 RRR SR on monitor Lungs: CTAB Abdomen: Soft, NT Extremities: No LE edema Dialysis Access: RUA AVF + bruit. Has multiple failed AV accesses.   Additional Objective Labs: Basic Metabolic Panel: Recent Labs  Lab 10/22/20 1252 10/23/20 0343 10/23/20 1008 10/23/20 1920  NA 137 139  --  138  K 5.4* 5.0  --  3.9  CL 94* 96*  --  97*  CO2 28 25  --  27  GLUCOSE 85 83  --  97  BUN 45* 53*  --  16  CREATININE 11.78* 13.11*  --  6.07*  CALCIUM 9.4 9.2  --  9.2  PHOS  --   --  6.1* 3.3   Liver Function Tests: Recent Labs  Lab 10/22/20 1252 10/23/20 1008 10/23/20 1920  AST 24 19  19   --   ALT 12 11  11   --   ALKPHOS 40 39  40  --   BILITOT 0.5 0.9  0.4  --   PROT 6.6 6.1*  6.8  --   ALBUMIN 3.8 3.5  3.6 3.9   No results for input(s): LIPASE, AMYLASE in the last 168 hours. CBC: Recent Labs  Lab 10/22/20 1252 10/23/20 0343 10/23/20 1920  WBC 3.0* 2.3* 2.5*  NEUTROABS 1.8 1.0*  --   HGB 12.7* 12.6* 13.2  HCT 40.2 38.4* 41.2  MCV 111.4* 110.0* 109.6*  PLT 59* 73* 58*   Blood Culture No results found for: SDES, SPECREQUEST, CULT, REPTSTATUS  Cardiac Enzymes: No results for input(s): CKTOTAL, CKMB, CKMBINDEX, TROPONINI in the last 168 hours. CBG: No results for input(s): GLUCAP in the last 168 hours. Iron Studies: No results for input(s): IRON, TIBC, TRANSFERRIN, FERRITIN in the last 72 hours. @lablastinr3 @ Studies/Results: US Abdomen  Complete  Result Date: 10/23/2020 CLINICAL DATA:  AMS, distended abdomen, HCV and hepatic fibrosis. EXAM: ABDOMEN ULTRASOUND COMPLETE COMPARISON:  10/22/2020, 03/12/2019. FINDINGS: Gallbladder: No gallstones or wall thickening visualized. Intraluminal polyp measuring up to 4.2 mm. No sonographic Murphy sign noted by sonographer. Common bile duct: Diameter: 4.7 mm Liver: No focal lesion identified. Increased parenchymal echogenicity. Portal vein is patent on color Doppler imaging with normal direction of blood flow towards the liver. IVC: Not well visualized. Pancreas: Visualized portion unremarkable. Spleen: Size and appearance within normal limits. Right Kidney: Length: 9.8 cm. Increased parenchymal echogenicity. No hydronephrosis visualized. Cysts measuring up to 3.6 cm. Left Kidney: Length: 10.0 cm. Increased parenchymal echogenicity. No hydronephrosis visualized. Cysts measuring up to 4.6 cm. Abdominal aorta: No aneurysm visualized. Other findings: Overlying bowel gas and patient disposition limits evaluation. IMPRESSION: Bilateral renal cysts. Echogenic renal parenchyma may reflect sequela of medical renal disease. Hepatic steatosis.  No focal hepatic lesion. Incidental gallbladder polyps measuring up to 4 mm. Electronically Signed   By: Milus Mallick.D.  On: 10/23/2020 12:18   Medications:  . amLODipine  10 mg Oral Daily  . Chlorhexidine Gluconate Cloth  6 each Topical Q0600  . feeding supplement (NEPRO CARB STEADY)  237 mL Oral BID BM  . hydrALAZINE  50 mg Oral Q8H  . metoprolol tartrate  150 mg Oral BID  . multivitamin  1 tablet Oral QHS  . nicotine  7 mg Transdermal Daily  . polyethylene glycol  17 g Oral QHS  . sevelamer carbonate  3,200 mg Oral TID WC     [facility]  3 hr 15 min EDW 57.5 kg 2K/ 2 Ca  BFR 450/ DFR 800 AVF hectorol 5 mg q rx No heparin/ ESA  Assessment/Plan: 1Expressive aphasia/ myoclonus: Resolved with HD. He has been compliant  with HD but treatment time is too short. Increase treatment  time to 4 hours.  2 ESRD:TTS at [facility]. Next HD 10/27/2020 on Holiday schedule. He drives to HD so Holiday schedule not an issue,  3 Hypertension:HD 12/23 Net UF 2.5 got to OP EDW. No volume excess by exam. BP high, home meds have been resumed.  4. 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 Metabolic Bone Disease:hectorol 5 mcg q rx 6. Nutrition: renal diet and vitamins   Disposition: Stable from Nephrology stand point for discharge.   Lamyra Malcolm H. Shaquandra Galano NP-C 10/25/2020, 8:55 AM  [employer] [phone]

## 2020-10-27 ENCOUNTER — Telehealth: Payer: Self-pay | Admitting: Nurse Practitioner

## 2020-10-27 NOTE — Telephone Encounter (Signed)
Transition of care contact from inpatient facility  Date of Discharge: 10/25/2020 Date of Contact: 10/27/2020 Method of contact: Phone  Attempted to contact patient to discuss transition of care from inpatient admission. Patient did not answer the phone. Message was left on the patient's voicemail with call back number [phone].

## 2020-10-28 ENCOUNTER — Encounter: Payer: Self-pay | Admitting: Physical Medicine and Rehabilitation

## 2020-11-02 ENCOUNTER — Encounter (HOSPITAL_COMMUNITY): Payer: Self-pay | Admitting: Emergency Medicine

## 2020-11-02 ENCOUNTER — Other Ambulatory Visit: Payer: Self-pay

## 2020-11-02 ENCOUNTER — Emergency Department (HOSPITAL_COMMUNITY): Payer: Medicare HMO

## 2020-11-02 ENCOUNTER — Inpatient Hospital Stay (HOSPITAL_COMMUNITY)
Admission: EM | Admit: 2020-11-02 | Discharge: 2020-11-05 | DRG: 091 | Disposition: A | Discharge status: Home / Self Care | Payer: Medicare HMO | Attending: Internal Medicine | Admitting: Internal Medicine

## 2020-11-02 DIAGNOSIS — G929 Unspecified toxic encephalopathy: Secondary | ICD-10-CM | POA: Diagnosis present

## 2020-11-02 DIAGNOSIS — K760 Fatty (change of) liver, not elsewhere classified: Secondary | ICD-10-CM | POA: Diagnosis present

## 2020-11-02 DIAGNOSIS — T426X5A Adverse effect of other antiepileptic and sedative-hypnotic drugs, initial encounter: Secondary | ICD-10-CM | POA: Diagnosis present

## 2020-11-02 DIAGNOSIS — G9341 Metabolic encephalopathy: Secondary | ICD-10-CM | POA: Diagnosis present

## 2020-11-02 DIAGNOSIS — G9340 Encephalopathy, unspecified: Secondary | ICD-10-CM | POA: Diagnosis present

## 2020-11-02 DIAGNOSIS — E538 Deficiency of other specified B group vitamins: Secondary | ICD-10-CM | POA: Diagnosis present

## 2020-11-02 DIAGNOSIS — Z8673 Personal history of transient ischemic attack (TIA), and cerebral infarction without residual deficits: Secondary | ICD-10-CM | POA: Diagnosis not present

## 2020-11-02 DIAGNOSIS — T402X5A Adverse effect of other opioids, initial encounter: Secondary | ICD-10-CM | POA: Diagnosis present

## 2020-11-02 DIAGNOSIS — G253 Myoclonus: Secondary | ICD-10-CM | POA: Diagnosis present

## 2020-11-02 DIAGNOSIS — Z7982 Long term (current) use of aspirin: Secondary | ICD-10-CM

## 2020-11-02 DIAGNOSIS — I120 Hypertensive chronic kidney disease with stage 5 chronic kidney disease or end stage renal disease: Secondary | ICD-10-CM | POA: Diagnosis present

## 2020-11-02 DIAGNOSIS — Z992 Dependence on renal dialysis: Secondary | ICD-10-CM | POA: Diagnosis not present

## 2020-11-02 DIAGNOSIS — K5909 Other constipation: Secondary | ICD-10-CM | POA: Diagnosis present

## 2020-11-02 DIAGNOSIS — N186 End stage renal disease: Secondary | ICD-10-CM | POA: Diagnosis present

## 2020-11-02 DIAGNOSIS — D631 Anemia in chronic kidney disease: Secondary | ICD-10-CM | POA: Diagnosis present

## 2020-11-02 DIAGNOSIS — K219 Gastro-esophageal reflux disease without esophagitis: Secondary | ICD-10-CM | POA: Diagnosis present

## 2020-11-02 DIAGNOSIS — B182 Chronic viral hepatitis C: Secondary | ICD-10-CM | POA: Diagnosis present

## 2020-11-02 DIAGNOSIS — B1910 Unspecified viral hepatitis B without hepatic coma: Secondary | ICD-10-CM | POA: Diagnosis present

## 2020-11-02 DIAGNOSIS — U071 COVID-19: Secondary | ICD-10-CM | POA: Diagnosis present

## 2020-11-02 DIAGNOSIS — I10 Essential (primary) hypertension: Secondary | ICD-10-CM | POA: Diagnosis present

## 2020-11-02 DIAGNOSIS — Z981 Arthrodesis status: Secondary | ICD-10-CM

## 2020-11-02 DIAGNOSIS — Y929 Unspecified place or not applicable: Secondary | ICD-10-CM

## 2020-11-02 DIAGNOSIS — G8929 Other chronic pain: Secondary | ICD-10-CM | POA: Diagnosis present

## 2020-11-02 DIAGNOSIS — R369 Urethral discharge, unspecified: Secondary | ICD-10-CM | POA: Diagnosis not present

## 2020-11-02 DIAGNOSIS — E8889 Other specified metabolic disorders: Secondary | ICD-10-CM | POA: Diagnosis present

## 2020-11-02 DIAGNOSIS — K5904 Chronic idiopathic constipation: Secondary | ICD-10-CM | POA: Diagnosis not present

## 2020-11-02 DIAGNOSIS — R4701 Aphasia: Secondary | ICD-10-CM | POA: Diagnosis present

## 2020-11-02 DIAGNOSIS — R278 Other lack of coordination: Secondary | ICD-10-CM | POA: Diagnosis present

## 2020-11-02 DIAGNOSIS — Z885 Allergy status to narcotic agent status: Secondary | ICD-10-CM

## 2020-11-02 DIAGNOSIS — F32A Depression, unspecified: Secondary | ICD-10-CM | POA: Diagnosis present

## 2020-11-02 DIAGNOSIS — Z833 Family history of diabetes mellitus: Secondary | ICD-10-CM

## 2020-11-02 DIAGNOSIS — Z79899 Other long term (current) drug therapy: Secondary | ICD-10-CM

## 2020-11-02 DIAGNOSIS — I252 Old myocardial infarction: Secondary | ICD-10-CM | POA: Diagnosis not present

## 2020-11-02 DIAGNOSIS — D61818 Other pancytopenia: Secondary | ICD-10-CM | POA: Diagnosis present

## 2020-11-02 DIAGNOSIS — F17210 Nicotine dependence, cigarettes, uncomplicated: Secondary | ICD-10-CM | POA: Diagnosis present

## 2020-11-02 DIAGNOSIS — Z8249 Family history of ischemic heart disease and other diseases of the circulatory system: Secondary | ICD-10-CM

## 2020-11-02 DIAGNOSIS — K5903 Drug induced constipation: Secondary | ICD-10-CM | POA: Diagnosis present

## 2020-11-02 DIAGNOSIS — F419 Anxiety disorder, unspecified: Secondary | ICD-10-CM | POA: Diagnosis present

## 2020-11-02 DIAGNOSIS — Z79891 Long term (current) use of opiate analgesic: Secondary | ICD-10-CM

## 2020-11-02 DIAGNOSIS — G3184 Mild cognitive impairment, so stated: Secondary | ICD-10-CM | POA: Diagnosis present

## 2020-11-02 DIAGNOSIS — R531 Weakness: Secondary | ICD-10-CM

## 2020-11-02 LAB — CBC
HCT: 37 % — ABNORMAL LOW (ref 39.0–52.0)
Hemoglobin: 11.8 g/dL — ABNORMAL LOW (ref 13.0–17.0)
MCH: 34.7 pg — ABNORMAL HIGH (ref 26.0–34.0)
MCHC: 31.9 g/dL (ref 30.0–36.0)
MCV: 108.8 fL — ABNORMAL HIGH (ref 80.0–100.0)
Platelets: 63 10*3/uL — ABNORMAL LOW (ref 150–400)
RBC: 3.4 MIL/uL — ABNORMAL LOW (ref 4.22–5.81)
RDW: 13.3 % (ref 11.5–15.5)
WBC: 1.9 10*3/uL — ABNORMAL LOW (ref 4.0–10.5)
nRBC: 0 % (ref 0.0–0.2)

## 2020-11-02 LAB — BASIC METABOLIC PANEL
Anion gap: 14 (ref 5–15)
BUN: 34 mg/dL — ABNORMAL HIGH (ref 8–23)
CO2: 28 mmol/L (ref 22–32)
Calcium: 9.6 mg/dL (ref 8.9–10.3)
Chloride: 95 mmol/L — ABNORMAL LOW (ref 98–111)
Creatinine, Ser: 8.92 mg/dL — ABNORMAL HIGH (ref 0.61–1.24)
GFR, Estimated: 6 mL/min — ABNORMAL LOW (ref >60)
Glucose, Bld: 81 mg/dL (ref 70–99)
Potassium: 4.7 mmol/L (ref 3.5–5.1)
Sodium: 137 mmol/L (ref 135–145)

## 2020-11-02 LAB — RESP PANEL BY RT-PCR (FLU A&B, COVID) ARPGX2
Influenza A by PCR: NEGATIVE
Influenza B by PCR: NEGATIVE
SARS Coronavirus 2 by RT PCR: POSITIVE — AB

## 2020-11-02 MED ORDER — HYDRALAZINE HCL 50 MG PO TABS: 100.0000 mg | ORAL_TABLET | Freq: Two times a day (BID) | ORAL | Status: DC

## 2020-11-02 MED ORDER — METOPROLOL TARTRATE 25 MG PO TABS
150.0000 mg | ORAL_TABLET | Freq: Once | ORAL | Status: AC
  Administered 2020-11-02: 150 mg via ORAL
  Filled 2020-11-02: qty 6

## 2020-11-02 MED ORDER — SODIUM CHLORIDE 0.9% FLUSH
3.0000 mL | Freq: Once | INTRAVENOUS | Status: AC
  Administered 2020-11-03: 3 mL via INTRAVENOUS

## 2020-11-02 MED ORDER — HYDRALAZINE HCL 50 MG PO TABS
50.0000 mg | ORAL_TABLET | Freq: Once | ORAL | Status: AC
  Administered 2020-11-02: 50 mg via ORAL
  Filled 2020-11-02: qty 1

## 2020-11-02 MED ORDER — METOPROLOL TARTRATE 25 MG PO TABS
150.0000 mg | ORAL_TABLET | Freq: Two times a day (BID) | ORAL | Status: DC
  Administered 2020-11-03 – 2020-11-04 (×3): 150 mg via ORAL
  Filled 2020-11-02 (×6): qty 6

## 2020-11-02 MED ORDER — LABETALOL HCL 5 MG/ML IV SOLN
20.0000 mg | Freq: Once | INTRAVENOUS | Status: AC
  Administered 2020-11-02: 20 mg via INTRAVENOUS
  Filled 2020-11-02: qty 4

## 2020-11-02 MED ORDER — POLYETHYLENE GLYCOL 3350 17 G PO PACK: 17.0000 g | PACK | Freq: Every day | ORAL | Status: DC | PRN

## 2020-11-02 MED ORDER — AMLODIPINE BESYLATE 5 MG PO TABS
10.0000 mg | ORAL_TABLET | Freq: Every day | ORAL | Status: DC
  Administered 2020-11-03 – 2020-11-04 (×3): 10 mg via ORAL
  Filled 2020-11-02 (×3): qty 2

## 2020-11-02 MED ORDER — ASPIRIN EC 81 MG PO TBEC
81.0000 mg | DELAYED_RELEASE_TABLET | Freq: Every day | ORAL | Status: DC
  Administered 2020-11-03 – 2020-11-05 (×3): 81 mg via ORAL
  Filled 2020-11-02 (×3): qty 1

## 2020-11-02 NOTE — ED Notes (Signed)
Patient reports he doesn't make urine.

## 2020-11-02 NOTE — ED Triage Notes (Signed)
Pt to triage via [facility] from home.  Pt is compliant with dialysis but they were closed today due to holiday and he is supposed to go tomorrow.  Reports generalized weakness, expressive aphasia, "memory lapses", and hearing "birds" in bilateral ears.  EMS reports symptoms started last night at 10pm.  Pt states he was admitted just over a week ago for same symptoms and had MRI.  Pt states he has had problems with BP being elevated.  CBG 98.
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
## 2020-11-02 NOTE — H&P (Incomplete)
NAME:  Maurice Bell, MRN:  [id number], DOB:  Jul 19, 1956, LOS: 0 ADMISSION DATE:  11/02/2020, Primary: Patient, No Pcp Per  CHIEF COMPLAINT:    Medical Service: Internal Medicine Teaching Service         Attending Physician: Dr. Lajean Saver, MD    First Contact: Dr. Shon Baton Pager: [phone]  Second Contact: Dr. Gilford Rile Pager: [phone]       After Hours (After 5p/  First Contact Pager: [phone]  weekends / holidays): Second Contact Pager: [phone]   HISTORY OF PRESENT ILLNESS  ***  PCP: Patient, No Pcp Per  ED COURSE  ***  PAST MEDICAL HISTORY  He,  has a past medical history of Anemia, Anxiety, Arthritis, Depression, Dyspnea, ESRD (end stage renal disease) on dialysis ([place]), GERD (gastroesophageal reflux disease), Hepatitis C, History of anemia due to CKD, Hypertension, Myoclonic jerking, Pneumonia, Renal disorder, Stroke ([place]), Wears glasses, and Wears partial dentures.   HOME MEDICATIONS   Prior to Admission medications   Medication Sig Start Date End Date Taking? Authorizing Provider  acetaminophen (TYLENOL 8 HOUR ARTHRITIS PAIN) 650 MG CR tablet Take 1,300 mg by mouth every 8 (eight) hours as needed for pain.    [provider]  amLODipine (NORVASC) 10 MG tablet Take 1 tablet (10 mg total) by mouth at bedtime. 05/03/18   [name], PA-C  amLODipine (NORVASC) 10 MG tablet Take 10 mg by mouth daily.    [provider]  aspirin EC 81 MG tablet Take 81 mg by mouth daily.    [provider]  B Complex-C-Folic Acid (NEPHRO-VITE PO) Take 0.8 mg by mouth.    [provider]  gabapentin (NEURONTIN) 100 MG capsule Take 100 mg by mouth 3 (three) times daily as needed (for neuropathic pain).    [provider]  hydrALAZINE (APRESOLINE) 50 MG tablet Take 50 mg by mouth 2 (two) times a day.  12/27/18   [provider]  hydrALAZINE (APRESOLINE) 50 MG tablet Take 50 mg by mouth 3 (three) times daily.    [provider]   Menthol-Methyl Salicylate (ICY HOT) 97-67 % STCK Apply 1 application topically as needed (joint pain).    [provider]  metoprolol tartrate (LOPRESSOR) 50 MG tablet Take 150 mg by mouth 2 (two) times daily. 12/13/18   [provider]  metoprolol tartrate (LOPRESSOR) 50 MG tablet Take 150 mg by mouth 2 (two) times daily.    [provider]  multivitamin (RENA-VIT) TABS tablet Take 1 tablet by mouth at bedtime. 09/11/18   [name], [name], MD  NEURONTIN 100 MG capsule Take 100 mg by mouth 3 (three) times daily. 06/29/20   [provider]  nicotine (NICODERM CQ - DOSED IN MG/24 HOURS) 14 mg/24hr patch Apply 14mg  patch one daily for 6 weeks, then change to 14mg  patch. Remove old patch before applying new one. 12/13/19   Imogene Burn, PA-C  nicotine (NICODERM CQ - DOSED IN MG/24 HR) 7 mg/24hr patch Apply 7mg  patch one daily for 2 weeks. Remove old patch before applying new one. 12/13/19   Imogene Burn, PA-C  oxyCODONE-acetaminophen (PERCOCET/ROXICET) 5-325 MG tablet Take 1 tablet by mouth every 8 (eight) hours as needed for severe pain. 06/14/19   Marybelle Killings, MD  oxyCODONE-acetaminophen (PERCOCET/ROXICET) 5-325 MG tablet Take 1 tablet by mouth every 4 (four) hours as needed for severe pain.    [provider]  senna (SENOKOT) 8.6 MG TABS tablet Take 1 tablet (8.6 mg total)  by mouth daily as needed for mild constipation. 10/25/20   Alexandria Lodge, MD  sevelamer carbonate (RENVELA) 800 MG tablet Take 4 tablets (3,200 mg total) by mouth 3 (three) times daily with meals. 09/11/18   [name], [name], MD  sevelamer carbonate (RENVELA) 800 MG tablet Take 3,200 mg by mouth 3 (three) times daily with meals.    [provider]  sildenafil (VIAGRA) 50 MG tablet Take 50 mg by mouth daily as needed for erectile dysfunction.    [provider]  sodium zirconium cyclosilicate (LOKELMA) 10 g PACK packet Take 10 g by mouth every Monday.     [provider]  Sofosbuvir-Velpatasvir (EPCLUSA) 400-100 MG TABS Take 1 tablet by mouth daily. 01/10/20   Kuppelweiser, Cassie L, RPH-CPP  valACYclovir (VALTREX) 500 MG tablet Take 1 tablet (500 mg total) by mouth daily. 05/29/19   [name], NP    ALLERGIES   Allergies as of 11/02/2020 - Review Complete 10/22/2020  Allergen Reaction Noted  . Vicodin [hydrocodone-acetaminophen] Itching 04/26/2019   SOCIAL HISTORY  ***  FAMILY HISTORY  His family history includes Diabetes in his father and mother; High blood pressure in an other family member; Hypertension in his father, mother, and son.   REVIEW OF SYSTEMS  ROS per history of present illness.  PHYSICAL EXAMINATION  Blood pressure (!) 218/96, pulse 71, temperature 99.1 F (37.3 C), temperature source Oral, resp. rate 12, height 5\' 5"  (1.651 m), weight 52.2 kg, SpO2 97 %.    Filed Weights   11/02/20 1521  Weight: 52.2 kg   GENERAL:  HENT:  EYES:  CV:  PULM:  ABD:   MSK:  SKIN:  NEURO:  PSYCH:   SIGNIFICANT DIAGNOSTIC TESTS  ECG:  I personally reviewed patient's ECG with my interpretation as above.  CXR:   I personally reviewed patient's CXR with my interpretation as above.  LABS   CBC Latest Ref Rng & Units 11/02/2020 10/23/2020 10/23/2020  WBC 4.0 - 10.5 K/uL 1.9(L) 2.5(L) 2.3(L)  Hemoglobin 13.0 - 17.0 g/dL 11.8(L) 13.2 12.6(L)  Hematocrit 39.0 - 52.0 % 37.0(L) 41.2 38.4(L)  Platelets 150 - 400 K/uL 63(L) 58(L) 73(L)   BMP Latest Ref Rng & Units 11/02/2020 10/23/2020 10/23/2020  Glucose 70 - 99 mg/dL 81 97 83  BUN 8 - 23 mg/dL 34(H) 16 53(H)  Creatinine 0.61 - 1.24 mg/dL 8.92(H) 6.07(H) 13.11(H)  BUN/Creat Ratio 10 - 24 - - -  Sodium 135 - 145 mmol/L 137 138 139  Potassium 3.5 - 5.1 mmol/L 4.7 3.9 5.0  Chloride 98 - 111 mmol/L 95(L) 97(L) 96(L)  CO2 22 - 32 mmol/L 28 27 25   Calcium 8.9 - 10.3 mg/dL 9.6 9.2 9.2   CONSULTS  ***  ASSESSMENT  ***  PLAN  Active Problems:   * No active hospital  problems. *   BEST PRACTICE  DIET: IVF: DVT PPX: BOWEL: CODE: FAM COM:   DISPO: Admit patient to {STATUS:3044014::"Observation with expected length of stay less than 2 midnights.","Inpatient with expected length of stay greater than 2 midnights."}  Sanjuan Dame, MD Internal Medicine Resident PGY-1 PAGER: [phone] 11/02/20 7:51 PM  If after hours (below), please contact on-call pager: [phone] 5PM-7AM Monday-Friday 1PM-7AM Saturday-Sunday

## 2020-11-02 NOTE — H&P (Incomplete)
NAME:  Maurice Bell, MRN:  [id number], DOB:  08/23/1956, LOS: 0 ADMISSION DATE:  11/02/2020, Primary: Patient, No Pcp Per  CHIEF COMPLAINT:    Medical Service: Internal Medicine Teaching Service         Attending Physician: Dr. Evette Doffing, Mallie Mussel, *    First Contact: Dr. Shon Baton Pager: [phone]  Second Contact: Dr. Gilford Rile Pager: [phone]       After Hours (After 5p/  First Contact Pager: [phone]  weekends / holidays): Second Contact Pager: [phone]  Maurice Bell is 65 year-old male with ESRD on TTS HD schedule, resistant hypertension, hx CVA, hx STEMI 11/2019 (cath without apparent CAD) who presents to [facility] via EMS for memory loss, generalized weakness, and difficulty speaking. Specific details regarding history difficult to obtain give patient's presenting symptoms. He was previously admitted on 10/23/2020 for similar presentation and thought to be 2/2 uremic encephalopathy. Since discharge, patient reports attending dialysis with his regular schedule, last session being Thursday, 10/31/20.   Patient states he woke up this morning with worsening neurologic symptoms. He says he is having trouble "thinking in general" Patient has difficulty characterizing these symptoms specifically. He states he just feels "weak all over" and does not feel like himself. Denies fevers, chills, headache, blurry vision, chest pain, dyspnea, nausea, vomiting, abdominal pain, diarrhea, leg swelling. He states he previously had a syncopal episode but cannot remember when. He remembers standing up, walking, and then blacking out. Mentions he felt confused after the event, cannot recall if he had a tongue injury. Denies known history of seizures. He also mentions he had previous stroke  Maurice Bell also mentions he   He says his memory problems and difficulty speaking have continued. Unclear if this has progressed since last admission. Overall, he says he is having trouble  "thinking in general." He is not sure how long this has been occurring. He mentions that he     PCP: Patient, No Pcp Per  ED COURSE    PAST MEDICAL HISTORY  He,  has a past medical history of Anemia, Anxiety, Arthritis, Depression, Dyspnea, ESRD (end stage renal disease) on dialysis ([place]), GERD (gastroesophageal reflux disease), Hepatitis C, History of anemia due to CKD, Hypertension, Myoclonic jerking, Pneumonia, Renal disorder, Stroke [facility]), Wears glasses, and Wears partial dentures.   HOME MEDICATIONS   Prior to Admission medications   Medication Sig Start Date End Date Taking? Authorizing Provider  acetaminophen (TYLENOL 8 HOUR ARTHRITIS PAIN) 650 MG CR tablet Take 1,300 mg by mouth every 8 (eight) hours as needed for pain.    [provider]  amLODipine (NORVASC) 10 MG tablet Take 1 tablet (10 mg total) by mouth at bedtime. 05/03/18   [name], PA-C  amLODipine (NORVASC) 10 MG tablet Take 10 mg by mouth daily.    [provider]  aspirin EC 81 MG tablet Take 81 mg by mouth daily.    [provider]  B Complex-C-Folic Acid (NEPHRO-VITE PO) Take 0.8 mg by mouth.    [provider]  gabapentin (NEURONTIN) 100 MG capsule Take 100 mg by mouth 3 (three) times daily as needed (for neuropathic pain).    [provider]  hydrALAZINE (APRESOLINE) 50 MG tablet Take 50 mg by mouth 2 (two) times a day.  12/27/18   [provider]  hydrALAZINE (APRESOLINE) 50 MG tablet Take 50 mg by mouth 3 (three) times daily.    [provider]  Menthol-Methyl Salicylate (ICY  HOT) 10-30 % STCK Apply 1 application topically as needed (joint pain).    [provider]  metoprolol tartrate (LOPRESSOR) 50 MG tablet Take 150 mg by mouth 2 (two) times daily. 12/13/18   [provider]  metoprolol tartrate (LOPRESSOR) 50 MG tablet Take 150 mg by mouth 2 (two) times daily.    [provider]  multivitamin (RENA-VIT) TABS tablet  Take 1 tablet by mouth at bedtime. 09/11/18   [name], [name], MD  NEURONTIN 100 MG capsule Take 100 mg by mouth 3 (three) times daily. 06/29/20   [provider]  nicotine (NICODERM CQ - DOSED IN MG/24 HOURS) 14 mg/24hr patch Apply 14mg  patch one daily for 6 weeks, then change to 14mg  patch. Remove old patch before applying new one. 12/13/19   Imogene Burn, PA-C  nicotine (NICODERM CQ - DOSED IN MG/24 HR) 7 mg/24hr patch Apply 7mg  patch one daily for 2 weeks. Remove old patch before applying new one. 12/13/19   Imogene Burn, PA-C  oxyCODONE-acetaminophen (PERCOCET/ROXICET) 5-325 MG tablet Take 1 tablet by mouth every 8 (eight) hours as needed for severe pain. 06/14/19   Marybelle Killings, MD  oxyCODONE-acetaminophen (PERCOCET/ROXICET) 5-325 MG tablet Take 1 tablet by mouth every 4 (four) hours as needed for severe pain.    [provider]  senna (SENOKOT) 8.6 MG TABS tablet Take 1 tablet (8.6 mg total) by mouth daily as needed for mild constipation. 10/25/20   Alexandria Lodge, MD  sevelamer carbonate (RENVELA) 800 MG tablet Take 4 tablets (3,200 mg total) by mouth 3 (three) times daily with meals. 09/11/18   [name], [name], MD  sevelamer carbonate (RENVELA) 800 MG tablet Take 3,200 mg by mouth 3 (three) times daily with meals.    [provider]  sildenafil (VIAGRA) 50 MG tablet Take 50 mg by mouth daily as needed for erectile dysfunction.    [provider]  sodium zirconium cyclosilicate (LOKELMA) 10 g PACK packet Take 10 g by mouth every Monday.     [provider]  Sofosbuvir-Velpatasvir (EPCLUSA) 400-100 MG TABS Take 1 tablet by mouth daily. 01/10/20   Kuppelweiser, Cassie L, RPH-CPP  valACYclovir (VALTREX) 500 MG tablet Take 1 tablet (500 mg total) by mouth daily. 05/29/19   [name], NP    ALLERGIES   Allergies as of 11/02/2020 - Review Complete 10/22/2020  Allergen Reaction Noted  . Vicodin [hydrocodone-acetaminophen] Itching 04/26/2019    SOCIAL HISTORY  ***  FAMILY HISTORY  His family history includes Diabetes in his father and mother; High blood pressure in an other family member; Hypertension in his father, mother, and son.   REVIEW OF SYSTEMS  ROS per history of present illness.  PHYSICAL EXAMINATION  Blood pressure (!) 218/96, pulse 71, temperature 99.1 F (37.3 C), temperature source Oral, resp. rate 12, height 5\' 5"  (1.651 m), weight 52.2 kg, SpO2 97 %.    Filed Weights   11/02/20 1521  Weight: 52.2 kg   GENERAL:  HENT:  EYES:  CV:  PULM:  ABD: GU:   MSK:  SKIN:  NEURO:  PSYCH:   SIGNIFICANT DIAGNOSTIC TESTS  ECG:  I personally reviewed patient's ECG with my interpretation as above.  CXR:   I personally reviewed patient's CXR with my interpretation as above.  LABS   CBC Latest Ref Rng & Units 11/02/2020 10/23/2020 10/23/2020  WBC 4.0 - 10.5 K/uL 1.9(L) 2.5(L) 2.3(L)  Hemoglobin 13.0 - 17.0 g/dL 11.8(L) 13.2 12.6(L)  Hematocrit 39.0 -  52.0 % 37.0(L) 41.2 38.4(L)  Platelets 150 - 400 K/uL 63(L) 58(L) 73(L)   BMP Latest Ref Rng & Units 11/02/2020 10/23/2020 10/23/2020  Glucose 70 - 99 mg/dL 81 97 83  BUN 8 - 23 mg/dL 34(H) 16 53(H)  Creatinine 0.61 - 1.24 mg/dL 8.92(H) 6.07(H) 13.11(H)  BUN/Creat Ratio 10 - 24 - - -  Sodium 135 - 145 mmol/L 137 138 139  Potassium 3.5 - 5.1 mmol/L 4.7 3.9 5.0  Chloride 98 - 111 mmol/L 95(L) 97(L) 96(L)  CO2 22 - 32 mmol/L 28 27 25   Calcium 8.9 - 10.3 mg/dL 9.6 9.2 9.2   CONSULTS  nephrology  ASSESSMENT  ***  PLAN  Active Problems:   Encephalopathy   BEST PRACTICE  DIET: NPO pending SLP eval IVF: n/a DVT PPX: SCDs BOWEL: Miralax CODE: FULL FAM COM: Patient mentions he lives with his son, Maurice Bell, who is his primary contact.   DISPO: Admit patient to Inpatient with expected length of stay greater than 2 midnights.  Sanjuan Dame, MD Internal Medicine Resident PGY-1 PAGER: [phone] 11/02/20 7:51 PM  If after hours (below),  please contact on-call pager: [phone] 5PM-7AM Monday-Friday 1PM-7AM Saturday-Sunday

## 2020-11-02 NOTE — Hospital Course (Addendum)
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 male p/w hard time remembering stuff, standing and talking  * Acute sever symptomatic hypertension: Patient reports intermittent, waxing and waning episodes of difficulty speaking, decreased memory and weakness and hard time thinking. Admited for same last week. Neuro work up was negative last hospitalization. Thought to be 2/2 HTN emergency and uremia. CT head negative. BUN 34. Giving home antihypertensive meds and monitor. nephro will see him tomorrow for HD.  *COVID infection: No respiratory, flu like or GI symptoms. Is vaccinated. On RA. Consider Monoclonal AB.  *Pancytopenia: Started some work up last hospitalization. Retic index recently checked: 0.8 but Smear unremarkable except rare giant plt. Consider hem/onc consult/BM Bx.  *Penile discharge: Has white urethral discharge and reports bleeding from his penis when he has BM. No penile lesion. Denies high risk sexual behavior. Testing for STDs.   Consults: Nephro Meds: amlodpine, hydralazine, metoprolol VTE ppx: SCDs (thrombocytopenia) IVF: none Diet: npo + sip w meds until passes swallow eval

## 2020-11-03 ENCOUNTER — Inpatient Hospital Stay (HOSPITAL_COMMUNITY): Payer: Medicare HMO

## 2020-11-03 DIAGNOSIS — K5904 Chronic idiopathic constipation: Secondary | ICD-10-CM | POA: Diagnosis not present

## 2020-11-03 DIAGNOSIS — G9340 Encephalopathy, unspecified: Secondary | ICD-10-CM | POA: Diagnosis not present

## 2020-11-03 DIAGNOSIS — R369 Urethral discharge, unspecified: Secondary | ICD-10-CM

## 2020-11-03 DIAGNOSIS — D61818 Other pancytopenia: Secondary | ICD-10-CM | POA: Diagnosis not present

## 2020-11-03 DIAGNOSIS — U071 COVID-19: Secondary | ICD-10-CM | POA: Diagnosis present

## 2020-11-03 LAB — CBC WITH DIFFERENTIAL/PLATELET
Abs Immature Granulocytes: 0.01 10*3/uL (ref 0.00–0.07)
Basophils Absolute: 0 10*3/uL (ref 0.0–0.1)
Basophils Relative: 1 %
Eosinophils Absolute: 0 10*3/uL (ref 0.0–0.5)
Eosinophils Relative: 0 %
HCT: 38.5 % — ABNORMAL LOW (ref 39.0–52.0)
Hemoglobin: 12.2 g/dL — ABNORMAL LOW (ref 13.0–17.0)
Immature Granulocytes: 1 %
Lymphocytes Relative: 40 %
Lymphs Abs: 0.5 10*3/uL — ABNORMAL LOW (ref 0.7–4.0)
MCH: 34.3 pg — ABNORMAL HIGH (ref 26.0–34.0)
MCHC: 31.7 g/dL (ref 30.0–36.0)
MCV: 108.1 fL — ABNORMAL HIGH (ref 80.0–100.0)
Monocytes Absolute: 0.3 10*3/uL (ref 0.1–1.0)
Monocytes Relative: 25 %
Neutro Abs: 0.4 10*3/uL — CL (ref 1.7–7.7)
Neutrophils Relative %: 33 %
Platelets: 61 10*3/uL — ABNORMAL LOW (ref 150–400)
RBC: 3.56 MIL/uL — ABNORMAL LOW (ref 4.22–5.81)
RDW: 13.2 % (ref 11.5–15.5)
WBC: 1.2 10*3/uL — CL (ref 4.0–10.5)
nRBC: 0 % (ref 0.0–0.2)

## 2020-11-03 LAB — COMPREHENSIVE METABOLIC PANEL
ALT: 12 U/L (ref 0–44)
AST: 28 U/L (ref 15–41)
Albumin: 3.4 g/dL — ABNORMAL LOW (ref 3.5–5.0)
Alkaline Phosphatase: 35 U/L — ABNORMAL LOW (ref 38–126)
Anion gap: 18 — ABNORMAL HIGH (ref 5–15)
BUN: 46 mg/dL — ABNORMAL HIGH (ref 8–23)
CO2: 25 mmol/L (ref 22–32)
Calcium: 8.9 mg/dL (ref 8.9–10.3)
Chloride: 95 mmol/L — ABNORMAL LOW (ref 98–111)
Creatinine, Ser: 9.81 mg/dL — ABNORMAL HIGH (ref 0.61–1.24)
GFR, Estimated: 5 mL/min — ABNORMAL LOW (ref >60)
Glucose, Bld: 73 mg/dL (ref 70–99)
Potassium: 5.1 mmol/L (ref 3.5–5.1)
Sodium: 138 mmol/L (ref 135–145)
Total Bilirubin: 1.1 mg/dL (ref 0.3–1.2)
Total Protein: 6.3 g/dL — ABNORMAL LOW (ref 6.5–8.1)

## 2020-11-03 LAB — RPR: RPR Ser Ql: NONREACTIVE

## 2020-11-03 LAB — MAGNESIUM: Magnesium: 3 mg/dL — ABNORMAL HIGH (ref 1.7–2.4)

## 2020-11-03 LAB — FIBRINOGEN: Fibrinogen: 427 mg/dL (ref 210–475)

## 2020-11-03 LAB — D-DIMER, QUANTITATIVE: D-Dimer, Quant: 0.93 ug/mL-FEU — ABNORMAL HIGH (ref 0.00–0.50)

## 2020-11-03 LAB — C-REACTIVE PROTEIN: CRP: 3.7 mg/dL — ABNORMAL HIGH (ref <1.0)

## 2020-11-03 LAB — PROTIME-INR
INR: 1.1 (ref 0.8–1.2)
Prothrombin Time: 13.4 seconds (ref 11.4–15.2)

## 2020-11-03 LAB — FERRITIN: Ferritin: 780 ng/mL — ABNORMAL HIGH (ref 24–336)

## 2020-11-03 LAB — SAVE SMEAR(SSMR), FOR PROVIDER SLIDE REVIEW

## 2020-11-03 LAB — HIV ANTIBODY (ROUTINE TESTING W REFLEX): HIV Screen 4th Generation wRfx: NONREACTIVE

## 2020-11-03 LAB — PHOSPHORUS: Phosphorus: 5.6 mg/dL — ABNORMAL HIGH (ref 2.5–4.6)

## 2020-11-03 LAB — LACTATE DEHYDROGENASE: LDH: 197 U/L — ABNORMAL HIGH (ref 98–192)

## 2020-11-03 MED ORDER — CHLORHEXIDINE GLUCONATE CLOTH 2 % EX PADS: 6.0000 | MEDICATED_PAD | Freq: Every day | CUTANEOUS | Status: DC

## 2020-11-03 MED ORDER — SODIUM CHLORIDE 0.9 % IV SOLN
100.0000 mg | Freq: Every day | INTRAVENOUS | Status: DC
  Administered 2020-11-04: 100 mg via INTRAVENOUS
  Filled 2020-11-03: qty 20

## 2020-11-03 MED ORDER — LACTULOSE 10 GM/15ML PO SOLN
20.0000 g | Freq: Every day | ORAL | Status: DC
  Administered 2020-11-04: 20 g via ORAL
  Filled 2020-11-03 (×4): qty 30

## 2020-11-03 MED ORDER — SENNA 8.6 MG PO TABS
1.0000 | ORAL_TABLET | Freq: Every day | ORAL | Status: DC
  Administered 2020-11-03 – 2020-11-05 (×3): 8.6 mg via ORAL
  Filled 2020-11-03 (×3): qty 1

## 2020-11-03 MED ORDER — HYDRALAZINE HCL 25 MG PO TABS
50.0000 mg | ORAL_TABLET | Freq: Three times a day (TID) | ORAL | Status: DC
  Administered 2020-11-03 – 2020-11-04 (×6): 50 mg via ORAL
  Filled 2020-11-03 (×4): qty 2
  Filled 2020-11-03 (×2): qty 1
  Filled 2020-11-03: qty 2

## 2020-11-03 MED ORDER — SODIUM CHLORIDE 0.9 % IV SOLN
200.0000 mg | Freq: Once | INTRAVENOUS | Status: AC
  Administered 2020-11-03: 200 mg via INTRAVENOUS
  Filled 2020-11-03: qty 40

## 2020-11-03 NOTE — Progress Notes (Addendum)
[facility] Progress Note  Subjective: pt admitted w/ myoclonic jerking, and mild confusion. Was here for similar issues from 12/21 - 10/25/20.     Vitals:   11/03/20 0700 11/03/20 0746 11/03/20 0800 11/03/20 0900  BP: (!) 156/83  (!) 183/90 (!) 166/92  Pulse: 64  70 67  Resp: 16  16 (!) 9  Temp:  98.1 F (36.7 C)    TempSrc:  Oral    SpO2: 96%  98% 98%  Weight:      Height:        Exam:  pt seen in room, a bit confused , ++asterixis   No jvd   Chest clear bilat   Cor reg no RG   Abd soft ntnd no hsm or ascites   Ext trace LE edema     Neuro sig asterixis, forgetful of what day of week it is, knows year and place    RUA AVF +bruit  OP HD: [place] TTS   4h (was 3h 82min prior to last admit)  57.5kg   2/2 bath  450/800   RUA AVF  Hep none  Hect 5 ug   No esa  Assessment/Plan: 1. Expressive aphasia/ myoclonus:Resolved with HD when patient was here 1-2 wks ago. He has hx of good compliance, however HD time was short so we increased treatment time to 4 hours. He is back now w/ similar presentation, sig myoclonic jerking and confusion. He is COVID+ as well this time. Plan HD later this evening. Consider recirculation, not sure why this has returned. Covid pna w/ catabolic state? Creat is about where is usually has been for him since early 2021. He is on neurontin apparently which should be immediately discontinued and never resumed. Valtrex should be held as well , both are common causes of AMS in ESRD pt's.   2. ESRD:TTS HD. Last HD was Thursday, next HD would have been today on the holiday schedule. HD tonight as above.  3. HTN: No volume excess by exam. BP high, resume home meds.  4. 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 Metabolic Bone Disease:hectorol 5 mcg q rx 6. Nutrition: renal diet and vitamins     Rob Sheretha Shadd 11/03/2020, 11:22 AM   Recent Labs  Lab 11/02/20 1439 11/03/20 0535  K 4.7 5.1  BUN 34* 46*  CREATININE 8.92* 9.81*  CALCIUM 9.6 8.9  PHOS  --  5.6*   HGB 11.8* 12.2*   Inpatient medications: . amLODipine  10 mg Oral QHS  . aspirin EC  81 mg Oral Daily  . hydrALAZINE  50 mg Oral Q8H  . metoprolol tartrate  150 mg Oral BID  . senna  1 tablet Oral Daily   . [START ON 11/04/2020] remdesivir 100 mg in NS 100 mL     polyethylene glycol, sodium chloride flush

## 2020-11-03 NOTE — ED Notes (Addendum)
Attempted to give pt oral medications after pt had eaten some ice cream w/ no issues.  He had great difficulty getting down the first pill, coughing a lot.   RN stopped giving the PO medication.  Paged provider to let them know.  Pt is in need of blood pressure medication.  Pt placed on hospital

## 2020-11-03 NOTE — ED Notes (Signed)
Assumed care of patient from triage. Patient brought in by w/c. Patient c/o generalized weakness, expressive aphasia, memory lapses, and "extremity jerking." Patient is T, TH, Sat dialysis patient and is scheduled to receive dialysis tomorrow due to holiday schedule. Last received dialysis on Thursday. Fistula to right arm with + bruit, + thrill. Patient alert and oriented but speaking difficulties noted. Patient tearful during assessment stating "I take all my medications, I go to dialysis, I was vaccinated, why does this keep happening?" Patient reports he was admitted last week for same symptoms. Encouragement offered to patient. Patient resting on stretcher. Cardiac, bp, and pulse ox monitoring in place. Call bell and phone in reach. Will continue to monitor.

## 2020-11-03 NOTE — Progress Notes (Signed)
HD#1 Subjective:  Overnight Events: Not requiring oxygen,   Denies knowing anyone that he was exposed to that has been COVID positive. Patient vaccinated  X 3. Endorses neck pain that onset 2 years ago that went down into his back that has progressed. Unable to elaborate on type of pain. Penile discharge started 2 yrs ago.   Denies missing dialysis recently, but states schedule was "thrown off" during holiday schedule.  Objective:  Vital signs in last 24 hours: Vitals:   11/03/20 0100 11/03/20 0130 11/03/20 0200 11/03/20 0500  BP: (!) 212/91 (!) 176/88 (!) 179/87 (!) 169/86  Pulse: 72 73 68 69  Resp: 16 16 14 18   Temp:      TempSrc:      SpO2: 97% 97% 98% 99%  Weight:      Height:       Supplemental O2: Room Air SpO2: 99 %   Physical Exam:  Physical Exam Constitutional:      General: He is not in acute distress.    Appearance: Normal appearance. He is not toxic-appearing or diaphoretic.  HENT:     Head: Normocephalic and atraumatic.  Cardiovascular:     Rate and Rhythm: Normal rate and regular rhythm.     Pulses: Normal pulses.     Heart sounds: Normal heart sounds. No murmur heard. No friction rub. No gallop.   Pulmonary:     Effort: Pulmonary effort is normal.     Breath sounds: Normal breath sounds. No wheezing, rhonchi or rales.  Abdominal:     General: Abdomen is flat. Bowel sounds are normal.     Palpations: Abdomen is soft.     Tenderness: There is no abdominal tenderness. There is no guarding.  Neurological:     Mental Status: He is alert and oriented to person, place, and time.     Comments: myoclonic jerks in the upper and lower extremities bilaterally  Psychiatric:     Comments: Tearful, AAOx3, able to answer questions appropriately     Filed Weights   11/02/20 1521  Weight: 52.2 kg    No intake or output data in the 24 hours ending 11/03/20 0754 Net IO Since Admission: No IO data has been entered for this period [11/03/20 0754]  Pertinent  Labs: CBC Latest Ref Rng & Units 11/03/2020 11/02/2020 10/23/2020  WBC 4.0 - 10.5 K/uL PENDING 1.9(L) 2.5(L)  Hemoglobin 13.0 - 17.0 g/dL 12.2(L) 11.8(L) 13.2  Hematocrit 39.0 - 52.0 % 38.5(L) 37.0(L) 41.2  Platelets 150 - 400 K/uL 61(L) 63(L) 58(L)    CMP Latest Ref Rng & Units 11/03/2020 11/02/2020 10/23/2020  Glucose 70 - 99 mg/dL 73 81 97  BUN 8 - 23 mg/dL 46(H) 34(H) 16  Creatinine 0.61 - 1.24 mg/dL 9.81(H) 8.92(H) 6.07(H)  Sodium 135 - 145 mmol/L 138 137 138  Potassium 3.5 - 5.1 mmol/L 5.1 4.7 3.9  Chloride 98 - 111 mmol/L 95(L) 95(L) 97(L)  CO2 22 - 32 mmol/L 25 28 27   Calcium 8.9 - 10.3 mg/dL 8.9 9.6 9.2  Total Protein 6.5 - 8.1 g/dL 6.3(L) - -  Total Bilirubin 0.3 - 1.2 mg/dL 1.1 - -  Alkaline Phos 38 - 126 U/L 35(L) - -  AST 15 - 41 U/L 28 - -  ALT 0 - 44 U/L 12 - -    Imaging: CT HEAD WO CONTRAST  Result Date: 11/02/2020 CLINICAL DATA:  Mental status changes EXAM: CT HEAD WITHOUT CONTRAST TECHNIQUE: Contiguous axial images were obtained from the  base of the skull through the vertex without intravenous contrast. COMPARISON:  10/22/2020 FINDINGS: Brain: Old left cerebellar infarct. No acute intracranial abnormality. Specifically, no hemorrhage, hydrocephalus, mass lesion, acute infarction, or significant intracranial injury. Vascular: No hyperdense vessel or unexpected calcification. Skull: No acute calvarial abnormality. Sinuses/Orbits: Opacified right mastoid air cells. This is unchanged since prior study. Other: None IMPRESSION: No acute intracranial abnormality. Right mastoid effusion. Electronically Signed   By: Rolm Baptise M.D.   On: 11/02/2020 17:24   DG CHEST PORT 1 VIEW  Result Date: 11/03/2020 CLINICAL DATA:  Weakness and COVID-19 EXAM: PORTABLE CHEST 1 VIEW COMPARISON:  None. FINDINGS: Hazy opacities at the right greater than left lung bases. No pleural effusion or pneumothorax. Normal cardiomediastinal contours. IMPRESSION: Right-greater-than-left basilar opacity, likely  infection. Electronically Signed   By: Ulyses Jarred M.D.   On: 11/03/2020 04:55    Assessment/Plan:   Active Problems:   Encephalopathy   Patient Summary: Maurice Bell is 65 year-old male with ESRD on TTS HD schedule, resistant hypertension, hx CVA, hx STEMI 11/2019, previous hepatitis B and hepatitis C infections admitted 11/02/20 for acute metabolic encephalopathy most likely with multi-factorial etiology including acute severe symptomatic hypertension and COVID-19 infection..   Acute Metabolic Encephalopathy Myoclonus Acute Severe Symptomatic Hypertension COVID-19 Infection ESRD on HD:  Patient presented with similar presentation on 10/22/20 which resolved with dialysis. He does likely have multifactorial etiologies for his presentation including medications, elevated pressures, COVID 19 infection. 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   - Holding centrally acting medications - Hold gabapentin as patient is ESRD with myoclonic jerking. Ensure to discontinue at discharge.  - DC Valtrex and oxycodone.  - Trial Lactulose 20 mg solution once daily - Continue home antihypertensives:             - Amlodipine 16m QHS             - Hydralazine 523mTID             - Metoprolol tartrate 15045mID - Nephrology planning on HD tonight, which hopefully will assist in decreasing his BP.  - Continue Remdesivir day 1/5 - Continue trending inflammatory markers - Daily CBC, BMP - Mg phos both elevated in the setting of ESRD, dialysis tonight.  - Flutter valve, incentive spirometry.  - Will need PT/OT eval.   Pancytopenia:  Pancytopenia worsened since last admission, likely in the setting of newfound COVID 19 infection. LDH slightly elevated, pathologist smear pending, MMA pending. - Pathologist smear review pending - MMA pending - LDH 197 - Can consider bone marrow biopsy  Purulent urethral  discharge Hx bloody urethral discharge with bowel movement - HIV: non reactive - GC/chlamydia: Not collected  - RPR: nonreactive - F/u outpatient urology  Chronic constipation Will continue home bowel regimen. - Home senokot daily - Miralax PRN  Hx hepatitis C Hx hepatitis B - HCV RNA: In process   Diet: Carb-Modified IVF: None,None VTE: SCDs Code: Full    Dispo: Anticipated discharge to Home in 2 days pending further medical workup.   SteMaudie Mercury Internal Medicine Resident PGY-2 Pager [phone] contact the on call pager after 5 pm and on weekends at [phone]
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
## 2020-11-03 NOTE — H&P (Addendum)
NAME:  Maurice Bell, MRN:  [id number], DOB:  [birth year], LOS: 0 ADMISSION DATE:  11/02/2020, Primary: Patient, No Pcp Per  CHIEF COMPLAINT:  Brain fog, weakness  Medical Service: Internal Medicine Teaching Service         Attending Physician: Dr. Evette Doffing, Mallie Mussel, *    First Contact: Dr. Shon Baton Pager: [phone]  Second Contact: Dr. Gilford Rile Pager: [phone]       After Hours (After 5p/  First Contact Pager: [phone]  weekends / holidays): Second Contact Pager: [place]  [name] is 65 year-old male with ESRD on TTS HD schedule, resistant hypertension, hx CVA, hx STEMI 11/2019 (cath without apparent CAD), previous hepatitis B and hepatitis C infections who presents to [facility] via EMS for memory loss, generalized weakness, and difficulty speaking. Specific details regarding history difficult to obtain give patient's presenting symptoms. He was previously admitted on 10/23/2020 for similar presentation and thought to be 2/2 uremic encephalopathy. Since discharge, patient reports attending dialysis with his regular schedule, last session being Thursday, 10/31/20.  Patient was supposed to have dialysis session 11/02/20, but was unable given the holiday and was scheduled to have session on 11/03/20. He dialyzes at [facility].  Patient states he woke up this morning with worsening neurologic symptoms. He says he is having trouble "thinking in general" Patient has difficulty characterizing these symptoms specifically. He states he just feels "weak all over" and does not feel like himself. Patient also reports intermittent jerks that have been occurring for "awhile." He said he has had similar episdoes in the past, including last week. Stated dialysis helped decrease some of his symptoms. Mentions he has experienced decreased appetite over the last few days as well. He does endorse chronic constipation, last BM a few days ago. Denies fevers, chills, headache, blurry vision,  chest pain, dyspnea, nausea, vomiting, abdominal pain, diarrhea, leg swelling. He states he previously had a syncopal episode but cannot remember when. He remembers standing up, walking, and then blacking out. Mentions he felt confused after the event, cannot recall if he had a tongue injury and he does not urinate at baseline due to kidney disease. Denies known family or personal history of seizures. He also mentions he had previous stroke years ago with residual bilateral lower extremity weakness. He also mentions chronic numbness in 4th and 5th digits of left hand. He has been vaccinated x3 for COVID-19.  Maurice Bell does mention that he has chronic constipation. He notes that he has bloody penile discharge when bearing down. During these times he experiences pain at the tip of his penis. He says he was previously saw MD for this problem and was told to go to urologist, but has not been able to go. He states he is not currently sexually active.   Given patient's memory problems, discussed patient's symptoms with his son, Maurice Bell, who lives with patient. He says patient has had a few similar episodes over the last year. During these times, he becomes very weak, has difficulty speaking, and has balance issues. Notes Maurice Bell takes all of his medications as prescribed and completes all of his dialysis sessions. Denies missing any dialysis sessions. He is unaware of any sick contacts.   PCP: Patient, No Pcp Per  ED COURSE  Upon arrival to ED, patient afebrile, hemodynamically stable. While in ED, patient became hypertensive, SBP 190-220. BP refractory to multiple medications. IMTS consulted for admission for myriad of neurological symptoms without obvious cause and  uncontrolled hypertension.   PAST MEDICAL HISTORY  He,  has a past medical history of Anemia, Anxiety, Arthritis, Depression, Dyspnea, ESRD (end stage renal disease) on dialysis ([place]), GERD (gastroesophageal reflux disease),  Hepatitis C, History of anemia due to CKD, Hypertension, Myoclonic jerking, Pneumonia, Renal disorder, Stroke [facility]), Wears glasses, and Wears partial dentures.   HOME MEDICATIONS   Prior to Admission medications   Medication Sig Start Date End Date Taking? Authorizing Provider  acetaminophen (TYLENOL 8 HOUR ARTHRITIS PAIN) 650 MG CR tablet Take 1,300 mg by mouth every 8 (eight) hours as needed for pain.    [provider]  amLODipine (NORVASC) 10 MG tablet Take 1 tablet (10 mg total) by mouth at bedtime. 05/03/18   [name], PA-C  amLODipine (NORVASC) 10 MG tablet Take 10 mg by mouth daily.    [provider]  aspirin EC 81 MG tablet Take 81 mg by mouth daily.    [provider]  B Complex-C-Folic Acid (NEPHRO-VITE PO) Take 0.8 mg by mouth.    [provider]  gabapentin (NEURONTIN) 100 MG capsule Take 100 mg by mouth 3 (three) times daily as needed (for neuropathic pain).    [provider]  hydrALAZINE (APRESOLINE) 50 MG tablet Take 50 mg by mouth 2 (two) times a day.  12/27/18   [provider]  hydrALAZINE (APRESOLINE) 50 MG tablet Take 50 mg by mouth 3 (three) times daily.    [provider]  Menthol-Methyl Salicylate (ICY HOT) 19-37 % STCK Apply 1 application topically as needed (joint pain).    [provider]  metoprolol tartrate (LOPRESSOR) 50 MG tablet Take 150 mg by mouth 2 (two) times daily. 12/13/18   [provider]  metoprolol tartrate (LOPRESSOR) 50 MG tablet Take 150 mg by mouth 2 (two) times daily.    [provider]  multivitamin (RENA-VIT) TABS tablet Take 1 tablet by mouth at bedtime. 09/11/18   [name], [name], MD  NEURONTIN 100 MG capsule Take 100 mg by mouth 3 (three) times daily. 06/29/20   [provider]  nicotine (NICODERM CQ - DOSED IN MG/24 HOURS) 14 mg/24hr patch Apply 79m patch one daily for 6 weeks, then change to 148mpatch. Remove old patch before applying new  one. 12/13/19   LeImogene BurnPA-C  nicotine (NICODERM CQ - DOSED IN MG/24 HR) 7 mg/24hr patch Apply [id number] one daily for 2 weeks. Remove old patch before applying new one. 12/13/19   LenImogene BurnA-C  oxyCODONE-acetaminophen (PERCOCET/ROXICET) 5-325 MG tablet Take 1 tablet by mouth every 8 (eight) hours as needed for severe pain. 06/14/19   YatMarybelle KillingsD  oxyCODONE-acetaminophen (PERCOCET/ROXICET) 5-325 MG tablet Take 1 tablet by mouth every 4 (four) hours as needed for severe pain.    [provider]  senna (SENOKOT) 8.6 MG TABS tablet Take 1 tablet (8.6 mg total) by mouth daily as needed for mild constipation. 10/25/20   WatAlexandria LodgeD  sevelamer carbonate (RENVELA) 800 MG tablet Take 4 tablets (3,200 mg total) by mouth 3 (three) times daily with meals. 09/11/18   [name], RipVernelle EmeraldD  sevelamer carbonate (RENVELA) 800 MG tablet Take 3,200 mg by mouth 3 (three) times daily with meals.    [provider]  sildenafil (VIAGRA) 50 MG tablet Take 50 mg by mouth daily as needed for erectile dysfunction.    [provider]  sodium zirconium cyclosilicate (LOKELMA) 10 g PACK packet Take 10 g by  mouth every Monday.     [provider]  Sofosbuvir-Velpatasvir (EPCLUSA) 400-100 MG TABS Take 1 tablet by mouth daily. 01/10/20   Kuppelweiser, Cassie L, RPH-CPP  valACYclovir (VALTREX) 500 MG tablet Take 1 tablet (500 mg total) by mouth daily. 05/29/19   [name], NP    ALLERGIES   Allergies as of 11/02/2020 - Review Complete 10/22/2020  Allergen Reaction Noted  . Vicodin [hydrocodone-acetaminophen] Itching 04/26/2019   SOCIAL HISTORY  Mr. Gains lives with his son, Maurice Bell, in [place]. He states he has intermittently smoked cigarettes "for awhile." Currently smokes 3-4 cigarettes daily. Denies alcohol or illicit drug use.   FAMILY HISTORY  His family history includes Diabetes in his father and mother; High blood pressure in an other family  member; Hypertension in his father, mother, and son.   REVIEW OF SYSTEMS  ROS per history of present illness.  PHYSICAL EXAMINATION  Blood pressure (!) 218/96, pulse 71, temperature 99.1 F (37.3 C), temperature source Oral, resp. rate 12, height 5' 5"  (1.651 m), weight 52.2 kg, SpO2 97 %.    Filed Weights   11/02/20 1521  Weight: 52.2 kg   GENERAL: Chronically ill-appearing, appears tired, no acute distress, acyanotic. HENT: Normocephalic, atraumatic. No tongue lacerations.  EYES: Vision grossly in tact. No conjunctival pallor. Pupils equal, round, reactive. CV: Regular rate, rhythm. Systolic murmur appreciated. Distal pulses 2+ bilaterally. PULM: Breathing comfortably on room air. No use of accessory muscles. No wheezing, rales, rhonchi. ABD: Midline lower abdominal scar. Soft, non-tender, non-distended. No hepatosplenomegaly. Normoactive bowel sounds. GU:  Purulent urethral, no blood visible. No vesicles, rashes on head/shaft of penis or pubic area. No penile plaques or masses appreciated. Few small hyperpigmented macules on head of penis. MSK: Normal bulk, tone. No pitting edema bilaterally. No fasciculations appreciated. SKIN: No ulcerations or wounds appreciated. Dry, scaly skin on feet bilaterally. Graft on right upper arm with palpable thrill. No bruises appreciated. NEURO: Awake, alert, oriented x4. Slow/delayed response to commands/questions. Cranial nerves in tact. Decreased sensation fourth and fifth digits left hand, otherwise sensation in tact. Upper extremities 5/5 bilaterally.  Bilateral lower extremities 3/5 strength.  Intermittent myoclonic jerking of all extremities.Negative Babinski. No aphasia, dysarthria, asterixis. PSYCH: Normal mood, behavior, affect.  SIGNIFICANT DIAGNOSTIC TESTS  ECG: Normal sinus rhythm. Left axis deviation. Non-specific ST changes in pre-cordial leads, similar to previous study.  I personally reviewed patient's ECG with my interpretation as  above.  CT head: No acute intracranial abnormality. Right mastoid effusion.  LABS   CBC Latest Ref Rng & Units 11/02/2020 10/23/2020 10/23/2020  WBC 4.0 - 10.5 K/uL 1.9(L) 2.5(L) 2.3(L)  Hemoglobin 13.0 - 17.0 g/dL 11.8(L) 13.2 12.6(L)  Hematocrit 39.0 - 52.0 % 37.0(L) 41.2 38.4(L)  Platelets 150 - 400 K/uL 63(L) 58(L) 73(L)   BMP Latest Ref Rng & Units 11/02/2020 10/23/2020 10/23/2020  Glucose 70 - 99 mg/dL 81 97 83  BUN 8 - 23 mg/dL 34(H) 16 53(H)  Creatinine 0.61 - 1.24 mg/dL 8.92(H) 6.07(H) 13.11(H)  BUN/Creat Ratio 10 - 24 - - -  Sodium 135 - 145 mmol/L 137 138 139  Potassium 3.5 - 5.1 mmol/L 4.7 3.9 5.0  Chloride 98 - 111 mmol/L 95(L) 97(L) 96(L)  CO2 22 - 32 mmol/L 28 27 25   Calcium 8.9 - 10.3 mg/dL 9.6 9.2 9.2   SARS Coronavirus-2 PCR: POSITIVE  CONSULTS  nephrology  ASSESSMENT  [name] is 65 year-old male with ESRD on TTS HD schedule, resistant hypertension, hx CVA, hx STEMI  11/2019, previous hepatitis B and hepatitis C infections admitted 11/02/20 for acute metabolic encephalopathy most likely with multi-factorial etiology including acute severe symptomatic hypertension and COVID-19 infection.  PLAN  Active Problems:   Encephalopathy  #Acute metabolic encephalopathy #Acute severe symptomatic hypertension #COVID-19 infection #ESRD on HD Patient presents with acute change in mental status over the last few days, including slowed responses, difficulty speaking, and worsening of memory. Patient had similar presentation during last hospitalization a few weeks ago. Since discharge, he reports compliance with medications and no missed dialysis sessions, with last session on 10/31/20. On physical exam, he is afebrile, sating well on room air, and severely hypertensive with systolic blood pressures elevated to 190-220. Initial labs included pancytopenia and elevated BUN 34. He was found to be COVID-19 positive while in the ED. Patient has multiple possible etiologies  for his acute encephalopathy, including severe hypertension and COVID-19. In addition, he has an elevated BUN of 34, although unlikely he is experiencing uremic encephalopathy as this is only mildly elevated. Patient also has history of hepatitis B and C infections. During last hospitalization, RUQ ultrasound revealed hepatic steatosis with normal LFT's, therefore hepatic encephalopathy is unlikely. He also has a history of CVA, neurologic exam today is not consistent with acute CVA. MRI during last hospitalization for similar presentation negative. During interview, he also mentions remote history of syncopal episode followed by a state of confusion. No known history of seizures. Per patient, myoclonic jerks are chronic. Most likely presentation multi-factorial due to hypertension and COVID-19 infection, therefore will focus on controlling hypertension and treating COVID-19. If symptoms do not improve with blood pressure control, can consider neurology consult and further work-up of other neurologic disorder. Will start Remdesivir given patient's co-morbidities and leukopenia, although he is sating well on RA. Holding steroids. He has been vaccinated x3 for COVID-19. ED provider discussed with nephrology, who plans on seeing patient for HD tomorrow. Will also continue patient's home antihypertensives, holding centrally-acting medications. - Hold centrally acting medications - Continue home antihypertensives:  -Amlodipine 53m QHS  -Hydralazine 54mTID  -Metoprolol tartrate 15045mID - Nephrology planning on HD tomorrow (EDW 57.5kg) - Start Remdesivir (day 1/5) - Trend inflammatory markers - Daily CBC, BMP - F/u AM Mg, phos - Flutter valve, incentive spirometry - SLP eval - Will need PT/OT eval  #Pancytopenia Per chart review, WBC and platelets have been steadily declining over the last year. During previous hospitalization, patient found to be pancytopenic. Work-up at that time included RUQ  ultrasound showing hepatic steatosis, normal INR/PT, mildly elevated aPTT, normal LFT's. Vitamin B12, folate both normal.  Reticulocyte index 0.8 at that time, smear with rare giant platelets otherwise unremarkable. Denies alcohol use. Unclear if patient took Epclusa, but appears cell lines started to drop prior to initiation of medication.  During his initial lab work on this admission, revealed worsening pancytopenia with WBC 1.9, Hgb 11.8, PLT 63. No splenomegaly on exam. Multiple possible etiologies for pancytopenia including infectious v auto-immune v primary bone marrow disorder v malignancy. Will check HIV, ferritin. Can consider HCV RNA as well if patient did not complete antiviral treatment. Pending work-up can also consider bone marrow biopsy and further evaluation by Heme/Onc in outpatient setting. - F/u CMP, HIV, ferritin - Can consider bone marrow biopsy  #Purulent urethral discharge #Hx bloody urethral discharge with bowel movement Patient found to have purulent urethral discharge on physical examination. He denies current sexual activity. HIV negative in February 2021, RPR negative in 2019. Will check STI  panel. In addition, he reports chronic history of bloody urethral discharge with bowel movement. Patient had been recommended to follow up with outpatient urology for further evaluation. - F/u HIV, GC/chlamydia, RPR - F/u outpatient urology  #Chronic constipation Patient reports history of constipation 2/2 chronic opioid use. States he previously had a bowel movement a few days ago, although states he has not eaten much over the last day or two. Will continue home bowel regimen. - Home senokot daily - Miralax PRN  #Hx hepatitis C #Hx hepatitis B Per chart review, history of hepatitis C and hepatitis B. Found to have high HCV RNA level in February 2021. Medication list shows Epclusa, but unclear if patient completed treatment as it appears he was lost to follow-up. Previously found to  have positive HBcAb and HBsAb in July 2020. HBsAg negative during last hospitalization. At that time, patient had normal albumin, Tbili, AST/ALT, PT, INR. Can consider repeat HCV RNA to ensure treatment/clearance of infection. - F/u HFP - Consider HCV RNA  BEST PRACTICE  DIET: NPO pending SLP eval IVF: n/a DVT PPX: SCDs - holding pharmacologic DVT prophylaxis in setting of acute severe symptomatic hypertension, can consider starting when BP controlled & PLT >50 BOWEL: Senokot, Miralax CODE: FULL FAM COM: Patient mentions he lives with his son, Maurice Bell, who is his primary contact. Discussed with Maurice Bell that Maurice Bell is COVID-19 positive and encouraged him to test and quarantine. Also discussed work-up of Maurice Bell's presentation. Maurice Bell wishes to be updated tomorrow on patient's condition.  DISPO: Admit patient to Inpatient with expected length of stay greater than 2 midnights.  Sanjuan Dame, MD Internal Medicine Resident PGY-1 PAGER: [phone] 11/02/20 7:51 PM  If after hours (below), please contact on-call pager: [phone] 5PM-7AM Monday-Friday 1PM-7AM Saturday-Sunday

## 2020-11-04 DIAGNOSIS — K5909 Other constipation: Secondary | ICD-10-CM

## 2020-11-04 DIAGNOSIS — G253 Myoclonus: Secondary | ICD-10-CM | POA: Diagnosis not present

## 2020-11-04 DIAGNOSIS — U071 COVID-19: Secondary | ICD-10-CM | POA: Diagnosis not present

## 2020-11-04 DIAGNOSIS — I120 Hypertensive chronic kidney disease with stage 5 chronic kidney disease or end stage renal disease: Secondary | ICD-10-CM

## 2020-11-04 DIAGNOSIS — N186 End stage renal disease: Secondary | ICD-10-CM

## 2020-11-04 DIAGNOSIS — G9341 Metabolic encephalopathy: Secondary | ICD-10-CM | POA: Diagnosis not present

## 2020-11-04 DIAGNOSIS — Z992 Dependence on renal dialysis: Secondary | ICD-10-CM

## 2020-11-04 LAB — CBC WITH DIFFERENTIAL/PLATELET
Abs Immature Granulocytes: 0 10*3/uL (ref 0.00–0.07)
Basophils Absolute: 0 10*3/uL (ref 0.0–0.1)
Basophils Relative: 1 %
Eosinophils Absolute: 0 10*3/uL (ref 0.0–0.5)
Eosinophils Relative: 1 %
HCT: 44.1 % (ref 39.0–52.0)
Hemoglobin: 14.8 g/dL (ref 13.0–17.0)
Immature Granulocytes: 0 %
Lymphocytes Relative: 38 %
Lymphs Abs: 0.6 10*3/uL — ABNORMAL LOW (ref 0.7–4.0)
MCH: 35.2 pg — ABNORMAL HIGH (ref 26.0–34.0)
MCHC: 33.6 g/dL (ref 30.0–36.0)
MCV: 105 fL — ABNORMAL HIGH (ref 80.0–100.0)
Monocytes Absolute: 0.3 10*3/uL (ref 0.1–1.0)
Monocytes Relative: 25 %
Neutro Abs: 0.5 10*3/uL — ABNORMAL LOW (ref 1.7–7.7)
Neutrophils Relative %: 35 %
Platelets: 68 10*3/uL — ABNORMAL LOW (ref 150–400)
RBC: 4.2 MIL/uL — ABNORMAL LOW (ref 4.22–5.81)
RDW: 13.2 % (ref 11.5–15.5)
WBC: 1.4 10*3/uL — CL (ref 4.0–10.5)
nRBC: 0 % (ref 0.0–0.2)

## 2020-11-04 LAB — COMPREHENSIVE METABOLIC PANEL
ALT: 13 U/L (ref 0–44)
AST: 27 U/L (ref 15–41)
Albumin: 3.4 g/dL — ABNORMAL LOW (ref 3.5–5.0)
Alkaline Phosphatase: 35 U/L — ABNORMAL LOW (ref 38–126)
Anion gap: 14 (ref 5–15)
BUN: 20 mg/dL (ref 8–23)
CO2: 25 mmol/L (ref 22–32)
Calcium: 8.3 mg/dL — ABNORMAL LOW (ref 8.9–10.3)
Chloride: 98 mmol/L (ref 98–111)
Creatinine, Ser: 5.37 mg/dL — ABNORMAL HIGH (ref 0.61–1.24)
GFR, Estimated: 11 mL/min — ABNORMAL LOW (ref >60)
Glucose, Bld: 74 mg/dL (ref 70–99)
Potassium: 3.9 mmol/L (ref 3.5–5.1)
Sodium: 137 mmol/L (ref 135–145)
Total Bilirubin: 0.9 mg/dL (ref 0.3–1.2)
Total Protein: 6.6 g/dL (ref 6.5–8.1)

## 2020-11-04 LAB — PHOSPHORUS: Phosphorus: 3.1 mg/dL (ref 2.5–4.6)

## 2020-11-04 LAB — FERRITIN: Ferritin: 1129 ng/mL — ABNORMAL HIGH (ref 24–336)

## 2020-11-04 LAB — PATHOLOGIST SMEAR REVIEW

## 2020-11-04 LAB — C-REACTIVE PROTEIN: CRP: 2.4 mg/dL — ABNORMAL HIGH (ref <1.0)

## 2020-11-04 LAB — D-DIMER, QUANTITATIVE: D-Dimer, Quant: 4.69 ug/mL-FEU — ABNORMAL HIGH (ref 0.00–0.50)

## 2020-11-04 LAB — MAGNESIUM: Magnesium: 2.4 mg/dL (ref 1.7–2.4)

## 2020-11-04 MED ORDER — HYDRALAZINE HCL 20 MG/ML IJ SOLN: 5.0000 mg | Freq: Once | INTRAMUSCULAR | Status: DC | PRN

## 2020-11-04 MED ORDER — OXYCODONE-ACETAMINOPHEN 5-325 MG PO TABS
1.0000 | ORAL_TABLET | Freq: Two times a day (BID) | ORAL | Status: DC | PRN
  Administered 2020-11-04 – 2020-11-05 (×2): 1 via ORAL
  Filled 2020-11-04 (×2): qty 1

## 2020-11-04 MED ORDER — OXYCODONE-ACETAMINOPHEN 5-325 MG PO TABS
1.0000 | ORAL_TABLET | Freq: Once | ORAL | Status: AC | PRN
  Administered 2020-11-04: 1 via ORAL
  Filled 2020-11-04: qty 1

## 2020-11-04 MED ORDER — HEPARIN SODIUM (PORCINE) 5000 UNIT/ML IJ SOLN
5000.0000 [IU] | Freq: Three times a day (TID) | INTRAMUSCULAR | Status: DC
  Administered 2020-11-04 – 2020-11-05 (×2): 5000 [IU] via SUBCUTANEOUS
  Filled 2020-11-04 (×2): qty 1

## 2020-11-04 NOTE — Progress Notes (Signed)
HD#2 Subjective:   No acute events overnight. Patient taken for dialysis ~330 am. Prior to dialysis had some difficulty taking his PO meds.  During rounds this morning, the patient is eating breakfast, states he is feeling better after dialysis in that his thinking is clearer and his shaking is improved but does not feel back to his baseline. States he is frustrated that he is back in the hospital. Denies shortness of breath, endorses mild cough. States he has not taken gabapentin in >1 month, takes valacyclovir for genital herpes which he hasn't had an outbreak "in a while." Endorses chronic low back pain and requests his home pain medications.  Later went back to bedside to obtain urethral swab for GC/chlamydia testing. Patient states he has had discharge off and on for the last 1-2 months. Denies recent new sexual partners. Cannot recall the last time he was tested for STIs.  Objective:  Vital signs in last 24 hours: Vitals:   11/04/20 0536 11/04/20 0551 11/04/20 0606 11/04/20 0621  BP: 137/77 134/86 128/78 121/82  Pulse: 62 64 60 60  Resp: 13 14 14  (!) 21  Temp:      TempSrc:      SpO2: 97% 98% 97% 99%  Weight:      Height:       Supplemental O2: Room Air SpO2: 99 %  Physical Exam:  Physical Exam Constitutional:      General: He is not in acute distress.    Appearance: Normal appearance. He is not toxic-appearing or diaphoretic.  HENT:     Head: Normocephalic and atraumatic.     Nose: No congestion.     Mouth/Throat:     Mouth: Mucous membranes are moist.  Cardiovascular:     Rate and Rhythm: Normal rate and regular rhythm.     Pulses: Normal pulses.     Heart sounds: Normal heart sounds. No murmur heard. No friction rub. No gallop.   Pulmonary:     Effort: Pulmonary effort is normal.     Breath sounds: Normal breath sounds. No wheezing, rhonchi or rales.  Abdominal:     General: Abdomen is flat. Bowel sounds are normal.     Palpations: Abdomen is soft.      Tenderness: There is no abdominal tenderness. There is no guarding.  Genitourinary:    Penis: Normal.      Comments: Dried dark brown discharge in boxer briefs; no discharge at the penis/urethra Skin:    General: Skin is warm and dry.  Neurological:     Mental Status: He is alert and oriented to person, place, and time.     Comments: myoclonic jerks in the head and upper extremities bilaterally; able to state the months of the year backwards from December through October then states, "I can't do the rest, I don't know."    Pertinent Labs: CBC Latest Ref Rng & Units 11/03/2020 11/02/2020 10/23/2020  WBC 4.0 - 10.5 K/uL 1.2(LL) 1.9(L) 2.5(L)  Hemoglobin 13.0 - 17.0 g/dL 12.2(L) 11.8(L) 13.2  Hematocrit 39.0 - 52.0 % 38.5(L) 37.0(L) 41.2  Platelets 150 - 400 K/uL 61(L) 63(L) 58(L)    CMP Latest Ref Rng & Units 11/03/2020 11/02/2020 10/23/2020  Glucose 70 - 99 mg/dL 73 81 97  BUN 8 - 23 mg/dL 46(H) 34(H) 16  Creatinine 0.61 - 1.24 mg/dL 9.81(H) 8.92(H) 6.07(H)  Sodium 135 - 145 mmol/L 138 137 138  Potassium 3.5 - 5.1 mmol/L 5.1 4.7 3.9  Chloride 98 - 111 mmol/L  95(L) 95(L) 97(L)  CO2 22 - 32 mmol/L 25 28 27   Calcium 8.9 - 10.3 mg/dL 8.9 9.6 9.2  Total Protein 6.5 - 8.1 g/dL 6.3(L) - -  Total Bilirubin 0.3 - 1.2 mg/dL 1.1 - -  Alkaline Phos 38 - 126 U/L 35(L) - -  AST 15 - 41 U/L 28 - -  ALT 0 - 44 U/L 12 - -    Assessment/Plan:   Principal Problem:   Encephalopathy Active Problems:   ESRD (end stage renal disease) (HCC)   Severe hypertension   Myoclonic jerking   Pancytopenia ([place])   COVID-19   Patient Summary: [name] is 65 year-old male with ESRD on TTS HD schedule, resistant hypertension, hx CVA, hx STEMI 11/2019, previous hepatitis B and hepatitis C infections admitted 11/02/20 for acute metabolic encephalopathy most likely with multi-factorial etiology including gabapentin toxcity, acute severe symptomatic hypertension and COVID-19 infection.  This is hospital day  2.  Acute Metabolic Encephalopathy Myoclonus Acute Severe Symptomatic Hypertension COVID-19 Infection ESRD on HD:  During evaluation after dialysis this morning, patient with improvement in his myoclonus and cognition, speech is less slowed. Continues to be alert and oriented x 3. Denies shortness of breath, endorses mild cough. BP improved after dialysis. D-dimer/ferritin/CRP 4.69/1129/2.4 from 0.93/780/3.7, suspect due to COVID infxn, will discontinue remdesivir as patient has minimal symptoms. High suspicion for gabapentin toxicity as main driver of his presentation, though elevated BP and COVID 19 infection could also be contributing. - Hold gabapentin as patient is ESRD with myoclonic jerking. Ensure to discontinue at discharge.  - Due for next HD on Wed 1/5 - Continue to hold Valtrex, though patient is at appropriate dosing for dialysis, likely resume at discharge - Will treat conservatively with home oxycodone-acetaminophen at reduced frequency of 5-325 q12h PRN - Trial Lactulose 20 mg solution once daily - Continue home antihypertensives:             - Amlodipine 91m QHS             - Hydralazine 585mTID             - Metoprolol tartrate 15081mID - s/p 2 days of remdesivir - Continue trending inflammatory markers - PT/OT eval  Pancytopenia:  Pancytopenia worsened since last admission, likely in the setting of newfound COVID 19 infection, stable today with improvement in hemoglobin from 12.2 to 14.8 and platelets 61k to 68k. WBC 1.4 from 1.2. LDH slightly elevated. Pathologist smear shows pancytopenia with macrocytosis, MMA pending. - MMA pending - LDH 197 - Can consider bone marrow biopsy on outpatient basis  Purulent urethral discharge Hx bloody urethral discharge with bowel movement Urethral swab collected at bedside today. Patient states discharge present for the last 1-2 months. Denies new sexual partners but cannot recall last time he was tested for STIs. - HIV: non  reactive - GC/chlamydia: pending - RPR: nonreactive - F/u outpatient urology  Chronic constipation Will continue home bowel regimen. - Home senokot daily - Miralax PRN  Hx hepatitis C Hx hepatitis B - HCV RNA: In process   Diet: Carb-Modified IVF: None VTE: Heparin Code: Full    Dispo: Anticipated discharge to Home in 1-2 days pending further medical workup.   JulAlexandria Lodgeternal Medicine Resident PGY-1 Pager [phone] contact the on call pager after 5 pm and on weekends at [phone]

## 2020-11-04 NOTE — ED Notes (Signed)
Pt had two really large bowel movements.

## 2020-11-04 NOTE — ED Notes (Signed)
Spoke to provider concerning swallowing and meds.  If bp goes over 220, there will be a one time order for IV meds.

## 2020-11-04 NOTE — Progress Notes (Signed)
[facility] Progress Note  Subjective:   HD overnight  Still with myoclonus and some aphasia  K and BUN are ok, uremia not going on here  COVID19 positive, ASx, not on directed therapies  Vitals:   11/04/20 1400 11/04/20 1430 11/04/20 1445 11/04/20 1500  BP: (!) 165/79   (!) 149/92  Pulse:  65  66  Resp: 14 14  15   Temp:   98 F (36.7 C)   TempSrc:   Oral   SpO2:  100%  95%  Weight:      Height:        Exam: +myoclonus RRR CTAB RUE AVF +BT S/nt/nd No LEE   OP HD: [facility]   4h (was 3h 60min prior to last admit)  57.5kg   2/2 bath  450/800   RUA AVF  Hep none  Hect 5 ug   No esa  Assessment/Plan: 1. Expressive aphasia/ myoclonus:Resolved with HD when patient was here 1-2 wks ago. He has hx of good compliance, however HD time was short so we increased treatment time to 4 hours. By no objective measure is he uremic, BUN is < 50 and his Kt/V are at target, he is adherent to Tx.  Hold all gabapentin, pregabalin.  Might need neuro eval.  2. COVID19, ASx.  3. ESRD:TTS HD. HD tomorrow on schedule: 3.5h, 2K, 1-2L UF, AVF, noheparin 4. HTN: No volume excess by exam. BP high, resume home meds.  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 Nutrition: renal diet and vitamins  [name]  11/04/2020, 3:46 PM   Recent Labs  Lab 11/03/20 0535 11/04/20 0942  K 5.1 3.9  BUN 46* 20  CREATININE 9.81* 5.37*  CALCIUM 8.9 8.3*  PHOS 5.6* 3.1  HGB 12.2* 14.8   Inpatient medications: . amLODipine  10 mg Oral QHS  . aspirin EC  81 mg Oral Daily  . Chlorhexidine Gluconate Cloth  6 each Topical Q0600  . heparin  5,000 Units Subcutaneous Q8H  . hydrALAZINE  50 mg Oral Q8H  . lactulose  20 g Oral Daily  . metoprolol tartrate  150 mg Oral BID  . senna  1 tablet Oral Daily    hydrALAZINE, [START ON 11/05/2020] oxyCODONE-acetaminophen, polyethylene glycol, sodium chloride flush

## 2020-11-04 NOTE — Progress Notes (Signed)
Renal Navigator notes patient's positive COVID test result and informed patient's outpatient clinic/[facility]. He will need to receive HD in isolation for 21 days from 11/02/20 or until he receives negative testing and Medical Director's approval to return to negative shift.  [facility] runs their COVID isolation shift on MWF at 5:30pm and asks patients to call with arrival to the parking lot and to remain in their vehicles until staff instruct to come in.  Navigator will follow closely for dispo planning.  Alphonzo Cruise, [place] Renal Navigator [phone]

## 2020-11-04 NOTE — ED Notes (Signed)
RN gave report to dialysis RN.  Pt transported to [facility] for dialysis.

## 2020-11-04 NOTE — ED Notes (Signed)
Urinalysis has not been collected, because pt states he no longer makes urine.

## 2020-11-04 NOTE — ED Notes (Signed)
Pt had a medium bowel movement.

## 2020-11-04 NOTE — ED Notes (Signed)
Lunch Tray Ordered @ 1045 

## 2020-11-05 DIAGNOSIS — N186 End stage renal disease: Secondary | ICD-10-CM | POA: Diagnosis not present

## 2020-11-05 DIAGNOSIS — U071 COVID-19: Secondary | ICD-10-CM | POA: Diagnosis not present

## 2020-11-05 DIAGNOSIS — G253 Myoclonus: Secondary | ICD-10-CM | POA: Diagnosis not present

## 2020-11-05 DIAGNOSIS — G9341 Metabolic encephalopathy: Secondary | ICD-10-CM | POA: Diagnosis not present

## 2020-11-05 LAB — COMPREHENSIVE METABOLIC PANEL
ALT: 15 U/L (ref 0–44)
AST: 32 U/L (ref 15–41)
Albumin: 2.9 g/dL — ABNORMAL LOW (ref 3.5–5.0)
Alkaline Phosphatase: 33 U/L — ABNORMAL LOW (ref 38–126)
Anion gap: 12 (ref 5–15)
BUN: 33 mg/dL — ABNORMAL HIGH (ref 8–23)
CO2: 25 mmol/L (ref 22–32)
Calcium: 7.8 mg/dL — ABNORMAL LOW (ref 8.9–10.3)
Chloride: 100 mmol/L (ref 98–111)
Creatinine, Ser: 7.06 mg/dL — ABNORMAL HIGH (ref 0.61–1.24)
GFR, Estimated: 8 mL/min — ABNORMAL LOW (ref >60)
Glucose, Bld: 77 mg/dL (ref 70–99)
Potassium: 3.9 mmol/L (ref 3.5–5.1)
Sodium: 137 mmol/L (ref 135–145)
Total Bilirubin: 0.3 mg/dL (ref 0.3–1.2)
Total Protein: 5.5 g/dL — ABNORMAL LOW (ref 6.5–8.1)

## 2020-11-05 LAB — C-REACTIVE PROTEIN: CRP: 1.6 mg/dL — ABNORMAL HIGH (ref <1.0)

## 2020-11-05 LAB — GC/CHLAMYDIA PROBE AMP (~~LOC~~) NOT AT ARMC
Chlamydia: NEGATIVE
Comment: NEGATIVE
Comment: NORMAL
Neisseria Gonorrhea: NEGATIVE

## 2020-11-05 LAB — CBC WITH DIFFERENTIAL/PLATELET
Abs Immature Granulocytes: 0 10*3/uL (ref 0.00–0.07)
Basophils Absolute: 0 10*3/uL (ref 0.0–0.1)
Basophils Relative: 1 %
Eosinophils Absolute: 0 10*3/uL (ref 0.0–0.5)
Eosinophils Relative: 1 %
HCT: 35.3 % — ABNORMAL LOW (ref 39.0–52.0)
Hemoglobin: 12 g/dL — ABNORMAL LOW (ref 13.0–17.0)
Immature Granulocytes: 0 %
Lymphocytes Relative: 47 %
Lymphs Abs: 0.7 10*3/uL (ref 0.7–4.0)
MCH: 35.7 pg — ABNORMAL HIGH (ref 26.0–34.0)
MCHC: 34 g/dL (ref 30.0–36.0)
MCV: 105.1 fL — ABNORMAL HIGH (ref 80.0–100.0)
Monocytes Absolute: 0.3 10*3/uL (ref 0.1–1.0)
Monocytes Relative: 23 %
Neutro Abs: 0.4 10*3/uL — CL (ref 1.7–7.7)
Neutrophils Relative %: 28 %
Platelets: 58 10*3/uL — ABNORMAL LOW (ref 150–400)
RBC: 3.36 MIL/uL — ABNORMAL LOW (ref 4.22–5.81)
RDW: 13.2 % (ref 11.5–15.5)
WBC: 1.4 10*3/uL — CL (ref 4.0–10.5)
nRBC: 0 % (ref 0.0–0.2)

## 2020-11-05 LAB — D-DIMER, QUANTITATIVE: D-Dimer, Quant: 1.27 ug/mL-FEU — ABNORMAL HIGH (ref 0.00–0.50)

## 2020-11-05 LAB — MAGNESIUM: Magnesium: 2.4 mg/dL (ref 1.7–2.4)

## 2020-11-05 LAB — PHOSPHORUS: Phosphorus: 4.5 mg/dL (ref 2.5–4.6)

## 2020-11-05 LAB — CBG MONITORING, ED: Glucose-Capillary: 80 mg/dL (ref 70–99)

## 2020-11-05 LAB — FERRITIN: Ferritin: 1027 ng/mL — ABNORMAL HIGH (ref 24–336)

## 2020-11-05 MED ORDER — VALACYCLOVIR HCL 500 MG PO TABS: 250.0000 mg | ORAL_TABLET | ORAL | 2 refills | Status: AC

## 2020-11-05 NOTE — Progress Notes (Addendum)
Renal Navigator received call from Primary/Dr. Shon Baton, who states patient can be discharged after HD today. Navigator needs to ensure transportation is secured to outpatient isolation shift, as he is not be able to utilize public transportation, if this was what he was using prior to [place] dx.  Navigator attempted to call patient, who did not answer. Message left to please call Navigator as soon as possible. Navigator send secure message to HD RN to see if she could speak with patient while in [place] response.  Navigator will continue to follow closely to ensure safe discharge. UPDATE: Navigator was able to reach patient by phone while in HD tx today with assistance of HD RN. Patient understands new outpatient HD schedule and reports that he drives himself. He states that he feels well overall and well enough to drive. Therefore, no barriers to discharge after HD today. Navigator informed HD clinic that patient will be there for treatment tomorrow night and updated primary MD/Dr. Shon Baton.   Maurice Bell, [place] Renal Navigator [phone]

## 2020-11-05 NOTE — ED Notes (Signed)
Report given to dialysis nurse.

## 2020-11-05 NOTE — Discharge Summary (Signed)
Name: Maurice Bell MRN: [id number] DOB: August 18, 1956 65 y.o. PCP: Patient, No Pcp Per  Date of Admission: 11/02/2020  2:17 PM Date of Discharge: 11/05/2020 Attending Physician: Dr. Joni Bell  Discharge Diagnosis: 1. Encephalopathy 2. ESRD (end stage renal disease) 3. Severe hypertension 4. Myoclonic jerking 5. Pancytopenia 6. COVID-19 infection 7. Purulent urethral discharge  Discharge Medications: Allergies as of 11/05/2020      Reactions   Vicodin [hydrocodone-acetaminophen] Itching      Medication List    STOP taking these medications   sildenafil 50 MG tablet Commonly known as: VIAGRA     TAKE these medications   acetaminophen 650 MG CR tablet Commonly known as: TYLENOL Take 1,300 mg by mouth every 8 (eight) hours as needed for pain.   amLODipine 10 MG tablet Commonly known as: NORVASC Take 10 mg by mouth daily. What changed: Another medication with the same name was removed. Continue taking this medication, and follow the directions you see here.   aspirin EC 81 MG tablet Take 81 mg by mouth daily.   hydrALAZINE 50 MG tablet Commonly known as: APRESOLINE Take 100 mg by mouth 2 (two) times a day.   Icy Hot 10-30 % Stck Apply 1 application topically daily as needed (joint pain).   metoprolol tartrate 50 MG tablet Commonly known as: LOPRESSOR Take 150 mg by mouth 2 (two) times daily.   NEPHRO-VITE PO Take 0.8 mg by mouth.   multivitamin Tabs tablet Take 1 tablet by mouth at bedtime.   nicotine 14 mg/24hr patch Commonly known as: NICODERM CQ - dosed in mg/24 hours Apply 68m patch one daily for 6 weeks, then change to 123mpatch. Remove old patch before applying new one.   nicotine 7 mg/24hr patch Commonly known as: NICODERM CQ - dosed in mg/24 hr Apply 27m59match one daily for 2 weeks. Remove old patch before applying new one.   oxyCODONE-acetaminophen 5-325 MG tablet Commonly known as: PERCOCET/ROXICET Take 1 tablet by mouth every 6 (six)  hours as needed for severe pain. What changed: Another medication with the same name was removed. Continue taking this medication, and follow the directions you see here.   senna 8.6 MG Tabs tablet Commonly known as: SENOKOT Take 1 tablet (8.6 mg total) by mouth daily as needed for mild constipation.   sevelamer carbonate 800 MG tablet Commonly known as: RENVELA Take 4 tablets (3,200 mg total) by mouth 3 (three) times daily with meals.   valACYclovir 500 MG tablet Commonly known as: Valtrex Take 0.5 tablets (250 mg total) by mouth every Monday, Wednesday, and Friday. After dialysis What changed:   how much to take  when to take this  additional instructions       Disposition and follow-up:   [name] [facility] St[facility].  At the hospital follow up visit please address:  Encephalopathy in ESRD patient: suspect patient's myoclonus and AMS driven primarily by use of gabapentin and valacyclovir in ESRD patient. Hypertension and concomitant COVID-19 infection possibly contributing.  - Ensure patient does not take any more gabapentin - At discharge, recommended patient take decreased dose of his valacyclovir (250 mg 3 times weekly after dialysis) since he was hesitant to stop it altogether. Recommend discontinue completely should his myoclonus symptoms return - Due to his COVID+ status, arranged to start 3rd shift HD in isolation at his clinic on Wednesday 11/06/20  Severe hypertension: Patient's BP better controlled by discharge. Recheck at follow-up.  Pancytopenia with macrocytosis: - During admission, WBC ~1.3, Hgb 11.8-14.8, MCV 105-108, Plts ~65k - Macrocytosis appears to be chronic - Pathologist smear demonstrates pancytopenia with macrocytosis - Vitamin B12 was low-normal at 327. Methylmalonic acid elevated at 1,711. These findings are suggestive of vitamin B12 deficiency. However, folic acid and homocysteine were not  checked, therefore cannot rule out additional folic acid deficiency. Recommend checking folic acid and homocysteine levels. - Consider bone marrow biopsy and/or hematology oncology referral for further work-up  Purulent urethral discharge History of blood urethral discharge with bowel movements - HIV and RPR were non-reactive - For his urethral discharge GC chlamydia were negative, wet prep is pending - If wet prep also negative, patient will need follow-up with urology  2.  Labs / imaging needed at time of follow-up: CBC  3.  Pending labs/ test needing follow-up: Genital wet prep  Hospital Course by problem list:  Maurice Bell is 65 year-old male with ESRD on TTS HD schedule, resistant hypertension, hx CVA, hx STEMI 11/2019, previous hepatitis B and hepatitis C infectionsadmitted 4/8/[id number] metabolic encephalopathymost likely with multi-factorial etiology includinggabapentin and/or valacyclovir toxcity, acute severesymptomatichypertension,and COVID-19 infection.  Acute Metabolic Encephalopathy Myoclonus Acute Severe Symptomatic Hypertension COVID-19Infection ESRD on TTS HD Patient presented with acute change in mental status over the last few days prior to admission, including slowed responses, difficulty speaking, and worsening of memory. These symptoms also associated with myoclonus/asterixis. Patient had similar presentation during last hospitalization a few weeks prior to this admission. Since discharge, he reports compliance with medications and no missed dialysis sessions, with last session on 10/31/20. Gabapentin, oxycodone, and Valtrex, all of which can cause toxic encephalopathy in people with ESRD, were discontinued on admission. 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 Patient also incidentally found to have COVID-19 infection. Asymptomatic, immunized through vaccination. He was started on remdesivir given his leukopenia and concern for possible immunosuppression, however this was discontinued after 2 doses due to minimal symptoms and no hypoxia. His severe chronic hypertension possibly contributing to AMS, though he was without headache, chest pain, or other symptoms to suggest symptomatic hypertension. During this admission, he had complete resolution of his myoclonus after two HD sessions, and felt nearly at his baseline cognitively. Advised him to stop gapapentin and resume valacyclovir at a reduced dose given his hesitancy to stop valacyclovir altogether. Discharged with plan for dialysis at the 3rd shift HD in isolation at his clinic on 11/06/20.   Pancytopenia On admission, patient with worsening pancytopenia relative to last admission. He is now neutropenic with an ANC of 400, mildly anemic with hemoglobin of 12 g, macrocytic at 108, and has worsening thrombocytopenia at 61,000.  On ultrasound he has a normal-sized spleen.  No signs of bleeding, no signs of infection, no signs of hemolysis.  Mildly elevated LDH. Peripheral smear with pancytopenia with macrocytosis, no blast cells. Etiology of this pancytopenia could be due to his chronic liver disease with hepatitis C virus, B12 deficiency, no risk factors for folate deficiency. Could be a primary bone marrow disorder like MDS, lymphoma, myelofibrosis.  - Vitamin B12 was low-normal at 327. Methylmalonic acid elevated at 1,711. These findings are suggestive of vitamin B12 deficiency. However, folic acid and homocysteine were not checked, therefore cannot rule out additional folic acid deficiency. Recommend checking folic acid and homocysteine  levels. - Consider bone marrow biopsy and/or hematology oncology referral for further work-up  Purulenturethraldischarge Hx bloodyurethraldischarge with bowel  movement Patient found to have purulent urethral discharge on physical examination. He denies current sexual activity. HIV negative in February 2021, RPR negative in 2019. In addition, he reports chronic history of bloody urethral discharge with bowel movement. Patient had been recommended to follow up with outpatient urology for further evaluation. - HIV and RPR were nonreactive - For his urethral discharge GC chlamydia were negative, wet prep is pending - If wet prep also negative, patient will need follow-up with urology  Discharge Vitals:   BP 125/67   Pulse 60   Temp 98.7 F (37.1 C) (Oral)   Resp 16   Ht 5' 5"  (1.651 m)   Wt 52.2 kg   SpO2 97%   BMI 19.14 kg/m   Pertinent Labs, Studies, and Procedures:   - Vitamin B12: 327 - Methylmalonic acid: 1,711 - GC/Chlamydia probe: negative for gonorrhea and chlamydia  CT HEAD WO CONTRAST  Result Date: 11/02/2020 CLINICAL DATA:  Mental status changes EXAM: CT HEAD WITHOUT CONTRAST TECHNIQUE: Contiguous axial images were obtained from the base of the skull through the vertex without intravenous contrast. COMPARISON:  10/22/2020 FINDINGS: Brain: Old left cerebellar infarct. No acute intracranial abnormality. Specifically, no hemorrhage, hydrocephalus, mass lesion, acute infarction, or significant intracranial injury. Vascular: No hyperdense vessel or unexpected calcification. Skull: No acute calvarial abnormality. Sinuses/Orbits: Opacified right mastoid air cells. This is unchanged since prior study. Other: None IMPRESSION: No acute intracranial abnormality. Right mastoid effusion. Electronically Signed   By: Rolm Baptise M.D.   On: 11/02/2020 17:24   DG CHEST PORT 1 VIEW  Result Date: 11/03/2020 CLINICAL DATA:  Weakness and COVID-19 EXAM: PORTABLE CHEST 1 VIEW COMPARISON:  None. FINDINGS: Hazy opacities at the right greater than left lung bases. No pleural effusion or pneumothorax. Normal cardiomediastinal contours. IMPRESSION:  Right-greater-than-left basilar opacity, likely infection. Electronically Signed   By: Ulyses Jarred M.D.   On: 11/03/2020 04:55    Discharge Instructions: Discharge Instructions    Call MD for:  difficulty breathing, headache or visual disturbances   Complete by: As directed    Call MD for:  extreme fatigue   Complete by: As directed    Call MD for:  hives   Complete by: As directed    Call MD for:  persistant dizziness or light-headedness   Complete by: As directed    Call MD for:  persistant nausea and vomiting   Complete by: As directed    Call MD for:  redness, tenderness, or signs of infection (pain, swelling, redness, odor or green/yellow discharge around incision site)   Complete by: As directed    Call MD for:  severe uncontrolled pain   Complete by: As directed    Call MD for:  temperature >100.4   Complete by: As directed      Mr. Spaulding,  It was a pleasure taking care of you in the hospital. You were admitted to the hospital with confusion, memory problems, and shaking which we think occurred due to build up of medications (gabapentin and valacyclovir) in your body due to your kidney dysfunction.   - We would like you to stop taking gabapentin.  - Also recommend you decrease your valacyclovir dose - take 250 mg (1/2 tablet) every MWF after dialysis (or TTS after dialysis depending on your schedule).  - You are scheduled to start the 3rd shift dialysis in isolation tomorrow  night at 5:30 pm - You will need to continue dialysis in isolation for 21 days from 11/03/19 or according to your dialysis center's recommendation - We also tested you for STIs gonorrhea and chlamydia, and these were NEGATIVE. Please follow-up with your PCP/urology regarding your penile discharge.   Please call your primary care doctor's office in the next few days to schedule a follow-up appointment. They can decide whether you will need an in-person or virtual appointment.    Signed: Alexandria Lodge, MD 11/06/2020, 5:24 PM   Pager: [phone]

## 2020-11-05 NOTE — Progress Notes (Signed)
This nurse received report from HD nurse on covid room states nurse was unable to understand her for report, this nurse spoke with charge nurse [place] to give dialysis report. States to just bring pt. To ED pt. Is not assign to a room. HD nurse on covid room made aware.

## 2020-11-05 NOTE — ED Notes (Addendum)
Pt back in ED from Dialysis. Per Education officer, museum note pt is able to be d/c home.

## 2020-11-05 NOTE — Progress Notes (Signed)
  HD#3 Subjective:   No acute events overnight.   During evaluation at bedside this morning, patient states he is "doing even better" than yesterday. States he feels almost completely back to his baseline except still feeling "slightly foggy-headed." He states he thinks he has finally figured out the etiology for his recent issues, valacyclovir. Reports he has been on Valtrex for over 10 years, takes 500 mg three times weekly after dialysis. Discussed continuing Valtrex at a reduced dose in addition to stopping gabapentin. Regarding COVID symptoms, patient denies shortness of breath, cough, chest pain, congestion.   Objective:  Vital signs in last 24 hours: Vitals:   11/05/20 0500 11/05/20 0600 11/05/20 0633 11/05/20 0633  BP: (!) 156/76 (!) 162/76 (!) 169/79   Pulse: 60 61 65   Resp: 11 14 19   Temp:    97.9 F (36.6 C)  TempSrc:    Oral  SpO2: 99% 99% 100%   Weight:      Height:       Supplemental O2: Room Air SpO2: 100 %  Physical Exam:  Physical Exam Constitutional:      General: He is not in acute distress.    Appearance: Normal appearance. He is not toxic-appearing or diaphoretic.  HENT:     Head: Normocephalic and atraumatic.     Nose: No congestion.     Mouth/Throat:     Mouth: Mucous membranes are moist.  Cardiovascular:     Rate and Rhythm: Normal rate and regular rhythm.     Pulses: Normal pulses.     Heart sounds: Normal heart sounds. No murmur heard. No friction rub. No gallop.   Pulmonary:     Effort: Pulmonary effort is normal.     Breath sounds: Normal breath sounds. No wheezing, rhonchi or rales.  Abdominal:     General: Abdomen is flat. Bowel sounds are normal.     Palpations: Abdomen is soft.     Tenderness: There is no abdominal tenderness. There is no guarding.  Genitourinary:    Comments: Dried dark brown discharge in boxer briefs; no discharge at the penis/urethra Skin:    General: Skin is warm and dry.  Neurological:     Mental Status:  He is alert and oriented to person, place, and time.     Comments: myoclonic jerks in the head and upper extremities bilaterally absent; easily able to state the months of the year backwards from December through July    Pertinent Labs: CBC Latest Ref Rng & Units 11/05/2020 11/04/2020 11/03/2020  WBC 4.0 - 10.5 K/uL 1.4(LL) 1.4(LL) 1.2(LL)  Hemoglobin 13.0 - 17.0 g/dL 12.0(L) 14.8 12.2(L)  Hematocrit 39.0 - 52.0 % 35.3(L) 44.1 38.5(L)  Platelets 150 - 400 K/uL 58(L) 68(L) 61(L)    CMP Latest Ref Rng & Units 11/05/2020 11/04/2020 11/03/2020  Glucose 70 - 99 mg/dL 77 74 73  BUN 8 - 23 mg/dL 33(H) 20 46(H)  Creatinine 0.61 - 1.24 mg/dL 7.06(H) 5.37(H) 9.81(H)  Sodium 135 - 145 mmol/L 137 137 138  Potassium 3.5 - 5.1 mmol/L 3.9 3.9 5.1  Chloride 98 - 111 mmol/L 100 98 95(L)  CO2 22 - 32 mmol/L 25 25 25  Calcium 8.9 - 10.3 mg/dL 7.8(L) 8.3(L) 8.9  Total Protein 6.5 - 8.1 g/dL 5.5(L) 6.6 6.3(L)  Total Bilirubin 0.3 - 1.2 mg/dL 0.3 0.9 1.1  Alkaline Phos 38 - 126 U/L 33(L) 35(L) 35(L)  AST 15 - 41 U/L 32 27 28  ALT 0 - 44 U/L   15 13 12    Assessment/Plan:   Principal Problem:   Encephalopathy Active Problems:   ESRD (end stage renal disease) (HCC)   Severe hypertension   Myoclonic jerking   Pancytopenia (HCC)   Patient Summary: [name] is 65 year-old male with ESRD on TTS HD schedule, resistant hypertension, hx CVA, hx STEMI 11/2019, previous hepatitis B and hepatitis C infections admitted 11/02/20 for acute metabolic encephalopathy most likely with multi-factorial etiology including gabapentin toxcity, acute severe symptomatic hypertension and COVID-19 infection.  This is hospital day 3.  Acute Metabolic Encephalopathy Myoclonus Acute Severe Symptomatic Hypertension COVID-19 Infection ESRD on TTS HD: During evaluation this morning, patient's myoclonus is resolved, and his speech is no longer slowed. Continues to be alert and oriented x 3. Denies shortness of breath, endorses  mild cough. Inflammatory markers down from yesterday, suspect rise due to COVID infxn. Upon further discussion with patient, high suspicion for gabapentin and valacyclovir toxicity as main drivers of his presentation, though elevated BP and COVID 19 infection could also be contributing. Patient is stable for discharge pending completion of dialysis today. - Discontinue gabapentin; ensure to discontinue at discharge. Called patient's pharmacy to discontinue remaining refills available.  - Continue to hold Valtrex. Will recommend patient continue decreased dose (250 mg 3 times weekly after dialysis) - Appreciate the renal navigator's assistance with his HD scheduling - HD today - due to his COVID+ status, will start 3rd shift HD in isolation at his clinic tomorrow night (Wed 1/5) at 5:30pm. Pt is aware of the plan and has transportation. His clinic and Dr. Sanford are also aware.  - Will treat conservatively with home oxycodone-acetaminophen at reduced frequency of 5-325 q12h PRN - Lactulose 20 mg solution once daily - Continue home antihypertensives:             - Amlodipine 10mg QHS             - Hydralazine 50mg TID             - Metoprolol tartrate 150mg BID - s/p 2 days of remdesivir - Continue trending inflammatory markers  Pancytopenia:  Pancytopenia worsened since last admission, likely in the setting of newfound COVID 19 infection, stable today. LDH slightly elevated. Pathologist smear shows pancytopenia with macrocytosis, MMA pending. - MMA pending - LDH 197 - Can consider bone marrow biopsy on outpatient basis  Purulent urethral discharge Hx bloody urethral discharge with bowel movement - HIV: non reactive - GC/chlamydia: negative (urethral swab) - RPR: nonreactive - F/u outpatient urology  Chronic constipation Will continue home bowel regimen. - Home senokot daily - Miralax PRN  Hx hepatitis C Hx hepatitis B - HCV RNA: In process   Diet: Carb-Modified IVF:  None VTE: Heparin Code: Full    Dispo: Anticipated discharge to Home in 0-1 days pending dialysis today.    Internal Medicine Resident PGY-1 Pager [phone] Please contact the on call pager after 5 pm and on weekends at [phone]. 

## 2020-11-05 NOTE — ED Notes (Signed)
Tele  Breakfast Ordered 

## 2020-11-05 NOTE — Procedures (Signed)
I was present at this dialysis session. I have reviewed the session itself and made appropriate changes.   Stable on RA.  Set up for COVID shift at [facility].  Myoclonus resolved.    Filed Weights   11/02/20 1521  Weight: 52.2 kg    Recent Labs  Lab 11/05/20 0313  NA 137  K 3.9  CL 100  CO2 25  GLUCOSE 77  BUN 33*  CREATININE 7.06*  CALCIUM 7.8*  PHOS 4.5    Recent Labs  Lab 11/03/20 0535 11/04/20 0942 11/05/20 0313  WBC 1.2* 1.4* 1.4*  NEUTROABS 0.4* 0.5* 0.4*  HGB 12.2* 14.8 12.0*  HCT 38.5* 44.1 35.3*  MCV 108.1* 105.0* 105.1*  PLT 61* 68* 58*    Scheduled Meds: . amLODipine  10 mg Oral QHS  . aspirin EC  81 mg Oral Daily  . Chlorhexidine Gluconate Cloth  6 each Topical Q0600  . heparin  5,000 Units Subcutaneous Q8H  . hydrALAZINE  50 mg Oral Q8H  . lactulose  20 g Oral Daily  . metoprolol tartrate  150 mg Oral BID  . senna  1 tablet Oral Daily   Continuous Infusions: PRN Meds:.hydrALAZINE, oxyCODONE-acetaminophen, polyethylene glycol   Pearson Grippe  MD 11/05/2020, 11:12 AM

## 2020-11-05 NOTE — ED Notes (Signed)
Patient verbalizes understanding of discharge instructions. Opportunity for questioning and answers were provided. Armband removed by staff, pt discharged from ED.  

## 2020-11-05 NOTE — Discharge Instructions (Addendum)
Maurice Bell,  It was a pleasure taking care of you in the hospital. You were admitted to the hospital with confusion, memory problems, and shaking which we think occurred due to build up of medications (gabapentin and valacyclovir) in your body due to your kidney dysfunction.   - We would like you to stop taking gabapentin.  - Also recommend you decrease your valacyclovir dose - take 250 mg (1/2 tablet) every MWF after dialysis (or TTS after dialysis depending on your schedule).  - You are scheduled to start the 3rd shift dialysis in isolation tomorrow night at 5:30 pm - You will need to continue dialysis in isolation for 21 days from 11/03/19 or according to your dialysis center's recommendation - We also tested you for STIs gonorrhea and chlamydia, and these were NEGATIVE. Please follow-up with your PCP/urology regarding your penile discharge.   Please call your primary care doctor's office in the next few days to schedule a follow-up appointment. They can decide whether you will need an in-person or virtual appointment.

## 2020-11-05 NOTE — ED Notes (Signed)
Pt went to dialysis.

## 2020-11-06 ENCOUNTER — Telehealth (HOSPITAL_COMMUNITY): Payer: Self-pay | Admitting: Nephrology

## 2020-11-06 NOTE — Telephone Encounter (Signed)
Transition of care contact from inpatient facility  Date of Discharge: 11/05/20 Date of Contact: 11/06/20 -- attemtped Method of contact: Phone  Attempted to contact patient to discuss transition of care from inpatient admission. Patient did not answer the phone. Message was left on the patient's voicemail with call back number [phone].  Veneta Penton, PA-C [employer] Pager [phone]

## 2020-11-11 ENCOUNTER — Other Ambulatory Visit: Payer: Self-pay | Admitting: General Practice

## 2020-11-11 DIAGNOSIS — N50819 Testicular pain, unspecified: Secondary | ICD-10-CM

## 2020-11-21 ENCOUNTER — Other Ambulatory Visit: Payer: Self-pay

## 2020-11-21 ENCOUNTER — Ambulatory Visit
Admission: RE | Admit: 2020-11-21 | Discharge: 2020-11-21 | Disposition: A | Discharge status: Home / Self Care | Payer: Medicare HMO | Source: Ambulatory Visit | Attending: General Practice | Admitting: General Practice

## 2020-11-21 DIAGNOSIS — N50819 Testicular pain, unspecified: Secondary | ICD-10-CM

## 2020-12-16 ENCOUNTER — Other Ambulatory Visit: Payer: Self-pay

## 2020-12-16 ENCOUNTER — Ambulatory Visit (INDEPENDENT_AMBULATORY_CARE_PROVIDER_SITE_OTHER): Payer: Medicare HMO | Admitting: Podiatry

## 2020-12-16 DIAGNOSIS — M79674 Pain in right toe(s): Secondary | ICD-10-CM | POA: Diagnosis not present

## 2020-12-16 DIAGNOSIS — B351 Tinea unguium: Secondary | ICD-10-CM

## 2020-12-16 DIAGNOSIS — M79675 Pain in left toe(s): Secondary | ICD-10-CM | POA: Diagnosis not present

## 2020-12-16 MED ORDER — CICLOPIROX 8 % EX SOLN: Freq: Every day | CUTANEOUS | 2 refills | Status: DC

## 2020-12-16 NOTE — Patient Instructions (Signed)
Ciclopirox nail solution What is this medicine? CICLOPIROX (sye kloe PEER ox) NAIL SOLUTION is an antifungal medicine. It used to treat fungal infections of the nails. This medicine may be used for other purposes; ask your health care provider or pharmacist if you have questions. COMMON BRAND NAME(S): [id number], Penlac What should I tell my health care provider before I take this medicine? They need to know if you have any of these conditions:  diabetes mellitus  history of seizures  HIV infection  immune system problems or organ transplant  large areas of burned or damaged skin  peripheral vascular disease or poor circulation  taking corticosteroid medication (including steroid inhalers, cream, or lotion)  an unusual or allergic reaction to ciclopirox, isopropyl alcohol, other medicines, foods, dyes, or preservatives  pregnant or trying to get pregnant  breast-feeding How should I use this medicine? This medicine is for external use only. Follow the directions that come with this medicine exactly. Wash and dry your hands before use. Avoid contact with the eyes, mouth or nose. If you do get this medicine in your eyes, rinse out with plenty of cool tap water. Contact your doctor or health care professional if eye irritation occurs. Use at regular intervals. Do not use your medicine more often than directed. Finish the full course prescribed by your doctor or health care professional even if you think you are better. Do not stop using except on your doctor's advice. Talk to your pediatrician regarding the use of this medicine in children. While this medicine may be prescribed for children as young as 12 years for selected conditions, precautions do apply. Overdosage: If you think you have taken too much of this medicine contact a poison control center or emergency room at once. NOTE: This medicine is only for you. Do not share this medicine with others. What if I miss a dose? If you miss a  dose, use it as soon as you can. If it is almost time for your next dose, use only that dose. Do not use double or extra doses. What may interact with this medicine? Interactions are not expected. Do not use any other skin products without telling your doctor or health care professional. This list may not describe all possible interactions. Give your health care provider a list of all the medicines, herbs, non-prescription drugs, or dietary supplements you use. Also tell them if you smoke, drink alcohol, or use illegal drugs. Some items may interact with your medicine. What should I watch for while using this medicine? Tell your doctor or health care professional if your symptoms get worse. Four to six months of treatment may be needed for the nail(s) to improve. Some people may not achieve a complete cure or clearing of the nails by this time. Tell your doctor or health care professional if you develop sores or blisters that do not heal properly. If your nail infection returns after stopping using this product, contact your doctor or health care professional. What side effects may I notice from receiving this medicine? Side effects that you should report to your doctor or health care professional as soon as possible:  allergic reactions like skin rash, itching or hives, swelling of the face, lips, or tongue  severe irritation, redness, burning, blistering, peeling, swelling, oozing Side effects that usually do not require medical attention (report to your doctor or health care professional if they continue or are bothersome):  mild reddening of the skin  nail discoloration  temporary burning or mild   stinging at the site of application This list may not describe all possible side effects. Call your doctor for medical advice about side effects. You may report side effects to FDA at 1-800-FDA-1088. Where should I keep my medicine? Keep out of the reach of children. Store at room temperature  between 15 and 30 degrees C (59 and 86 degrees F). Do not freeze. Protect from light by storing the bottle in the carton after every use. This medicine is flammable. Keep away from heat and flame. Throw away any unused medicine after the expiration date. NOTE: This sheet is a summary. It may not cover all possible information. If you have questions about this medicine, talk to your doctor, pharmacist, or health care provider.  2021 Elsevier/Gold Standard (2008-01-23 16:49:20)  
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
## 2021-03-11 ENCOUNTER — Other Ambulatory Visit: Payer: Self-pay | Admitting: Student

## 2021-03-26 ENCOUNTER — Encounter: Payer: Self-pay | Admitting: Podiatry

## 2021-03-26 ENCOUNTER — Other Ambulatory Visit: Payer: Self-pay

## 2021-03-26 ENCOUNTER — Ambulatory Visit (INDEPENDENT_AMBULATORY_CARE_PROVIDER_SITE_OTHER): Payer: Medicare HMO | Admitting: Podiatry

## 2021-03-26 DIAGNOSIS — M79675 Pain in left toe(s): Secondary | ICD-10-CM | POA: Diagnosis not present

## 2021-03-26 DIAGNOSIS — B351 Tinea unguium: Secondary | ICD-10-CM

## 2021-03-26 DIAGNOSIS — M79674 Pain in right toe(s): Secondary | ICD-10-CM | POA: Diagnosis not present

## 2021-03-30 NOTE — Progress Notes (Signed)
Subjective: Maurice Bell is a pleasant 65 y.o. male patient seen today painful thick toenails that are difficult to trim. Pain interferes with ambulation. Aggravating factors include wearing enclosed shoe gear. Pain is relieved with periodic professional debridement.  He has been prescribed Penlac for his fungal toenails.  He voices no new pedal problems on today's visit.  Allergies  Allergen Reactions  . Vicodin [Hydrocodone-Acetaminophen] Itching    Objective: Physical Exam  General: Maurice Bell is a pleasant 65 y.o. African American male, WD, WN in NAD. AAO x 3.   Vascular:  Capillary refill time to digits immediate b/l. Palpable pedal pulses b/l LE. Pedal hair present. Lower extremity skin temperature gradient within normal limits. No pain with calf compression b/l.  Dermatological:  Pedal skin with normal turgor, texture and tone bilaterally. No open wounds bilaterally. No interdigital macerations bilaterally. Toenails 1-5 b/l elongated, discolored, dystrophic, thickened, crumbly with subungual debris and tenderness to dorsal palpation.  Musculoskeletal:  Normal muscle strength 5/5 to all lower extremity muscle groups bilaterally. No pain crepitus or joint limitation noted with ROM b/l. No gross bony deformities bilaterally.  Neurological:  Protective sensation intact 5/5 intact bilaterally with 10g monofilament b/l.  Assessment and Plan:  1. Dermatophytosis of nail   2. Pain in toes of both feet     -Examined patient. -Patient to continue soft, supportive shoe gear daily. -Toenails 1-5 b/l were debrided in length and girth with sterile nail nippers and dremel without iatrogenic bleeding.  -Patient to report any pedal injuries to medical professional immediately. -Reiterated instructions for application of Penlac to toenails daily. -Patient/POA to call should there be question/concern in the interim.  Return in about 3 months (around 06/26/2021).  Marzetta Board, DPM

## 2021-05-27 ENCOUNTER — Other Ambulatory Visit: Payer: Self-pay | Admitting: Student

## 2021-06-26 DIAGNOSIS — B182 Chronic viral hepatitis C: Secondary | ICD-10-CM | POA: Insufficient documentation

## 2021-06-26 DIAGNOSIS — N529 Male erectile dysfunction, unspecified: Secondary | ICD-10-CM | POA: Insufficient documentation

## 2021-06-30 ENCOUNTER — Other Ambulatory Visit: Payer: Self-pay

## 2021-06-30 ENCOUNTER — Ambulatory Visit (INDEPENDENT_AMBULATORY_CARE_PROVIDER_SITE_OTHER): Payer: Medicare HMO | Admitting: Podiatry

## 2021-06-30 ENCOUNTER — Encounter: Payer: Self-pay | Admitting: Podiatry

## 2021-06-30 DIAGNOSIS — B351 Tinea unguium: Secondary | ICD-10-CM | POA: Diagnosis not present

## 2021-06-30 DIAGNOSIS — M79674 Pain in right toe(s): Secondary | ICD-10-CM

## 2021-06-30 DIAGNOSIS — M79675 Pain in left toe(s): Secondary | ICD-10-CM

## 2021-07-02 NOTE — Progress Notes (Signed)
  Subjective:  Patient ID: Maurice Bell, male    DOB: 03-06-1956,  MRN: [id number]  65 y.o. male presents painful thick toenails that are difficult to trim. Pain interferes with ambulation. Aggravating factors include wearing enclosed shoe gear. Pain is relieved with periodic professional debridement.  He voices no new pedal problems on today's visit.  PCP is Roselee Nova, MD , and last visit was 2 weeks ago..  Allergies  Allergen Reactions   Vicodin [Hydrocodone-Acetaminophen] Itching    Review of Systems: Negative except as noted in the HPI.   Objective:  Vascular Examination: Vascular status intact b/l with palpable pedal pulses. CFT immediate b/l. No edema b/l.Marland Kitchen No pain with calf compression b/l. Skin temperature gradient WNL b/l.   Neurological Examination: Sensation grossly intact b/l with 10 gram monofilament. Vibratory sensation intact b/l.   Dermatological Examination: Pedal skin with normal turgor, texture and tone b/l. Toenails 1-5 b/l thick, discolored, elongated with subungual debris and pain on dorsal palpation. No hyperkeratotic lesions noted b/l.  Musculoskeletal Examination: Muscle strength 5/5 to b/l LE.  Radiographs: None Assessment:   1. Pain due to onychomycosis of toenails of both feet     Plan:  -Examined patient. -No new findings. No new orders. -Patient to continue soft, supportive shoe gear daily. -Toenails 1-5 b/l were debrided in length and girth with sterile nail nippers and dremel without iatrogenic bleeding.  -Patient to report any pedal injuries to medical professional immediately. -Patient/POA to call should there be question/concern in the interim.  Return in about 3 months (around 09/30/2021).  Marzetta Board, DPM

## 2021-08-20 DIAGNOSIS — Z960 Presence of urogenital implants: Secondary | ICD-10-CM | POA: Insufficient documentation

## 2021-10-03 ENCOUNTER — Ambulatory Visit: Payer: Medicare HMO

## 2021-10-03 ENCOUNTER — Emergency Department (HOSPITAL_COMMUNITY)
Admission: EM | Admit: 2021-10-03 | Discharge: 2021-10-04 | Disposition: A | Discharge status: Home / Self Care | Payer: Medicare HMO | Attending: Emergency Medicine | Admitting: Emergency Medicine

## 2021-10-03 ENCOUNTER — Emergency Department (HOSPITAL_COMMUNITY): Payer: Medicare HMO

## 2021-10-03 ENCOUNTER — Encounter (HOSPITAL_COMMUNITY): Payer: Self-pay | Admitting: Emergency Medicine

## 2021-10-03 DIAGNOSIS — Z5321 Procedure and treatment not carried out due to patient leaving prior to being seen by health care provider: Secondary | ICD-10-CM | POA: Diagnosis not present

## 2021-10-03 DIAGNOSIS — N186 End stage renal disease: Secondary | ICD-10-CM | POA: Insufficient documentation

## 2021-10-03 DIAGNOSIS — R072 Precordial pain: Secondary | ICD-10-CM | POA: Insufficient documentation

## 2021-10-03 DIAGNOSIS — R52 Pain, unspecified: Secondary | ICD-10-CM

## 2021-10-03 LAB — CBC
HCT: 34.9 % — ABNORMAL LOW (ref 39.0–52.0)
Hemoglobin: 11.3 g/dL — ABNORMAL LOW (ref 13.0–17.0)
MCH: 34.1 pg — ABNORMAL HIGH (ref 26.0–34.0)
MCHC: 32.4 g/dL (ref 30.0–36.0)
MCV: 105.4 fL — ABNORMAL HIGH (ref 80.0–100.0)
Platelets: 85 10*3/uL — ABNORMAL LOW (ref 150–400)
RBC: 3.31 MIL/uL — ABNORMAL LOW (ref 4.22–5.81)
RDW: 14.5 % (ref 11.5–15.5)
WBC: 3.7 10*3/uL — ABNORMAL LOW (ref 4.0–10.5)
nRBC: 0 % (ref 0.0–0.2)

## 2021-10-03 LAB — BASIC METABOLIC PANEL
Anion gap: 12 (ref 5–15)
BUN: 28 mg/dL — ABNORMAL HIGH (ref 8–23)
CO2: 32 mmol/L (ref 22–32)
Calcium: 9 mg/dL (ref 8.9–10.3)
Chloride: 92 mmol/L — ABNORMAL LOW (ref 98–111)
Creatinine, Ser: 8 mg/dL — ABNORMAL HIGH (ref 0.61–1.24)
GFR, Estimated: 7 mL/min — ABNORMAL LOW (ref >60)
Glucose, Bld: 91 mg/dL (ref 70–99)
Potassium: 5.2 mmol/L — ABNORMAL HIGH (ref 3.5–5.1)
Sodium: 136 mmol/L (ref 135–145)

## 2021-10-03 LAB — TROPONIN I (HIGH SENSITIVITY)
Troponin I (High Sensitivity): 44 ng/L — ABNORMAL HIGH (ref <18)
Troponin I (High Sensitivity): 55 ng/L — ABNORMAL HIGH (ref <18)

## 2021-10-03 LAB — LIPASE, BLOOD: Lipase: 43 U/L (ref 11–51)

## 2021-10-03 NOTE — ED Provider Notes (Signed)
Emergency Medicine Provider Triage Evaluation Note  Maurice Bell , Bell 65 y.o. male  was evaluated in triage.  Pt complains of intermittent, nonradiating, sternal chest pain onset yesterday.  Patient reports he was at dialysis when his chest pain began.  His chest pain feels like Bell burning sensation. Patient has Bell history of GERD and intermittently takes Prilosec.  Patient denies abdominal pain, nausea, vomiting, fever, shortness of breath.  Review of Systems  Positive: Sternal chest pain Negative: Abdominal pain, nausea, vomiting  Physical Exam  BP (!) 150/87 (BP Location: Left Arm)   Pulse 81   Temp 99.5 F (37.5 C)   Resp 16   SpO2 92%  Gen:   Awake, no distress   Resp:  Normal effort  MSK:   Moves extremities without difficulty  Other:  No abdominal tenderness to palpation  Medical Decision Making  Medically screening exam initiated at 10:19 AM.  Appropriate orders placed.  Maurice Bell was informed that the remainder of the evaluation will be completed by another provider, this initial triage assessment does not replace that evaluation, and the importance of remaining in the ED until their evaluation is complete.    Maurice Willis A, PA-C 10/03/21 1029    Maurice Bell, [place] 10/03/21 [id number]

## 2021-10-03 NOTE — ED Notes (Signed)
Pt name called for updated vitals, no response 
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
## 2021-10-03 NOTE — ED Notes (Signed)
Pt name called again for updated vitals, still no response

## 2021-10-06 ENCOUNTER — Other Ambulatory Visit: Payer: Self-pay

## 2021-10-06 ENCOUNTER — Ambulatory Visit (INDEPENDENT_AMBULATORY_CARE_PROVIDER_SITE_OTHER): Payer: Medicare HMO | Admitting: Podiatry

## 2021-10-06 ENCOUNTER — Encounter: Payer: Self-pay | Admitting: Podiatry

## 2021-10-06 DIAGNOSIS — M79675 Pain in left toe(s): Secondary | ICD-10-CM

## 2021-10-06 DIAGNOSIS — M79674 Pain in right toe(s): Secondary | ICD-10-CM | POA: Diagnosis not present

## 2021-10-06 DIAGNOSIS — B351 Tinea unguium: Secondary | ICD-10-CM | POA: Diagnosis not present

## 2021-10-12 NOTE — Progress Notes (Signed)
  Subjective:  Patient ID: Maurice Bell, male    DOB: [birth year],  MRN: [id number]  Maurice Bell presents to clinic today for at risk foot care. Patient has h/o ESRD on hemodialysis and painful elongated mycotic toenails 1-5 bilaterally which are tender when wearing enclosed shoe gear. Pain is relieved with periodic professional debridement.  Patient notes no new pedal problems on today's visit.  PCP is Roselee Nova, MD , and last visit was 10/03/2021.  Allergies  Allergen Reactions   Vicodin [Hydrocodone-Acetaminophen] Itching    Review of Systems: Negative except as noted in the HPI. Objective:   Constitutional Maurice Bell is a pleasant 65 y.o. African American male, thin build in NAD. AAO x 3.   Vascular CFT immediate b/l LE. Palpable DP/PT pulses b/l LE. Digital hair sparse b/l. Skin temperature gradient WNL b/l. No pain with calf compression b/l. No edema noted b/l. No cyanosis or clubbing noted b/l LE.  Neurologic Normal speech. Oriented to person, place, and time. Protective sensation intact 5/5 intact bilaterally with 10g monofilament b/l. Vibratory sensation intact b/l.  Dermatologic Pedal integument with normal turgor, texture and tone b/l LE. No open wounds b/l. No interdigital macerations b/l. Toenails 1-5 b/l elongated, thickened, discolored with subungual debris. +Tenderness with dorsal palpation of nailplates. No hyperkeratotic or porokeratotic lesions present.  Orthopedic: Normal muscle strength 5/5 to all lower extremity muscle groups bilaterally. No pain, crepitus or joint limitation noted with ROM b/l LE. No gross bony pedal deformities b/l. Patient ambulates independently without assistive aids.   Radiographs: None Assessment:   1. Pain due to onychomycosis of toenails of both feet    Plan:  Patient was evaluated and treated and all questions answered. Consent given for treatment as described below: -No new findings. No new orders. -Patient to  continue soft, supportive shoe gear daily. -Mycotic toenails 1-5 bilaterally were debrided in length and girth with sterile nail nippers and dremel without incident. -Patient/POA to call should there be question/concern in the interim.  Return in about 3 months (around 01/04/2022).  Marzetta Board, DPM

## 2021-10-13 ENCOUNTER — Encounter: Payer: Self-pay | Admitting: Physician Assistant

## 2021-10-13 ENCOUNTER — Ambulatory Visit: Payer: Medicare HMO

## 2021-10-13 ENCOUNTER — Ambulatory Visit (INDEPENDENT_AMBULATORY_CARE_PROVIDER_SITE_OTHER): Payer: Medicare HMO | Admitting: Physician Assistant

## 2021-10-13 ENCOUNTER — Other Ambulatory Visit: Payer: Self-pay

## 2021-10-13 VITALS — BP 150/72 | HR 68 | Temp 97.6°F | Resp 20 | Ht 65.0 in | Wt 125.9 lb

## 2021-10-13 DIAGNOSIS — N186 End stage renal disease: Secondary | ICD-10-CM | POA: Diagnosis not present

## 2021-10-13 DIAGNOSIS — Z992 Dependence on renal dialysis: Secondary | ICD-10-CM | POA: Diagnosis not present

## 2021-10-13 NOTE — Progress Notes (Signed)
HISTORY AND PHYSICAL     CC:  dialysis access Requesting Provider:  Roselee Nova, MD  HPI: This is a 65 y.o. male here for evaluation of his hemodialysis access.  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 Trula Slade.  Pt comes in today for evaluation as his fistula bleeds after dialysis.  He states it does not matter where they stick.  He states that the skin is thin over the fistula and does not heal.    He has previous access on the left arm with left RC AVF.  The pt is left hand dominant.    Pt is on dialysis.   Days of dialysis if applicable:  T/T/S    HD center if applicable:  [place] location.   The pt is not on a statin for cholesterol management.  The pt is on a daily aspirin.  Other AC:  none The pt is on BB, CCB for hypertension.  The pt is not diabetic.   Tobacco hx:  former  Past Medical History:  Diagnosis Date   Anemia    Anxiety    Arthritis    Depression    Dyspnea    "only when I have too much fluid"   ESRD (end stage renal disease) on dialysis ([place])    "[place]; TTS" (10/06/2018)   GERD (gastroesophageal reflux disease)    Hepatitis C    "never treated"    History of anemia due to CKD    Hypertension    Myoclonic jerking    Pneumonia    Renal disorder    Stroke ([place])    bilat leg weakness residual- "years ago" - has weakness at times   Wears glasses    Wears partial dentures    lower    Past Surgical History:  Procedure Laterality Date   A/V FISTULAGRAM N/A 03/24/2019   Procedure: A/V FISTULAGRAM - right arm;  Surgeon: Angelia Mould, MD;  Location: [place] [facility];  Service: Cardiovascular;  Laterality: N/A;   ANTERIOR CERVICAL DECOMPRESSION/DISCECTOMY FUSION 4 LEVEL/HARDWARE REMOVAL N/A 05/15/2019   Procedure: REMOVAL OF C3-C7 PLATE, REVISION C3-4 FUSION WITH PARTIAL CORPECTOMY AT C3 AND REPLATING;  Surgeon: Marybelle Killings, MD;  Location: [place];  Service: Orthopedics;  Laterality:  N/A;   ANTERIOR CERVICAL DECOMPRESSION/DISCECTOMY FUSION 4 LEVELS N/A 05/03/2019   Procedure: C3-4, C4-5, C5-6, C6-7 ANTERIOR CERVICAL DECOMPRESSION/DISCECTOMY FUSION, ALLOGRAFTS & PLATE;  Surgeon: Marybelle Killings, MD;  Location: [place];  Service: Orthopedics;  Laterality: N/A;   AV FISTULA PLACEMENT Bilateral    "right side not working anymore" (10/06/2018)   AV FISTULA PLACEMENT Right 02/15/2019   Procedure: Creation of arteriovenous fistula, right arm;  Surgeon: Serafina Mitchell, MD;  Location: [place];  Service: Vascular;  Laterality: Right;   [place] Right 03/29/2019   Procedure: SECOND STAGE BASILIC VEIN TRANSPOSITION RIGHT ARM;  Surgeon: Serafina Mitchell, MD;  Location: [place];  Service: Vascular;  Laterality: Right;   [place] RESECTION     from [place] Left 11/07/2018   Procedure: INSERTION OF TUNNELED DIALYSIS CATHETER - LEFT INTERNAL [place];  Surgeon: Angelia Mould, MD;  Location: [place];  Service: Vascular;  Laterality: Left;   IR THROMBECTOMY AV FISTULA W/THROMBOLYSIS/PTA INC/SHUNT/IMG RIGHT Right 08/02/2019   IR  US GUIDE VASC ACCESS RIGHT  08/02/2019   LEFT HEART CATH AND CORONARY ANGIOGRAPHY N/A 11/09/2019   Procedure: LEFT HEART CATH AND CORONARY ANGIOGRAPHY;  Surgeon: Jettie Booze, MD;  Location: [facility];  Service: Cardiovascular;  Laterality: N/A;   POSTERIOR CERVICAL FUSION/FORAMINOTOMY N/A 05/15/2019   Procedure: POSTERIOR CERVICAL FUSION/FORAMINOTOMY C3-7 WITH LATERAL MASS INSTRUMENTATION;  Surgeon: Marybelle Killings, MD;  Location: [place];  Service: Orthopedics;  Laterality: N/A;   TONSILLECTOMY     UMBILICAL HERNIA REPAIR      Allergies  Allergen Reactions   Vicodin [Hydrocodone-Acetaminophen] Itching    Current Outpatient Medications  Medication Sig Dispense Refill   acetaminophen (TYLENOL) 650 MG CR tablet Take 1,300 mg by mouth every 8  (eight) hours as needed for pain.     alprostadil (EDEX) 20 MCG injection by Intracavitary route.     amLODipine (NORVASC) 10 MG tablet Take 10 mg by mouth daily.     aspirin EC 81 MG tablet Take 81 mg by mouth daily.     B Complex-C-Folic Acid (NEPHRO-VITE PO) Take 0.8 mg by mouth.     buprenorphine (BUTRANS) 5 MCG/HR PTWK 1 patch once a week.     ciclopirox (PENLAC) 8 % solution Apply topically at bedtime. Apply over nail and surrounding skin. Apply daily over previous coat. After seven (7) days, may remove with alcohol and continue cycle. 6.6 mL 2   diphenhydrAMINE-APAP, sleep, (APAP-DIPHENHYDRAMINE PO) Take by mouth.     doxercalciferol (HECTOROL) 0.5 MCG capsule Doxercalciferol (Hectorol)     doxercalciferol (HECTOROL) 4 MCG/2ML injection 4 mcg.     hydrALAZINE (APRESOLINE) 100 MG tablet Take 100 mg by mouth 2 (two) times daily.     hydrocortisone (ANUSOL-HC) 2.5 % rectal cream APPLY TO AFFECTED AREA 3 TIMES A DAY     iron sucrose (VENOFER) 20 MG/ML injection 50 mg.     Menthol-Methyl Salicylate (ICY HOT) 59-74 % STCK Apply 1 application topically daily as needed (joint pain).     Methoxy PEG-Epoetin Beta (MIRCERA IJ) Mircera     metoprolol tartrate (LOPRESSOR) 50 MG tablet Take 150 mg by mouth 2 (two) times daily.     mirtazapine (REMERON) 7.5 MG tablet Take 7.5 mg by mouth at bedtime.     multivitamin (RENA-VIT) TABS tablet Take 1 tablet by mouth at bedtime. 30 tablet 3   nicotine (NICODERM CQ - DOSED IN MG/24 HOURS) 14 mg/24hr patch Apply 14mg  patch one daily for 6 weeks, then change to 14mg  patch. Remove old patch before applying new one. 42 patch 0   nicotine (NICODERM CQ - DOSED IN MG/24 HR) 7 mg/24hr patch Apply 7mg  patch one daily for 2 weeks. Remove old patch before applying new one. 14 patch 0   ondansetron (ZOFRAN-ODT) 4 MG disintegrating tablet Take by mouth.     oxyCODONE-acetaminophen (PERCOCET/ROXICET) 5-325 MG tablet Take 1 tablet by mouth every 6 (six) hours as needed for  severe pain.     senna (SENOKOT) 8.6 MG TABS tablet Take 1 tablet (8.6 mg total) by mouth daily as needed for mild constipation. 120 tablet 0   sevelamer carbonate (RENVELA) 800 MG tablet Take 4 tablets (3,200 mg total) by mouth 3 (three) times daily with meals. 180 tablet 5   sildenafil (VIAGRA) 50 MG tablet Take 50 mg by mouth daily as needed for erectile dysfunction.     valACYclovir (VALTREX) 500 MG tablet Take 1 tablet (500 mg total) by mouth daily. 30 tablet 0  Current Facility-Administered Medications  Medication Dose Route Frequency Provider Last Rate Last Admin   sodium chloride flush (NS) 0.9 % injection 3 mL  3 mL Intravenous PRN Marty Heck, MD        Family History  Problem Relation Age of Onset   Hypertension Mother    Diabetes Mother    Hypertension Father    Diabetes Father    Hypertension Son    High blood pressure Other     Social History   Socioeconomic History   Marital status: Single    Spouse name: Not on file   Number of children: Not on file   Years of education: Not on file   Highest education level: Not on file  Occupational History   Not on file  Tobacco Use   Smoking status: Former    Packs/day: 0.25    Years: 10.00    Pack years: 2.50    Types: Cigarettes    Quit date: 10/02/2020    Years since quitting: 1.0   Smokeless tobacco: Never   Tobacco comments:    patient refused.   Vaping Use   Vaping Use: Never used  Substance and Sexual Activity   Alcohol use: Yes    Alcohol/week: 1.0 standard drink    Types: 1 Standard drinks or equivalent per week    Comment: occasional drink   Drug use: Never    Types: Marijuana    Comment:  "none since 1992"   Sexual activity: Yes  Other Topics Concern   Not on file  Social History Narrative   ** Merged History Encounter **       ** Merged History Encounter **       Social Determinants of Health   Financial Resource Strain: Not on file  Food Insecurity: Not on file  Transportation  Needs: Not on file  Physical Activity: Not on file  Stress: Not on file  Social Connections: Not on file  Intimate Partner Violence: Not on file     ROS: [x]  Positive   [ ]  Negative   [ ]  All sytems reviewed and are negative  Cardiac: []  chest pain/pressure []  SOB []  DOE  Vascular: []  pain in legs while walking []  pain in feet when lying flat []  hx of DVT []  swelling in legs  Pulmonary: []  asthma []  wheezing  Neurologic: []  weakness in []  arms []  legs []  numbness in []  arms []  legs [] difficulty speaking or slurred speech  Hematologic: []  bleeding problems  GI []  GERD  GU: [x]  CKD/renal failure  [x]  HD---[]  M/W/F [x]  T/T/S  Psychiatric: []  hx of major depression  Integumentary: []  rashes []  ulcers  Constitutional: []  fever []  chills   PHYSICAL EXAMINATION:  Today's Vitals   10/13/21 1451  BP: (!) 150/72  Pulse: 68  Resp: 20  Temp: 97.6 F (36.4 C)  TempSrc: Temporal  SpO2: 97%  Weight: 125 lb 14.4 oz (57.1 kg)  Height: 5\' 5"  (1.651 m)   Body mass index is 20.95 kg/m.    General:  WDWN male in NAD Gait: Not observed HENT: WNL Pulmonary: normal non-labored breathing  Cardiac: regular, without carotid bruits Skin: without rashes Vascular Exam/Pulses:   Right Left  Radial 2+ (normal) 2+ (normal)   Extremities:  fistula with good thrill; mildly pulsatile proximally   Musculoskeletal: no muscle wasting or atrophy  Neurologic: A&O X 3; Speech is fluent/normal    ASSESSMENT/PLAN: 65 y.o. 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 Trula Slade.  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  Will proceed with fistulogram and pending results, proceed with plication of these in the near future.  Will also decide at that point whether to do one at a time or both at the same time and he understands that he  may require a tunneled catheter at the time of plication if there is not enough room on the fistula to stick.  He expresses understanding.    Leontine Locket, [facility] [phone]  Clinic MD:   Trula Slade

## 2021-10-13 NOTE — H&P (View-Only) (Signed)
HISTORY AND PHYSICAL     CC:  dialysis access Requesting Provider:  Roselee Nova, MD  HPI: This is a 65 y.o. male here for evaluation of his hemodialysis access.  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 Trula Slade.  Pt comes in today for evaluation as his fistula bleeds after dialysis.  He states it does not matter where they stick.  He states that the skin is thin over the fistula and does not heal.    He has previous access on the left arm with left RC AVF.  The pt is left hand dominant.    Pt is on dialysis.   Days of dialysis if applicable:  T/T/S    HD center if applicable:  [place] location.   The pt is not on a statin for cholesterol management.  The pt is on a daily aspirin.  Other AC:  none The pt is on BB, CCB for hypertension.  The pt is not diabetic.   Tobacco hx:  former  Past Medical History:  Diagnosis Date   Anemia    Anxiety    Arthritis    Depression    Dyspnea    "only when I have too much fluid"   ESRD (end stage renal disease) on dialysis ([place])    "[place]; TTS" (10/06/2018)   GERD (gastroesophageal reflux disease)    Hepatitis C    "never treated"    History of anemia due to CKD    Hypertension    Myoclonic jerking    Pneumonia    Renal disorder    Stroke ([place])    bilat leg weakness residual- "years ago" - has weakness at times   Wears glasses    Wears partial dentures    lower    Past Surgical History:  Procedure Laterality Date   A/V FISTULAGRAM N/A 03/24/2019   Procedure: A/V FISTULAGRAM - right arm;  Surgeon: Angelia Mould, MD;  Location: [facility] [facility];  Service: Cardiovascular;  Laterality: N/A;   ANTERIOR CERVICAL DECOMPRESSION/DISCECTOMY FUSION 4 LEVEL/HARDWARE REMOVAL N/A 05/15/2019   Procedure: REMOVAL OF C3-C7 PLATE, REVISION C3-4 FUSION WITH PARTIAL CORPECTOMY AT C3 AND REPLATING;  Surgeon: Marybelle Killings, MD;  Location: [place];  Service: Orthopedics;  Laterality:  N/A;   ANTERIOR CERVICAL DECOMPRESSION/DISCECTOMY FUSION 4 LEVELS N/A 05/03/2019   Procedure: C3-4, C4-5, C5-6, C6-7 ANTERIOR CERVICAL DECOMPRESSION/DISCECTOMY FUSION, ALLOGRAFTS & PLATE;  Surgeon: Marybelle Killings, MD;  Location: [place];  Service: Orthopedics;  Laterality: N/A;   AV FISTULA PLACEMENT Bilateral    "right side not working anymore" (10/06/2018)   AV FISTULA PLACEMENT Right 02/15/2019   Procedure: Creation of arteriovenous fistula, right arm;  Surgeon: Serafina Mitchell, MD;  Location: [place];  Service: Vascular;  Laterality: Right;   [place] Right 03/29/2019   Procedure: SECOND STAGE BASILIC VEIN TRANSPOSITION RIGHT ARM;  Surgeon: Serafina Mitchell, MD;  Location: [place];  Service: Vascular;  Laterality: Right;   COLON RESECTION     from [place] Left 11/07/2018   Procedure: INSERTION OF TUNNELED DIALYSIS CATHETER - LEFT INTERNAL [place];  Surgeon: Angelia Mould, MD;  Location: [place];  Service: Vascular;  Laterality: Left;   IR THROMBECTOMY AV FISTULA W/THROMBOLYSIS/PTA INC/SHUNT/IMG RIGHT Right 08/02/2019   IR  US GUIDE VASC ACCESS RIGHT  08/02/2019   LEFT HEART CATH AND CORONARY ANGIOGRAPHY N/A 11/09/2019   Procedure: LEFT HEART CATH AND CORONARY ANGIOGRAPHY;  Surgeon: Jettie Booze, MD;  Location: [facility] [facility];  Service: Cardiovascular;  Laterality: N/A;   POSTERIOR CERVICAL FUSION/FORAMINOTOMY N/A 05/15/2019   Procedure: POSTERIOR CERVICAL FUSION/FORAMINOTOMY C3-7 WITH LATERAL MASS INSTRUMENTATION;  Surgeon: Marybelle Killings, MD;  Location: [place];  Service: Orthopedics;  Laterality: N/A;   TONSILLECTOMY     UMBILICAL HERNIA REPAIR      Allergies  Allergen Reactions   Vicodin [Hydrocodone-Acetaminophen] Itching    Current Outpatient Medications  Medication Sig Dispense Refill   acetaminophen (TYLENOL) 650 MG CR tablet Take 1,300 mg by mouth every 8  (eight) hours as needed for pain.     alprostadil (EDEX) 20 MCG injection by Intracavitary route.     amLODipine (NORVASC) 10 MG tablet Take 10 mg by mouth daily.     aspirin EC 81 MG tablet Take 81 mg by mouth daily.     B Complex-C-Folic Acid (NEPHRO-VITE PO) Take 0.8 mg by mouth.     buprenorphine (BUTRANS) 5 MCG/HR PTWK 1 patch once a week.     ciclopirox (PENLAC) 8 % solution Apply topically at bedtime. Apply over nail and surrounding skin. Apply daily over previous coat. After seven (7) days, may remove with alcohol and continue cycle. 6.6 mL 2   diphenhydrAMINE-APAP, sleep, (APAP-DIPHENHYDRAMINE PO) Take by mouth.     doxercalciferol (HECTOROL) 0.5 MCG capsule Doxercalciferol (Hectorol)     doxercalciferol (HECTOROL) 4 MCG/2ML injection 4 mcg.     hydrALAZINE (APRESOLINE) 100 MG tablet Take 100 mg by mouth 2 (two) times daily.     hydrocortisone (ANUSOL-HC) 2.5 % rectal cream APPLY TO AFFECTED AREA 3 TIMES A DAY     iron sucrose (VENOFER) 20 MG/ML injection 50 mg.     Menthol-Methyl Salicylate (ICY HOT) 76-73 % STCK Apply 1 application topically daily as needed (joint pain).     Methoxy PEG-Epoetin Beta (MIRCERA IJ) Mircera     metoprolol tartrate (LOPRESSOR) 50 MG tablet Take 150 mg by mouth 2 (two) times daily.     mirtazapine (REMERON) 7.5 MG tablet Take 7.5 mg by mouth at bedtime.     multivitamin (RENA-VIT) TABS tablet Take 1 tablet by mouth at bedtime. 30 tablet 3   nicotine (NICODERM CQ - DOSED IN MG/24 HOURS) 14 mg/24hr patch Apply 14mg  patch one daily for 6 weeks, then change to 14mg  patch. Remove old patch before applying new one. 42 patch 0   nicotine (NICODERM CQ - DOSED IN MG/24 HR) 7 mg/24hr patch Apply 7mg  patch one daily for 2 weeks. Remove old patch before applying new one. 14 patch 0   ondansetron (ZOFRAN-ODT) 4 MG disintegrating tablet Take by mouth.     oxyCODONE-acetaminophen (PERCOCET/ROXICET) 5-325 MG tablet Take 1 tablet by mouth every 6 (six) hours as needed for  severe pain.     senna (SENOKOT) 8.6 MG TABS tablet Take 1 tablet (8.6 mg total) by mouth daily as needed for mild constipation. 120 tablet 0   sevelamer carbonate (RENVELA) 800 MG tablet Take 4 tablets (3,200 mg total) by mouth 3 (three) times daily with meals. 180 tablet 5   sildenafil (VIAGRA) 50 MG tablet Take 50 mg by mouth daily as needed for erectile dysfunction.     valACYclovir (VALTREX) 500 MG tablet Take 1 tablet (500 mg total) by mouth daily. 30 tablet 0  Current Facility-Administered Medications  Medication Dose Route Frequency Provider Last Rate Last Admin   sodium chloride flush (NS) 0.9 % injection 3 mL  3 mL Intravenous PRN Marty Heck, MD        Family History  Problem Relation Age of Onset   Hypertension Mother    Diabetes Mother    Hypertension Father    Diabetes Father    Hypertension Son    High blood pressure Other     Social History   Socioeconomic History   Marital status: Single    Spouse name: Not on file   Number of children: Not on file   Years of education: Not on file   Highest education level: Not on file  Occupational History   Not on file  Tobacco Use   Smoking status: Former    Packs/day: 0.25    Years: 10.00    Pack years: 2.50    Types: Cigarettes    Quit date: 10/02/2020    Years since quitting: 1.0   Smokeless tobacco: Never   Tobacco comments:    patient refused.   Vaping Use   Vaping Use: Never used  Substance and Sexual Activity   Alcohol use: Yes    Alcohol/week: 1.0 standard drink    Types: 1 Standard drinks or equivalent per week    Comment: occasional drink   Drug use: Never    Types: Marijuana    Comment:  "none since 1992"   Sexual activity: Yes  Other Topics Concern   Not on file  Social History Narrative   ** Merged History Encounter **       ** Merged History Encounter **       Social Determinants of Health   Financial Resource Strain: Not on file  Food Insecurity: Not on file  Transportation  Needs: Not on file  Physical Activity: Not on file  Stress: Not on file  Social Connections: Not on file  Intimate Partner Violence: Not on file     ROS: [x]  Positive   [ ]  Negative   [ ]  All sytems reviewed and are negative  Cardiac: []  chest pain/pressure []  SOB []  DOE  Vascular: []  pain in legs while walking []  pain in feet when lying flat []  hx of DVT []  swelling in legs  Pulmonary: []  asthma []  wheezing  Neurologic: []  weakness in []  arms []  legs []  numbness in []  arms []  legs [] difficulty speaking or slurred speech  Hematologic: []  bleeding problems  GI []  GERD  GU: [x]  CKD/renal failure  [x]  HD---[]  M/W/F [x]  T/T/S  Psychiatric: []  hx of major depression  Integumentary: []  rashes []  ulcers  Constitutional: []  fever []  chills   PHYSICAL EXAMINATION:  Today's Vitals   10/13/21 1451  BP: (!) 150/72  Pulse: 68  Resp: 20  Temp: 97.6 F (36.4 C)  TempSrc: Temporal  SpO2: 97%  Weight: 125 lb 14.4 oz (57.1 kg)  Height: 5\' 5"  (1.651 m)   Body mass index is 20.95 kg/m.    General:  WDWN male in NAD Gait: Not observed HENT: WNL Pulmonary: normal non-labored breathing  Cardiac: regular, without carotid bruits Skin: without rashes Vascular Exam/Pulses:   Right Left  Radial 2+ (normal) 2+ (normal)   Extremities:  fistula with good thrill; mildly pulsatile proximally   Musculoskeletal: no muscle wasting or atrophy  Neurologic: A&O X 3; Speech is fluent/normal    ASSESSMENT/PLAN: 65 y.o. 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 Trula Slade.  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  Will proceed with fistulogram and pending results, proceed with plication of these in the near future.  Will also decide at that point whether to do one at a time or both at the same time and he understands that he  may require a tunneled catheter at the time of plication if there is not enough room on the fistula to stick.  He expresses understanding.    Leontine Locket, [facility] [phone]  Clinic MD:   Trula Slade

## 2021-10-20 ENCOUNTER — Ambulatory Visit (HOSPITAL_COMMUNITY)
Admission: RE | Disposition: A | Discharge status: Home / Self Care | Payer: Self-pay | Source: Ambulatory Visit | Attending: Vascular Surgery

## 2021-10-20 ENCOUNTER — Ambulatory Visit (HOSPITAL_COMMUNITY)
Admission: RE | Admit: 2021-10-20 | Discharge: 2021-10-20 | Disposition: A | Discharge status: Home / Self Care | Payer: Medicare HMO | Source: Ambulatory Visit | Attending: Vascular Surgery | Admitting: Vascular Surgery

## 2021-10-20 ENCOUNTER — Encounter (HOSPITAL_COMMUNITY): Payer: Self-pay | Admitting: Vascular Surgery

## 2021-10-20 ENCOUNTER — Other Ambulatory Visit: Payer: Self-pay

## 2021-10-20 DIAGNOSIS — I120 Hypertensive chronic kidney disease with stage 5 chronic kidney disease or end stage renal disease: Secondary | ICD-10-CM | POA: Insufficient documentation

## 2021-10-20 DIAGNOSIS — Z992 Dependence on renal dialysis: Secondary | ICD-10-CM | POA: Insufficient documentation

## 2021-10-20 DIAGNOSIS — T82898A Other specified complication of vascular prosthetic devices, implants and grafts, initial encounter: Secondary | ICD-10-CM | POA: Diagnosis not present

## 2021-10-20 DIAGNOSIS — Z7982 Long term (current) use of aspirin: Secondary | ICD-10-CM | POA: Diagnosis not present

## 2021-10-20 DIAGNOSIS — T82858A Stenosis of vascular prosthetic devices, implants and grafts, initial encounter: Secondary | ICD-10-CM | POA: Insufficient documentation

## 2021-10-20 DIAGNOSIS — Y841 Kidney dialysis as the cause of abnormal reaction of the patient, or of later complication, without mention of misadventure at the time of the procedure: Secondary | ICD-10-CM | POA: Diagnosis not present

## 2021-10-20 DIAGNOSIS — Z87891 Personal history of nicotine dependence: Secondary | ICD-10-CM | POA: Diagnosis not present

## 2021-10-20 DIAGNOSIS — Z79899 Other long term (current) drug therapy: Secondary | ICD-10-CM | POA: Diagnosis not present

## 2021-10-20 DIAGNOSIS — N186 End stage renal disease: Secondary | ICD-10-CM | POA: Diagnosis not present

## 2021-10-20 HISTORY — PX: PERIPHERAL VASCULAR INTERVENTION: CATH118257

## 2021-10-20 HISTORY — PX: A/V FISTULAGRAM: CATH118298

## 2021-10-20 LAB — POCT I-STAT, CHEM 8
BUN: 62 mg/dL — ABNORMAL HIGH (ref 8–23)
Calcium, Ion: 1.1 mmol/L — ABNORMAL LOW (ref 1.15–1.40)
Chloride: 98 mmol/L (ref 98–111)
Creatinine, Ser: 9.4 mg/dL — ABNORMAL HIGH (ref 0.61–1.24)
Glucose, Bld: 83 mg/dL (ref 70–99)
HCT: 33 % — ABNORMAL LOW (ref 39.0–52.0)
Hemoglobin: 11.2 g/dL — ABNORMAL LOW (ref 13.0–17.0)
Potassium: 5.4 mmol/L — ABNORMAL HIGH (ref 3.5–5.1)
Sodium: 139 mmol/L (ref 135–145)
TCO2: 35 mmol/L — ABNORMAL HIGH (ref 22–32)

## 2021-10-20 SURGERY — A/V FISTULAGRAM
Anesthesia: LOCAL | Laterality: Right

## 2021-10-20 MED ORDER — HEPARIN (PORCINE) IN NACL 2000-0.9 UNIT/L-% IV SOLN
INTRAVENOUS | Status: AC
  Filled 2021-10-20: qty 1000

## 2021-10-20 MED ORDER — LIDOCAINE HCL (PF) 1 % IJ SOLN
INTRAMUSCULAR | Status: DC | PRN
  Administered 2021-10-20: 2 mL via INTRADERMAL

## 2021-10-20 MED ORDER — SODIUM CHLORIDE 0.9% FLUSH: 3.0000 mL | Freq: Two times a day (BID) | INTRAVENOUS | Status: DC

## 2021-10-20 MED ORDER — SODIUM CHLORIDE 0.9 % IV SOLN: 250.0000 mL | INTRAVENOUS | Status: DC | PRN

## 2021-10-20 MED ORDER — HEPARIN (PORCINE) IN NACL 1000-0.9 UT/500ML-% IV SOLN
INTRAVENOUS | Status: DC | PRN
  Administered 2021-10-20: 08:00:00 500 mL

## 2021-10-20 MED ORDER — LIDOCAINE HCL (PF) 1 % IJ SOLN
INTRAMUSCULAR | Status: AC
  Filled 2021-10-20: qty 30

## 2021-10-20 MED ORDER — IODIXANOL 320 MG/ML IV SOLN
INTRAVENOUS | Status: DC | PRN
  Administered 2021-10-20: 08:00:00 35 mL

## 2021-10-20 MED ORDER — SODIUM CHLORIDE 0.9% FLUSH: 3.0000 mL | INTRAVENOUS | Status: DC | PRN

## 2021-10-20 SURGICAL SUPPLY — 20 items
BAG SNAP BAND KOVER 36X36 (MISCELLANEOUS) ×3 IMPLANT
BALLN LUTONIX AV 12X40X75 (BALLOONS) ×3
BALLN MUSTANG 10.0X40 75 (BALLOONS) ×2
BALLN MUSTANG 10.0X40 75CM (BALLOONS) ×1
BALLN MUSTANG 12.0X40 75 (BALLOONS) ×2
BALLN MUSTANG 12.0X40 75CM (BALLOONS) ×1
BALLOON LUTONIX AV 12X40X75 (BALLOONS) IMPLANT
BALLOON MUSTANG 10.0X40 75 (BALLOONS) IMPLANT
BALLOON MUSTANG 12.0X40 75 (BALLOONS) IMPLANT
COVER DOME SNAP 22 D (MISCELLANEOUS) ×3 IMPLANT
KIT ENCORE 26 ADVANTAGE (KITS) ×2 IMPLANT
KIT MICROPUNCTURE NIT STIFF (SHEATH) ×2 IMPLANT
PROTECTION STATION PRESSURIZED (MISCELLANEOUS) ×3
SHEATH PINNACLE R/O II 7F 4CM (SHEATH) ×2 IMPLANT
SHEATH PROBE COVER 6X72 (BAG) ×3 IMPLANT
STATION PROTECTION PRESSURIZED (MISCELLANEOUS) ×1 IMPLANT
STOPCOCK MORSE 400PSI 3WAY (MISCELLANEOUS) ×3 IMPLANT
TRAY PV CATH (CUSTOM PROCEDURE TRAY) ×3 IMPLANT
TUBING CIL FLEX 10 FLL-RA (TUBING) ×3 IMPLANT
WIRE BENTSON .035X145CM (WIRE) ×2 IMPLANT

## 2021-10-20 NOTE — Interval H&P Note (Signed)
History and Physical Interval Note:  10/20/2021 7:23 AM  Maurice Bell  has presented today for surgery, with the diagnosis of instage renal.  The various methods of treatment have been discussed with the patient and family. After consideration of risks, benefits and other options for treatment, the patient has consented to  Procedure(s): A/V FISTULAGRAM (Right) as a surgical intervention.  The patient's history has been reviewed, patient examined, no change in status, stable for surgery.  I have reviewed the patient's chart and labs.  Questions were answered to the patient's satisfaction.     Servando Snare

## 2021-10-20 NOTE — Op Note (Signed)
° ° °  [name] MRN: [id number] DOB: [birth year] Sex: male  10/20/2021 Pre-operative Diagnosis: End-stage renal disease, malfunction right arm AV fistula Post-operative diagnosis:  Same Surgeon:  Eda Paschal. Donzetta Matters, MD Procedure Performed: 1.  Ultrasound-guided cannulation right arm AV fistula 2.  Right upper extremity fistulogram 3.  Drug-coated balloon angioplasty of right innominate vein with 12 mm Lutonix  Indications: 65 year old male with history of right arm AV fistula.  He has had bleeding issues and has 2 areas of pseudoaneurysmal degeneration.  Now indicated for fistulogram with possible invention.  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  At completion this is less than 66% certainly and less filling of the collaterals with an improved thrill in the fistula.  We will see if this improves the bleeding if not he will need to have repair of his pseudoaneurysms in the right arm with possible placement of tunneled dialysis catheter.   Procedure:  The patient was identified in the holding area and taken to room 8.  The patient was then placed supine on the table and prepped and draped in the usual sterile fashion.  A time out was called.  Ultrasound was used to evaluate the right arm AV fistula.  An area just above the anastomosis was anesthetized with 1% lidocaine cannulated micropuncture needle followed by wire and sheath.  An image was saved permanent record.  5 extremity fistulogram was performed with the above findings.  We then placed a Bentson wire followed by 7 [place] sheath.  We first performed serial balloon dilatation of the innominate vein with 10 to 12 mm balloons.  With a 10 mm balloon we also pulled back near one of the pseudoaneurysms and perform retrograde images which  demonstrated patency of the fistula as well as the arteriovenous anastomosis.  We then performed a 12 mm drug-coated balloon angioplasty of innominate vein.  Completion demonstrated improved flow with decreased filling of the collaterals.  There was an improved thrill in the fistula.  Satisfied with this we remove the wire.  The cannulation site was suture-ligated.  He tolerated procedure well without any complication   Contrast: 35cc  Shemika Robbs C. Donzetta Matters, MD [facility] [place] Office: [phone] Pager: [phone]

## 2021-10-20 NOTE — Progress Notes (Signed)
Pt's BP has been 914'N systolic since returning from fistulogram. Pt informed RN that this was actually a good blood pressure for him as a dialysis patient and would come down tomorrow after dialysis. Pt states he is comfortable going home and he will take all of his BP medications when he returns home. No other complaints and other VSS at time of DC.

## 2021-10-21 MED FILL — Heparin Sod (Porcine)-NaCl IV Soln 2000 Unit/L-0.9%: INTRAVENOUS | Qty: 500 | Status: AC

## 2021-10-23 ENCOUNTER — Encounter (HOSPITAL_COMMUNITY): Payer: Self-pay | Admitting: Vascular Surgery

## 2021-11-25 ENCOUNTER — Other Ambulatory Visit: Payer: Self-pay

## 2021-11-25 ENCOUNTER — Emergency Department (HOSPITAL_COMMUNITY): Payer: Medicare HMO

## 2021-11-25 ENCOUNTER — Emergency Department (HOSPITAL_COMMUNITY)
Admission: EM | Admit: 2021-11-25 | Discharge: 2021-11-25 | Disposition: A | Discharge status: Home / Self Care | Payer: Medicare HMO | Attending: Emergency Medicine | Admitting: Emergency Medicine

## 2021-11-25 DIAGNOSIS — Z79899 Other long term (current) drug therapy: Secondary | ICD-10-CM | POA: Diagnosis not present

## 2021-11-25 DIAGNOSIS — R519 Headache, unspecified: Secondary | ICD-10-CM

## 2021-11-25 DIAGNOSIS — N186 End stage renal disease: Secondary | ICD-10-CM | POA: Insufficient documentation

## 2021-11-25 DIAGNOSIS — R112 Nausea with vomiting, unspecified: Secondary | ICD-10-CM | POA: Insufficient documentation

## 2021-11-25 DIAGNOSIS — Z992 Dependence on renal dialysis: Secondary | ICD-10-CM | POA: Diagnosis not present

## 2021-11-25 DIAGNOSIS — Z7982 Long term (current) use of aspirin: Secondary | ICD-10-CM | POA: Insufficient documentation

## 2021-11-25 DIAGNOSIS — I120 Hypertensive chronic kidney disease with stage 5 chronic kidney disease or end stage renal disease: Secondary | ICD-10-CM | POA: Diagnosis not present

## 2021-11-25 LAB — COMPREHENSIVE METABOLIC PANEL
ALT: 17 U/L (ref 0–44)
AST: 28 U/L (ref 15–41)
Albumin: 4 g/dL (ref 3.5–5.0)
Alkaline Phosphatase: 48 U/L (ref 38–126)
Anion gap: 14 (ref 5–15)
BUN: 34 mg/dL — ABNORMAL HIGH (ref 8–23)
CO2: 31 mmol/L (ref 22–32)
Calcium: 8.8 mg/dL — ABNORMAL LOW (ref 8.9–10.3)
Chloride: 92 mmol/L — ABNORMAL LOW (ref 98–111)
Creatinine, Ser: 6.19 mg/dL — ABNORMAL HIGH (ref 0.61–1.24)
GFR, Estimated: 9 mL/min — ABNORMAL LOW (ref >60)
Glucose, Bld: 124 mg/dL — ABNORMAL HIGH (ref 70–99)
Potassium: 3.9 mmol/L (ref 3.5–5.1)
Sodium: 137 mmol/L (ref 135–145)
Total Bilirubin: 0.6 mg/dL (ref 0.3–1.2)
Total Protein: 7.3 g/dL (ref 6.5–8.1)

## 2021-11-25 LAB — CBC
HCT: 32.7 % — ABNORMAL LOW (ref 39.0–52.0)
Hemoglobin: 10.5 g/dL — ABNORMAL LOW (ref 13.0–17.0)
MCH: 33.8 pg (ref 26.0–34.0)
MCHC: 32.1 g/dL (ref 30.0–36.0)
MCV: 105.1 fL — ABNORMAL HIGH (ref 80.0–100.0)
Platelets: 99 10*3/uL — ABNORMAL LOW (ref 150–400)
RBC: 3.11 MIL/uL — ABNORMAL LOW (ref 4.22–5.81)
RDW: 14.4 % (ref 11.5–15.5)
WBC: 3.1 10*3/uL — ABNORMAL LOW (ref 4.0–10.5)
nRBC: 0 % (ref 0.0–0.2)

## 2021-11-25 LAB — LIPASE, BLOOD: Lipase: 336 U/L — ABNORMAL HIGH (ref 11–51)

## 2021-11-25 MED ORDER — DIPHENHYDRAMINE HCL 50 MG/ML IJ SOLN
25.0000 mg | Freq: Once | INTRAMUSCULAR | Status: AC
  Administered 2021-11-25: 15:00:00 25 mg via INTRAVENOUS
  Filled 2021-11-25: qty 1

## 2021-11-25 MED ORDER — DEXAMETHASONE SODIUM PHOSPHATE 10 MG/ML IJ SOLN
10.0000 mg | Freq: Once | INTRAMUSCULAR | Status: AC
  Administered 2021-11-25: 20:00:00 10 mg via INTRAVENOUS
  Filled 2021-11-25: qty 1

## 2021-11-25 MED ORDER — PROCHLORPERAZINE EDISYLATE 10 MG/2ML IJ SOLN
10.0000 mg | Freq: Once | INTRAMUSCULAR | Status: AC
  Administered 2021-11-25: 19:00:00 10 mg via INTRAVENOUS
  Filled 2021-11-25: qty 2

## 2021-11-25 MED ORDER — METOCLOPRAMIDE HCL 5 MG/ML IJ SOLN
10.0000 mg | Freq: Once | INTRAMUSCULAR | Status: AC
  Administered 2021-11-25: 15:00:00 10 mg via INTRAVENOUS
  Filled 2021-11-25: qty 2

## 2021-11-25 MED ORDER — ACETAMINOPHEN 500 MG PO TABS
1000.0000 mg | ORAL_TABLET | Freq: Once | ORAL | Status: AC
  Administered 2021-11-25: 19:00:00 1000 mg via ORAL
  Filled 2021-11-25: qty 2

## 2021-11-25 NOTE — ED Triage Notes (Signed)
EMS stated pt woke up with headache , went to dialysis after 30 min started having N/V, still having the headache.

## 2021-11-25 NOTE — ED Notes (Signed)
Received verbal report from [place] at this time

## 2021-11-25 NOTE — ED Triage Notes (Signed)
CBG 153 

## 2021-11-25 NOTE — ED Notes (Signed)
Pts dialysis access wrapped with gauze

## 2021-11-25 NOTE — ED Provider Notes (Signed)
[facility] EMERGENCY DEPARTMENT Provider Note   CSN: [id number] Arrival date & time: 11/25/21  0825     History  Chief Complaint  Patient presents with   Nausea   Emesis   Headache    Maurice Bell is a 66 y.o. male.   Emesis Associated symptoms: headaches   Headache Associated symptoms: nausea and vomiting   Associated symptoms: no dizziness, no numbness and no weakness   Patient presents for headache, nausea, and vomiting.  Onset of headache was last night at 7 PM.  He describes it as a left-sided frontal headache.  He also states that he typically never gets headaches.  He denies any recent trauma.  At time of onset, he was not exerting himself.  Headache persisted throughout the night.  It continued today.  This morning, he experienced nausea.  He went to his scheduled dialysis session.  During the dialysis session, he began vomiting.  Patient continued to get dialysis but it was cut short 1 hour early due to his persistent vomiting.  From there, he presented to the ED.  Patient gets dialysis on a Tuesday, Thursday, and Saturday.  He states that he never misses an appointment.  He did get a full session on Saturday.  Currently, he continues to endorse severe left frontal headache pain.  His nausea has subsided.  He denies any vision changes or areas of numbness or weakness. Per chart review, medical history is notable for ESRD, HTN, GERD, pancytopenia, hepatitis C, depression, anxiety.    Home Medications Prior to Admission medications   Medication Sig Start Date End Date Taking? Authorizing Provider  acetaminophen (TYLENOL) 650 MG CR tablet Take 1,300 mg by mouth every 8 (eight) hours as needed for pain.    [provider]  alprostadil (EDEX) 20 MCG injection by Intracavitary route. 01/15/21   [provider]  amLODipine (NORVASC) 10 MG tablet Take 10 mg by mouth daily.    [provider]  aspirin EC 81 MG tablet Take 81 mg by  mouth daily.    [provider]  B Complex-C-Folic Acid (NEPHRO-VITE PO) Take 0.8 mg by mouth.    [provider]  buprenorphine (BUTRANS) 5 MCG/HR PTWK 1 patch once a week. 06/02/21   [provider]  ciclopirox (PENLAC) 8 % solution Apply topically at bedtime. Apply over nail and surrounding skin. Apply daily over previous coat. After seven (7) days, may remove with alcohol and continue cycle. 12/16/20   Trula Slade, DPM  diphenhydrAMINE-APAP, sleep, (APAP-DIPHENHYDRAMINE PO) Take by mouth. 05/01/21 04/30/22  [provider]  doxercalciferol (HECTOROL) 0.5 MCG capsule Doxercalciferol (Hectorol) 01/23/21 01/22/22  [provider]  doxercalciferol (HECTOROL) 4 MCG/2ML injection 4 mcg. 01/23/21 01/22/22  [provider]  hydrALAZINE (APRESOLINE) 100 MG tablet Take 100 mg by mouth 2 (two) times daily. 07/09/21   [provider]  hydrocortisone (ANUSOL-HC) 2.5 % rectal cream APPLY TO AFFECTED AREA 3 TIMES A DAY 06/27/21   [provider]  iron sucrose (VENOFER) 20 MG/ML injection 50 mg. 03/04/21 02/24/22  [provider]  Menthol-Methyl Salicylate (ICY HOT) 81-01 % STCK Apply 1 application topically daily as needed (joint pain).    [provider]  Methoxy PEG-Epoetin Beta (MIRCERA IJ) Mircera 06/12/21 06/11/22  [provider]  metoprolol tartrate (LOPRESSOR) 50 MG tablet Take 150 mg by mouth 2 (two) times daily.    [provider]  mirtazapine (REMERON) 7.5 MG tablet Take 7.5 mg by mouth at  bedtime. 08/20/21   [provider]  multivitamin (RENA-VIT) TABS tablet Take 1 tablet by mouth at bedtime. 09/11/18   [name], [name], MD  nicotine (NICODERM CQ - DOSED IN MG/24 HOURS) 14 mg/24hr patch Apply 14mg  patch one daily for 6 weeks, then change to 14mg  patch. Remove old patch before applying new one. 12/13/19   Imogene Burn, PA-C  nicotine (NICODERM CQ - DOSED IN MG/24 HR) 7 mg/24hr patch Apply  7mg  patch one daily for 2 weeks. Remove old patch before applying new one. 12/13/19   Imogene Burn, PA-C  ondansetron (ZOFRAN-ODT) 4 MG disintegrating tablet Take by mouth. 03/18/21   [provider]  oxyCODONE-acetaminophen (PERCOCET/ROXICET) 5-325 MG tablet Take 1 tablet by mouth every 6 (six) hours as needed for severe pain.    [provider]  senna (SENOKOT) 8.6 MG TABS tablet Take 1 tablet (8.6 mg total) by mouth daily as needed for mild constipation. 10/25/20   Alexandria Lodge, MD  sevelamer carbonate (RENVELA) 800 MG tablet Take 4 tablets (3,200 mg total) by mouth 3 (three) times daily with meals. 09/11/18   [name], [name], MD  sildenafil (VIAGRA) 50 MG tablet Take 50 mg by mouth daily as needed for erectile dysfunction.    [provider]  valACYclovir (VALTREX) 500 MG tablet Take 1 tablet (500 mg total) by mouth daily. 05/29/19   [name], NP      Allergies    Vicodin [hydrocodone-acetaminophen]    Review of Systems   Review of Systems  Gastrointestinal:  Positive for nausea and vomiting.  Neurological:  Positive for headaches. Negative for dizziness, syncope, facial asymmetry, speech difficulty, weakness, light-headedness and numbness.  All other systems reviewed and are negative.  Physical Exam Updated Vital Signs BP (!) 170/82    Pulse 74    Temp 98.2 F (36.8 C)    Resp 18    SpO2 100%  Physical Exam Vitals and nursing note reviewed.  Constitutional:      General: He is not in acute distress.    Appearance: He is well-developed and normal weight. He is not ill-appearing, toxic-appearing or diaphoretic.  HENT:     Head: Normocephalic and atraumatic.     Mouth/Throat:     Mouth: Mucous membranes are moist.     Pharynx: Oropharynx is clear.  Eyes:     General: No visual field deficit.    Conjunctiva/sclera: Conjunctivae normal.     Pupils: Pupils are equal, round, and reactive to light.  Cardiovascular:     Rate and Rhythm: Normal  rate and regular rhythm.     Heart sounds: No murmur heard. Pulmonary:     Effort: Pulmonary effort is normal. No respiratory distress.     Breath sounds: Normal breath sounds. No wheezing or rales.  Abdominal:     Palpations: Abdomen is soft.     Tenderness: There is no abdominal tenderness.  Musculoskeletal:        General: No swelling.     Cervical back: Normal range of motion and neck supple.  Skin:    General: Skin is warm and dry.     Capillary Refill: Capillary refill takes less than 2 seconds.  Neurological:     Mental Status: He is alert and oriented to person, place, and time.     Cranial Nerves: No cranial nerve deficit, dysarthria or facial asymmetry.     Sensory: No sensory deficit.     Motor: No weakness.  Coordination: Coordination normal.  Psychiatric:        Mood and Affect: Mood normal.        Behavior: Behavior normal.    ED Results / Procedures / Treatments   Labs (all labs ordered are listed, but only abnormal results are displayed) Labs Reviewed  LIPASE, BLOOD - Abnormal; Notable for the following components:      Result Value   Lipase 336 (*)    All other components within normal limits  COMPREHENSIVE METABOLIC PANEL - Abnormal; Notable for the following components:   Chloride 92 (*)    Glucose, Bld 124 (*)    BUN 34 (*)    Creatinine, Ser 6.19 (*)    Calcium 8.8 (*)    GFR, Estimated 9 (*)    All other components within normal limits  CBC - Abnormal; Notable for the following components:   WBC 3.1 (*)    RBC 3.11 (*)    Hemoglobin 10.5 (*)    HCT 32.7 (*)    MCV 105.1 (*)    Platelets 99 (*)    All other components within normal limits  URINALYSIS, ROUTINE W REFLEX MICROSCOPIC    EKG None  Radiology CT Head Wo Contrast  Result Date: 11/25/2021 CLINICAL DATA:  Headache EXAM: CT HEAD WITHOUT CONTRAST TECHNIQUE: Contiguous axial images were obtained from the base of the skull through the vertex without intravenous contrast. RADIATION  DOSE REDUCTION: This exam was performed according to the departmental dose-optimization program which includes automated exposure control, adjustment of the mA and/or kV according to patient size and/or use of iterative reconstruction technique. COMPARISON:  CT 11/02/2020, MRI 10/22/2020 FINDINGS: Brain: No acute territorial infarction, hemorrhage or intracranial mass. Chronic left cerebellar infarct. The ventricles are stable in size. Vascular: No hyperdense vessels.  No unexpected calcification. Skull: No fracture. Right mastoid effusion. Hyper pneumatized left mastoid air cells. Fluid in the right middle ear. Sinuses/Orbits: No acute finding. Other: None IMPRESSION: 1. No CT evidence for acute intracranial abnormality. Chronic left cerebellar infarct 2. Chronic right mastoid effusion and fluid in the right middle ear. Electronically Signed   By: Donavan Foil M.D.   On: 11/25/2021 15:50    Procedures Procedures    Medications Ordered in ED Medications  metoCLOPramide (REGLAN) injection 10 mg (10 mg Intravenous Given 11/25/21 1526)  diphenhydrAMINE (BENADRYL) injection 25 mg (25 mg Intravenous Given 11/25/21 1526)  prochlorperazine (COMPAZINE) injection 10 mg (10 mg Intravenous Given 11/25/21 1836)  acetaminophen (TYLENOL) tablet 1,000 mg (1,000 mg Oral Given 11/25/21 1836)    ED Course/ Medical Decision Making/ A&P                           Medical Decision Making Amount and/or Complexity of Data Reviewed Labs: ordered. Radiology: ordered.  Risk OTC drugs. Prescription drug management.   This patient presents to the ED for concern of headache, this involves an extensive number of treatment options, and is a complaint that carries with it a high risk of complications and morbidity.  The differential diagnosis includes tension headache, migraine headache, cluster headache, spontaneous intracranial hemorrhage   Co morbidities that complicate the patient evaluation  ESRD, on HD; HTN;  hepatitis; pancytopenia; GERD; anemia; depression   Additional history obtained:  Additional history obtained from N/A External records from outside source obtained and reviewed including EMR   Lab Tests:  I Ordered, and personally interpreted labs.  The pertinent results include: Baseline macrocytic anemia, baseline  leukopenia, baseline thrombocytopenia, normal electrolytes, elevated lipase    Imaging Studies ordered:  I ordered imaging studies including CT head  I independently visualized and interpreted imaging which showed chronic findings of left cerebellar infarct and right mastoid effusion with no acute findings I agree with the radiologist interpretation   Cardiac Monitoring:  The patient was maintained on a cardiac monitor.  I personally viewed and interpreted the cardiac monitored which showed an underlying rhythm of: Sinus rhythm   Medicines ordered and prescription drug management:  I ordered medication including Reglan, Benadryl, Tylenol, and Compazine for analgesia and relief of nausea Reevaluation of the patient after these medicines showed that the patient improved I have reviewed the patients home medicines and have made adjustments as needed  Problem List / ED Course:  66 year old male with no history of chronic headaches, presenting for persistent headache since last night.  Headache progressed today and caused nausea and vomiting.  On arrival in the ED, patient continues to have left frontal headache and intermittent nausea.  He has no focal neurologic deficits on exam.  Patient was initially given Reglan and Benadryl for symptomatic relief.  CT head was ordered and results showed no acute findings.  On reassessment, patient reported improved but still present headache.  Tylenol and Compazine were given.  Care of patient was signed out to oncoming ED provider.   Reevaluation:  After the interventions noted above, I reevaluated the patient and found that they  have :improved   Social Determinants of Health:  Patient has multiple chronic medical conditions.  He does well with outpatient visits including his dialysis.   Dispostion:  After consideration of the diagnostic results and the patients response to treatment, I feel that the patent would benefit from reassessment.          Final Clinical Impression(s) / ED Diagnoses Final diagnoses:  Bad headache    Rx / DC Orders ED Discharge Orders     None         Godfrey Pick, MD 11/25/21 Einar Crow

## 2021-11-25 NOTE — ED Provider Notes (Addendum)
Patient signed out to me awaiting reevaluation after headache cocktail.  Patient developed left-sided headache today.  Head CT is normal.  Lipase was elevated but patient has no abdominal pain or nausea or vomiting.  Overall lab work is unremarkable.  Upon my reevaluation, patient is feeling better.  Discharged in good condition.  Overall suspect migraine type headache.   Lennice Sites, DO 11/25/21 [place], [place], DO 11/25/21 1922

## 2022-01-12 ENCOUNTER — Other Ambulatory Visit: Payer: Self-pay

## 2022-01-12 ENCOUNTER — Ambulatory Visit (INDEPENDENT_AMBULATORY_CARE_PROVIDER_SITE_OTHER): Payer: Medicare HMO | Admitting: Podiatry

## 2022-01-12 DIAGNOSIS — M79674 Pain in right toe(s): Secondary | ICD-10-CM

## 2022-01-12 DIAGNOSIS — B351 Tinea unguium: Secondary | ICD-10-CM | POA: Diagnosis not present

## 2022-01-12 DIAGNOSIS — M79675 Pain in left toe(s): Secondary | ICD-10-CM | POA: Diagnosis not present

## 2022-01-18 ENCOUNTER — Encounter: Payer: Self-pay | Admitting: Podiatry

## 2022-01-18 NOTE — Progress Notes (Signed)
?  Subjective:  ?Patient ID: Maurice Bell, male    DOB: 1956/01/08,  MRN: [id number] ? ?Maurice Bell presents to clinic today for painful elongated mycotic toenails 1-5 bilaterally which are tender when wearing enclosed shoe gear. Pain is relieved with periodic professional debridement. ? ?New problem(s): None.  ? ?PCP is Roselee Nova, MD , and last visit was two weeks ago. ? ?Allergies  ?Allergen Reactions  ? Vicodin [Hydrocodone-Acetaminophen] Itching  ? ? ?Review of Systems: Negative except as noted in the HPI. ? ?Objective: No changes noted in today's physical examination. ? ?Constitutional Maurice Bell is a pleasant 66 y.o. African American male, thin build in NAD. AAO x 3.   ?Vascular CFT immediate b/l LE. Palpable DP/PT pulses b/l LE. Digital hair sparse b/l. Skin temperature gradient WNL b/l. No pain with calf compression b/l. No edema noted b/l. No cyanosis or clubbing noted b/l LE.  ?Neurologic Normal speech. Oriented to person, place, and time. Protective sensation intact 5/5 intact bilaterally with 10g monofilament b/l. Vibratory sensation intact b/l.  ?Dermatologic Pedal integument with normal turgor, texture and tone b/l LE. No open wounds b/l. No interdigital macerations b/l. Toenails 1-5 b/l elongated, thickened, discolored with subungual debris. +Tenderness with dorsal palpation of nailplates. No hyperkeratotic or porokeratotic lesions present.  ?Orthopedic: Normal muscle strength 5/5 to all lower extremity muscle groups bilaterally. No pain, crepitus or joint limitation noted with ROM b/l LE. No gross bony pedal deformities b/l. Patient ambulates independently without assistive aids.  ? ?Radiographs: None ? ?Assessment/Plan: ?1. Pain due to onychomycosis of toenails of both feet   ?-Examined patient. ?-Toenails 1-5 b/l were debrided in length and girth with sterile nail nippers and dremel without iatrogenic bleeding.  ?-Patient/POA to call should there be question/concern in the  interim.  ? ?Return in about 3 months (around 04/14/2022). ? ?Marzetta Board, DPM  ?

## 2022-03-12 DIAGNOSIS — Z87898 Personal history of other specified conditions: Secondary | ICD-10-CM | POA: Insufficient documentation

## 2022-04-15 ENCOUNTER — Ambulatory Visit (INDEPENDENT_AMBULATORY_CARE_PROVIDER_SITE_OTHER): Payer: Self-pay | Admitting: Podiatry

## 2022-04-15 DIAGNOSIS — Z91199 Patient's noncompliance with other medical treatment and regimen due to unspecified reason: Secondary | ICD-10-CM

## 2022-04-20 NOTE — Progress Notes (Signed)
   Complete physical exam  Patient: Maurice Bell   DOB: 08/22/1997   66 y.o. Male  MRN: [id number]  Subjective:    No chief complaint on file.   Maurice Bell is a 66 y.o. male who presents today for a complete physical exam. She reports consuming a {diet types:17450} diet. {types:19826} She generally feels {DESC; WELL/FAIRLY WELL/POORLY:18703}. She reports sleeping {DESC; WELL/FAIRLY WELL/POORLY:18703}. She {does/does not:200015} have additional problems to discuss today.    Most recent fall risk assessment:    04/29/2022   10:42 AM  Fall Risk   Falls in the past year? 0  Number falls in past yr: 0  Injury with Fall? 0  Risk for fall due to : No Fall Risks  Follow up Falls evaluation completed     Most recent depression screenings:    04/29/2022   10:42 AM 03/20/2021   10:46 AM  PHQ 2/9 Scores  PHQ - 2 Score 0 0  PHQ- 9 Score 5     {VISON DENTAL STD PSA (Optional):27386}  {History (Optional):23778}  Patient Care Team: Jessup, Joy, NP as PCP - General (Nurse Practitioner)   Outpatient Medications Prior to Visit  Medication Sig   fluticasone (FLONASE) 50 MCG/ACT nasal spray Place 2 sprays into both nostrils in the morning and at bedtime. After 7 days, reduce to once daily.   norgestimate-ethinyl estradiol (SPRINTEC 28) 0.25-35 MG-MCG tablet Take 1 tablet by mouth daily.   Nystatin POWD Apply liberally to affected area 2 times per day   spironolactone (ALDACTONE) 100 MG tablet Take 1 tablet (100 mg total) by mouth daily.   No facility-administered medications prior to visit.    ROS        Objective:     There were no vitals taken for this visit. {Vitals History (Optional):23777}  Physical Exam   No results found for any visits on 06/04/22. {Show previous labs (optional):23779}    Assessment & Plan:    Routine Health Maintenance and Physical Exam  Immunization History  Administered Date(s) Administered   DTaP 11/05/1999, 01/01/2000,  03/11/2000, 11/25/2000, 06/10/2004   Hepatitis A 04/06/2008, 04/12/2009   Hepatitis B 08/23/1999, 09/30/1999, 03/11/2000   HiB (PRP-OMP) 11/05/1999, 01/01/2000, 03/11/2000, 11/25/2000   IPV 11/05/1999, 01/01/2000, 08/30/2000, 06/10/2004   Influenza,inj,Quad PF,6+ Mos 07/13/2014   Influenza-Unspecified 10/12/2012   MMR 08/30/2001, 06/10/2004   Meningococcal Polysaccharide 04/11/2012   Pneumococcal Conjugate-13 11/25/2000   Pneumococcal-Unspecified 03/11/2000, 05/25/2000   Tdap 04/11/2012   Varicella 08/30/2000, 04/06/2008    Health Maintenance  Topic Date Due   HIV Screening  Never done   Hepatitis C Screening  Never done   INFLUENZA VACCINE  06/02/2022   PAP-Cervical Cytology Screening  06/04/2022 (Originally 08/21/2020)   PAP SMEAR-Modifier  06/04/2022 (Originally 08/21/2020)   TETANUS/TDAP  06/04/2022 (Originally 04/11/2022)   HPV VACCINES  Discontinued   COVID-19 Vaccine  Discontinued    Discussed health benefits of physical activity, and encouraged her to engage in regular exercise appropriate for her age and condition.  Problem List Items Addressed This Visit   None Visit Diagnoses     Annual physical exam    -  Primary   Cervical cancer screening       Need for Tdap vaccination          No follow-ups on file.     Joy Jessup, NP   

## 2022-07-03 ENCOUNTER — Encounter (HOSPITAL_COMMUNITY): Payer: Self-pay | Admitting: *Deleted

## 2022-07-03 ENCOUNTER — Ambulatory Visit (HOSPITAL_COMMUNITY)
Admission: EM | Admit: 2022-07-03 | Discharge: 2022-07-03 | Disposition: A | Discharge status: Home / Self Care | Payer: Medicare Other | Attending: Emergency Medicine | Admitting: Emergency Medicine

## 2022-07-03 DIAGNOSIS — L089 Local infection of the skin and subcutaneous tissue, unspecified: Secondary | ICD-10-CM | POA: Diagnosis not present

## 2022-07-03 MED ORDER — NYSTATIN 100000 UNIT/GM EX POWD: 1.0000 | Freq: Three times a day (TID) | CUTANEOUS | 0 refills | Status: DC

## 2022-07-03 MED ORDER — CEPHALEXIN 500 MG PO CAPS: 500.0000 mg | ORAL_CAPSULE | Freq: Two times a day (BID) | ORAL | 0 refills | Status: AC

## 2022-07-03 NOTE — ED Triage Notes (Signed)
Pt states that he has bilateral feet pain and swelling. He was sent here by another provider. He hasnt taken anything OTC for the pain.

## 2022-07-03 NOTE — ED Provider Notes (Signed)
[place]    CSN: [id number] Arrival date & time: 07/03/22  1310      History   Chief Complaint Chief Complaint  Patient presents with   Foot Pain    HPI Maurice Bell is a 66 y.o. male.   Patient presents with bilateral foot pain and swelling beginning 7 days ago upon awakening.  Painful to bear weight and whenever touched.  Has only been able to wear sandals since symptoms began.  Has attempted elevation which has been minimally effective.  Was evaluated by his PCP today who recommended urgent care evaluation.  Denies respiratory symptoms .  History of CVA, end-stage renal disease.  Last dialysis session 1 day ago, endorses that he has not missed any sessions recently.  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    Past Medical History:  Diagnosis Date   Anemia    Anxiety    Arthritis    Depression    Dyspnea    "only when I have too much fluid"   ESRD (end stage renal disease) on dialysis ([place])    "[place]. [place]; TTS" (10/06/2018)   GERD (gastroesophageal reflux disease)    Hepatitis C    "never treated"    History of anemia due to CKD    Hypertension    Myoclonic jerking    Pneumonia    Renal disorder    Stroke ([place])    bilat leg weakness residual- "years ago" - has weakness at times   Wears glasses    Wears partial dentures    lower    Patient Active Problem List   Diagnosis Date Noted   S/P insertion of penile implant 08/20/2021   Chronic viral hepatitis C ([place]) 06/26/2021   Erectile dysfunction associated with vasculopathy 06/26/2021   Encephalopathy 11/02/2020   Hepatitis C antibody positive in blood 12/06/2019   S/P cervical spinal fusion 09/15/2019   Lumbar foraminal stenosis 09/15/2019   Neural foraminal stenosis of cervical spine 03/06/2019   Other secondary kyphosis, 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   Injury to ligament of cervical spine 03/06/2019   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   Numbness of left hand 02/03/2019    Pancytopenia ([place]) 11/27/2018   GERD (gastroesophageal reflux disease) 11/27/2018   Depression 11/27/2018   Other spondylosis with radiculopathy, cervical region 11/17/2018   Malnutrition of moderate degree 11/04/2018   Endotracheal tube present    Acute metabolic encephalopathy 76/28/3151   Anemia of chronic disease 11/03/2018   Uremia 10/06/2018   Hypertension    Myoclonic jerking    Hepatitis    History of anemia due to CKD    Movement disorder 09/07/2018   Acute encephalopathy 09/07/2018   Right corneal abrasion    ESRD (end stage renal disease) on dialysis [facility])    Hyperkalemia 07/08/2018   Need for acute hemodialysis ([place]) 03/31/2018   ESRD (end stage renal disease) ([place]) 03/31/2018   Severe hypertension 03/31/2018    Past Surgical History:  Procedure Laterality Date   A/V FISTULAGRAM N/A 03/24/2019   Procedure: A/V FISTULAGRAM - right arm;  Surgeon: Angelia Mould, MD;  Location: [place] [facility];  Service: Cardiovascular;  Laterality: N/A;   A/V FISTULAGRAM Right 10/20/2021   Procedure: A/V FISTULAGRAM;  Surgeon: Waynetta Sandy, MD;  Location: [place] [facility];  Service: Cardiovascular;  Laterality: Right;   ANTERIOR CERVICAL DECOMPRESSION/DISCECTOMY FUSION 4 LEVEL/HARDWARE REMOVAL N/A 05/15/2019   Procedure: REMOVAL OF C3-C7 PLATE, REVISION C3-4 FUSION WITH PARTIAL CORPECTOMY AT  C3 AND REPLATING;  Surgeon: Marybelle Killings, MD;  Location: [place];  Service: Orthopedics;  Laterality: N/A;   ANTERIOR CERVICAL DECOMPRESSION/DISCECTOMY FUSION 4 LEVELS N/A 05/03/2019   Procedure: C3-4, C4-5, C5-6, C6-7 ANTERIOR CERVICAL DECOMPRESSION/DISCECTOMY FUSION, ALLOGRAFTS & PLATE;  Surgeon: Marybelle Killings, MD;  Location: [place];  Service: Orthopedics;  Laterality: N/A;   AV FISTULA PLACEMENT Bilateral    "right side not working anymore" (10/06/2018)   AV FISTULA PLACEMENT Right 02/15/2019   Procedure: Creation of arteriovenous fistula, right arm;  Surgeon: Serafina Mitchell, MD;  Location: [place];  Service: Vascular;  Laterality: Right;   [place] Right 03/29/2019   Procedure: SECOND STAGE BASILIC VEIN TRANSPOSITION RIGHT ARM;  Surgeon: Serafina Mitchell, MD;  Location: [facility];  Service: Vascular;  Laterality: Right;   CATARACT EXTRACTION Bilateral    COLON RESECTION     from [place]   COLONOSCOPY W/ BIOPSIES AND POLYPECTOMY     HERNIA REPAIR     INSERTION OF DIALYSIS CATHETER Left 11/07/2018   Procedure: INSERTION OF TUNNELED DIALYSIS CATHETER - LEFT INTERNAL JUGULAR PLACEMENT;  Surgeon: Angelia Mould, MD;  Location: [facility];  Service: Vascular;  Laterality: Left;   IR THROMBECTOMY AV FISTULA W/THROMBOLYSIS/PTA INC/SHUNT/IMG RIGHT Right 08/02/2019   IR US GUIDE VASC ACCESS RIGHT  08/02/2019   LEFT HEART CATH AND CORONARY ANGIOGRAPHY N/A 11/09/2019   Procedure: LEFT HEART CATH AND CORONARY ANGIOGRAPHY;  Surgeon: Jettie Booze, MD;  Location: [place] [facility];  Service: Cardiovascular;  Laterality: N/A;   PERIPHERAL VASCULAR INTERVENTION Right 10/20/2021   Procedure: PERIPHERAL VASCULAR INTERVENTION;  Surgeon: Waynetta Sandy, MD;  Location: [place] [facility];  Service: Cardiovascular;  Laterality: Right;   POSTERIOR CERVICAL FUSION/FORAMINOTOMY N/A 05/15/2019   Procedure: POSTERIOR CERVICAL FUSION/FORAMINOTOMY C3-7 WITH LATERAL MASS INSTRUMENTATION;  Surgeon: Marybelle Killings, MD;  Location: [place];  Service: Orthopedics;  Laterality: N/A;   TONSILLECTOMY     UMBILICAL HERNIA REPAIR         Home Medications    Prior to Admission medications   Medication Sig Start Date End Date Taking? Authorizing Provider  acetaminophen (TYLENOL) 650 MG CR tablet Take 1,300 mg by mouth every 8 (eight) hours as needed for pain.   Yes [provider]  amitriptyline (ELAVIL) 10 MG tablet Take by mouth. 12/12/21  Yes [provider]  buprenorphine (BUTRANS) 5 MCG/HR PTWK 1 patch once a week. 06/02/21  Yes [provider]  ciclopirox (PENLAC) 8 % solution Apply topically at bedtime. Apply over nail and surrounding skin. Apply daily over previous coat. After seven (7) days, may remove with alcohol and continue cycle. 12/16/20  Yes Trula Slade, DPM  hydrALAZINE (APRESOLINE) 100 MG tablet Take 100 mg by mouth 2 (two) times daily. 07/09/21  Yes [provider]  metoprolol tartrate (LOPRESSOR) 50 MG tablet Take 150 mg by mouth 2 (two) times daily.   Yes [provider]  mirtazapine (REMERON) 7.5 MG tablet Take 7.5 mg by mouth at bedtime. 08/20/21  Yes [provider]  multivitamin (RENA-VIT) TABS tablet Take 1 tablet by mouth at bedtime. 09/11/18  Yes [name], [name], MD  oxyCODONE-acetaminophen (PERCOCET/ROXICET) 5-325 MG tablet Take 1 tablet by mouth every 6 (six) hours as needed for severe pain.   Yes [provider]  sertraline (ZOLOFT) 25 MG tablet Take by mouth. 12/29/21  Yes [provider]  sevelamer carbonate (RENVELA) 800 MG tablet Take 4 tablets (3,200  mg total) by mouth 3 (three) times daily with meals. 09/11/18  Yes [name], [name], MD  SUMAtriptan (IMITREX) 50 MG tablet Take by mouth. 12/05/21  Yes [provider]  valACYclovir (VALTREX) 500 MG tablet Take 1 tablet (500 mg total) by mouth daily. 05/29/19  Yes [name], NP  alprostadil (EDEX) 20 MCG injection by Intracavitary route. 01/15/21   [provider]  amLODipine (NORVASC) 10 MG tablet Take 10 mg by mouth daily.    [provider]  aspirin EC 81 MG tablet Take 81 mg by mouth daily.    [provider]  B Complex-C-Folic Acid (NEPHRO-VITE PO) Take 0.8 mg by mouth.    [provider]  diphenhydrAMINE-APAP, sleep, (APAP-DIPHENHYDRAMINE PO) Take by mouth. 05/01/21 04/30/22  [provider]  hydrocortisone (ANUSOL-HC) 2.5 % rectal cream APPLY TO AFFECTED AREA 3 TIMES A DAY 06/27/21   [provider]  iron sucrose (VENOFER) 20  MG/ML injection 50 mg. 03/04/21 02/24/22  [provider]  Menthol-Methyl Salicylate (ICY HOT) 51-70 % STCK Apply 1 application topically daily as needed (joint pain).    [provider]  nicotine (NICODERM CQ - DOSED IN MG/24 HOURS) 14 mg/24hr patch Apply 14mg  patch one daily for 6 weeks, then change to 14mg  patch. Remove old patch before applying new one. 12/13/19   Imogene Burn, PA-C  nicotine (NICODERM CQ - DOSED IN MG/24 HR) 7 mg/24hr patch Apply 7mg  patch one daily for 2 weeks. Remove old patch before applying new one. 12/13/19   Imogene Burn, PA-C  ondansetron (ZOFRAN-ODT) 4 MG disintegrating tablet Take by mouth. 03/18/21   [provider]  senna (SENOKOT) 8.6 MG TABS tablet Take 1 tablet (8.6 mg total) by mouth daily as needed for mild constipation. 10/25/20   Alexandria Lodge, MD  sildenafil (VIAGRA) 50 MG tablet Take 50 mg by mouth daily as needed for erectile dysfunction.    [provider]    Family History Family History  Problem Relation Age of Onset   Hypertension Mother    Diabetes Mother    Hypertension Father    Diabetes Father    Hypertension Son    High blood pressure Other     Social History Social History   Tobacco Use   Smoking status: Every Day    Packs/day: 0.25    Types: Cigarettes    Last attempt to quit: 10/02/2020    Years since quitting: 1.7   Smokeless tobacco: Never   Tobacco comments:    patient refused.   Vaping Use   Vaping Use: Never used  Substance Use Topics   Alcohol use: Not Currently    Alcohol/week: 1.0 standard drink of alcohol    Types: 1 Standard drinks or equivalent per week   Drug use: Never    Types: Marijuana    Comment:  "none since 1992"     Allergies   Vicodin [hydrocodone-acetaminophen]   Review of Systems Review of Systems  Constitutional: Negative.   Respiratory: Negative.    Cardiovascular: Negative.   Musculoskeletal:  Positive for gait problem. Negative for arthralgias,  back pain, joint swelling, myalgias, neck pain and neck stiffness.     Physical Exam Triage Vital Signs ED Triage Vitals  Enc Vitals Group     BP 07/03/22 1351 (!) 153/68     Pulse Rate 07/03/22 1351 71     Resp 07/03/22 1351 18     Temp 07/03/22 1351 98 F (36.7 C)  Temp Source 07/03/22 1351 Oral     SpO2 07/03/22 1351 96 %     Weight --      Height --      Head Circumference --      Peak Flow --      Pain Score 07/03/22 1346 8     Pain Loc --      Pain Edu? --      Excl. in [place]? --    No data found.  Updated Vital Signs BP (!) 153/68 (BP Location: Left Arm)   Pulse 71   Temp 98 F (36.7 C) (Oral)   Resp 18   SpO2 96%   Visual Acuity Right Eye Distance:   Left Eye Distance:   Bilateral Distance:    Right Eye Near:   Left Eye Near:    Bilateral Near:     Physical Exam Constitutional:      Appearance: Normal appearance.  Eyes:     Extraocular Movements: Extraocular movements intact.  Musculoskeletal:       Feet:  Feet:     Comments: Moderate to severe swelling present to the bilateral midfoot, with point tenderness over the indicated areas above, skin is erythematous and dusky, severe fungus to the toes, sensation intact, 2 plus dorsalis pedis pulse Neurological:     Mental Status: He is alert and oriented to person, place, and time. Mental status is at baseline.  Psychiatric:        Mood and Affect: Mood normal.        Behavior: Behavior normal.      UC Treatments / Results  Labs (all labs ordered are listed, but only abnormal results are displayed) Labs Reviewed - No data to display  EKG   Radiology No results found.  Procedures Procedures (including critical care time)  Medications Ordered in UC Medications - No data to display  Initial Impression / Assessment and Plan / UC Course  I have reviewed the triage vital signs and the nursing notes.  Pertinent labs & imaging results that were available during my care of the patient were  reviewed by me and considered in my medical decision making (see chart for details).  Foot infection  Etiology is most likely related to toenail fungus that has created allowed for bacteria to enter the body, discussed with patient, case reviewed by Dr. Lanny Cramp, prescribed nystatin powder as well as oral Keflex to manage symptoms and recommended over-the-counter analgesics and elevation for additional supportive measures, recommended follow-up with podiatry for reevaluation of symptoms who per chart review have been managing fungus with debridement treatments but patient missed June appointment, given strict precautions that if no improvement seen if symptoms continue to worsen that he is to go to the nearest emergency department for further evaluation and management Final Clinical Impressions(s) / UC Diagnoses   Final diagnoses:  None   Discharge Instructions   None    ED Prescriptions   None    PDMP not reviewed this encounter.   Hans Eden, NP 07/03/22 1424

## 2022-07-03 NOTE — Discharge Instructions (Signed)
You are being treated for a foot infection that most likely came from a break in your toenails  Begin applying nystatin powder 3 times a day after cleansing with antibacterial soap and water then patting dry  Begin Keflex every morning and every evening for the next 5 days to help clear infection  You may continue to elevate area when sitting and lying to help further reduce swelling  May continue to take Tylenol 500 to 1000 mg every 6 hours for pain  May use ice over the affected area into the 15-minute intervals  You do not see any improvement in your symptoms and they begin to worsen please go to the nearest emergency department for evaluation for IV medications  Schedule a follow-up appointment with your podiatrist for further evaluation and management

## 2022-07-28 ENCOUNTER — Ambulatory Visit (INDEPENDENT_AMBULATORY_CARE_PROVIDER_SITE_OTHER): Payer: Medicare Other

## 2022-07-28 ENCOUNTER — Ambulatory Visit (INDEPENDENT_AMBULATORY_CARE_PROVIDER_SITE_OTHER): Payer: Medicare Other | Admitting: Podiatry

## 2022-07-28 DIAGNOSIS — M79671 Pain in right foot: Secondary | ICD-10-CM | POA: Diagnosis not present

## 2022-07-28 DIAGNOSIS — M7989 Other specified soft tissue disorders: Secondary | ICD-10-CM

## 2022-07-28 DIAGNOSIS — R609 Edema, unspecified: Secondary | ICD-10-CM | POA: Diagnosis not present

## 2022-07-28 DIAGNOSIS — M79672 Pain in left foot: Secondary | ICD-10-CM

## 2022-07-28 MED ORDER — DOXYCYCLINE HYCLATE 100 MG PO TABS: 100.0000 mg | ORAL_TABLET | Freq: Two times a day (BID) | ORAL | 0 refills | Status: DC

## 2022-07-28 NOTE — Progress Notes (Signed)
Subjective: Chief Complaint  Patient presents with   Foot Problem    Patient came in today for soft tissue mass bilaterally, which started 2 weeks, swelling, unable to walk, rate of pain 3 out of 10, X-Rays done today  Nail trim today also    66 year old male presents the above concerns.  He states that about 2 weeks ago he noticed swelling to the top of his foot.  States that it was a ball looking on top of his foot.  He was on antibiotics which did help however the symptoms do continue.  Denies any recent injury.  On the left foot he did previously notice drainage when it got really swollen.  Objective: AAO x3, NAD DP/PT pulses palpable bilaterally, CRT less than 3 seconds The dorsal aspect of bilateral midfoot is localized pocket of swelling, edema.  The right foot appears to be more of a cystic type structure on the left foot is more diffuse.  There are some peeling skin on the dorsal foot on the left side where it looked like it had been opened previously but currently there is no open lesion there is no drainage.  There is no erythema or warmth. No pain with calf compression, swelling, warmth, erythema  Assessment: Mild swelling midfoot  Plan: -All treatment options discussed with the patient including all alternatives, risks, complications.  -X-rays were obtained and reviewed.  3 views of bilateral feet were obtained.  No evidence of acute fracture.  No evidence of acute osteomyelitis.  Edema present in the midfoot. -Given the edema I recommended aspiration to see if there is any pocket of infection or cyst.  Discussed risks of this and he agrees.  Verbal consent obtained.  I cleaned skin with alcohol and mixture of lidocaine, Marcaine plain was infiltrated in a regional block fashion.  Then prepped the skin with Betadine.  On the left foot as this previous had drainage I started trying to aspirate this and I was not able to identify any fluid but only blood was expressed.  After multiple  attempts I abandoned this.  Compression bandage applied. -We will restart antibiotics.  Prescribed doxycycline -If no improvement MRI -Monitor for any clinical signs or symptoms of infection and directed to call the office immediately should any occur or go to the ER. -Patient encouraged to call the office with any questions, concerns, change in symptoms.   Return in about 3 weeks (around 08/18/2022).  Trula Slade DPM

## 2022-07-29 ENCOUNTER — Ambulatory Visit (INDEPENDENT_AMBULATORY_CARE_PROVIDER_SITE_OTHER): Payer: Medicare Other | Admitting: Vascular Surgery

## 2022-07-29 ENCOUNTER — Encounter: Payer: Self-pay | Admitting: Vascular Surgery

## 2022-07-29 VITALS — BP 165/76 | HR 64 | Temp 98.4°F | Resp 20 | Ht 65.0 in | Wt 124.3 lb

## 2022-07-29 DIAGNOSIS — N186 End stage renal disease: Secondary | ICD-10-CM | POA: Diagnosis not present

## 2022-07-29 DIAGNOSIS — Z992 Dependence on renal dialysis: Secondary | ICD-10-CM | POA: Diagnosis not present

## 2022-07-29 NOTE — H&P (View-Only) (Signed)
Patient ID: Maurice Bell, male   DOB: 26-Jun-1956, 66 y.o.   MRN: [id number]  Reason for Consult: Follow-up   Referred by Elmarie Shiley, MD  Subjective:     HPI:  Maurice Bell is a 66 y.o. male history of end-stage renal disease on dialysis via right arm AV fistula.  He has had some excess bleeding from the fistula and is using his dialysis site today from yesterday.  He recently had fistulogram performed by Dr. Augustin Coupe states that the flow has been better.  He does not have any right upper extremity pain today.  Past Medical History:  Diagnosis Date   Anemia    Anxiety    Arthritis    Depression    Dyspnea    "only when I have too much fluid"   ESRD (end stage renal disease) on dialysis ([place])    "[place] [place]; TTS" (10/06/2018)   GERD (gastroesophageal reflux disease)    Hepatitis C    "never treated"    History of anemia due to CKD    Hypertension    Myoclonic jerking    Pneumonia    Renal disorder    Stroke (HCC)    bilat leg weakness residual- "years ago" - has weakness at times   Wears glasses    Wears partial dentures    lower   Family History  Problem Relation Age of Onset   Hypertension Mother    Diabetes Mother    Hypertension Father    Diabetes Father    Hypertension Son    High blood pressure Other    Past Surgical History:  Procedure Laterality Date   A/V FISTULAGRAM N/A 03/24/2019   Procedure: A/V FISTULAGRAM - right arm;  Surgeon: Angelia Mould, MD;  Location: [place] [facility];  Service: Cardiovascular;  Laterality: N/A;   A/V FISTULAGRAM Right 10/20/2021   Procedure: A/V FISTULAGRAM;  Surgeon: Waynetta Sandy, MD;  Location: [place] [facility];  Service: Cardiovascular;  Laterality: Right;   ANTERIOR CERVICAL DECOMPRESSION/DISCECTOMY FUSION 4 LEVEL/HARDWARE REMOVAL N/A 05/15/2019   Procedure: REMOVAL OF C3-C7 PLATE, REVISION C3-4 FUSION WITH PARTIAL CORPECTOMY AT C3 AND REPLATING;  Surgeon: Marybelle Killings, MD;  Location: [place];   Service: Orthopedics;  Laterality: N/A;   ANTERIOR CERVICAL DECOMPRESSION/DISCECTOMY FUSION 4 LEVELS N/A 05/03/2019   Procedure: C3-4, C4-5, C5-6, C6-7 ANTERIOR CERVICAL DECOMPRESSION/DISCECTOMY FUSION, ALLOGRAFTS & PLATE;  Surgeon: Marybelle Killings, MD;  Location: [place];  Service: Orthopedics;  Laterality: N/A;   AV FISTULA PLACEMENT Bilateral    "right side not working anymore" (10/06/2018)   AV FISTULA PLACEMENT Right 02/15/2019   Procedure: Creation of arteriovenous fistula, right arm;  Surgeon: Serafina Mitchell, MD;  Location: [place];  Service: Vascular;  Laterality: Right;   [place] Right 03/29/2019   Procedure: SECOND STAGE BASILIC VEIN TRANSPOSITION RIGHT ARM;  Surgeon: Serafina Mitchell, MD;  Location: [place];  Service: Vascular;  Laterality: Right;   CATARACT EXTRACTION Bilateral    COLON RESECTION     from [place]   COLONOSCOPY W/ BIOPSIES AND POLYPECTOMY     HERNIA REPAIR     INSERTION OF DIALYSIS CATHETER Left 11/07/2018   Procedure: INSERTION OF TUNNELED DIALYSIS CATHETER - LEFT INTERNAL JUGULAR PLACEMENT;  Surgeon: Angelia Mould, MD;  Location: [place];  Service: Vascular;  Laterality: Left;   IR THROMBECTOMY AV FISTULA W/THROMBOLYSIS/PTA INC/SHUNT/IMG RIGHT Right 08/02/2019   IR US GUIDE VASC ACCESS RIGHT  08/02/2019   LEFT HEART  CATH AND CORONARY ANGIOGRAPHY N/A 11/09/2019   Procedure: LEFT HEART CATH AND CORONARY ANGIOGRAPHY;  Surgeon: Jettie Booze, MD;  Location: [place] [facility];  Service: Cardiovascular;  Laterality: N/A;   PERIPHERAL VASCULAR INTERVENTION Right 10/20/2021   Procedure: PERIPHERAL VASCULAR INTERVENTION;  Surgeon: Waynetta Sandy, MD;  Location: [place] [facility];  Service: Cardiovascular;  Laterality: Right;   POSTERIOR CERVICAL FUSION/FORAMINOTOMY N/A 05/15/2019   Procedure: POSTERIOR CERVICAL FUSION/FORAMINOTOMY C3-7 WITH LATERAL MASS INSTRUMENTATION;  Surgeon: Marybelle Killings, MD;  Location: [place];  Service:  Orthopedics;  Laterality: N/A;   TONSILLECTOMY     UMBILICAL HERNIA REPAIR      Short Social History:  Social History   Tobacco Use   Smoking status: Every Day    Packs/day: 0.25    Types: Cigarettes    Last attempt to quit: 10/02/2020    Years since quitting: 1.8   Smokeless tobacco: Never   Tobacco comments:    patient refused.   Substance Use Topics   Alcohol use: Not Currently    Alcohol/week: 1.0 standard drink of alcohol    Types: 1 Standard drinks or equivalent per week    Allergies  Allergen Reactions   Vicodin [Hydrocodone-Acetaminophen] Itching    Current Outpatient Medications  Medication Sig Dispense Refill   acetaminophen (TYLENOL) 650 MG CR tablet Take 1,300 mg by mouth every 8 (eight) hours as needed for pain.     alprostadil (EDEX) 20 MCG injection by Intracavitary route.     amitriptyline (ELAVIL) 10 MG tablet Take by mouth.     amLODipine (NORVASC) 10 MG tablet Take 10 mg by mouth daily.     aspirin EC 81 MG tablet Take 81 mg by mouth daily.     B Complex-C-Folic Acid (NEPHRO-VITE PO) Take 0.8 mg by mouth.     buprenorphine (BUTRANS) 5 MCG/HR PTWK 1 patch once a week.     ciclopirox (PENLAC) 8 % solution Apply topically at bedtime. Apply over nail and surrounding skin. Apply daily over previous coat. After seven (7) days, may remove with alcohol and continue cycle. 6.6 mL 2   doxycycline (VIBRA-TABS) 100 MG tablet Take 1 tablet (100 mg total) by mouth 2 (two) times daily. 20 tablet 0   hydrALAZINE (APRESOLINE) 100 MG tablet Take 100 mg by mouth 2 (two) times daily.     hydrocortisone (ANUSOL-HC) 2.5 % rectal cream APPLY TO AFFECTED AREA 3 TIMES A DAY     Menthol-Methyl Salicylate (ICY HOT) 16-10 % STCK Apply 1 application topically daily as needed (joint pain).     metoprolol tartrate (LOPRESSOR) 50 MG tablet Take 150 mg by mouth 2 (two) times daily.     mirtazapine (REMERON) 7.5 MG tablet Take 7.5 mg by mouth at bedtime.     multivitamin (RENA-VIT) TABS  tablet Take 1 tablet by mouth at bedtime. 30 tablet 3   nicotine (NICODERM CQ - DOSED IN MG/24 HOURS) 14 mg/24hr patch Apply 14mg  patch one daily for 6 weeks, then change to 14mg  patch. Remove old patch before applying new one. 42 patch 0   nicotine (NICODERM CQ - DOSED IN MG/24 HR) 7 mg/24hr patch Apply 7mg  patch one daily for 2 weeks. Remove old patch before applying new one. 14 patch 0   nystatin (MYCOSTATIN/NYSTOP) powder Apply 1 Application topically 3 (three) times daily. 30 g 0   ondansetron (ZOFRAN-ODT) 4 MG disintegrating tablet Take by mouth.     oxyCODONE-acetaminophen (PERCOCET/ROXICET) 5-325 MG tablet Take 1 tablet  by mouth every 6 (six) hours as needed for severe pain.     senna (SENOKOT) 8.6 MG TABS tablet Take 1 tablet (8.6 mg total) by mouth daily as needed for mild constipation. 120 tablet 0   sertraline (ZOLOFT) 25 MG tablet Take by mouth.     sevelamer carbonate (RENVELA) 800 MG tablet Take 4 tablets (3,200 mg total) by mouth 3 (three) times daily with meals. 180 tablet 5   sildenafil (VIAGRA) 50 MG tablet Take 50 mg by mouth daily as needed for erectile dysfunction.     SUMAtriptan (IMITREX) 50 MG tablet Take by mouth.     valACYclovir (VALTREX) 500 MG tablet Take 1 tablet (500 mg total) by mouth daily. 30 tablet 0   diphenhydrAMINE-APAP, sleep, (APAP-DIPHENHYDRAMINE PO) Take by mouth.     iron sucrose (VENOFER) 20 MG/ML injection 50 mg.     Current Facility-Administered Medications  Medication Dose Route Frequency Provider Last Rate Last Admin   sodium chloride flush (NS) 0.9 % injection 3 mL  3 mL Intravenous PRN Marty Heck, MD        Review of Systems  Constitutional:  Constitutional negative. HENT: HENT negative.  Eyes: Eyes negative.  Respiratory: Respiratory negative.  Cardiovascular: Cardiovascular negative.  GI: Gastrointestinal negative.  Musculoskeletal: Musculoskeletal negative.  Skin: Skin negative.  Neurological: Neurological  negative. Hematologic: Hematologic/lymphatic negative.  Psychiatric: Psychiatric negative.        Objective:  Objective   Vitals:   07/29/22 1539  BP: (!) 165/76  Pulse: 64  Resp: 20  Temp: 98.4 F (36.9 C)  SpO2: 96%  Weight: 124 lb 4.8 oz (56.4 kg)  Height: 5\' 5"  (1.651 m)   Body mass index is 20.68 kg/m.  Physical Exam HENT:     Head: Normocephalic.     Nose: Nose normal.  Eyes:     Pupils: Pupils are equal, round, and reactive to light.  Cardiovascular:     Rate and Rhythm: Normal rate.     Pulses:          Radial pulses are 0 on the right side and 2+ on the left side.  Pulmonary:     Effort: Pulmonary effort is normal.  Abdominal:     General: Abdomen is flat.     Palpations: Abdomen is soft.  Musculoskeletal:     Cervical back: Normal range of motion.     Comments: Thinned skin overlying right arm avf currently with oozing from recent hd  Neurological:     General: No focal deficit present.     Mental Status: He is alert.  Psychiatric:        Mood and Affect: Mood normal.     Data: No studies today     Assessment/Plan:    66 year old male history of end-stage renal disease on dialysis via right arm fistula which has thinned skin and has had some oozing after dialysis.  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  I discussed the risk benefits alternatives he demonstrates good understanding we will get this plan on a nondialysis day in the near future.     Waynetta Sandy MD [facility]

## 2022-07-29 NOTE — Progress Notes (Signed)
Patient ID: Maurice Bell, male   DOB: Apr 28, 1956, 66 y.o.   MRN: [id number]  Reason for Consult: Follow-up   Referred by Elmarie Shiley, MD  Subjective:     HPI:  Maurice Bell is a 66 y.o. male history of end-stage renal disease on dialysis via right arm AV fistula.  He has had some excess bleeding from the fistula and is using his dialysis site today from yesterday.  He recently had fistulogram performed by Dr. Augustin Coupe states that the flow has been better.  He does not have any right upper extremity pain today.  Past Medical History:  Diagnosis Date   Anemia    Anxiety    Arthritis    Depression    Dyspnea    "only when I have too much fluid"   ESRD (end stage renal disease) on dialysis ([place])    "[place] [place]; TTS" (10/06/2018)   GERD (gastroesophageal reflux disease)    Hepatitis C    "never treated"    History of anemia due to CKD    Hypertension    Myoclonic jerking    Pneumonia    Renal disorder    Stroke (HCC)    bilat leg weakness residual- "years ago" - has weakness at times   Wears glasses    Wears partial dentures    lower   Family History  Problem Relation Age of Onset   Hypertension Mother    Diabetes Mother    Hypertension Father    Diabetes Father    Hypertension Son    High blood pressure Other    Past Surgical History:  Procedure Laterality Date   A/V FISTULAGRAM N/A 03/24/2019   Procedure: A/V FISTULAGRAM - right arm;  Surgeon: Angelia Mould, MD;  Location: [place] [facility];  Service: Cardiovascular;  Laterality: N/A;   A/V FISTULAGRAM Right 10/20/2021   Procedure: A/V FISTULAGRAM;  Surgeon: Waynetta Sandy, MD;  Location: [place] [facility];  Service: Cardiovascular;  Laterality: Right;   ANTERIOR CERVICAL DECOMPRESSION/DISCECTOMY FUSION 4 LEVEL/HARDWARE REMOVAL N/A 05/15/2019   Procedure: REMOVAL OF C3-C7 PLATE, REVISION C3-4 FUSION WITH PARTIAL CORPECTOMY AT C3 AND REPLATING;  Surgeon: Marybelle Killings, MD;  Location: [place];   Service: Orthopedics;  Laterality: N/A;   ANTERIOR CERVICAL DECOMPRESSION/DISCECTOMY FUSION 4 LEVELS N/A 05/03/2019   Procedure: C3-4, C4-5, C5-6, C6-7 ANTERIOR CERVICAL DECOMPRESSION/DISCECTOMY FUSION, ALLOGRAFTS & PLATE;  Surgeon: Marybelle Killings, MD;  Location: [place];  Service: Orthopedics;  Laterality: N/A;   AV FISTULA PLACEMENT Bilateral    "right side not working anymore" (10/06/2018)   AV FISTULA PLACEMENT Right 02/15/2019   Procedure: Creation of arteriovenous fistula, right arm;  Surgeon: Serafina Mitchell, MD;  Location: [place];  Service: Vascular;  Laterality: Right;   [place] Right 03/29/2019   Procedure: SECOND STAGE BASILIC VEIN TRANSPOSITION RIGHT ARM;  Surgeon: Serafina Mitchell, MD;  Location: [place];  Service: Vascular;  Laterality: Right;   CATARACT EXTRACTION Bilateral    COLON RESECTION     from [place]   COLONOSCOPY W/ BIOPSIES AND POLYPECTOMY     HERNIA REPAIR     INSERTION OF DIALYSIS CATHETER Left 11/07/2018   Procedure: INSERTION OF TUNNELED DIALYSIS CATHETER - LEFT INTERNAL JUGULAR PLACEMENT;  Surgeon: Angelia Mould, MD;  Location: [place];  Service: Vascular;  Laterality: Left;   IR THROMBECTOMY AV FISTULA W/THROMBOLYSIS/PTA INC/SHUNT/IMG RIGHT Right 08/02/2019   IR US GUIDE VASC ACCESS RIGHT  08/02/2019   LEFT HEART  CATH AND CORONARY ANGIOGRAPHY N/A 11/09/2019   Procedure: LEFT HEART CATH AND CORONARY ANGIOGRAPHY;  Surgeon: Jettie Booze, MD;  Location: [place] [facility];  Service: Cardiovascular;  Laterality: N/A;   PERIPHERAL VASCULAR INTERVENTION Right 10/20/2021   Procedure: PERIPHERAL VASCULAR INTERVENTION;  Surgeon: Waynetta Sandy, MD;  Location: [place] [facility];  Service: Cardiovascular;  Laterality: Right;   POSTERIOR CERVICAL FUSION/FORAMINOTOMY N/A 05/15/2019   Procedure: POSTERIOR CERVICAL FUSION/FORAMINOTOMY C3-7 WITH LATERAL MASS INSTRUMENTATION;  Surgeon: Marybelle Killings, MD;  Location: [place];  Service:  Orthopedics;  Laterality: N/A;   TONSILLECTOMY     UMBILICAL HERNIA REPAIR      Short Social History:  Social History   Tobacco Use   Smoking status: Every Day    Packs/day: 0.25    Types: Cigarettes    Last attempt to quit: 10/02/2020    Years since quitting: 1.8   Smokeless tobacco: Never   Tobacco comments:    patient refused.   Substance Use Topics   Alcohol use: Not Currently    Alcohol/week: 1.0 standard drink of alcohol    Types: 1 Standard drinks or equivalent per week    Allergies  Allergen Reactions   Vicodin [Hydrocodone-Acetaminophen] Itching    Current Outpatient Medications  Medication Sig Dispense Refill   acetaminophen (TYLENOL) 650 MG CR tablet Take 1,300 mg by mouth every 8 (eight) hours as needed for pain.     alprostadil (EDEX) 20 MCG injection by Intracavitary route.     amitriptyline (ELAVIL) 10 MG tablet Take by mouth.     amLODipine (NORVASC) 10 MG tablet Take 10 mg by mouth daily.     aspirin EC 81 MG tablet Take 81 mg by mouth daily.     B Complex-C-Folic Acid (NEPHRO-VITE PO) Take 0.8 mg by mouth.     buprenorphine (BUTRANS) 5 MCG/HR PTWK 1 patch once a week.     ciclopirox (PENLAC) 8 % solution Apply topically at bedtime. Apply over nail and surrounding skin. Apply daily over previous coat. After seven (7) days, may remove with alcohol and continue cycle. 6.6 mL 2   doxycycline (VIBRA-TABS) 100 MG tablet Take 1 tablet (100 mg total) by mouth 2 (two) times daily. 20 tablet 0   hydrALAZINE (APRESOLINE) 100 MG tablet Take 100 mg by mouth 2 (two) times daily.     hydrocortisone (ANUSOL-HC) 2.5 % rectal cream APPLY TO AFFECTED AREA 3 TIMES A DAY     Menthol-Methyl Salicylate (ICY HOT) 99-37 % STCK Apply 1 application topically daily as needed (joint pain).     metoprolol tartrate (LOPRESSOR) 50 MG tablet Take 150 mg by mouth 2 (two) times daily.     mirtazapine (REMERON) 7.5 MG tablet Take 7.5 mg by mouth at bedtime.     multivitamin (RENA-VIT) TABS  tablet Take 1 tablet by mouth at bedtime. 30 tablet 3   nicotine (NICODERM CQ - DOSED IN MG/24 HOURS) 14 mg/24hr patch Apply 14mg  patch one daily for 6 weeks, then change to 14mg  patch. Remove old patch before applying new one. 42 patch 0   nicotine (NICODERM CQ - DOSED IN MG/24 HR) 7 mg/24hr patch Apply 7mg  patch one daily for 2 weeks. Remove old patch before applying new one. 14 patch 0   nystatin (MYCOSTATIN/NYSTOP) powder Apply 1 Application topically 3 (three) times daily. 30 g 0   ondansetron (ZOFRAN-ODT) 4 MG disintegrating tablet Take by mouth.     oxyCODONE-acetaminophen (PERCOCET/ROXICET) 5-325 MG tablet Take 1 tablet  by mouth every 6 (six) hours as needed for severe pain.     senna (SENOKOT) 8.6 MG TABS tablet Take 1 tablet (8.6 mg total) by mouth daily as needed for mild constipation. 120 tablet 0   sertraline (ZOLOFT) 25 MG tablet Take by mouth.     sevelamer carbonate (RENVELA) 800 MG tablet Take 4 tablets (3,200 mg total) by mouth 3 (three) times daily with meals. 180 tablet 5   sildenafil (VIAGRA) 50 MG tablet Take 50 mg by mouth daily as needed for erectile dysfunction.     SUMAtriptan (IMITREX) 50 MG tablet Take by mouth.     valACYclovir (VALTREX) 500 MG tablet Take 1 tablet (500 mg total) by mouth daily. 30 tablet 0   diphenhydrAMINE-APAP, sleep, (APAP-DIPHENHYDRAMINE PO) Take by mouth.     iron sucrose (VENOFER) 20 MG/ML injection 50 mg.     Current Facility-Administered Medications  Medication Dose Route Frequency Provider Last Rate Last Admin   sodium chloride flush (NS) 0.9 % injection 3 mL  3 mL Intravenous PRN Marty Heck, MD        Review of Systems  Constitutional:  Constitutional negative. HENT: HENT negative.  Eyes: Eyes negative.  Respiratory: Respiratory negative.  Cardiovascular: Cardiovascular negative.  GI: Gastrointestinal negative.  Musculoskeletal: Musculoskeletal negative.  Skin: Skin negative.  Neurological: Neurological  negative. Hematologic: Hematologic/lymphatic negative.  Psychiatric: Psychiatric negative.        Objective:  Objective   Vitals:   07/29/22 1539  BP: (!) 165/76  Pulse: 64  Resp: 20  Temp: 98.4 F (36.9 C)  SpO2: 96%  Weight: 124 lb 4.8 oz (56.4 kg)  Height: 5\' 5"  (1.651 m)   Body mass index is 20.68 kg/m.  Physical Exam HENT:     Head: Normocephalic.     Nose: Nose normal.  Eyes:     Pupils: Pupils are equal, round, and reactive to light.  Cardiovascular:     Rate and Rhythm: Normal rate.     Pulses:          Radial pulses are 0 on the right side and 2+ on the left side.  Pulmonary:     Effort: Pulmonary effort is normal.  Abdominal:     General: Abdomen is flat.     Palpations: Abdomen is soft.  Musculoskeletal:     Cervical back: Normal range of motion.     Comments: Thinned skin overlying right arm avf currently with oozing from recent hd  Neurological:     General: No focal deficit present.     Mental Status: He is alert.  Psychiatric:        Mood and Affect: Mood normal.     Data: No studies today     Assessment/Plan:    66 year old male history of end-stage renal disease on dialysis via right arm fistula which has thinned skin and has had some oozing after dialysis.  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  I discussed the risk benefits alternatives he demonstrates good understanding we will get this plan on a nondialysis day in the near future.     Waynetta Sandy MD [facility]

## 2022-07-31 ENCOUNTER — Other Ambulatory Visit: Payer: Self-pay

## 2022-07-31 DIAGNOSIS — N186 End stage renal disease: Secondary | ICD-10-CM

## 2022-08-19 ENCOUNTER — Encounter (HOSPITAL_COMMUNITY): Payer: Self-pay | Admitting: Vascular Surgery

## 2022-08-21 ENCOUNTER — Other Ambulatory Visit: Payer: Self-pay

## 2022-08-21 ENCOUNTER — Ambulatory Visit (HOSPITAL_BASED_OUTPATIENT_CLINIC_OR_DEPARTMENT_OTHER): Payer: Medicare Other | Admitting: Physician Assistant

## 2022-08-21 ENCOUNTER — Ambulatory Visit (HOSPITAL_COMMUNITY)
Admission: RE | Admit: 2022-08-21 | Discharge: 2022-08-21 | Disposition: A | Discharge status: Home / Self Care | Payer: Medicare Other | Attending: Vascular Surgery | Admitting: Vascular Surgery

## 2022-08-21 ENCOUNTER — Ambulatory Visit (HOSPITAL_COMMUNITY): Payer: Medicare Other | Admitting: Physician Assistant

## 2022-08-21 ENCOUNTER — Ambulatory Visit (HOSPITAL_COMMUNITY): Payer: Medicare Other

## 2022-08-21 ENCOUNTER — Ambulatory Visit: Payer: Medicare Other | Admitting: Podiatry

## 2022-08-21 ENCOUNTER — Encounter (HOSPITAL_COMMUNITY): Payer: Self-pay | Admitting: Vascular Surgery

## 2022-08-21 ENCOUNTER — Telehealth: Payer: Self-pay | Admitting: Physician Assistant

## 2022-08-21 ENCOUNTER — Encounter (HOSPITAL_COMMUNITY)
Admission: RE | Disposition: A | Discharge status: Home / Self Care | Payer: Self-pay | Source: Home / Self Care | Attending: Vascular Surgery

## 2022-08-21 DIAGNOSIS — F32A Depression, unspecified: Secondary | ICD-10-CM | POA: Insufficient documentation

## 2022-08-21 DIAGNOSIS — M1990 Unspecified osteoarthritis, unspecified site: Secondary | ICD-10-CM | POA: Diagnosis not present

## 2022-08-21 DIAGNOSIS — D759 Disease of blood and blood-forming organs, unspecified: Secondary | ICD-10-CM | POA: Insufficient documentation

## 2022-08-21 DIAGNOSIS — F17210 Nicotine dependence, cigarettes, uncomplicated: Secondary | ICD-10-CM | POA: Insufficient documentation

## 2022-08-21 DIAGNOSIS — Z992 Dependence on renal dialysis: Secondary | ICD-10-CM

## 2022-08-21 DIAGNOSIS — T82838A Hemorrhage of vascular prosthetic devices, implants and grafts, initial encounter: Secondary | ICD-10-CM | POA: Diagnosis present

## 2022-08-21 DIAGNOSIS — N186 End stage renal disease: Secondary | ICD-10-CM

## 2022-08-21 DIAGNOSIS — Z79891 Long term (current) use of opiate analgesic: Secondary | ICD-10-CM | POA: Insufficient documentation

## 2022-08-21 DIAGNOSIS — I120 Hypertensive chronic kidney disease with stage 5 chronic kidney disease or end stage renal disease: Secondary | ICD-10-CM | POA: Insufficient documentation

## 2022-08-21 DIAGNOSIS — Z8673 Personal history of transient ischemic attack (TIA), and cerebral infarction without residual deficits: Secondary | ICD-10-CM | POA: Insufficient documentation

## 2022-08-21 DIAGNOSIS — D649 Anemia, unspecified: Secondary | ICD-10-CM | POA: Insufficient documentation

## 2022-08-21 DIAGNOSIS — T82898A Other specified complication of vascular prosthetic devices, implants and grafts, initial encounter: Secondary | ICD-10-CM | POA: Diagnosis not present

## 2022-08-21 DIAGNOSIS — Y713 Surgical instruments, materials and cardiovascular devices (including sutures) associated with adverse incidents: Secondary | ICD-10-CM | POA: Diagnosis not present

## 2022-08-21 HISTORY — DX: Unspecified hearing loss, bilateral: H91.93

## 2022-08-21 HISTORY — DX: Male erectile dysfunction, unspecified: N52.9

## 2022-08-21 HISTORY — PX: REVISON OF ARTERIOVENOUS FISTULA: SHX6074

## 2022-08-21 HISTORY — PX: INSERTION OF DIALYSIS CATHETER: SHX1324

## 2022-08-21 LAB — POCT I-STAT, CHEM 8
BUN: 31 mg/dL — ABNORMAL HIGH (ref 8–23)
Calcium, Ion: 1.03 mmol/L — ABNORMAL LOW (ref 1.15–1.40)
Chloride: 99 mmol/L (ref 98–111)
Creatinine, Ser: 7.9 mg/dL — ABNORMAL HIGH (ref 0.61–1.24)
Glucose, Bld: 88 mg/dL (ref 70–99)
HCT: 36 % — ABNORMAL LOW (ref 39.0–52.0)
Hemoglobin: 12.2 g/dL — ABNORMAL LOW (ref 13.0–17.0)
Potassium: 4.4 mmol/L (ref 3.5–5.1)
Sodium: 139 mmol/L (ref 135–145)
TCO2: 32 mmol/L (ref 22–32)

## 2022-08-21 SURGERY — REVISON OF ARTERIOVENOUS FISTULA
Anesthesia: General | Site: Neck | Laterality: Right

## 2022-08-21 MED ORDER — FENTANYL CITRATE (PF) 250 MCG/5ML IJ SOLN
INTRAMUSCULAR | Status: AC
  Filled 2022-08-21: qty 5

## 2022-08-21 MED ORDER — ORAL CARE MOUTH RINSE: 15.0000 mL | Freq: Once | OROMUCOSAL | Status: AC

## 2022-08-21 MED ORDER — CHLORHEXIDINE GLUCONATE 4 % EX LIQD: 60.0000 mL | Freq: Once | CUTANEOUS | Status: DC

## 2022-08-21 MED ORDER — OXYCODONE HCL 5 MG PO TABS: 5.0000 mg | ORAL_TABLET | Freq: Once | ORAL | Status: DC | PRN

## 2022-08-21 MED ORDER — HEPARIN SODIUM (PORCINE) 1000 UNIT/ML IJ SOLN
INTRAMUSCULAR | Status: DC | PRN
  Administered 2022-08-21: 3800 [IU]

## 2022-08-21 MED ORDER — PROPOFOL 10 MG/ML IV BOLUS
INTRAVENOUS | Status: AC
  Filled 2022-08-21: qty 20

## 2022-08-21 MED ORDER — AMLODIPINE BESYLATE 5 MG PO TABS
10.0000 mg | ORAL_TABLET | Freq: Once | ORAL | Status: AC
  Administered 2022-08-21: 10 mg via ORAL
  Filled 2022-08-21: qty 2

## 2022-08-21 MED ORDER — FENTANYL CITRATE (PF) 100 MCG/2ML IJ SOLN
25.0000 ug | INTRAMUSCULAR | Status: DC | PRN
  Administered 2022-08-21: 50 ug via INTRAVENOUS

## 2022-08-21 MED ORDER — CHLORHEXIDINE GLUCONATE 0.12 % MT SOLN
15.0000 mL | Freq: Once | OROMUCOSAL | Status: AC
  Administered 2022-08-21: 15 mL via OROMUCOSAL
  Filled 2022-08-21: qty 15

## 2022-08-21 MED ORDER — FENTANYL CITRATE (PF) 100 MCG/2ML IJ SOLN
INTRAMUSCULAR | Status: AC
  Filled 2022-08-21: qty 2

## 2022-08-21 MED ORDER — MIDAZOLAM HCL 2 MG/2ML IJ SOLN
INTRAMUSCULAR | Status: AC
  Filled 2022-08-21: qty 2

## 2022-08-21 MED ORDER — HEPARIN 6000 UNIT IRRIGATION SOLUTION
Status: AC
  Filled 2022-08-21: qty 500

## 2022-08-21 MED ORDER — CEFAZOLIN SODIUM-DEXTROSE 2-4 GM/100ML-% IV SOLN
2.0000 g | INTRAVENOUS | Status: AC
  Administered 2022-08-21: 2 g via INTRAVENOUS
  Filled 2022-08-21: qty 100

## 2022-08-21 MED ORDER — METOPROLOL TARTRATE 50 MG PO TABS
150.0000 mg | ORAL_TABLET | Freq: Once | ORAL | Status: AC
  Administered 2022-08-21: 150 mg via ORAL
  Filled 2022-08-21: qty 3

## 2022-08-21 MED ORDER — MIDAZOLAM HCL 2 MG/2ML IJ SOLN
INTRAMUSCULAR | Status: DC | PRN
  Administered 2022-08-21: 1 mg via INTRAVENOUS

## 2022-08-21 MED ORDER — 0.9 % SODIUM CHLORIDE (POUR BTL) OPTIME
TOPICAL | Status: DC | PRN
  Administered 2022-08-21: 1000 mL

## 2022-08-21 MED ORDER — FENTANYL CITRATE (PF) 250 MCG/5ML IJ SOLN
INTRAMUSCULAR | Status: DC | PRN
  Administered 2022-08-21: 50 ug via INTRAVENOUS
  Administered 2022-08-21: 150 ug via INTRAVENOUS
  Administered 2022-08-21: 50 ug via INTRAVENOUS

## 2022-08-21 MED ORDER — ACETAMINOPHEN 10 MG/ML IV SOLN
INTRAVENOUS | Status: AC
  Filled 2022-08-21: qty 100

## 2022-08-21 MED ORDER — HYDRALAZINE HCL 50 MG PO TABS
100.0000 mg | ORAL_TABLET | ORAL | Status: DC
  Filled 2022-08-21: qty 2

## 2022-08-21 MED ORDER — ACETAMINOPHEN 10 MG/ML IV SOLN: 1000.0000 mg | Freq: Once | INTRAVENOUS | Status: DC | PRN

## 2022-08-21 MED ORDER — SUCCINYLCHOLINE CHLORIDE 200 MG/10ML IV SOSY
PREFILLED_SYRINGE | INTRAVENOUS | Status: DC | PRN
  Administered 2022-08-21: 60 mg via INTRAVENOUS

## 2022-08-21 MED ORDER — HEPARIN 6000 UNIT IRRIGATION SOLUTION
Status: DC | PRN
  Administered 2022-08-21: 1

## 2022-08-21 MED ORDER — PROPOFOL 10 MG/ML IV BOLUS
INTRAVENOUS | Status: DC | PRN
  Administered 2022-08-21: 20 mg via INTRAVENOUS
  Administered 2022-08-21: 200 mg via INTRAVENOUS

## 2022-08-21 MED ORDER — HEPARIN SODIUM (PORCINE) 1000 UNIT/ML IJ SOLN
INTRAMUSCULAR | Status: AC
  Filled 2022-08-21: qty 10

## 2022-08-21 MED ORDER — ONDANSETRON HCL 4 MG/2ML IJ SOLN
INTRAMUSCULAR | Status: DC | PRN
  Administered 2022-08-21: 4 mg via INTRAVENOUS

## 2022-08-21 MED ORDER — PHENYLEPHRINE HCL-NACL 20-0.9 MG/250ML-% IV SOLN
INTRAVENOUS | Status: DC | PRN
  Administered 2022-08-21: 50 ug/min via INTRAVENOUS

## 2022-08-21 MED ORDER — OXYCODONE HCL 5 MG/5ML PO SOLN: 5.0000 mg | Freq: Once | ORAL | Status: DC | PRN

## 2022-08-21 MED ORDER — SODIUM CHLORIDE 0.9 % IV SOLN: INTRAVENOUS | Status: DC

## 2022-08-21 MED ORDER — DEXAMETHASONE SODIUM PHOSPHATE 10 MG/ML IJ SOLN
INTRAMUSCULAR | Status: DC | PRN
  Administered 2022-08-21: 10 mg via INTRAVENOUS

## 2022-08-21 MED ORDER — PROMETHAZINE HCL 25 MG/ML IJ SOLN: 6.2500 mg | INTRAMUSCULAR | Status: DC | PRN

## 2022-08-21 MED ORDER — LIDOCAINE 2% (20 MG/ML) 5 ML SYRINGE
INTRAMUSCULAR | Status: DC | PRN
  Administered 2022-08-21: 60 mg via INTRAVENOUS

## 2022-08-21 SURGICAL SUPPLY — 70 items
ADH SKN CLS APL DERMABOND .7 (GAUZE/BANDAGES/DRESSINGS) ×6
ADH SKN CLS LQ APL DERMABOND (GAUZE/BANDAGES/DRESSINGS) ×2
APL PRP STRL LF DISP 70% ISPRP (MISCELLANEOUS) ×2
ARMBAND PINK RESTRICT EXTREMIT (MISCELLANEOUS) ×2 IMPLANT
BAG COUNTER SPONGE SURGICOUNT (BAG) ×2 IMPLANT
BAG DECANTER FOR FLEXI CONT (MISCELLANEOUS) ×2 IMPLANT
BAG SPNG CNTER NS LX DISP (BAG) ×2
BIOPATCH BLUE 3/4IN DISK W/1.5 (GAUZE/BANDAGES/DRESSINGS) IMPLANT
BIOPATCH RED 1 DISK 7.0 (GAUZE/BANDAGES/DRESSINGS) ×2 IMPLANT
BNDG CMPR 9X4 STRL LF SNTH (GAUZE/BANDAGES/DRESSINGS) ×2
BNDG ELASTIC 4X5.8 VLCR STR LF (GAUZE/BANDAGES/DRESSINGS) IMPLANT
BNDG ESMARK 4X9 LF (GAUZE/BANDAGES/DRESSINGS) IMPLANT
CANISTER SUCT 3000ML PPV (MISCELLANEOUS) ×2 IMPLANT
CATH PALINDROME-P 19CM W/VT (CATHETERS) IMPLANT
CATH PALINDROME-P 23CM W/VT (CATHETERS) IMPLANT
CATH PALINDROME-P 28CM W/VT (CATHETERS) IMPLANT
CHLORAPREP W/TINT 26 (MISCELLANEOUS) IMPLANT
CLIP LIGATING EXTRA MED SLVR (CLIP) ×2 IMPLANT
CLIP LIGATING EXTRA SM BLUE (MISCELLANEOUS) ×2 IMPLANT
COVER PROBE W GEL 5X96 (DRAPES) ×2 IMPLANT
COVER SURGICAL LIGHT HANDLE (MISCELLANEOUS) ×2 IMPLANT
CUFF TOURN SGL QUICK 18 NS (TOURNIQUET CUFF) IMPLANT
DERMABOND ADVANCED .7 DNX12 (GAUZE/BANDAGES/DRESSINGS) ×2 IMPLANT
DERMABOND ADVANCED .7 DNX6 (GAUZE/BANDAGES/DRESSINGS) IMPLANT
DRAPE C-ARM 42X72 X-RAY (DRAPES) ×2 IMPLANT
DRAPE CHEST BREAST 15X10 FENES (DRAPES) ×2 IMPLANT
DRAPE ORTHO SPLIT 77X108 STRL (DRAPES) ×4
DRAPE SURG ORHT 6 SPLT 77X108 (DRAPES) IMPLANT
DRSG COVADERM 4X6 (GAUZE/BANDAGES/DRESSINGS) IMPLANT
ELECT REM PT RETURN 9FT ADLT (ELECTROSURGICAL) ×2
ELECTRODE REM PT RTRN 9FT ADLT (ELECTROSURGICAL) ×2 IMPLANT
GAUZE 4X4 16PLY ~~LOC~~+RFID DBL (SPONGE) ×2 IMPLANT
GAUZE SPONGE 4X4 12PLY STRL (GAUZE/BANDAGES/DRESSINGS) IMPLANT
GLIDEWIRE ADV .035X180CM (WIRE) IMPLANT
GLOVE BIO SURGEON STRL SZ7.5 (GLOVE) ×2 IMPLANT
GOWN STRL REUS W/ TWL LRG LVL3 (GOWN DISPOSABLE) ×4 IMPLANT
GOWN STRL REUS W/ TWL XL LVL3 (GOWN DISPOSABLE) ×2 IMPLANT
GOWN STRL REUS W/TWL LRG LVL3 (GOWN DISPOSABLE) ×4
GOWN STRL REUS W/TWL XL LVL3 (GOWN DISPOSABLE) ×2
KIT BASIN OR (CUSTOM PROCEDURE TRAY) ×2 IMPLANT
KIT PALINDROME-P 55CM (CATHETERS) IMPLANT
KIT TURNOVER KIT B (KITS) ×2 IMPLANT
NDL 18GX1X1/2 (RX/OR ONLY) (NEEDLE) ×2 IMPLANT
NDL HYPO 25GX1X1/2 BEV (NEEDLE) ×2 IMPLANT
NEEDLE 18GX1X1/2 (RX/OR ONLY) (NEEDLE) ×2 IMPLANT
NEEDLE HYPO 25GX1X1/2 BEV (NEEDLE) ×2 IMPLANT
NS IRRIG 1000ML POUR BTL (IV SOLUTION) ×2 IMPLANT
PACK BASIC III (CUSTOM PROCEDURE TRAY) ×2
PACK CV ACCESS (CUSTOM PROCEDURE TRAY) ×2 IMPLANT
PACK SRG BSC III STRL LF ECLPS (CUSTOM PROCEDURE TRAY) ×2 IMPLANT
PAD ARMBOARD 7.5X6 YLW CONV (MISCELLANEOUS) ×4 IMPLANT
POWDER SURGICEL 3.0 GRAM (HEMOSTASIS) IMPLANT
SHEATH MICROPUNCTURE PEDAL 4FR (SHEATH) IMPLANT
SLING ARM FOAM STRAP LRG (SOFTGOODS) IMPLANT
SLING ARM FOAM STRAP MED (SOFTGOODS) IMPLANT
SOAP 2 % CHG 4 OZ (WOUND CARE) ×2 IMPLANT
SUT ETHILON 3 0 PS 1 (SUTURE) ×2 IMPLANT
SUT MNCRL AB 4-0 PS2 18 (SUTURE) ×2 IMPLANT
SUT PROLENE 4 0 SH DA (SUTURE) IMPLANT
SUT PROLENE 6 0 BV (SUTURE) ×2 IMPLANT
SUT VIC AB 3-0 SH 27 (SUTURE) ×2
SUT VIC AB 3-0 SH 27X BRD (SUTURE) ×2 IMPLANT
SYR 10ML LL (SYRINGE) ×2 IMPLANT
SYR 20ML LL LF (SYRINGE) ×4 IMPLANT
SYR 5ML LL (SYRINGE) ×2 IMPLANT
SYR CONTROL 10ML LL (SYRINGE) ×2 IMPLANT
TOWEL GREEN STERILE (TOWEL DISPOSABLE) ×2 IMPLANT
TOWEL GREEN STERILE FF (TOWEL DISPOSABLE) ×4 IMPLANT
UNDERPAD 30X36 HEAVY ABSORB (UNDERPADS AND DIAPERS) ×2 IMPLANT
WATER STERILE IRR 1000ML POUR (IV SOLUTION) ×2 IMPLANT

## 2022-08-21 NOTE — Anesthesia Preprocedure Evaluation (Addendum)
Anesthesia Evaluation  Patient identified by MRN, date of birth, ID band Patient awake    Reviewed: Allergy & Precautions, NPO status , Patient's Chart, lab work & pertinent test results  Airway Mallampati: III  TM Distance: >3 FB Neck ROM: Full    Dental  (+) Missing   Pulmonary Current Smoker and Patient abstained from smoking.   Pulmonary exam normal        Cardiovascular hypertension, Pt. on medications and Pt. on home beta blockers Normal cardiovascular exam     Neuro/Psych  PSYCHIATRIC DISORDERS Anxiety Depression    CVA    GI/Hepatic negative GI ROS,,,(+)     substance abuse  , Hepatitis -  Endo/Other  negative endocrine ROS    Renal/GU ESRF and DialysisRenal disease (On HD T, R, Sat)K: 4.4     Musculoskeletal  (+) Arthritis ,  narcotic dependent  Abdominal   Peds  Hematology  (+) Blood dyscrasia, anemia   Anesthesia Other Findings ESRD  Reproductive/Obstetrics                             Anesthesia Physical Anesthesia Plan  ASA: 3  Anesthesia Plan: General   Post-op Pain Management:    Induction: Intravenous  PONV Risk Score and Plan: 1 and Ondansetron, Dexamethasone and Treatment may vary due to age or medical condition  Airway Management Planned: Oral ETT  Additional Equipment:   Intra-op Plan:   Post-operative Plan: Extubation in OR  Informed Consent: I have reviewed the patients History and Physical, chart, labs and discussed the procedure including the risks, benefits and alternatives for the proposed anesthesia with the patient or authorized representative who has indicated his/her understanding and acceptance.     Dental advisory given  Plan Discussed with: CRNA  Anesthesia Plan Comments:        Anesthesia Quick Evaluation

## 2022-08-21 NOTE — Telephone Encounter (Signed)
-----   Message from Karoline Caldwell, [place] sent at 08/21/2022 12:23 PM EDT ----- S/p plication of right AV fistula by Dr. Donzetta Matters. He needs follow up in 2-3 weeks for incision check. thanks

## 2022-08-21 NOTE — Interval H&P Note (Signed)
History and Physical Interval Note:  08/21/2022 7:29 AM  Maurice Bell  has presented today for surgery, with the diagnosis of ESRD.  The various methods of treatment have been discussed with the patient and family. After consideration of risks, benefits and other options for treatment, the patient has consented to  Procedure(s): REVISION OF RIGHT ARM ARTERIOVENOUS FISTULA (Right) INSERTION OF TUNNELED DIALYSIS CATHETER (N/A) as a surgical intervention.  The patient's history has been reviewed, patient examined, no change in status, stable for surgery.  I have reviewed the patient's chart and labs.  Questions were answered to the patient's satisfaction.     Servando Snare

## 2022-08-21 NOTE — Op Note (Signed)
[name] MRN: [id number] DOB: 06-12-1956 Sex: male  08/21/2022 Pre-operative Diagnosis: esrd, bleeding left arm avf  Post-operative diagnosis:  Same Surgeon:  Eda Paschal. Donzetta Matters, MD Assistant: Paulo Fruit, PA Procedure Performed: 1.  Left IJ 23cm tunneled dialysis catheter with fluoroscopic and ultrasound guidance 2.  Revision of right arm av fistula with plication of pseudoaneurysms  Indications: 66 year old male with history of end-stage renal disease now with bleeding from his right arm AV fistula he is indicated for revision and also placement of tunneled dialysis catheter to allow the fistula to heal.  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  The right arm AV fistula was primarily plicated and the skin was closed over top.   Procedure:  The patient was identified in the holding area and taken to the operating room was placed supine on the operative table and general anesthesia was induced.  He was sterilely prepped and draped in the bilateral neck and chest as well as the right upper extremity usual fashion, antibiotics were minister timeout was called.  We began using ultrasound to first identify the right internal jugular vein this appeared to be chronically occluded I then identified the left internal jugular vein which was patent and compressible and this was cannulated with micropuncture needle and a wire was passed centrally under fluoroscopic guidance although with some difficulty and a micropuncture sheath was placed.  I attempted to place the J-wire from 23 cm catheter centrally but this would not turn the band down to the SVC and thus short Glidewire advantage was used I was able to get this into the IVC under fluoroscopic guidance and then serially dilated the wire tract.  A 23 cm catheter was tunneled from a  counterincision and introducer sheath was placed over the wire and the catheter was placed to the level of the right atrium.  The catheter was affixed to the skin with 3-0 nylon and the neck incision was closed with 4 Monocryl and Dermabond was placed at both sites and a sterile dressing was applied.  Catheter was locked with 1.9 cc of concentrated heparin in both ports.  Attention was then turned to the right upper extremity which had previously been prepped and draped.  A tourniquet was placed above the fistula near the shoulder the arm was exsanguinated with Esmarch and the tourniquet was inflated to 250 mmHg.  Elliptical type incision was made around the 2 areas of ulceration on the pseudoaneurysms and we excised the skin as well as the underlying fistula.  The fistula was then freed up from the surrounding subcutaneous tissue and primarily repaired with running 4-0 Monocryl in a mattress fashion.  Prior to completing the anastomosis we thoroughly flushed the fistula and completed the anastomosis there was a very strong thrill and still a palpable radial pulse at the wrist.  Satisfied with this we irrigated the wound and obtain hemostasis and closed in layers with Vicryl and Monocryl.  Dermabond was placed at the skin level.  Patient was awakened from anesthesia having tolerated procedure well without any complication.  All counts were correct at completion.   Given the complexity of the case,  the assistant was necessary in order to expedient the procedure and safely perform the technical aspects of the operation.  The assistant provided traction and countertraction to assist with exposure of the artery and vein.  They played a critical role in the anastomosis. These skills, especially following the Prolene suture for the anastomosis, could not have been adequately performed by a scrub tech assistant.    EBL: 100cc  Shakela Donati C. Donzetta Matters, MD [facility] Office:  [phone] Pager: [phone]

## 2022-08-21 NOTE — Transfer of Care (Signed)
Immediate Anesthesia Transfer of Care Note  Patient: Maurice Bell  Procedure(s) Performed: REVISION OF RIGHT ARM ARTERIOVENOUS FISTULA (Right: Arm Upper) INSERTION OF RIGHT INTERNAL JUGULAR TUNNELED DIALYSIS CATHETER (Right: Neck)  Patient Location: PACU  Anesthesia Type:General  Level of Consciousness: awake, alert , and sedated  Airway & Oxygen Therapy: Patient Spontanous Breathing  Post-op Assessment: Report given to RN and Post -op Vital signs reviewed and stable  Post vital signs: stable  Last Vitals:  Vitals Value Taken Time  BP    Temp    Pulse    Resp    SpO2      Last Pain:  Vitals:   08/21/22 0715  TempSrc:   PainSc: 3          Complications: No notable events documented.

## 2022-08-22 NOTE — Anesthesia Postprocedure Evaluation (Signed)
Anesthesia Post Note  Patient: Maurice Bell  Procedure(s) Performed: REVISION OF RIGHT ARM ARTERIOVENOUS FISTULA (Right: Arm Upper) INSERTION OF RIGHT INTERNAL JUGULAR TUNNELED DIALYSIS CATHETER (Right: Neck)     Patient location during evaluation: PACU Anesthesia Type: General Level of consciousness: awake Pain management: pain level controlled Vital Signs Assessment: post-procedure vital signs reviewed and stable Respiratory status: spontaneous breathing, nonlabored ventilation, respiratory function stable and patient connected to nasal cannula oxygen Cardiovascular status: blood pressure returned to baseline and stable Postop Assessment: no apparent nausea or vomiting Anesthetic complications: no   No notable events documented.  Last Vitals:  Vitals:   08/21/22 1330 08/21/22 1345  BP: (!) 178/84 (!) 174/76  Pulse: 64 61  Resp: 11 12  Temp:  (!) 36.2 C  SpO2: 94% 96%    Last Pain:  Vitals:   08/21/22 1345  TempSrc:   PainSc: Asleep                 Markiah Janeway P Dominie Benedick

## 2022-08-23 ENCOUNTER — Encounter (HOSPITAL_COMMUNITY): Payer: Self-pay | Admitting: Vascular Surgery

## 2022-08-28 ENCOUNTER — Encounter: Payer: Self-pay | Admitting: Podiatry

## 2022-08-28 ENCOUNTER — Ambulatory Visit (INDEPENDENT_AMBULATORY_CARE_PROVIDER_SITE_OTHER): Payer: Medicare Other | Admitting: Podiatry

## 2022-08-28 DIAGNOSIS — M7989 Other specified soft tissue disorders: Secondary | ICD-10-CM

## 2022-08-28 DIAGNOSIS — R609 Edema, unspecified: Secondary | ICD-10-CM | POA: Diagnosis not present

## 2022-09-01 NOTE — Progress Notes (Signed)
Subjective: Chief Complaint  Patient presents with   Foot Pain    Follow up dorsal midfoot bilateral   "The swelling and the pain is better, still a little bit of pain"    66 year old male presents above concerns.  He states he is doing much better and the swelling on top of the feet is improved.  His pain is also much better.  He is wearing regular shoes.  No recent injury or changes otherwise since saw him.  Objective: AAO x3, NAD DP/PT pulses palpable bilaterally, CRT less than 3 seconds There is still some mild localized swelling the dorsal aspect of bilateral feet but this pretty much better than the last appointment.  No erythema or warmth there is no fluctuation or crepitation but there is no malodor.  There is no other areas of discomfort noted today. No pain with calf compression, swelling, warmth, erythema  Assessment: 66 year old male with improved swelling, soft tissue mass bilaterally  Plan: -All treatment options discussed with the patient including all alternatives, risks, complications.  -Overall doing much better.  Continue shoes to avoid pressure.  Ice to the area to help with any residual edema.  At this time there is no fluid to try to aspirate today.  No signs of infection but will continue to monitor this.  There is any recurrence of anemia. -Patient encouraged to call the office with any questions, concerns, change in symptoms.   Trula Slade DPM

## 2022-09-11 LAB — METHYLMALONIC ACID, SERUM: Methylmalonic Acid, Quantitative: 1711 nmol/L — ABNORMAL HIGH (ref 0–378)

## 2022-09-16 ENCOUNTER — Ambulatory Visit (INDEPENDENT_AMBULATORY_CARE_PROVIDER_SITE_OTHER): Payer: Medicare Other | Admitting: Physician Assistant

## 2022-09-16 VITALS — BP 163/84 | HR 67 | Temp 97.7°F | Resp 20 | Ht 65.0 in | Wt 123.0 lb

## 2022-09-16 DIAGNOSIS — N186 End stage renal disease: Secondary | ICD-10-CM

## 2022-09-16 NOTE — Progress Notes (Signed)
    Postoperative Access Visit   History of Present Illness   Maurice Bell is a 66 y.o. year old male who presents for postoperative follow-up for: Left IJ TDC and revision of right arm AV fistula with plication of pseudoaneurysms by Dr. Donzetta Matters on 08/21/22.  The patient's wounds are healing well. He explains that he had some drainage for a couple days after the surgery but this has since stopped. The patient notes no steal symptoms.   He is currently dialyzing via the left IJ TDC on TTS at [facility] location.   Physical Examination   Vitals:   09/16/22 1324  BP: (!) 163/84  Pulse: 67  Resp: 20  Temp: 97.7 F (36.5 C)  TempSrc: Temporal  SpO2: 98%  Weight: 123 lb (55.8 kg)  Height: 5\' 5"  (1.651 m)   Body mass index is 20.47 kg/m.  right arm Incision is healing. Dermabond and some dried blood present along incision, 2+ radial pulse, hand grip is 5/5, sensation in digits is intact, palpable thrill, bruit can be auscultated     Medical Decision Making   Maurice Bell is a 66 y.o. year old male who presents s/p Left IJ TDC and revision of right arm AV fistula with plication of pseudoaneurysms by Dr. Donzetta Matters on 08/21/22.Incision is intact and healing well. Fistula patent with a good thrill.  Patent is without signs or symptoms of steal syndrome The patient's access will be ready for use after 10/05/22 The patient's tunneled dialysis catheter can be removed when Nephrology is comfortable with the performance of the right av fistula The patient may follow up on a prn basis   Karoline Caldwell, PA-C [facility] [place]: [phone]  Clinic MD: Yetta Barre

## 2022-10-28 ENCOUNTER — Ambulatory Visit: Payer: Medicare Other | Admitting: Podiatry

## 2022-11-09 ENCOUNTER — Ambulatory Visit (INDEPENDENT_AMBULATORY_CARE_PROVIDER_SITE_OTHER): Payer: Medicare Other | Admitting: Podiatry

## 2022-11-09 ENCOUNTER — Encounter: Payer: Self-pay | Admitting: Podiatry

## 2022-11-09 DIAGNOSIS — M79675 Pain in left toe(s): Secondary | ICD-10-CM

## 2022-11-09 DIAGNOSIS — B351 Tinea unguium: Secondary | ICD-10-CM | POA: Diagnosis not present

## 2022-11-09 DIAGNOSIS — M79674 Pain in right toe(s): Secondary | ICD-10-CM | POA: Diagnosis not present

## 2022-11-09 DIAGNOSIS — N186 End stage renal disease: Secondary | ICD-10-CM

## 2022-11-09 NOTE — Progress Notes (Signed)
This patient returns to my office for at risk foot care.  This patient requires this care by a professional since this patient will be at risk due to having  ESRD.  This patient is unable to cut nails himself since the patient cannot reach his nails.These nails are painful walking and wearing shoes.  This patient presents for at risk foot care today.  General Appearance  Alert, conversant and in no acute stress.  Vascular  Dorsalis pedis and posterior tibial  pulses are palpable  bilaterally.  Capillary return is within normal limits  bilaterally. Temperature is within normal limits  bilaterally.  Neurologic  Senn-Weinstein monofilament wire test within normal limits  bilaterally. Muscle power within normal limits bilaterally.  Nails Thick disfigured discolored nails with subungual debris  from hallux to fifth toes bilaterally. No evidence of bacterial infection or drainage bilaterally.  Orthopedic  No limitations of motion  feet .  No crepitus or effusions noted.  No bony pathology or digital deformities noted.  Skin  normotropic skin with no porokeratosis noted bilaterally.  No signs of infections or ulcers noted.     Onychomycosis  Pain in right toes  Pain in left toes  Consent was obtained for treatment procedures.   Mechanical debridement of nails 1-5  bilaterally performed with a nail nipper.  Filed with dremel without incident.    Return office visit   3 months                   Told patient to return for periodic foot care and evaluation due to potential at risk complications.   Gardiner Barefoot DPM

## 2022-11-24 ENCOUNTER — Other Ambulatory Visit: Payer: Self-pay

## 2022-11-24 ENCOUNTER — Emergency Department (HOSPITAL_COMMUNITY): Payer: 59

## 2022-11-24 ENCOUNTER — Encounter (HOSPITAL_COMMUNITY): Payer: Self-pay

## 2022-11-24 ENCOUNTER — Observation Stay (HOSPITAL_COMMUNITY)
Admission: EM | Admit: 2022-11-24 | Discharge: 2022-11-26 | Disposition: A | Discharge status: Home / Self Care | Payer: 59 | Attending: Family Medicine | Admitting: Family Medicine

## 2022-11-24 DIAGNOSIS — Z992 Dependence on renal dialysis: Secondary | ICD-10-CM | POA: Diagnosis not present

## 2022-11-24 DIAGNOSIS — D539 Nutritional anemia, unspecified: Secondary | ICD-10-CM | POA: Insufficient documentation

## 2022-11-24 DIAGNOSIS — Z79899 Other long term (current) drug therapy: Secondary | ICD-10-CM | POA: Insufficient documentation

## 2022-11-24 DIAGNOSIS — Z8673 Personal history of transient ischemic attack (TIA), and cerebral infarction without residual deficits: Secondary | ICD-10-CM | POA: Insufficient documentation

## 2022-11-24 DIAGNOSIS — F17210 Nicotine dependence, cigarettes, uncomplicated: Secondary | ICD-10-CM | POA: Diagnosis not present

## 2022-11-24 DIAGNOSIS — E875 Hyperkalemia: Secondary | ICD-10-CM | POA: Insufficient documentation

## 2022-11-24 DIAGNOSIS — N2581 Secondary hyperparathyroidism of renal origin: Secondary | ICD-10-CM | POA: Diagnosis not present

## 2022-11-24 DIAGNOSIS — I120 Hypertensive chronic kidney disease with stage 5 chronic kidney disease or end stage renal disease: Secondary | ICD-10-CM | POA: Insufficient documentation

## 2022-11-24 DIAGNOSIS — Z1152 Encounter for screening for COVID-19: Secondary | ICD-10-CM | POA: Insufficient documentation

## 2022-11-24 DIAGNOSIS — Z7982 Long term (current) use of aspirin: Secondary | ICD-10-CM | POA: Diagnosis not present

## 2022-11-24 DIAGNOSIS — N186 End stage renal disease: Secondary | ICD-10-CM | POA: Diagnosis not present

## 2022-11-24 DIAGNOSIS — G253 Myoclonus: Secondary | ICD-10-CM | POA: Insufficient documentation

## 2022-11-24 DIAGNOSIS — I10 Essential (primary) hypertension: Secondary | ICD-10-CM | POA: Diagnosis present

## 2022-11-24 DIAGNOSIS — R4182 Altered mental status, unspecified: Secondary | ICD-10-CM | POA: Diagnosis present

## 2022-11-24 DIAGNOSIS — R402422 Glasgow coma scale score 9-12, at arrival to emergency department: Secondary | ICD-10-CM

## 2022-11-24 DIAGNOSIS — D649 Anemia, unspecified: Secondary | ICD-10-CM

## 2022-11-24 DIAGNOSIS — R251 Tremor, unspecified: Secondary | ICD-10-CM

## 2022-11-24 LAB — I-STAT VENOUS BLOOD GAS, ED
Acid-Base Excess: 6 mmol/L — ABNORMAL HIGH (ref 0.0–2.0)
Bicarbonate: 32.4 mmol/L — ABNORMAL HIGH (ref 20.0–28.0)
Calcium, Ion: 1.1 mmol/L — ABNORMAL LOW (ref 1.15–1.40)
HCT: 27 % — ABNORMAL LOW (ref 39.0–52.0)
Hemoglobin: 9.2 g/dL — ABNORMAL LOW (ref 13.0–17.0)
O2 Saturation: 77 %
Potassium: 6.6 mmol/L (ref 3.5–5.1)
Sodium: 132 mmol/L — ABNORMAL LOW (ref 135–145)
TCO2: 34 mmol/L — ABNORMAL HIGH (ref 22–32)
pCO2, Ven: 54.9 mmHg (ref 44–60)
pH, Ven: 7.379 (ref 7.25–7.43)
pO2, Ven: 44 mmHg (ref 32–45)

## 2022-11-24 LAB — CBC WITH DIFFERENTIAL/PLATELET
Abs Immature Granulocytes: 0.01 10*3/uL (ref 0.00–0.07)
Basophils Absolute: 0 10*3/uL (ref 0.0–0.1)
Basophils Relative: 1 %
Eosinophils Absolute: 0.1 10*3/uL (ref 0.0–0.5)
Eosinophils Relative: 2 %
HCT: 26.7 % — ABNORMAL LOW (ref 39.0–52.0)
Hemoglobin: 8.8 g/dL — ABNORMAL LOW (ref 13.0–17.0)
Immature Granulocytes: 0 %
Lymphocytes Relative: 16 %
Lymphs Abs: 0.8 10*3/uL (ref 0.7–4.0)
MCH: 33.7 pg (ref 26.0–34.0)
MCHC: 33 g/dL (ref 30.0–36.0)
MCV: 102.3 fL — ABNORMAL HIGH (ref 80.0–100.0)
Monocytes Absolute: 0.3 10*3/uL (ref 0.1–1.0)
Monocytes Relative: 6 %
Neutro Abs: 4.1 10*3/uL (ref 1.7–7.7)
Neutrophils Relative %: 75 %
Platelets: 125 10*3/uL — ABNORMAL LOW (ref 150–400)
RBC: 2.61 MIL/uL — ABNORMAL LOW (ref 4.22–5.81)
RDW: 14.4 % (ref 11.5–15.5)
WBC: 5.4 10*3/uL (ref 4.0–10.5)
nRBC: 0 % (ref 0.0–0.2)

## 2022-11-24 LAB — COMPREHENSIVE METABOLIC PANEL
ALT: 11 U/L (ref 0–44)
ALT: 10 U/L (ref 0–44)
AST: 25 U/L (ref 15–41)
AST: 20 U/L (ref 15–41)
Albumin: 4.1 g/dL (ref 3.5–5.0)
Albumin: 3.7 g/dL (ref 3.5–5.0)
Alkaline Phosphatase: 61 U/L (ref 38–126)
Alkaline Phosphatase: 54 U/L (ref 38–126)
Anion gap: 17 — ABNORMAL HIGH (ref 5–15)
Anion gap: 14 (ref 5–15)
BUN: 49 mg/dL — ABNORMAL HIGH (ref 8–23)
BUN: 49 mg/dL — ABNORMAL HIGH (ref 8–23)
CO2: 28 mmol/L (ref 22–32)
CO2: 31 mmol/L (ref 22–32)
Calcium: 9.4 mg/dL (ref 8.9–10.3)
Calcium: 9 mg/dL (ref 8.9–10.3)
Chloride: 89 mmol/L — ABNORMAL LOW (ref 98–111)
Chloride: 90 mmol/L — ABNORMAL LOW (ref 98–111)
Creatinine, Ser: 11.7 mg/dL — ABNORMAL HIGH (ref 0.61–1.24)
Creatinine, Ser: 12.12 mg/dL — ABNORMAL HIGH (ref 0.61–1.24)
GFR, Estimated: 4 mL/min — ABNORMAL LOW (ref >60)
GFR, Estimated: 4 mL/min — ABNORMAL LOW (ref >60)
Glucose, Bld: 89 mg/dL (ref 70–99)
Glucose, Bld: 84 mg/dL (ref 70–99)
Potassium: 5.8 mmol/L — ABNORMAL HIGH (ref 3.5–5.1)
Potassium: 6.7 mmol/L (ref 3.5–5.1)
Sodium: 134 mmol/L — ABNORMAL LOW (ref 135–145)
Sodium: 135 mmol/L (ref 135–145)
Total Bilirubin: 0.8 mg/dL (ref 0.3–1.2)
Total Bilirubin: 0.6 mg/dL (ref 0.3–1.2)
Total Protein: 7.1 g/dL (ref 6.5–8.1)
Total Protein: 6.5 g/dL (ref 6.5–8.1)

## 2022-11-24 LAB — LACTIC ACID, PLASMA
Lactic Acid, Venous: 2 mmol/L (ref 0.5–1.9)
Lactic Acid, Venous: 0.9 mmol/L (ref 0.5–1.9)

## 2022-11-24 LAB — ETHANOL: Alcohol, Ethyl (B): <10 mg/dL (ref <10)

## 2022-11-24 LAB — PHOSPHORUS: Phosphorus: 5.2 mg/dL — ABNORMAL HIGH (ref 2.5–4.6)

## 2022-11-24 LAB — SALICYLATE LEVEL: Salicylate Lvl: <7 mg/dL — ABNORMAL LOW (ref 7.0–30.0)

## 2022-11-24 LAB — GLUCOSE, CAPILLARY: Glucose-Capillary: 78 mg/dL (ref 70–99)

## 2022-11-24 LAB — CBG MONITORING, ED: Glucose-Capillary: 92 mg/dL (ref 70–99)

## 2022-11-24 LAB — HEPATITIS B SURFACE ANTIGEN: Hepatitis B Surface Ag: NONREACTIVE

## 2022-11-24 LAB — ACETAMINOPHEN LEVEL: Acetaminophen (Tylenol), Serum: <10 ug/mL — ABNORMAL LOW (ref 10–30)

## 2022-11-24 LAB — MAGNESIUM: Magnesium: 2.5 mg/dL — ABNORMAL HIGH (ref 1.7–2.4)

## 2022-11-24 MED ORDER — LACTATED RINGERS IV BOLUS
500.0000 mL | Freq: Once | INTRAVENOUS | Status: AC
  Administered 2022-11-24: 500 mL via INTRAVENOUS

## 2022-11-24 MED ORDER — HEPARIN SODIUM (PORCINE) 5000 UNIT/ML IJ SOLN
5000.0000 [IU] | Freq: Three times a day (TID) | INTRAMUSCULAR | Status: DC
  Administered 2022-11-25 – 2022-11-26 (×5): 5000 [IU] via SUBCUTANEOUS
  Filled 2022-11-24 (×5): qty 1

## 2022-11-24 MED ORDER — CHLORHEXIDINE GLUCONATE CLOTH 2 % EX PADS: 6.0000 | MEDICATED_PAD | Freq: Every day | CUTANEOUS | Status: DC

## 2022-11-24 MED ORDER — LACTATED RINGERS IV BOLUS: 1000.0000 mL | Freq: Once | INTRAVENOUS | Status: DC

## 2022-11-24 MED ORDER — METOPROLOL TARTRATE 50 MG PO TABS
150.0000 mg | ORAL_TABLET | Freq: Two times a day (BID) | ORAL | Status: DC
  Administered 2022-11-25 (×3): 150 mg via ORAL
  Filled 2022-11-24 (×3): qty 1

## 2022-11-24 MED ORDER — AMLODIPINE BESYLATE 10 MG PO TABS
10.0000 mg | ORAL_TABLET | Freq: Every day | ORAL | Status: DC
  Administered 2022-11-25: 10 mg via ORAL
  Filled 2022-11-24: qty 1

## 2022-11-24 MED ORDER — SODIUM ZIRCONIUM CYCLOSILICATE 10 G PO PACK: 10.0000 g | PACK | Freq: Once | ORAL | Status: DC

## 2022-11-24 MED ORDER — HYDRALAZINE HCL 50 MG PO TABS
100.0000 mg | ORAL_TABLET | Freq: Two times a day (BID) | ORAL | Status: DC
  Administered 2022-11-25 (×2): 100 mg via ORAL
  Filled 2022-11-24 (×3): qty 2

## 2022-11-24 NOTE — Progress Notes (Signed)
Critical potassium was called on patient. Potassium of a 6.7. Notified doctor around 6;35 pm. They are aware.

## 2022-11-24 NOTE — Consult Note (Signed)
[facility] Renal Consultation Note    Indication for Consultation:  Management of ESRD/hemodialysis; anemia, hypertension/volume and secondary hyperparathyroidism  HPI: Maurice Bell is a 67 y.o. male with ESRD on HD TTS, HTN, HLD, Hep C who presented to ED with AMS. Family called EMS d/t patient being confused and unsteady gait. Head CT shows chronic left cerebellar infarct. No acute findings. Labs notable for K 5.8 > 6.6. He is admitted Obs status. Nephrology consulted for urgent dialysis.  Last dialysis was Saturday. He completed a full treatment and left at his dry weight. He has not missed any dialysis. Per notes buprenorphine patch was found on his body and removed.   Seen and examined in the ED. Lying in bed with eyes closed, moving his head around. No meaningful responses to questions.   Past Medical History:  Diagnosis Date   Anemia    Anxiety    Arthritis    Depression    Dyspnea    "only when I have too much fluid"   ED (erectile dysfunction)    tx w/edex   ESRD (end stage renal disease) on dialysis ([place])    "[place]; TTS" (10/06/2018)   GERD (gastroesophageal reflux disease)    Hearing loss of both ears    wears hearing aids   Hepatitis C    tx w/[place]   History of anemia due to CKD    Hypertension    Myoclonic jerking    Pneumonia    Renal disorder    Stroke ([place])    bilat leg weakness residual- "years ago" - has weakness at times   Wears glasses    Wears partial dentures    lower   Past Surgical History:  Procedure Laterality Date   A/V FISTULAGRAM N/A 03/24/2019   Procedure: A/V FISTULAGRAM - right arm;  Surgeon: Angelia Mould, MD;  Location: [place] [facility];  Service: Cardiovascular;  Laterality: N/A;   A/V FISTULAGRAM Right 10/20/2021   Procedure: A/V FISTULAGRAM;  Surgeon: Waynetta Sandy, MD;  Location: [place] [facility];  Service: Cardiovascular;  Laterality: Right;   ANTERIOR CERVICAL DECOMPRESSION/DISCECTOMY  FUSION 4 LEVEL/HARDWARE REMOVAL N/A 05/15/2019   Procedure: REMOVAL OF C3-C7 PLATE, REVISION C3-4 FUSION WITH PARTIAL CORPECTOMY AT C3 AND REPLATING;  Surgeon: Marybelle Killings, MD;  Location: [place];  Service: Orthopedics;  Laterality: N/A;   ANTERIOR CERVICAL DECOMPRESSION/DISCECTOMY FUSION 4 LEVELS N/A 05/03/2019   Procedure: C3-4, C4-5, C5-6, C6-7 ANTERIOR CERVICAL DECOMPRESSION/DISCECTOMY FUSION, ALLOGRAFTS & PLATE;  Surgeon: Marybelle Killings, MD;  Location: [place];  Service: Orthopedics;  Laterality: N/A;   AV FISTULA PLACEMENT Bilateral    "right side not working anymore" (10/06/2018)   AV FISTULA PLACEMENT Right 02/15/2019   Procedure: Creation of arteriovenous fistula, right arm;  Surgeon: Serafina Mitchell, MD;  Location: [place];  Service: Vascular;  Laterality: Right;   [place] Right 03/29/2019   Procedure: SECOND STAGE BASILIC VEIN TRANSPOSITION RIGHT ARM;  Surgeon: Serafina Mitchell, MD;  Location: [place];  Service: Vascular;  Laterality: Right;   CATARACT EXTRACTION Bilateral    COLON RESECTION     from [place]   COLONOSCOPY W/ BIOPSIES AND POLYPECTOMY     HERNIA REPAIR     INSERTION OF DIALYSIS CATHETER Left 11/07/2018   Procedure: INSERTION OF TUNNELED DIALYSIS CATHETER - LEFT INTERNAL [place];  Surgeon: Angelia Mould, MD;  Location: [place];  Service: Vascular;  Laterality: Left;   INSERTION OF DIALYSIS CATHETER Right  08/21/2022   Procedure: INSERTION OF RIGHT INTERNAL JUGULAR TUNNELED DIALYSIS CATHETER;  Surgeon: Waynetta Sandy, MD;  Location: [place];  Service: Vascular;  Laterality: Right;   IR THROMBECTOMY AV FISTULA W/THROMBOLYSIS/PTA INC/SHUNT/IMG RIGHT Right 08/02/2019   IR US GUIDE VASC ACCESS RIGHT  08/02/2019   LEFT HEART CATH AND CORONARY ANGIOGRAPHY N/A 11/09/2019   Procedure: LEFT HEART CATH AND CORONARY ANGIOGRAPHY;  Surgeon: Jettie Booze, MD;  Location: [place] [facility];  Service: Cardiovascular;  Laterality: N/A;    PERIPHERAL VASCULAR INTERVENTION Right 10/20/2021   Procedure: PERIPHERAL VASCULAR INTERVENTION;  Surgeon: Waynetta Sandy, MD;  Location: [place] [facility];  Service: Cardiovascular;  Laterality: Right;   POSTERIOR CERVICAL FUSION/FORAMINOTOMY N/A 05/15/2019   Procedure: POSTERIOR CERVICAL FUSION/FORAMINOTOMY C3-7 WITH LATERAL MASS INSTRUMENTATION;  Surgeon: Marybelle Killings, MD;  Location: [place];  Service: Orthopedics;  Laterality: N/A;   REVISON OF ARTERIOVENOUS FISTULA Right 08/21/2022   Procedure: REVISION OF RIGHT ARM ARTERIOVENOUS FISTULA;  Surgeon: Waynetta Sandy, MD;  Location: [facility];  Service: Vascular;  Laterality: Right;   TONSILLECTOMY     UMBILICAL HERNIA REPAIR     Family History  Problem Relation Age of Onset   Hypertension Mother    Diabetes Mother    Hypertension Father    Diabetes Father    Hypertension Son    High blood pressure Other    Social History:  reports that he has been smoking cigarettes. He has been smoking an average of .25 packs per day. He has never been exposed to tobacco smoke. He has never used smokeless tobacco. He reports that he does not currently use alcohol after a past usage of about 1.0 standard drink of alcohol per week. He reports that he does not use drugs. Allergies  Allergen Reactions   Vicodin [Hydrocodone-Acetaminophen] Itching   Prior to Admission medications   Medication Sig Start Date End Date Taking? Authorizing Provider  acetaminophen (TYLENOL) 650 MG CR tablet Take 1,300 mg by mouth daily as needed for pain.    [provider]  alprostadil (EDEX) 20 MCG injection by Intracavitary route. 01/15/21   [provider]  amitriptyline (ELAVIL) 10 MG tablet Take 10 mg by mouth daily. 12/12/21   [provider]  amLODipine (NORVASC) 10 MG tablet Take 10 mg by mouth daily.    [provider]  aspirin EC 81 MG tablet Take 81 mg by mouth daily.    [provider]  B  Complex-C-Folic Acid (NEPHRO-VITE PO) Take 0.8 mg by mouth.    [provider]  buprenorphine (BUTRANS) 10 MCG/HR [place] 1 patch onto the skin once a week. 07/29/22   [provider]  buprenorphine (BUTRANS) 5 MCG/HR PTWK 1 patch once a week. 06/02/21   [provider]  ciclopirox (PENLAC) 8 % solution Apply topically at bedtime. Apply over nail and surrounding skin. Apply daily over previous coat. After seven (7) days, may remove with alcohol and continue cycle. 12/16/20   Trula Slade, DPM  diphenhydrAMINE-APAP, sleep, (APAP-DIPHENHYDRAMINE PO) Take 1 tablet by mouth 3 (three) times a week. 05/01/21   [provider]  hydrALAZINE (APRESOLINE) 100 MG tablet Take 100 mg by mouth 2 (two) times daily. 07/09/21   [provider]  hydrocortisone (ANUSOL-HC) 2.5 % rectal cream  06/27/21   [provider]  ibuprofen (ADVIL) 200 MG tablet Take 400 mg by mouth daily as needed (pain).    [provider]  iron sucrose (VENOFER)  20 MG/ML injection 50 mg. 03/04/21   [provider]  metoprolol tartrate (LOPRESSOR) 50 MG tablet Take 150 mg by mouth 2 (two) times daily.    [provider]  mirtazapine (REMERON) 7.5 MG tablet Take 7.5 mg by mouth at bedtime. 08/20/21   [provider]  multivitamin (RENA-VIT) TABS tablet Take 1 tablet by mouth at bedtime. Patient taking differently: Take 1 tablet by mouth daily. 09/11/18   [name], [name], MD  nicotine (NICODERM CQ - DOSED IN MG/24 HOURS) 14 mg/24hr patch Apply 14mg  patch one daily for 6 weeks, then change to 14mg  patch. Remove old patch before applying new one. 12/13/19   Imogene Burn, PA-C  nicotine (NICODERM CQ - DOSED IN MG/24 HR) 7 mg/24hr patch Apply 7mg  patch one daily for 2 weeks. Remove old patch before applying new one. 12/13/19   Imogene Burn, PA-C  nystatin (MYCOSTATIN/NYSTOP) powder Apply 1 Application topically 3 (three) times daily. Patient taking  differently: Apply 1 Application topically daily. 07/03/22   [name], [name], NP  oxyCODONE-acetaminophen (PERCOCET/ROXICET) 5-325 MG tablet Take 1 tablet by mouth in the morning, at noon, and at bedtime.    [provider]  senna (SENOKOT) 8.6 MG TABS tablet Take 1 tablet (8.6 mg total) by mouth daily as needed for mild constipation. 10/25/20   Alexandria Lodge, MD  sertraline (ZOLOFT) 25 MG tablet Take by mouth. 12/29/21   [provider]  sertraline (ZOLOFT) 50 MG tablet Take 50 mg by mouth daily. 05/11/22   [provider]  sevelamer carbonate (RENVELA) 800 MG tablet Take 4 tablets (3,200 mg total) by mouth 3 (three) times daily with meals. Patient taking differently: Take 3,200 mg by mouth daily. 09/11/18   [name], [name], MD  sildenafil (VIAGRA) 50 MG tablet Take 50 mg by mouth daily as needed for erectile dysfunction.    [provider]  SUMAtriptan (IMITREX) 50 MG tablet Take by mouth. 12/05/21   [provider]  valACYclovir (VALTREX) 500 MG tablet Take 1 tablet (500 mg total) by mouth daily. 05/29/19   [name], NP   Current Facility-Administered Medications  Medication Dose Route Frequency Provider Last Rate Last Admin   [START ON 11/25/2022] Chlorhexidine Gluconate Cloth 2 % PADS 6 each  6 each Topical Q0600 Lynnda Child, PA-C       heparin injection 5,000 Units  5,000 Units Subcutaneous Q8H Arlyce Dice, MD       sodium chloride flush (NS) 0.9 % injection 3 mL  3 mL Intravenous PRN Marty Heck, MD       Current Outpatient Medications  Medication Sig Dispense Refill   acetaminophen (TYLENOL) 650 MG CR tablet Take 1,300 mg by mouth daily as needed for pain.     alprostadil (EDEX) 20 MCG injection by Intracavitary route.     amitriptyline (ELAVIL) 10 MG tablet Take 10 mg by mouth daily.     amLODipine (NORVASC) 10 MG tablet Take 10 mg by mouth daily.     aspirin EC 81 MG tablet Take 81 mg by mouth daily.     B  Complex-C-Folic Acid (NEPHRO-VITE PO) Take 0.8 mg by mouth.     buprenorphine (BUTRANS) 10 MCG/HR PTWK Place 1 patch onto the skin once a week.     buprenorphine (BUTRANS) 5 MCG/HR PTWK 1 patch once a week.     ciclopirox (PENLAC) 8 % solution Apply topically at bedtime. Apply over nail and surrounding skin. Apply daily over  previous coat. After seven (7) days, may remove with alcohol and continue cycle. 6.6 mL 2   diphenhydrAMINE-APAP, sleep, (APAP-DIPHENHYDRAMINE PO) Take 1 tablet by mouth 3 (three) times a week.     hydrALAZINE (APRESOLINE) 100 MG tablet Take 100 mg by mouth 2 (two) times daily.     hydrocortisone (ANUSOL-HC) 2.5 % rectal cream      ibuprofen (ADVIL) 200 MG tablet Take 400 mg by mouth daily as needed (pain).     iron sucrose (VENOFER) 20 MG/ML injection 50 mg.     metoprolol tartrate (LOPRESSOR) 50 MG tablet Take 150 mg by mouth 2 (two) times daily.     mirtazapine (REMERON) 7.5 MG tablet Take 7.5 mg by mouth at bedtime.     multivitamin (RENA-VIT) TABS tablet Take 1 tablet by mouth at bedtime. (Patient taking differently: Take 1 tablet by mouth daily.) 30 tablet 3   nicotine (NICODERM CQ - DOSED IN MG/24 HOURS) 14 mg/24hr patch Apply 14mg  patch one daily for 6 weeks, then change to 14mg  patch. Remove old patch before applying new one. 42 patch 0   nicotine (NICODERM CQ - DOSED IN MG/24 HR) 7 mg/24hr patch Apply 7mg  patch one daily for 2 weeks. Remove old patch before applying new one. 14 patch 0   nystatin (MYCOSTATIN/NYSTOP) powder Apply 1 Application topically 3 (three) times daily. (Patient taking differently: Apply 1 Application topically daily.) 30 g 0   oxyCODONE-acetaminophen (PERCOCET/ROXICET) 5-325 MG tablet Take 1 tablet by mouth in the morning, at noon, and at bedtime.     senna (SENOKOT) 8.6 MG TABS tablet Take 1 tablet (8.6 mg total) by mouth daily as needed for mild constipation. 120 tablet 0   sertraline (ZOLOFT) 25 MG tablet Take by mouth.     sertraline  (ZOLOFT) 50 MG tablet Take 50 mg by mouth daily.     sevelamer carbonate (RENVELA) 800 MG tablet Take 4 tablets (3,200 mg total) by mouth 3 (three) times daily with meals. (Patient taking differently: Take 3,200 mg by mouth daily.) 180 tablet 5   sildenafil (VIAGRA) 50 MG tablet Take 50 mg by mouth daily as needed for erectile dysfunction.     SUMAtriptan (IMITREX) 50 MG tablet Take by mouth.     valACYclovir (VALTREX) 500 MG tablet Take 1 tablet (500 mg total) by mouth daily. 30 tablet 0     ROS: As per HPI otherwise negative.  Physical Exam: Vitals:   11/24/22 1424 11/24/22 1545 11/24/22 1600 11/24/22 1615  BP: (!) 169/122 (!) 168/75 (!) 202/79 (!) 185/73  Pulse: 72 61    Resp: (!) 22 11 13 16   Temp: 97.7 F (36.5 C)     TempSrc: Oral     SpO2: 100% 100%    Weight:      Height:         General: Lying in bed, eyes closed, nad Head: NCAT sclera not icteric MMM Neck: Supple. Trachea midline.  Lungs: Clear bilaterally without wheezes, rales, or rhonchi.  Heart: RRR, no murmur, rub, or gallop  Abdomen: soft non-tender, bowel sounds normal, no masses  Lower extremities:without edema or ischemic changes, no open wounds  Neuro: Not following commands  Psych:  Unable to answer questions  Dialysis Access: RUE AVF +bruit   Labs: Basic Metabolic Panel: Recent Labs  Lab 11/24/22 1020 11/24/22 1527  NA 134* 132*  K 5.8* 6.6*  CL 89*  --   CO2 28  --   GLUCOSE 89  --  BUN 49*  --   CREATININE 11.70*  --   CALCIUM 9.4  --    Liver Function Tests: Recent Labs  Lab 11/24/22 1020  AST 25  ALT 11  ALKPHOS 61  BILITOT 0.8  PROT 7.1  ALBUMIN 4.1   No results for input(s): "LIPASE", "AMYLASE" in the last 168 hours. No results for input(s): "AMMONIA" in the last 168 hours. CBC: Recent Labs  Lab 11/24/22 1020 11/24/22 1527  WBC 5.4  --   NEUTROABS 4.1  --   HGB 8.8* 9.2*  HCT 26.7* 27.0*  MCV 102.3*  --   PLT 125*  --    Cardiac Enzymes: No results for input(s):  "CKTOTAL", "CKMB", "CKMBINDEX", "TROPONINI" in the last 168 hours. CBG: Recent Labs  Lab 11/24/22 0949  GLUCAP 92   Iron Studies: No results for input(s): "IRON", "TIBC", "TRANSFERRIN", "FERRITIN" in the last 72 hours. Studies/Results: CT Head Wo Contrast  Result Date: 11/24/2022 CLINICAL DATA:  Mental status changes. EXAM: CT HEAD WITHOUT CONTRAST TECHNIQUE: Contiguous axial images were obtained from the base of the skull through the vertex without intravenous contrast. RADIATION DOSE REDUCTION: This exam was performed according to the departmental dose-optimization program which includes automated exposure control, adjustment of the mA and/or kV according to patient size and/or use of iterative reconstruction technique. COMPARISON:  11/25/2021 FINDINGS: Brain: There is no intra or extra-axial fluid collection or mass lesion. The basilar cisterns and ventricles have a normal appearance. There is no CT evidence for acute infarction or hemorrhage. Chronic LEFT cerebellar infarct appears stable. Vascular: No hyperdense vessel or unexpected calcification. Skull: Normal. Negative for fracture or focal lesion. Sinuses/Orbits: Similar, significant RIGHT mastoid effusion, extending into the petrous apex. LEFT mastoid air cells are normal. Paranasal sinuses and orbits are unremarkable. Other: None IMPRESSION: 1. No evidence for acute intracranial abnormality. 2. Chronic LEFT cerebellar infarct. 3. Similar, significant RIGHT mastoid effusion, extending into the petrous apex. Electronically Signed   By: Nolon Nations M.D.   On: 11/24/2022 14:03    Dialysis Orders:  [facility] 3:15 450  53.8kg 2K/2Ca UFP 4 AVF No heparin  -Mircera 60 q 4 wks (last 10/27/22) -Hectorol 3 TIW   Assessment/Plan: AMS - Head CT negative. Unclear etiology ?medication toxicity. ?HTN emergency Doubtful uremia - has not missed any dialysis. Will see if improves with HD.  Hyperkalemia - Correct with dialysis tonight. [place]elma ordered  in the interim  ESRD -  HD TTS. Continue on schedule. HD tonight.  Hypertension/volume  - Severe HTN on admit. Continue home meds (amlodipine, hydralazine, metoprolol. UF 2-3L with HD tonight.  Anemia  - Hb 9.2. On ESA as outpatient. Follow trends  Metabolic bone disease -  Ca ok. Continue home binder when eating   Lynnda Child PA-C [facility] 11/24/2022, 4:27 PM

## 2022-11-24 NOTE — ED Notes (Signed)
Attempted to obtain blood work x2 unsuccessful.

## 2022-11-24 NOTE — Progress Notes (Signed)
Pre Hemodialysis, pt received from 2W, pt is alert and disoriented to time, didn't know how he arrived or how long he'd been here. Pt is hypertensive, denies any symptoms, denies pain. Pt is stable for HD tx.   11/24/22 2100  Pain Assessment  Pain Scale 0-10  Pain Score 0  Neurological  Level of Consciousness Alert  Orientation Level Oriented to place;Oriented to person;Oriented to situation;Disoriented to time  Respiratory  Respiratory Pattern Regular;Unlabored  Chest Assessment Chest expansion symmetrical  Bilateral Breath Sounds Diminished;Clear  R Upper  Breath Sounds Diminished  L Upper Breath Sounds Diminished  R Lower Breath Sounds Diminished  L Lower Breath Sounds Diminished  Cardiac  Pulse Regular  Heart Sounds S1, S2  ECG Monitor Yes (in HD unit)  Cardiac Rhythm NSR  Psychosocial  Psychosocial (WDL) WDL  Patient Behaviors Calm;Cooperative  Needs Expressed Denies

## 2022-11-24 NOTE — ED Notes (Signed)
Pt reports that he does not make urine not able to get urine specimen.

## 2022-11-24 NOTE — ED Notes (Signed)
..ED TO INPATIENT HANDOFF REPORT  ED Nurse Name and Phone #: [phone]  S Name/Age/Gender Maurice Bell 67 y.o. male Room/Bed: 028C/028C  Code Status   Code Status: Full Code  Home/SNF/Other Home Patient oriented to: self and place Is this baseline? No   Triage Complete: Triage complete  Chief Complaint AMS (altered mental status) [R41.82]  Triage Note Pt to ED via EMS from home, per EMS pt family member (son) called out due to pt being confused. Per EMS son states pt is Aox4 baseline and ambulatory. Pt is dialysis pt was supposed to go today. EMS reports unsteady gate and pt was last seen normal at 5p yesterday. Per EMS pt son states pt lives at home alone. Pt is alert to self and place in triage, disoriented to time. Denies any falls or injuries but reports back pain. Pupils pinpoint in triage.   Allergies Allergies  Allergen Reactions   Vicodin [Hydrocodone-Acetaminophen] Itching    Level of Care/Admitting Diagnosis ED Disposition     ED Disposition  Admit   Condition  --   Comment  Hospital Area: [facility] [[id number]]  Level of Care: Med-Surg [16]  May place patient in observation at [facility] or [facility] if equivalent level of care is available:: No  Covid Evaluation: Asymptomatic - no recent exposure (last 10 days) testing not required  Diagnosis: AMS (altered mental status) [[id number]]  Admitting Physician: Kinnie Feil [[id number]]  Attending Physician: Kinnie Feil [[id number]]          B Medical/Surgery History Past Medical History:  Diagnosis Date   Anemia    Anxiety    Arthritis    Depression    Dyspnea    "only when I have too much fluid"   ED (erectile dysfunction)    tx w/edex   ESRD (end stage renal disease) on dialysis ([place])    "E. [place]; TTS" (10/06/2018)   GERD (gastroesophageal reflux disease)    Hearing loss of both ears    wears hearing aids   Hepatitis C    tx w/[place]   History of anemia due to CKD     Hypertension    Myoclonic jerking    Pneumonia    Renal disorder    Stroke ([place])    bilat leg weakness residual- "years ago" - has weakness at times   Wears glasses    Wears partial dentures    lower   Past Surgical History:  Procedure Laterality Date   A/V FISTULAGRAM N/A 03/24/2019   Procedure: A/V FISTULAGRAM - right arm;  Surgeon: Angelia Mould, MD;  Location: [facility];  Service: Cardiovascular;  Laterality: N/A;   A/V FISTULAGRAM Right 10/20/2021   Procedure: A/V FISTULAGRAM;  Surgeon: Waynetta Sandy, MD;  Location: [facility];  Service: Cardiovascular;  Laterality: Right;   ANTERIOR CERVICAL DECOMPRESSION/DISCECTOMY FUSION 4 LEVEL/HARDWARE REMOVAL N/A 05/15/2019   Procedure: REMOVAL OF C3-C7 PLATE, REVISION C3-4 FUSION WITH PARTIAL CORPECTOMY AT C3 AND REPLATING;  Surgeon: Marybelle Killings, MD;  Location: [facility];  Service: Orthopedics;  Laterality: N/A;   ANTERIOR CERVICAL DECOMPRESSION/DISCECTOMY FUSION 4 LEVELS N/A 05/03/2019   Procedure: C3-4, C4-5, C5-6, C6-7 ANTERIOR CERVICAL DECOMPRESSION/DISCECTOMY FUSION, ALLOGRAFTS & PLATE;  Surgeon: Marybelle Killings, MD;  Location: [facility];  Service: Orthopedics;  Laterality: N/A;   AV FISTULA PLACEMENT Bilateral    "right side not working anymore" (10/06/2018)   AV FISTULA PLACEMENT Right 02/15/2019   Procedure: Creation of arteriovenous  fistula, right arm;  Surgeon: Serafina Mitchell, MD;  Location: [facility];  Service: Vascular;  Laterality: Right;   Tat Momoli Right 03/29/2019   Procedure: SECOND STAGE BASILIC VEIN TRANSPOSITION RIGHT ARM;  Surgeon: Serafina Mitchell, MD;  Location: [facility];  Service: Vascular;  Laterality: Right;   CATARACT EXTRACTION Bilateral    COLON RESECTION     from [place] Left 11/07/2018   Procedure: INSERTION OF TUNNELED DIALYSIS CATHETER - LEFT INTERNAL JUGULAR PLACEMENT;   Surgeon: Angelia Mould, MD;  Location: [facility];  Service: Vascular;  Laterality: Left;   INSERTION OF DIALYSIS CATHETER Right 08/21/2022   Procedure: INSERTION OF RIGHT INTERNAL JUGULAR TUNNELED DIALYSIS CATHETER;  Surgeon: Waynetta Sandy, MD;  Location: [facility];  Service: Vascular;  Laterality: Right;   IR THROMBECTOMY AV FISTULA W/THROMBOLYSIS/PTA INC/SHUNT/IMG RIGHT Right 08/02/2019   IR US GUIDE VASC ACCESS RIGHT  08/02/2019   LEFT HEART CATH AND CORONARY ANGIOGRAPHY N/A 11/09/2019   Procedure: LEFT HEART CATH AND CORONARY ANGIOGRAPHY;  Surgeon: Jettie Booze, MD;  Location: [facility];  Service: Cardiovascular;  Laterality: N/A;   PERIPHERAL VASCULAR INTERVENTION Right 10/20/2021   Procedure: PERIPHERAL VASCULAR INTERVENTION;  Surgeon: Waynetta Sandy, MD;  Location: [facility];  Service: Cardiovascular;  Laterality: Right;   POSTERIOR CERVICAL FUSION/FORAMINOTOMY N/A 05/15/2019   Procedure: POSTERIOR CERVICAL FUSION/FORAMINOTOMY C3-7 WITH LATERAL MASS INSTRUMENTATION;  Surgeon: Marybelle Killings, MD;  Location: [facility];  Service: Orthopedics;  Laterality: N/A;   REVISON OF ARTERIOVENOUS FISTULA Right 08/21/2022   Procedure: REVISION OF RIGHT ARM ARTERIOVENOUS FISTULA;  Surgeon: Waynetta Sandy, MD;  Location: [facility];  Service: Vascular;  Laterality: Right;   TONSILLECTOMY     UMBILICAL HERNIA REPAIR       A IV Location/Drains/Wounds Patient Lines/Drains/Airways Status     Active Line/Drains/Airways     Name Placement date Placement time Site Days   Peripheral IV 11/24/22 22 G 1.75" Anterior;Left;Proximal Forearm 11/24/22  1446  Forearm  less than 1   Fistula / Graft Right Upper arm Arteriovenous fistula 02/15/19  1204  Upper arm  1378   Fistula / Graft Right Upper arm 10/23/20  1000  Upper arm  762   Hemodialysis Catheter Left Internal jugular Double-lumen;Permanent 11/07/18  0901  Internal jugular  1478   Hemodialysis Catheter  Right Internal jugular Double lumen Permanent (Tunneled) 08/20/22  1110  Internal jugular  96   Incision (Closed) 08/02/19 Arm Right 08/02/19  1100  -- 1210   Incision (Closed) 08/21/22 Arm Right 08/21/22  1142  -- 95   Incision (Closed) 08/21/22 Chest Left 08/21/22  1142  -- 95            Intake/Output Last 24 hours No intake or output data in the 24 hours ending 11/24/22 1606  Labs/Imaging Results for orders placed or performed during the hospital encounter of 11/24/22 (from the past 48 hour(s))  CBG monitoring, ED     Status: None   Collection Time: 11/24/22  9:49 AM  Result Value Ref Range   Glucose-Capillary 92 70 - 99 mg/dL    Comment: Glucose reference range applies only to samples taken after fasting for at least 8 hours.  CBC with Differential     Status: Abnormal   Collection Time: 11/24/22 10:20 AM  Result Value Ref Range   WBC  5.4 4.0 - 10.5 K/uL   RBC 2.61 (L) 4.22 - 5.81 MIL/uL   Hemoglobin 8.8 (L) 13.0 - 17.0 g/dL   HCT 26.7 (L) 39.0 - 52.0 %   MCV 102.3 (H) 80.0 - 100.0 fL   MCH 33.7 26.0 - 34.0 pg   MCHC 33.0 30.0 - 36.0 g/dL   RDW 14.4 11.5 - 15.5 %   Platelets 125 (L) 150 - 400 K/uL   nRBC 0.0 0.0 - 0.2 %   Neutrophils Relative % 75 %   Neutro Abs 4.1 1.7 - 7.7 K/uL   Lymphocytes Relative 16 %   Lymphs Abs 0.8 0.7 - 4.0 K/uL   Monocytes Relative 6 %   Monocytes Absolute 0.3 0.1 - 1.0 K/uL   Eosinophils Relative 2 %   Eosinophils Absolute 0.1 0.0 - 0.5 K/uL   Basophils Relative 1 %   Basophils Absolute 0.0 0.0 - 0.1 K/uL   Immature Granulocytes 0 %   Abs Immature Granulocytes 0.01 0.00 - 0.07 K/uL    Comment: Performed at [facility] [address], [place], [place] [address]  Comprehensive metabolic panel     Status: Abnormal   Collection Time: 11/24/22 10:20 AM  Result Value Ref Range   Sodium 134 (L) 135 - 145 mmol/L   Potassium 5.8 (H) 3.5 - 5.1 mmol/L   Chloride 89 (L) 98 - 111 mmol/L   CO2 28 22 - 32 mmol/L   Glucose, Bld 89 70 -  99 mg/dL    Comment: Glucose reference range applies only to samples taken after fasting for at least 8 hours.   BUN 49 (H) 8 - 23 mg/dL   Creatinine, Ser 11.70 (H) 0.61 - 1.24 mg/dL   Calcium 9.4 8.9 - 10.3 mg/dL   Total Protein 7.1 6.5 - 8.1 g/dL   Albumin 4.1 3.5 - 5.0 g/dL   AST 25 15 - 41 U/L   ALT 11 0 - 44 U/L   Alkaline Phosphatase 61 38 - 126 U/L   Total Bilirubin 0.8 0.3 - 1.2 mg/dL   GFR, Estimated 4 (L) >60 mL/min    Comment: (NOTE) Calculated using the CKD-EPI Creatinine Equation (2021)    Anion gap 17 (H) 5 - 15    Comment: Performed at [facility] [address] [place], [place] [address]  Magnesium     Status: Abnormal   Collection Time: 11/24/22 10:20 AM  Result Value Ref Range   Magnesium 2.5 (H) 1.7 - 2.4 mg/dL    Comment: Performed at [facility] [address] [place], [place] [address]  Lactic acid, plasma     Status: Abnormal   Collection Time: 11/24/22  1:00 PM  Result Value Ref Range   Lactic Acid, Venous 2.0 (HH) 0.5 - 1.9 mmol/L    Comment: CRITICAL RESULT CALLED TO, READ BACK BY AND VERIFIED WITH M.Kayon Dozier,RN 1422 11/24/22 CLARK,S Performed at [facility], [place] [address], [place], [place] [address]   I-Stat venous blood gas, [facility], [facility], [facility])     Status: Abnormal   Collection Time: 11/24/22  3:27 PM  Result Value Ref Range   pH, Ven 7.379 7.25 - 7.43   pCO2, Ven 54.9 44 - 60 mmHg   pO2, Ven 44 32 - 45 mmHg   Bicarbonate 32.4 (H) 20.0 - 28.0 mmol/L   TCO2 34 (H) 22 - 32 mmol/L   O2 Saturation 77 %   Acid-Base Excess 6.0 (H) 0.0 -  2.0 mmol/L   Sodium 132 (L) 135 - 145 mmol/L   Potassium 6.6 (HH) 3.5 - 5.1 mmol/L   Calcium, Ion 1.10 (L) 1.15 - 1.40 mmol/L   HCT 27.0 (L) 39.0 - 52.0 %   Hemoglobin 9.2 (L) 13.0 - 17.0 g/dL   Sample type VENOUS    Comment NOTIFIED PHYSICIAN    CT Head Wo Contrast  Result Date: 11/24/2022 CLINICAL DATA:  Mental status changes. EXAM: CT HEAD WITHOUT CONTRAST TECHNIQUE: Contiguous axial  images were obtained from the base of the skull through the vertex without intravenous contrast. RADIATION DOSE REDUCTION: This exam was performed according to the departmental dose-optimization program which includes automated exposure control, adjustment of the mA and/or kV according to patient size and/or use of iterative reconstruction technique. COMPARISON:  11/25/2021 FINDINGS: Brain: There is no intra or extra-axial fluid collection or mass lesion. The basilar cisterns and ventricles have a normal appearance. There is no CT evidence for acute infarction or hemorrhage. Chronic LEFT cerebellar infarct appears stable. Vascular: No hyperdense vessel or unexpected calcification. Skull: Normal. Negative for fracture or focal lesion. Sinuses/Orbits: Similar, significant RIGHT mastoid effusion, extending into the petrous apex. LEFT mastoid air cells are normal. Paranasal sinuses and orbits are unremarkable. Other: None IMPRESSION: 1. No evidence for acute intracranial abnormality. 2. Chronic LEFT cerebellar infarct. 3. Similar, significant RIGHT mastoid effusion, extending into the petrous apex. Electronically Signed   By: Nolon Nations M.D.   On: 11/24/2022 14:03    Pending Labs Unresulted Labs (From admission, onward)     Start     Ordered   11/25/22 0500  HIV Antibody (routine testing w rflx)  (HIV Antibody (Routine testing w reflex) panel)  Tomorrow morning,   R        11/24/22 1600   11/25/22 0500  Comprehensive metabolic panel  Tomorrow morning,   R        11/24/22 1600   11/25/22 0500  CBC  Tomorrow morning,   R        11/24/22 1600   11/25/22 0500  Magnesium  Tomorrow morning,   R        11/24/22 1600   11/25/22 0500  Phosphorus  Tomorrow morning,   R        11/24/22 1600   11/24/22 1558  Comprehensive metabolic panel  Once,   STAT        11/24/22 1558   11/24/22 1558  Phosphorus  Once,   R        11/24/22 1600   23/53/61 4431  Salicylate level  Once,   STAT        11/24/22 1221    11/24/22 1222  Acetaminophen level  Once,   STAT        11/24/22 1221   11/24/22 1221  Lactic acid, plasma  Now then every 2 hours,   R (with STAT occurrences)      11/24/22 1220   11/24/22 1056  Rapid urine drug screen (hospital performed)  ONCE - STAT,   STAT        11/24/22 1056   11/24/22 1056  Ethanol  Once,   URGENT        11/24/22 1056   11/24/22 0953  Urinalysis, Routine w reflex microscopic Urine, Clean Catch  Once,   URGENT        11/24/22 0952            Vitals/Pain Today's Vitals   11/24/22 1100 11/24/22 1215 11/24/22  1424 11/24/22 1545  BP: (!) 162/76 137/69 (!) 169/122 (!) 168/75  Pulse: 65 (!) 59 72 61  Resp: 18 14 (!) 22 11  Temp:   97.7 F (36.5 C)   TempSrc:   Oral   SpO2: 100% 100% 100% 100%  Weight:      Height:      PainSc:        Isolation Precautions No active isolations  Medications Medications  lactated ringers bolus 500 mL (has no administration in time range)  heparin injection 5,000 Units (has no administration in time range)    Mobility walks     Focused Assessments Cardiac Assessment Handoff:    Lab Results  Component Value Date   TROPONINI 0.03 (HH) 11/28/2018   Lab Results  Component Value Date   DDIMER 1.27 (H) 11/05/2020   Does the Patient currently have chest pain? No   , Neuro Assessment Handoff:  Swallow screen pass?  Not completed     Last date known well: 11/23/22 Last time known well: 1700 Neuro Assessment: Exceptions to [facility] Neuro Checks:      Has TPA been given? No If patient is a Neuro Trauma and patient is going to OR before floor call report to [facility] nurse: [phone] or [phone]  , NA   R Recommendations: See Admitting Provider Note  Report given to:   Additional Notes: NA

## 2022-11-24 NOTE — ED Triage Notes (Signed)
Pt to ED via EMS from home, per EMS pt family member (son) called out due to pt being confused. Per EMS son states pt is Aox4 baseline and ambulatory. Pt is dialysis pt was supposed to go today. EMS reports unsteady gate and pt was last seen normal at 5p yesterday. Per EMS pt son states pt lives at home alone. Pt is alert to self and place in triage, disoriented to time. Denies any falls or injuries but reports back pain. Pupils pinpoint in triage.

## 2022-11-24 NOTE — Assessment & Plan Note (Addendum)
On dialysis T/Th/Sa. Per son, he did go to Saturday dialysis and only missed this AM session. Admitted w/ electrolyte derangement including hyponatremia, hyperkalemia, hypermag, uremia. Prior admission for uremic encephalopathy was due to only missing 1 session of dialysis.  - Nephrology consulted, appreciate recs. - AM CMP, Mg, Phos

## 2022-11-24 NOTE — ED Notes (Signed)
Pt transported to unit.

## 2022-11-24 NOTE — ED Provider Notes (Signed)
Maurice Bell   CSN: [id number] Arrival date & time: 11/24/22  8416     History  Chief Complaint  Patient presents with   Altered Mental Status    Maurice Bell is a 67 y.o. male with ESRD on HD, HTN, chronic hepatitis C, GERD, pancytopenia, history of penile implant, lumbar stenosis, movement disorder who presents BIBEMS with confusion.   LKN 5 pm yesterday afternoon. A&Ox2 in triage, disoriented to time and situation. Patient with GCS 11 (E2V3M6) with pinpoint pupils on exam. He cannot answer any questions except that he is in a hospital and he is not in any pain.   Per chart review patient was seen on 11/09/2022 by podiatry for nail clipping and apparently had norma mental status at that time.  HPI     Home Medications Prior to Admission medications   Medication Sig Start Date End Date Taking? Authorizing Provider  acetaminophen (TYLENOL) 650 MG CR tablet Take 1,300 mg by mouth daily as needed for pain.    [provider]  alprostadil (EDEX) 20 MCG injection by Intracavitary route. 01/15/21   [provider]  amitriptyline (ELAVIL) 10 MG tablet Take 10 mg by mouth daily. 12/12/21   [provider]  amLODipine (NORVASC) 10 MG tablet Take 10 mg by mouth daily.    [provider]  aspirin EC 81 MG tablet Take 81 mg by mouth daily.    [provider]  B Complex-C-Folic Acid (NEPHRO-VITE PO) Take 0.8 mg by mouth.    [provider]  buprenorphine (BUTRANS) 10 MCG/HR [place] 1 patch onto the skin once a week. 07/29/22   [provider]  buprenorphine (BUTRANS) 5 MCG/HR [place] 1 patch once a week. 06/02/21   [provider]  ciclopirox (PENLAC) 8 % solution Apply topically at bedtime. Apply over nail and surrounding skin. Apply daily over previous coat. After seven (7) days, may remove with alcohol and continue cycle. 12/16/20   Trula Slade, DPM   diphenhydrAMINE-APAP, sleep, (APAP-DIPHENHYDRAMINE PO) Take 1 tablet by mouth 3 (three) times a week. 05/01/21   [provider]  hydrALAZINE (APRESOLINE) 100 MG tablet Take 100 mg by mouth 2 (two) times daily. 07/09/21   [provider]  hydrocortisone (ANUSOL-HC) 2.5 % rectal cream  06/27/21   [provider]  ibuprofen (ADVIL) 200 MG tablet Take 400 mg by mouth daily as needed (pain).    [provider]  iron sucrose (VENOFER) 20 MG/ML injection 50 mg. 03/04/21   [provider]  metoprolol tartrate (LOPRESSOR) 50 MG tablet Take 150 mg by mouth 2 (two) times daily.    [provider]  mirtazapine (REMERON) 7.5 MG tablet Take 7.5 mg by mouth at bedtime. 08/20/21   [provider]  multivitamin (RENA-VIT) TABS tablet Take 1 tablet by mouth at bedtime. Patient taking differently: Take 1 tablet by mouth daily. 09/11/18   [name], [name], MD  nicotine (NICODERM CQ - DOSED IN MG/24 HOURS) 14 mg/24hr patch Apply 14mg  patch one daily for 6 weeks, then change to 14mg  patch. Remove old patch before applying new one. 12/13/19   Maurice Burn, PA-C  nicotine (NICODERM CQ - DOSED IN MG/24 HR) 7 mg/24hr patch Apply 7mg  patch one daily for 2 weeks. Remove old patch before applying new one. 12/13/19   Maurice Burn, PA-C  nystatin (MYCOSTATIN/NYSTOP) powder Apply 1 Application topically 3 (three) times daily. Patient taking differently: Apply 1  Application topically daily. 07/03/22   [name], [name], NP  oxyCODONE-acetaminophen (PERCOCET/ROXICET) 5-325 MG tablet Take 1 tablet by mouth in the morning, at noon, and at bedtime.    [provider]  senna (SENOKOT) 8.6 MG TABS tablet Take 1 tablet (8.6 mg total) by mouth daily as needed for mild constipation. 10/25/20   Alexandria Lodge, MD  sertraline (ZOLOFT) 25 MG tablet Take by mouth. 12/29/21   [provider]  sertraline (ZOLOFT) 50 MG tablet Take 50 mg by mouth daily. 05/11/22    [provider]  sevelamer carbonate (RENVELA) 800 MG tablet Take 4 tablets (3,200 mg total) by mouth 3 (three) times daily with meals. Patient taking differently: Take 3,200 mg by mouth daily. 09/11/18   [name], [name], MD  sildenafil (VIAGRA) 50 MG tablet Take 50 mg by mouth daily as needed for erectile dysfunction.    [provider]  SUMAtriptan (IMITREX) 50 MG tablet Take by mouth. 12/05/21   [provider]  valACYclovir (VALTREX) 500 MG tablet Take 1 tablet (500 mg total) by mouth daily. 05/29/19   Maurice Perna, NP      Allergies    Vicodin [hydrocodone-acetaminophen]    Review of Systems   Review of Systems  Unable to perform ROS: Mental status change    Physical Exam Updated Vital Signs BP (!) 169/122   Pulse 72   Temp 97.7 F (36.5 C) (Oral)   Resp (!) 22   Ht 5\' 6"  (1.676 m)   Wt 68 kg   SpO2 100%   BMI 24.21 kg/m  Physical Exam General: Sleepy/drowsy appearing male, lying in bed.  HEENT: Pinpoint pupils bilaterally, Sclera anicteric, MMM, trachea midline.  Cardiology: RRR, no murmurs/rubs/gallops. BL radial and DP pulses equal bilaterally.  Resp: Normal respiratory rate and effort. CTAB, no wheezes, rhonchi, crackles.  Abd: Soft, non-tender, non-distended. No rebound tenderness or guarding.  GU: Deferred. MSK: No peripheral edema or signs of trauma. Extremities without deformity or TTP. No cyanosis or clubbing. Skin: warm, dry. No rashes or lesions. Neuro: A&Ox2, CNs II-XII grossly intact. MAEs. Sensation grossly intact.  Psych: Obtunded affect.   ED Results / Procedures / Treatments   Labs (all labs ordered are listed, but only abnormal results are displayed) Labs Reviewed  CBC WITH DIFFERENTIAL/PLATELET - Abnormal; Notable for the following components:      Result Value   RBC 2.61 (*)    Hemoglobin 8.8 (*)    HCT 26.7 (*)    MCV 102.3 (*)    Platelets 125 (*)    All other components within normal limits  COMPREHENSIVE  METABOLIC PANEL - Abnormal; Notable for the following components:   Sodium 134 (*)    Potassium 5.8 (*)    Chloride 89 (*)    BUN 49 (*)    Creatinine, Ser 11.70 (*)    GFR, Estimated 4 (*)    Anion gap 17 (*)    All other components within normal limits  MAGNESIUM - Abnormal; Notable for the following components:   Magnesium 2.5 (*)    All other components within normal limits  LACTIC ACID, PLASMA - Abnormal; Notable for the following components:   Lactic Acid, Venous 2.0 (*)    All other components within normal limits  I-STAT VENOUS BLOOD GAS, ED - Abnormal; Notable for the following components:   Bicarbonate 32.4 (*)    TCO2 34 (*)    Acid-Base Excess 6.0 (*)    Sodium 132 (*)  Potassium 6.6 (*)    Calcium, Ion 1.10 (*)    HCT 27.0 (*)    Hemoglobin 9.2 (*)    All other components within normal limits  URINALYSIS, ROUTINE W REFLEX MICROSCOPIC  RAPID URINE DRUG SCREEN, HOSP PERFORMED  ETHANOL  LACTIC ACID, PLASMA  SALICYLATE LEVEL  ACETAMINOPHEN LEVEL  CBG MONITORING, ED    EKG EKG Interpretation  Date/Time:  Tuesday November 24 2022 09:50:24 EST Ventricular Rate:  63 PR Interval:  236 QRS Duration: 165 QT Interval:  460 QTC Calculation: 471 R Axis:   -65 Text Interpretation: Sinus rhythm Prolonged PR interval RBBB and LAFB TW abnormalities in lateral leads similar to prior EKGs Confirmed by Cindee Lame [phone]) on 11/24/2022 10:24:31 AM  Radiology CT Head Wo Contrast  Result Date: 11/24/2022 CLINICAL DATA:  Mental status changes. EXAM: CT HEAD WITHOUT CONTRAST TECHNIQUE: Contiguous axial images were obtained from the base of the skull through the vertex without intravenous contrast. RADIATION DOSE REDUCTION: This exam was performed according to the departmental dose-optimization program which includes automated exposure control, adjustment of the mA and/or kV according to patient size and/or use of iterative reconstruction technique. COMPARISON:  11/25/2021  FINDINGS: Brain: There is no intra or extra-axial fluid collection or mass lesion. The basilar cisterns and ventricles have a normal appearance. There is no CT evidence for acute infarction or hemorrhage. Chronic LEFT cerebellar infarct appears stable. Vascular: No hyperdense vessel or unexpected calcification. Skull: Normal. Negative for fracture or focal lesion. Sinuses/Orbits: Similar, significant RIGHT mastoid effusion, extending into the petrous apex. LEFT mastoid air cells are normal. Paranasal sinuses and orbits are unremarkable. Other: None IMPRESSION: 1. No evidence for acute intracranial abnormality. 2. Chronic LEFT cerebellar infarct. 3. Similar, significant RIGHT mastoid effusion, extending into the petrous apex. Electronically Signed   By: Nolon Nations M.D.   On: 11/24/2022 14:03    Procedures Procedures    Medications Ordered in ED Medications  lactated ringers bolus 500 mL (has no administration in time range)    ED Course/ Medical Decision Making/ A&P                          Medical Decision Making Amount and/or Complexity of Data Reviewed Labs: ordered. Decision-making details documented in ED Course. Radiology: ordered. Decision-making details documented in ED Course. ECG/medicine tests: ordered.  Risk Decision regarding hospitalization.    This patient presents to the ED for concern of AMS, this involves an extensive number of treatment options, and is a complaint that carries with it a high risk of complications and morbidity.  I considered the following differential and admission for this acute, potentially life threatening condition.   MDM:    Ddx of acute altered mental status or encephalopathy considered but not limited to: -With patient's pinpoint pupils, considering likely an opiate overdose for this patient. He is respirating well at this time with no hypoxia or significant bradypnea. He has a history of buprenorphine in his chart and has percocet as a  historical med on his med list. Since patient is breathing on his own well will not emergently administer narcan to avoid acute withdrawal or pulm edema. -Intracranial abnormalities such as ICH, hydrocephalus, head trauma - there is no e/o head trauma on exam, though patient cannot give any history, will obtain CTH to r/o. -Infection such as UTI, PNA; patient is afebrile and not tachycardic, lower c/f sepsis at this time but will CTM.  -Other toxic ingestion such  as EtOH, benzodiazepines or polypharmacy; patient has remeron, amitriptyline, buprenorphine, percocet on his med list, though he cannot tell me what is he currently taking -Electrolyte abnormalities or hyper/hypoglycemia -Hypercarbia or hypoxia -Hepatic encephalopathy or uremia -ACS or arrhythmia   Clinical Course as of 11/24/22 1531  Tue Nov 24, 2022  0951 Glucose-Capillary: 92 [HN]  1112 Hemoglobin(!): 8.8 Down from 12.2 3 months ago [HN]  1112 WBC: 5.4 No leukocytosis [HN]  1341 RN called CT to find out delay in [facility], patient is in queue [HN]  1420 CT Head Wo Contrast 1. No evidence for acute intracranial abnormality. 2. Chronic LEFT cerebellar infarct. 3. Similar, significant RIGHT mastoid effusion, extending into the petrous apex.   [HN]  1442 Lactic Acid, Venous(!!): 2.0 Giving fluids. Awaiting other labs. Tech was having trouble obtaining further labs due to difficult stick. Patient does not make urine. [HN]  1500 Patient reevaluated. He is slightly more awake than before but still extremely drowsy, requiring constant attention to stay awake. Denies drug use, denies CP, SOB, denies opiates at home. [HN]  1519 Consulted to family practice for admission [HN]  1529 Admitted to hospital, will talk w/ nephrology [HN]    Clinical Course User Index [HN] Audley Hose, MD    Labs: I Ordered, and personally interpreted labs.  The pertinent results include:  those lsited above  Imaging Studies ordered: I ordered imaging  studies including CTH I independently visualized and interpreted imaging. I agree with the radiologist interpretation  Additional history obtained from chart review.    Cardiac Monitoring: The patient was maintained on a cardiac monitor.  I personally viewed and interpreted the cardiac monitored which showed an underlying rhythm of: NSR  Reevaluation: After the interventions noted above, I reevaluated the patient and found that they have :stayed the same  Social Determinants of Health: Patient lives independently  Disposition:  Admit  Co morbidities that complicate the patient evaluation  Past Medical History:  Diagnosis Date   Anemia    Anxiety    Arthritis    Depression    Dyspnea    "only when I have too much fluid"   ED (erectile dysfunction)    tx w/edex   ESRD (end stage renal disease) on dialysis ([place])    "[place]. [place]; TTS" (10/06/2018)   GERD (gastroesophageal reflux disease)    Hearing loss of both ears    wears hearing aids   Hepatitis C    tx w/Epclusa   History of anemia due to CKD    Hypertension    Myoclonic jerking    Pneumonia    Renal disorder    Stroke ([place])    bilat leg weakness residual- "years ago" - has weakness at times   Wears glasses    Wears partial dentures    lower     Medicines Meds ordered this encounter  Medications   DISCONTD: lactated ringers bolus 1,000 mL   lactated ringers bolus 500 mL    I have reviewed the patients home medicines and have made adjustments as needed  Problem List / ED Course: Problem List Items Addressed This Visit       Other   Hyperkalemia   Other Visit Diagnoses     Glasgow coma scale total score 9-12, at arrival to emergency department    -  Primary   Anemia, unspecified type                       This  Bell was created using dictation software, which may contain spelling or grammatical errors.    Audley Hose, MD 11/24/22 [phone]

## 2022-11-24 NOTE — Assessment & Plan Note (Signed)
Home meds include amlodipine 10mg  daily, hydralazine 100 BID, metop 50 BID. Will hold given NPO status. Will await nephro recs, I suspect dialysis to also drop BP.

## 2022-11-24 NOTE — Progress Notes (Signed)
FMTS Interim Progress Note  S:Went to check on patient at bedside alongside Dr. Sabra Heck, patient was resting comfortably and I did not wake him.   O: BP (!) 176/73 (BP Location: Left Arm)   Pulse 62   Temp 98 F (36.7 C) (Oral)   Resp 17   Ht 5\' 6"  (1.676 m)   Wt 68 kg   SpO2 99%   BMI 24.21 kg/m   General: Patient sleeping comfortably, in no acute distress. Resp: normal work of breathing noted without signs of respiratory distress, breathing comfortably on room air   A/P: Patient admitted for altered mental status, etiology still unclear although his hypertension and hypervolemia could be contributing to this Nephrology already consulted earlier for dialysis. Vitals and orders reviewed. Continue to monitor BP, home antihypertensives ordered and hesitant to add more as patient will be getting dialysis soon. Will continue to monitor closely. NPO until more alert and able to pass bedside swallow. Remainder of plan per day team.   Donney Dice, DO 11/24/2022, 8:43 PM PGY-3, [facility] Service pager [phone]

## 2022-11-24 NOTE — Hospital Course (Addendum)
Pinpoint pupils all day Dialysis pt?  Nephro not consulted yet  Labs havent been done yet due to confusion?  Hasn't needed narcan.    Called patient's son (who works nights) who states that he was aloof and showing signs of possible delirium. He was able to answer questions prior to him going to work but then was making "crazy phone calls" saying he was talking to deceased family. When son saw him he was talking to himself. This has happened before but not to this degree. When he called EMS he was sitting in the kitchen with the broom with unfocused gaze. Lives with son, but son works at night He missed his dialysis session, has finished his HD sessions recently  Smokes a few cigarettes per day Denies alcohol use Has used fentanyl patch in the past, has not used recently

## 2022-11-24 NOTE — Progress Notes (Signed)
Attempted to do admission questions but could not complete due to patient being very sleepy and confused.

## 2022-11-24 NOTE — Assessment & Plan Note (Addendum)
Pt now back to neurologic baseline after dialysis. Pt denies any changes in medications or missing dialysis. Notably, he did have myoclonic twitching of wrist ~2wks prior to this admission. This is similar to his 10/22/2020 admission for encephalopathy and similar myoclonic twitching in all 4 ext. During 2021 admission, he improved w/ control of HTN and dialysis. He also discontinued gabapentin and valtrex after that admission as potential triggers. Current differential includes HTN encephalopathy, metabolic encephalopathy 2/2 ESRD, medication toxicity, seizure, hypoglycemia.  - Tx HTN (see below) - Nephrology consulted, plan to cont dialysis while admitted - Plan to review med list for potential triggers - f/u EEG - Consider Neuro consult if  - f/u UDS (if producing urine), B1 - Unremarkable results for ethanol, salicylate, acetaminophen, TSH, RPR, HIV, b12

## 2022-11-24 NOTE — ED Notes (Signed)
Called lab to verify al pt lab tubes were collected and sent to lab. Lab stated pt tubes were in the lab and processing now.

## 2022-11-24 NOTE — ED Notes (Signed)
Unable to obtain enough of blood specimen for Istat. Lab tech attempting to collect specimen.

## 2022-11-24 NOTE — ED Notes (Signed)
Pt back accessed and a Buprenorphine 68mcg/hr patch was found. Patch removed with MD approval.

## 2022-11-24 NOTE — H&P (Addendum)
Hospital Admission History and Physical Service Pager: [phone]  [name] Medical record number: [id number] Date of Birth: 01-17-1956 Age: 67 y.o. Gender: male  Primary Care Provider: Roselee Nova, MD Consultants: Nephrology  Code Status: Full Preferred Emergency Contact:   Name Relation Home Work Mobile   Kamran, Coker   [phone]    Chief Complaint: Altered mental status  Assessment and Plan: Maurice Bell is a 67 y.o. male presenting with acute AMS. Differential includes uremic encephalopathy, opiate overdose, hepatic encephalopathy (unlikely given no diagnosis of liver disease other than hx of treated Hep C), stroke (unlikely due to lack of focality on exam).  * AMS (altered mental status) 1 day hx of acute progressive AMS, confusion, and somnolence. Differential for this patient's presentation of this includes Opioid toxicity, uremia/electrolyte abnormality, other substance toxicities, hypertensive encephalopathy, hepatic encephalopathy. Opioid is considered given pinpoint pupils on exam, prescribed buprenorphine (patch was found on pt) and oxy. Uremia/electrolyte abnormalities are considered given pt is ESRD on dialysis presenting w/ lab abnormality, and prior admission for uremic encephalopathy in 10/22/2020. Hypertensive encephalopathy is considered given elevated BP on admission (max 202/79 in ED). Hepatic encephalopathy is considered given hx of HepC, but less likely since this was treated and liver labs wnl.  - Admit to [facility] Med Surg w/ attending Dr. Gwendlyn Deutscher - Nephrology consulted, appreciate recs. Will likely need dialysis for electrolyte abnormalities and uremia.  - f/u UDS (if producing urine), ethanol, salicylate, acetaminophen levels - Tx BP (see below) - Curbsided Infectious Disease (Dr Gale Journey), would wait to workup possible recurrent Hep C if other workup is negative.  - AM CMP, Mg, Phos, CBC - Consider narcan if resp status  declines. Currently not indicated. - Vitals per unit - NPO until more awake, then bedside swallow  Hypertension Home meds include amlodipine 10mg  daily, hydralazine 100 BID, metop 50 BID. Will hold given NPO status. Will await nephro recs, I suspect dialysis to also drop BP.   ESRD (end stage renal disease) ([place]) On dialysis T/Th/Sa. Per son, he did go to Saturday dialysis and only missed this AM session. Admitted w/ electrolyte derangement including hyponatremia, hyperkalemia, hypermag, uremia. Prior admission for uremic encephalopathy was due to only missing 1 session of dialysis.  - Nephrology consulted, appreciate recs. - AM CMP, Mg, Phos   Chronic Conditions: Plan to restart home meds after med rec. Unable to med rec due to mental status.  FEN/GI: NPO, pending mental status improvement and swallow study VTE Prophylaxis: Heparin SubQ  Disposition: Med-surg, observation  History of Present Illness:  Maurice Bell is a 67 y.o. male presenting with altered mental status. History limited due to pt somnolence and potential confusion. Denies missing dialysis, says his last session was "Tuesday morning". Denies any new substance use.  Called patient's son, Nihaal. Son lives w/ pt but he works nights. Says that dad was acting confused last night, but still able to answer orientation questions for son. Then was apparently making strange phone calls to family members about speaking with relatives (that are no longer alive). When son got home from work this AM, he saw pt talking to himself and had an unfocused gaze. Son then called EMS. Son reports that pt is usually very consistent with going to dialysis.  In the ED, pt was somnolent and noted to have pinpoint pupils, but otherwise breathing well on RA.  Labs notable for hyperkalemia 5.8, anion gap 17, elevated Mg 2.5. Lactic acid elevated 2.0.  CT head neg for acute changes. Received LR 500cc bolus. Nephrology was consulted. Buprenorphine patch  was found on pt body and removed.   Pertinent Past Medical History: ESRD on Dialysis ([facility]) HTN Hx of HepC (s/p Epclusa 01/2020) Anemia of chronic dz GERD  Remainder reviewed in history tab.  Pertinent Past Surgical History: Penile implant   Remainder reviewed in history tab.  Pertinent Social History: Lives with son Fier)  Pertinent Family History: Mother: HTN, Diabetes Father: HTN, diabetes  Remainder reviewed in history tab.   Important Outpatient Medications: Metoprolol Hydralyzine Amlodipine ASA Buprenorphine Mirtazipine Percocet Zoloft  Remainder reviewed in medication history.   Objective: BP (!) 192/72   Pulse 61   Temp 97.7 F (36.5 C) (Oral)   Resp 12   Ht 5\' 6"  (1.676 m)   Wt 68 kg   SpO2 100%   BMI 24.21 kg/m  Exam: General: Elderly older man laying in bed. NAD.  Neuro: Somnolent, but awakened when shaken shoulder. Will speak for short period, then fall back asleep, required frequent reawakening. Oriented to self, place (hospital), and year. Unable to state why he is admitted or how he was brought in. Grip strength 2+ BL hands. Face symmetric. No focal strength deficit (exam limited by somnolence) HEENT: Pupils pinpoint bilaterally and minimally responsive to light. NCAT. Cardiovascular: RRR, no murmurs.  Respiratory: CTAB, no wheezing or crackles. Normal WOB on RA.  Gastrointestinal: Soft, nontender, nondistended. Normal BS. Umbilical surgical scar well healed.  Labs:  CBC BMET  Recent Labs  Lab 11/24/22 1020 11/24/22 1527  WBC 5.4  --   HGB 8.8* 9.2*  HCT 26.7* 27.0*  PLT 125*  --    Recent Labs  Lab 11/24/22 1020 11/24/22 1527  NA 134* 132*  K 5.8* 6.6*  CL 89*  --   CO2 28  --   BUN 49*  --   CREATININE 11.70*  --   GLUCOSE 89  --   CALCIUM 9.4  --     - Lactic acid 2.0  EKG: My own interpretation: NSR w/ RBB, no ST changes.  Imaging Studies Performed: CT 1. No evidence for acute intracranial abnormality. 2.  Chronic LEFT cerebellar infarct. 3. Similar, significant RIGHT mastoid effusion, extending into the petrous apex.  Arlyce Dice, MD 11/24/2022, 5:18 PM PGY-1, [place] Intern pager: [phone], text pages welcome Secure chat group [place] Upper-Level Resident Addendum   I have independently interviewed and examined the patient. I have discussed the above with the original author and agree with their documentation. My edits for correction/addition/clarification are in within the document. Please see also any attending notes.   Rise Patience, DO  PGY-3, [facility] 11/24/2022 6:21 PM  [place] Service pager: [phone] (text pages welcome through [facility])

## 2022-11-25 ENCOUNTER — Observation Stay (HOSPITAL_COMMUNITY)
Admit: 2022-11-25 | Discharge: 2022-11-25 | Disposition: A | Discharge status: Home / Self Care | Payer: 59 | Attending: Family Medicine | Admitting: Family Medicine

## 2022-11-25 DIAGNOSIS — N186 End stage renal disease: Secondary | ICD-10-CM | POA: Diagnosis not present

## 2022-11-25 DIAGNOSIS — R410 Disorientation, unspecified: Secondary | ICD-10-CM | POA: Diagnosis not present

## 2022-11-25 DIAGNOSIS — R251 Tremor, unspecified: Secondary | ICD-10-CM

## 2022-11-25 DIAGNOSIS — Z1152 Encounter for screening for COVID-19: Secondary | ICD-10-CM | POA: Diagnosis not present

## 2022-11-25 DIAGNOSIS — R4182 Altered mental status, unspecified: Secondary | ICD-10-CM | POA: Diagnosis not present

## 2022-11-25 DIAGNOSIS — I120 Hypertensive chronic kidney disease with stage 5 chronic kidney disease or end stage renal disease: Secondary | ICD-10-CM | POA: Diagnosis not present

## 2022-11-25 LAB — PHOSPHORUS: Phosphorus: 3.3 mg/dL (ref 2.5–4.6)

## 2022-11-25 LAB — CBC
HCT: 28.4 % — ABNORMAL LOW (ref 39.0–52.0)
Hemoglobin: 9.1 g/dL — ABNORMAL LOW (ref 13.0–17.0)
MCH: 32.5 pg (ref 26.0–34.0)
MCHC: 32 g/dL (ref 30.0–36.0)
MCV: 101.4 fL — ABNORMAL HIGH (ref 80.0–100.0)
Platelets: 131 10*3/uL — ABNORMAL LOW (ref 150–400)
RBC: 2.8 MIL/uL — ABNORMAL LOW (ref 4.22–5.81)
RDW: 14.2 % (ref 11.5–15.5)
WBC: 5.5 10*3/uL (ref 4.0–10.5)
nRBC: 0 % (ref 0.0–0.2)

## 2022-11-25 LAB — MRSA NEXT GEN BY PCR, NASAL: MRSA by PCR Next Gen: NOT DETECTED

## 2022-11-25 LAB — VITAMIN B12: Vitamin B-12: 413 pg/mL (ref 180–914)

## 2022-11-25 LAB — COMPREHENSIVE METABOLIC PANEL
ALT: 10 U/L (ref 0–44)
AST: 22 U/L (ref 15–41)
Albumin: 3.6 g/dL (ref 3.5–5.0)
Alkaline Phosphatase: 42 U/L (ref 38–126)
Anion gap: 12 (ref 5–15)
BUN: 23 mg/dL (ref 8–23)
CO2: 29 mmol/L (ref 22–32)
Calcium: 8.9 mg/dL (ref 8.9–10.3)
Chloride: 94 mmol/L — ABNORMAL LOW (ref 98–111)
Creatinine, Ser: 6.92 mg/dL — ABNORMAL HIGH (ref 0.61–1.24)
GFR, Estimated: 8 mL/min — ABNORMAL LOW (ref >60)
Glucose, Bld: 106 mg/dL — ABNORMAL HIGH (ref 70–99)
Potassium: 3.8 mmol/L (ref 3.5–5.1)
Sodium: 135 mmol/L (ref 135–145)
Total Bilirubin: 0.6 mg/dL (ref 0.3–1.2)
Total Protein: 6.4 g/dL — ABNORMAL LOW (ref 6.5–8.1)

## 2022-11-25 LAB — HEMOGLOBIN A1C
Hgb A1c MFr Bld: 4.5 % — ABNORMAL LOW (ref 4.8–5.6)
Mean Plasma Glucose: 82.45 mg/dL

## 2022-11-25 LAB — HEPATITIS B SURFACE ANTIBODY, QUANTITATIVE: Hep B S AB Quant (Post): 12.6 m[IU]/mL (ref >9.9)

## 2022-11-25 LAB — HIV ANTIBODY (ROUTINE TESTING W REFLEX): HIV Screen 4th Generation wRfx: NONREACTIVE

## 2022-11-25 LAB — RESP PANEL BY RT-PCR (RSV, FLU A&B, COVID)  RVPGX2
Influenza A by PCR: NEGATIVE
Influenza B by PCR: NEGATIVE
Resp Syncytial Virus by PCR: NEGATIVE
SARS Coronavirus 2 by RT PCR: NEGATIVE

## 2022-11-25 LAB — TSH: TSH: 0.672 u[IU]/mL (ref 0.350–4.500)

## 2022-11-25 LAB — MAGNESIUM: Magnesium: 1.9 mg/dL (ref 1.7–2.4)

## 2022-11-25 LAB — RPR: RPR Ser Ql: NONREACTIVE

## 2022-11-25 MED ORDER — ACETAMINOPHEN 325 MG PO TABS
650.0000 mg | ORAL_TABLET | Freq: Four times a day (QID) | ORAL | Status: DC | PRN
  Administered 2022-11-25: 650 mg via ORAL
  Filled 2022-11-25: qty 2

## 2022-11-25 NOTE — Progress Notes (Signed)
.  Parikh, MD

## 2022-11-25 NOTE — Procedures (Signed)
Patient Name: Oday Ridings  MRN: [id number]  Epilepsy Attending: Lora Havens  Referring Physician/Provider: August Albino, MD  Date: 11/25/2022 Duration: 22.25 mins  Patient history: 67yo M with ams. EEG to evaluate for seizure  Level of alertness: awake, asleep  AEDs during EEG study: None  Technical aspects: This EEG study was done with scalp electrodes positioned according to the 10-20 International system of electrode placement. Electrical activity was reviewed with band pass filter of 1-70Hz , sensitivity of 7 uV/mm, display speed of 24mm/sec with a 60Hz  notched filter applied as appropriate. EEG data were recorded continuously and digitally stored.  Video monitoring was available and reviewed as appropriate.  Description: The posterior dominant rhythm consists of 8 Hz activity of moderate voltage (25-35 uV) seen predominantly in posterior head regions, symmetric and reactive to eye opening and eye closing. Sleep was characterized by vertex waves, maximal frontocentral region. Hyperventilation and photic stimulation were not performed.     IMPRESSION: This study is within normal limits. No seizures or epileptiform discharges were seen throughout the recording.  Layton Tappan Barbra Sarks

## 2022-11-25 NOTE — Progress Notes (Signed)
  Transition of Care [facility]) Screening Note   Patient Details  Name: Maurice Bell Date of Birth: 09-Feb-1956   Transition of Care [facility]) CM/SW Contact:    Tom-Johnson, Renea Ee, RN Phone Number: 11/25/2022, 4:40 PM  Transition of Care Department [facility]) has reviewed patient and no TOC needs or recommendations have been identified at this time. TOC will continue to monitor patient advancement through interdisciplinary progression rounds. If new patient transition needs arise, please place a TOC consult.

## 2022-11-25 NOTE — Progress Notes (Signed)
Daily Progress Note Intern Pager: [phone]  [name] Medical record number: [id number] Date of birth: January 23, 1956 Age: 67 y.o. Gender: male  Primary Care Provider: Roselee Nova, MD Consultants: None Code Status: Full  Pt Overview and Major Events to Date:  1/23 Admitted  Assessment and Plan: BW is a 67yo M admitted for AMS and myoclonus.   Pertinent PMH/PSH includes ESRD on HD, Hep C, HTN, and CVA.   * AMS (altered mental status) Pt now back to neurologic baseline after dialysis. Pt denies any changes in medications or missing dialysis. Notably, he did have myoclonic twitching of wrist ~2wks prior to this admission. This is similar to his 10/22/2020 admission for encephalopathy and similar myoclonic twitching in all 4 ext. During 2021 admission, he improved w/ control of HTN and dialysis. He also discontinued gabapentin and valtrex after that admission as potential triggers. Current differential includes HTN encephalopathy, metabolic encephalopathy 2/2 ESRD, medication toxicity, seizure, hypoglycemia.  - Tx HTN (see below) - Nephrology consulted, plan to cont dialysis while admitted - Plan to review med list for potential triggers - f/u EEG - Consider Neuro consult if  - f/u UDS (if producing urine), B1 - Unremarkable results for ethanol, salicylate, acetaminophen, TSH, RPR, HIV, b12  Hypertension Home meds include amlodipine 10mg  daily, hydralazine 100 BID, metop 50 BID. Will hold given NPO status. Will await nephro recs, I suspect dialysis to also drop BP.   ESRD (end stage renal disease) ([place]) On dialysis T/Th/Sa. Per son, he did go to Saturday dialysis and only missed this AM session. Admitted w/ electrolyte derangement including hyponatremia, hyperkalemia, hypermag, uremia. Prior admission for uremic encephalopathy was due to only missing 1 session of dialysis.  - Nephrology consulted, appreciate recs. - AM CMP, Mg, Phos   FEN/GI: heart  healthy PPx: SQ Hep Dispo:Home tomorrow. Barriers include workup of unexplained encephalopathy.   Subjective:  Pt is alert and oriented this morning. Reports that he remembers going to dialysis Saturday, going to church Sunday. The last thing he remembers is his son going to work the night before this admission. Denies taking any new medications, changing how he takes his meds, missing dialysis, or any reason to precipitate this AMS event. He reports that during his 2021 admission for similar uremic encephalopathy, his doctors determined that his valtrex and gabapentin were both potential medications that could cause AMS and clonus in an ESRD pt, so those medications were stopped. He denies taking any of those medications recently.   He also reports that he started to have hand twitching ~2 weeks ago, but didn't tell anyone. He had similar hand twitching during his 2021 admission.   Objective: Temp:  [97.6 F (36.4 C)-98.6 F (37 C)] 98.5 F (36.9 C) (01/24 0840) Pulse Rate:  [54-74] 65 (01/24 0840) Resp:  [0-30] 17 (01/24 0840) BP: (100-189)/(61-82) 110/61 (01/24 0840) SpO2:  [98 %-100 %] 100 % (01/24 0840) Weight:  [52.1 kg] 52.1 kg (01/24 0153) Physical Exam: General: Alert, laying comfortably in bed. Responding appropriately in full sentences. NAD. Neuro: A&Ox3. Occasional myoclonic twitch of L wrist Cardiovascular: RRR Respiratory: CTAB, normal WOB on RA. Abdomen: Soft, nontender, nondistended. Normal BS.  Laboratory: Most recent CBC Lab Results  Component Value Date   WBC 5.5 11/25/2022   HGB 9.1 (L) 11/25/2022   HCT 28.4 (L) 11/25/2022   MCV 101.4 (H) 11/25/2022   PLT 131 (L) 11/25/2022   Most recent BMP    Latest Ref Rng &  Units 11/25/2022    6:40 AM  BMP  Glucose 70 - 99 mg/dL 106   BUN 8 - 23 mg/dL 23   Creatinine 0.61 - 1.24 mg/dL 6.92   Sodium 135 - 145 mmol/L 135   Potassium 3.5 - 5.1 mmol/L 3.8   Chloride 98 - 111 mmol/L 94   CO2 22 - 32 mmol/L 29    Calcium 8.9 - 10.3 mg/dL 8.9     Arlyce Dice, MD 11/25/2022, 5:22 PM  PGY-1, [place] Intern pager: [phone], text pages welcome Secure chat group [place]

## 2022-11-25 NOTE — Care Management Obs Status (Signed)
[place] NOTIFICATION   Patient Details  Name: Maurice Bell MRN: [id number] Date of Birth: 16-Oct-1956   Medicare Observation Status Notification Given:  Yes    Tom-Johnson, Renea Ee, RN 11/25/2022, 4:39 PM

## 2022-11-25 NOTE — Progress Notes (Signed)
  [facility] Progress Note   Subjective: Seen in room. Had dialysis last night. K improved. Mentation much better this am. He says he felt the tremors starting a few days ago. Reports the same thing happened to him when he was taking Valtrex which he is no longer taking. He doesn't think he's taking any new medications.   Objective Vitals:   11/25/22 0153 11/25/22 0237 11/25/22 0626 11/25/22 0840  BP: (!) 153/74 127/77 116/64 110/61  Pulse: 74 74 64 65  Resp: 16 16 15 17   Temp:  98.6 F (37 C) 98.3 F (36.8 C) 98.5 F (36.9 C)  TempSrc:  Oral Oral Oral  SpO2: 100% 100% 100% 100%  Weight: 52.1 kg     Height:        Physical Exam General: Alert, nad Heart: RRR  Lungs: Clear bilaterally  Abdomen: soft non-tender Extremities: non -tender Dialysis Access: R AVF +bruit    Additional Objective Labs: Basic Metabolic Panel: Recent Labs  Lab 11/24/22 1020 11/24/22 1527 11/24/22 1540 11/25/22 0640  NA 134* 132* 135 135  K 5.8* 6.6* 6.7* 3.8  CL 89*  --  90* 94*  CO2 28  --  31 29  GLUCOSE 89  --  84 106*  BUN 49*  --  49* 23  CREATININE 11.70*  --  12.12* 6.92*  CALCIUM 9.4  --  9.0 8.9  PHOS  --   --  5.2* 3.3   CBC: Recent Labs  Lab 11/24/22 1020 11/24/22 1527 11/25/22 0640  WBC 5.4  --  5.5  NEUTROABS 4.1  --   --   HGB 8.8* 9.2* 9.1*  HCT 26.7* 27.0* 28.4*  MCV 102.3*  --  101.4*  PLT 125*  --  131*   Blood Culture    Component Value Date/Time   SDES BLOOD BLOOD LEFT HAND 09/29/2018 0915   SPECREQUEST  09/29/2018 0915    BOTTLES DRAWN AEROBIC AND ANAEROBIC Blood Culture adequate volume   CULT  09/29/2018 0915    NO GROWTH 5 DAYS Performed at [facility] [address] [place], [place] [address]    REPTSTATUS 10/04/2018 FINAL 09/29/2018 0915   Medications:   amLODipine  10 mg Oral Daily   Chlorhexidine Gluconate Cloth  6 each Topical Q0600   heparin  5,000 Units Subcutaneous Q8H   hydrALAZINE  100 mg Oral BID   metoprolol  tartrate  150 mg Oral BID   sodium zirconium cyclosilicate  10 g Oral Once    Dialysis Orders:  [facility] 3:15 450  53.8kg 2K/2Ca UFP 4 AVF No heparin  -Mircera 60 q 4 wks (last 10/27/22) -Hectorol 3 TIW    Assessment/Plan: AMS - Head CT negative. Suspect medication toxicity - pt reports similar event when he was taking Valtrex. Felt tremors starting a few days prior. Unclear cause this time, he says he isn't taking anything new.  Hyperkalemia - Resolved with HD. K+ 3.8  ESRD -  HD TTS. Continue on schedule. Next HD 1/25\ Hypertension/volume  - Severe HTN on admit. Continue home meds (amlodipine, hydralazine, metoprolol. UF 2-3L with HD tonight. Much improved.  Anemia  - Hb 9.2. On ESA as outpatient. Follow trends  Metabolic bone disease -  Ca/Phos ok. Continue home binders  Lynnda Child PA-C [facility] 11/25/2022,11:45 AM

## 2022-11-26 DIAGNOSIS — R4182 Altered mental status, unspecified: Secondary | ICD-10-CM | POA: Diagnosis not present

## 2022-11-26 LAB — RENAL FUNCTION PANEL
Albumin: 3.2 g/dL — ABNORMAL LOW (ref 3.5–5.0)
Anion gap: 11 (ref 5–15)
BUN: 39 mg/dL — ABNORMAL HIGH (ref 8–23)
CO2: 28 mmol/L (ref 22–32)
Calcium: 8.6 mg/dL — ABNORMAL LOW (ref 8.9–10.3)
Chloride: 96 mmol/L — ABNORMAL LOW (ref 98–111)
Creatinine, Ser: 9.25 mg/dL — ABNORMAL HIGH (ref 0.61–1.24)
GFR, Estimated: 6 mL/min — ABNORMAL LOW (ref >60)
Glucose, Bld: 88 mg/dL (ref 70–99)
Phosphorus: 4.9 mg/dL — ABNORMAL HIGH (ref 2.5–4.6)
Potassium: 4.6 mmol/L (ref 3.5–5.1)
Sodium: 135 mmol/L (ref 135–145)

## 2022-11-26 MED ORDER — PENTAFLUOROPROP-TETRAFLUOROETH EX AERO: 1.0000 | INHALATION_SPRAY | CUTANEOUS | Status: DC | PRN

## 2022-11-26 MED ORDER — LIDOCAINE-PRILOCAINE 2.5-2.5 % EX CREA: 1.0000 | TOPICAL_CREAM | CUTANEOUS | Status: DC | PRN

## 2022-11-26 MED ORDER — LIDOCAINE HCL (PF) 1 % IJ SOLN: 5.0000 mL | INTRAMUSCULAR | Status: DC | PRN

## 2022-11-26 NOTE — Plan of Care (Signed)

## 2022-11-26 NOTE — Progress Notes (Signed)
Pt return from dialysis. Alert and oriented. RN assisted pt to bathroom.

## 2022-11-26 NOTE — Progress Notes (Signed)
Discharge instructions (including medications) discussed with and copy provided to patient/caregiver 

## 2022-11-26 NOTE — Discharge Summary (Addendum)
[facility] Discharge Summary  [name] Medical record number: [id number] Date of birth: 01/13/1956 Age: 67 y.o. Gender: male Date of Admission: 11/24/2022  Date of Discharge: 11/26/22 Admitting Physician: Kinnie Feil, MD  Primary Care Provider: Roselee Nova, MD Consultants: Nephro  Indication for Hospitalization: AMS  Brief Hospital Course:  BW is a 67yo M w/ hx of ESRD on dialysis, Hep C, HTN, and CVA that was admitted for AMS and myoclonus.  Altered Mental Status  Myoclonus Pt presented w/ 2 wk hx of twitching in all extremities and then 1 day hx of rapidly progressing altered mental status. Found by son talking to himself at home, called EMS. In ED, noted to have pinpoint pupils but satting well on RA. Pt had some electrolyte abnormalities that improved with dialysis. CT head unremarkable. EEG unremarkable. Pt mental status quickly improved overnight w/ dialysis. Pt still having some myoclonic twitching of extremities at discharge, but was back to baseline mentation.   Of note, pt had similar hospitalization in 10/2020 for myoclonus and AMS that improved with dialysis. At that time, pt was discontinued off gabapentin and Valtrex and subsequently improved. Prior this current admission, pt reports taking more Nyquil to help with sleep. Pt was advised to discontinue taking Diphenhydramine containing medications. Etiology of AMS and myoclonus unclear, leading differential includes medication toxicity (diphenhydramine), insufficient dialysis (despite going consistently), or seizure disorder (not captured on EEG).  ESRD on dialysis (T/Th/Sa) Pt is consistent w/ going to dialysis and denies missing any sessions prior to onset of symptoms. Pt had hyperkalemia, hyponatremia. hypermag. Nephrology was consulted and continued regular T/Th/Sa dialysis while admitted.   PCP Follow-up 1) Please review medications with patient (including  supplements and OTC meds) that may be contributing to myoclonus and AMS. Home medications were not adjusted prior to discharge.  Discharge Diagnoses/Problem List:  AMS, myoclonus, ESRD on dialysis, HTN  Disposition: Home  Discharge Condition: Improved, stable  Discharge Exam:  Gen: Well-appearing, alert. Sitting in bed getting dialysis. NAD. Neuro: A&Ox3 HEENT: NCAT. MMM. CV: RRR Resp: CTAB, no crackles or wheezing. Normal WOB on RA Abm: soft, nontender, nondistended. Normal BS.  Significant Labs and Imaging:  Recent Labs  Lab 11/25/22 0640  WBC 5.5  HGB 9.1*  HCT 28.4*  PLT 131*   Recent Labs  Lab 11/25/22 0640 11/26/22 0432  NA 135 135  K 3.8 4.6  CL 94* 96*  CO2 29 28  GLUCOSE 106* 88  BUN 23 39*  CREATININE 6.92* 9.25*  CALCIUM 8.9 8.6*  MG 1.9  --   PHOS 3.3 4.9*  ALKPHOS 42  --   AST 22  --   ALT 10  --   ALBUMIN 3.6 3.2*    Discharge Medications:  Allergies as of 11/26/2022       Reactions   Vicodin [hydrocodone-acetaminophen] Itching        Medication List     STOP taking these medications    alprostadil 20 MCG injection Commonly known as: EDEX   amitriptyline 10 MG tablet Commonly known as: ELAVIL   APAP-DIPHENHYDRAMINE PO   ciclopirox 8 % solution Commonly known as: Penlac   hydrocortisone 2.5 % rectal cream Commonly known as: ANUSOL-HC   nicotine 14 mg/24hr patch Commonly known as: NICODERM CQ - dosed in mg/24 hours   nicotine 7 mg/24hr patch Commonly known as: NICODERM CQ - dosed in mg/24 hr   nystatin powder Commonly known as: MYCOSTATIN/NYSTOP   sildenafil  50 MG tablet Commonly known as: VIAGRA   SUMAtriptan 50 MG tablet Commonly known as: IMITREX   valACYclovir 500 MG tablet Commonly known as: VALTREX       TAKE these medications    acetaminophen 650 MG CR tablet Commonly known as: TYLENOL Take 1,300 mg by mouth daily as needed for pain.   amLODipine 10 MG tablet Commonly known as: NORVASC Take 10 mg  by mouth daily.   aspirin EC 81 MG tablet Take 81 mg by mouth daily.   buprenorphine 15 MCG/HR Commonly known as: BUTRANS Place 1 patch onto the skin once a week.   hydrALAZINE 100 MG tablet Commonly known as: APRESOLINE Take 100 mg by mouth 2 (two) times daily.   ibuprofen 200 MG tablet Commonly known as: ADVIL Take 400 mg by mouth daily as needed (pain).   iron sucrose 20 MG/ML injection Commonly known as: VENOFER 50 mg.   metoprolol tartrate 50 MG tablet Commonly known as: LOPRESSOR Take 150 mg by mouth 2 (two) times daily.   mirtazapine 7.5 MG tablet Commonly known as: REMERON Take 7.5 mg by mouth at bedtime.   NEPHRO-VITE PO Take 0.8 mg by mouth. What changed: Another medication with the same name was changed. Make sure you understand how and when to take each.   multivitamin Tabs tablet Take 1 tablet by mouth at bedtime. What changed: when to take this   oxyCODONE-acetaminophen 5-325 MG tablet Commonly known as: PERCOCET/ROXICET Take 1 tablet by mouth in the morning, at noon, and at bedtime.   senna 8.6 MG Tabs tablet Commonly known as: SENOKOT Take 1 tablet (8.6 mg total) by mouth daily as needed for mild constipation.   sertraline 50 MG tablet Commonly known as: ZOLOFT Take 50 mg by mouth daily. What changed: Another medication with the same name was removed. Continue taking this medication, and follow the directions you see here.   sevelamer carbonate 800 MG tablet Commonly known as: RENVELA Take 4 tablets (3,200 mg total) by mouth 3 (three) times daily with meals.        Discharge Instructions: Please refer to Patient Instructions section of EMR for full details.  Patient was counseled important signs and symptoms that should prompt return to medical care, changes in medications, dietary instructions, activity restrictions, and follow up appointments.   Follow-Up Appointments: Pt instructed to make follow-up appointment with PCP in next week to  go over medication list.   Arlyce Dice, MD 11/26/2022, 4:11 PM PGY-1, [place] Upper-Level Resident Addendum   I have independently interviewed and examined the patient. I have discussed the above with the original author and agree with their documentation. Please see also any attending notes.   Sonia Side, D.O. PGY-3, [facility] 11/26/2022 5:03 PM  [place] Service pager: [phone] (text pages welcome through [facility])

## 2022-11-26 NOTE — Progress Notes (Addendum)
Pt receives out-pt HD at [facility] on TTS. Will assist as needed.   Melven Sartorius Renal Navigator [phone]  Addendum at 3:40 pm: D/C order noted. Contacted [facility] to advise clinic of pt's d/c today and that pt should resume care on Saturday.

## 2022-11-26 NOTE — Progress Notes (Signed)
Received patient in bed to unit.  Alert and oriented.  Informed consent signed and in chart.   Treatment initiated: 7371 Treatment completed: 1156  Patient tolerated well.  Transported back to the room  Alert, without acute distress.  Hand-off given to patient's nurse.   Access used: AVF Access issues: none  Total UF removed: 1800 Medication(s) given: none Post HD VS: 98.7, 134/70(90), HR-69, RR-23, SP02-100 Post HD weight: 52kg   Pt started cramping in the last 18 min if treatment so fluid pull was held for the remainder of Dialysis. That's the reason for not meeting 2L goal. [facility]

## 2022-11-26 NOTE — Progress Notes (Signed)
  [facility] Progress Note   Subjective: Seen in dialysis. UF goal 2L. No new events overnight. EEG negative. He feels well this am.   Objective Vitals:   11/26/22 0835 11/26/22 0900 11/26/22 0930 11/26/22 1003  BP: (!) 167/71 (!) 146/67 133/66 (!) 153/77  Pulse: 69 68 66 65  Resp: (!) 30 16 15 11   Temp:      TempSrc:      SpO2: 98% 100% 94%   Weight:      Height:        Physical Exam General: Alert, nad Heart: RRR  Lungs: Clear bilaterally  Abdomen: soft non-tender Extremities: non -tender Dialysis Access: R AVF +bruit    Additional Objective Labs: Basic Metabolic Panel: Recent Labs  Lab 11/24/22 1540 11/25/22 0640 11/26/22 0432  NA 135 135 135  K 6.7* 3.8 4.6  CL 90* 94* 96*  CO2 31 29 28   GLUCOSE 84 106* 88  BUN 49* 23 39*  CREATININE 12.12* 6.92* 9.25*  CALCIUM 9.0 8.9 8.6*  PHOS 5.2* 3.3 4.9*    CBC: Recent Labs  Lab 11/24/22 1020 11/24/22 1527 11/25/22 0640  WBC 5.4  --  5.5  NEUTROABS 4.1  --   --   HGB 8.8* 9.2* 9.1*  HCT 26.7* 27.0* 28.4*  MCV 102.3*  --  101.4*  PLT 125*  --  131*    Blood Culture    Component Value Date/Time   SDES BLOOD LEFT ARM 11/25/2022 0648   SPECREQUEST  11/25/2022 0648    BOTTLES DRAWN AEROBIC AND ANAEROBIC Blood Culture adequate volume   CULT  11/25/2022 0648    NO GROWTH < 24 HOURS Performed at [facility], [place] [address] [place], [place] [address]    REPTSTATUS PENDING 11/25/2022 [phone]   Medications:   amLODipine  10 mg Oral Daily   Chlorhexidine Gluconate Cloth  6 each Topical Q0600   heparin  5,000 Units Subcutaneous Q8H   hydrALAZINE  100 mg Oral BID   metoprolol tartrate  150 mg Oral BID   sodium zirconium cyclosilicate  10 g Oral Once    Dialysis Orders:  [facility] 3:15 450  53.8kg 2K/2Ca UFP 4 AVF No heparin  -Mircera 60 q 4 wks (last 10/27/22) -Hectorol 3 TIW    Assessment/Plan: AMS - Head CT negative. Not uremic on admission. Suspect medication toxicity - pt  reports similar event when he was taking Valtrex last year. Felt tremors starting a few days prior. Unclear cause this time, he says he isn't taking anything new. Resolved -back to baseline.  Hyperkalemia - Resolved with HD.  ESRD -  HD TTS. Hasn't missed any HD. Continue on schedule. HD today  Hypertension/volume  - Severe HTN on admit. Continue home meds (amlodipine, hydralazine, metoprolol.  No volume excess. Much improved.  Anemia  - Hb 9.2 On ESA as outpatient. Follow trends  Metabolic bone disease -  Ca/Phos ok. Continue home binders  Lynnda Child PA-C  [facility] 11/26/2022,10:15 AM

## 2022-11-26 NOTE — Discharge Instructions (Addendum)
You were admitted for confusion and twitching that is most likely caused by a medication side effect. I recommend that you STOP taking Nyquil and any other medications that contain Diphenhydramine. Follow-up with your primary doctor in the next week to look over your medication list.   Please seek medical attention if: - You or a family member notices that you have increasing twitching in your arms and legs - You or a family member notices that you are more confused or not acting like your normal self

## 2022-11-30 LAB — CULTURE, BLOOD (ROUTINE X 2)
Culture: NO GROWTH
Culture: NO GROWTH
Special Requests: ADEQUATE
Special Requests: ADEQUATE

## 2022-11-30 LAB — VITAMIN B1: Vitamin B1 (Thiamine): 77.4 nmol/L (ref 66.5–200.0)

## 2023-02-02 ENCOUNTER — Other Ambulatory Visit: Payer: Self-pay

## 2023-02-02 ENCOUNTER — Encounter (HOSPITAL_COMMUNITY): Payer: Self-pay

## 2023-02-02 ENCOUNTER — Emergency Department (HOSPITAL_COMMUNITY): Payer: 59

## 2023-02-02 ENCOUNTER — Emergency Department (HOSPITAL_COMMUNITY)
Admission: EM | Admit: 2023-02-02 | Discharge: 2023-02-02 | Disposition: A | Discharge status: Home / Self Care | Payer: 59 | Attending: Emergency Medicine | Admitting: Emergency Medicine

## 2023-02-02 DIAGNOSIS — R410 Disorientation, unspecified: Secondary | ICD-10-CM | POA: Diagnosis not present

## 2023-02-02 DIAGNOSIS — R531 Weakness: Secondary | ICD-10-CM | POA: Diagnosis not present

## 2023-02-02 DIAGNOSIS — Z7982 Long term (current) use of aspirin: Secondary | ICD-10-CM | POA: Diagnosis not present

## 2023-02-02 DIAGNOSIS — R4182 Altered mental status, unspecified: Secondary | ICD-10-CM | POA: Diagnosis present

## 2023-02-02 DIAGNOSIS — Z992 Dependence on renal dialysis: Secondary | ICD-10-CM | POA: Insufficient documentation

## 2023-02-02 DIAGNOSIS — N186 End stage renal disease: Secondary | ICD-10-CM | POA: Diagnosis not present

## 2023-02-02 LAB — COMPREHENSIVE METABOLIC PANEL
ALT: 15 U/L (ref 0–44)
AST: 33 U/L (ref 15–41)
Albumin: 4.2 g/dL (ref 3.5–5.0)
Alkaline Phosphatase: 63 U/L (ref 38–126)
Anion gap: 16 — ABNORMAL HIGH (ref 5–15)
BUN: 26 mg/dL — ABNORMAL HIGH (ref 8–23)
CO2: 27 mmol/L (ref 22–32)
Calcium: 8.7 mg/dL — ABNORMAL LOW (ref 8.9–10.3)
Chloride: 90 mmol/L — ABNORMAL LOW (ref 98–111)
Creatinine, Ser: 7.02 mg/dL — ABNORMAL HIGH (ref 0.61–1.24)
GFR, Estimated: 8 mL/min — ABNORMAL LOW (ref >60)
Glucose, Bld: 89 mg/dL (ref 70–99)
Potassium: 4.6 mmol/L (ref 3.5–5.1)
Sodium: 133 mmol/L — ABNORMAL LOW (ref 135–145)
Total Bilirubin: 0.6 mg/dL (ref 0.3–1.2)
Total Protein: 7.7 g/dL (ref 6.5–8.1)

## 2023-02-02 LAB — CBC WITH DIFFERENTIAL/PLATELET
Abs Immature Granulocytes: 0.01 10*3/uL (ref 0.00–0.07)
Basophils Absolute: 0 10*3/uL (ref 0.0–0.1)
Basophils Relative: 1 %
Eosinophils Absolute: 0 10*3/uL (ref 0.0–0.5)
Eosinophils Relative: 1 %
HCT: 36.7 % — ABNORMAL LOW (ref 39.0–52.0)
Hemoglobin: 11.9 g/dL — ABNORMAL LOW (ref 13.0–17.0)
Immature Granulocytes: 0 %
Lymphocytes Relative: 13 %
Lymphs Abs: 0.5 10*3/uL — ABNORMAL LOW (ref 0.7–4.0)
MCH: 34.3 pg — ABNORMAL HIGH (ref 26.0–34.0)
MCHC: 32.4 g/dL (ref 30.0–36.0)
MCV: 105.8 fL — ABNORMAL HIGH (ref 80.0–100.0)
Monocytes Absolute: 0.4 10*3/uL (ref 0.1–1.0)
Monocytes Relative: 9 %
Neutro Abs: 3.1 10*3/uL (ref 1.7–7.7)
Neutrophils Relative %: 76 %
Platelets: 103 10*3/uL — ABNORMAL LOW (ref 150–400)
RBC: 3.47 MIL/uL — ABNORMAL LOW (ref 4.22–5.81)
RDW: 16.7 % — ABNORMAL HIGH (ref 11.5–15.5)
WBC: 4.1 10*3/uL (ref 4.0–10.5)
nRBC: 0 % (ref 0.0–0.2)

## 2023-02-02 LAB — ETHANOL: Alcohol, Ethyl (B): <10 mg/dL (ref <10)

## 2023-02-02 LAB — CBG MONITORING, ED: Glucose-Capillary: 82 mg/dL (ref 70–99)

## 2023-02-02 LAB — AMMONIA: Ammonia: 23 umol/L (ref 9–35)

## 2023-02-02 NOTE — ED Triage Notes (Signed)
Pt arrived POV not really communicating with staff but states he is here for dialysis. Pt states he does not know when he last had a treatment.

## 2023-02-02 NOTE — ED Provider Notes (Signed)
[name] Provider Note   CSN: [id number] Arrival date & time: 02/02/23  1120     History {Add pertinent medical, surgical, social history, OB history to HPI:1} Chief Complaint  Patient presents with   missed dialysis    Maurice Bell is a 67 y.o. male.  He is brought in by family member for evaluation of altered mental status.  It is unclear when this started.  Patient has a history of end-stage renal disease and gets dialysis Tuesday Thursday Saturday.  Family thinks he probably missed Saturday.  He drove himself to dialysis today with a manual shifter.  They now find him confused and having poor memory.  He was able to get dialysis today.  Family member states he has had these episodes before sometimes they are related to missing dialysis sometimes they are related to getting dialysis and sometimes they just happen.  Patient himself is awake and will answer some questions but is slow to respond.  He denies any specific complaints.  The history is provided by the patient and a relative.  Altered Mental Status Presenting symptoms: disorientation and memory loss   Most recent episode:  Today Progression:  Unchanged Chronicity:  Recurrent Associated symptoms: no abdominal pain, no fever, no headaches, no nausea and no vomiting        Home Medications Prior to Admission medications   Medication Sig Start Date End Date Taking? Authorizing Provider  acetaminophen (TYLENOL) 650 MG CR tablet Take 1,300 mg by mouth daily as needed for pain.    [provider]  amLODipine (NORVASC) 10 MG tablet Take 10 mg by mouth daily.    [provider]  aspirin EC 81 MG tablet Take 81 mg by mouth daily.    [provider]  B Complex-C-Folic Acid (NEPHRO-VITE PO) Take 0.8 mg by mouth.    [provider]  buprenorphine (BUTRANS) 15 MCG/HR Place 1 patch onto the skin once a week. 07/29/22   [provider]   hydrALAZINE (APRESOLINE) 100 MG tablet Take 100 mg by mouth 2 (two) times daily. 07/09/21   [provider]  ibuprofen (ADVIL) 200 MG tablet Take 400 mg by mouth daily as needed (pain).    [provider]  iron sucrose (VENOFER) 20 MG/ML injection 50 mg. 03/04/21   [provider]  metoprolol tartrate (LOPRESSOR) 50 MG tablet Take 150 mg by mouth 2 (two) times daily.    [provider]  mirtazapine (REMERON) 7.5 MG tablet Take 7.5 mg by mouth at bedtime. 08/20/21   [provider]  multivitamin (RENA-VIT) TABS tablet Take 1 tablet by mouth at bedtime. Patient taking differently: Take 1 tablet by mouth daily. 09/11/18   [name], [name], MD  oxyCODONE-acetaminophen (PERCOCET/ROXICET) 5-325 MG tablet Take 1 tablet by mouth in the morning, at noon, and at bedtime.    [provider]  senna (SENOKOT) 8.6 MG TABS tablet Take 1 tablet (8.6 mg total) by mouth daily as needed for mild constipation. 10/25/20   Alexandria Lodge, MD  sertraline (ZOLOFT) 50 MG tablet Take 50 mg by mouth daily. 05/11/22   [provider]  sevelamer carbonate (RENVELA) 800 MG tablet Take 4 tablets (3,200 mg total) by mouth 3 (three) times daily with meals. 09/11/18   Mendel Corning, MD      Allergies    Vicodin [hydrocodone-acetaminophen]    Review of Systems   Review of Systems  Unable to perform ROS: Mental status  change  Constitutional:  Negative for fever.  Gastrointestinal:  Negative for abdominal pain, nausea and vomiting.  Neurological:  Negative for headaches.  Psychiatric/Behavioral:  Positive for memory loss.     Physical Exam Updated Vital Signs BP (!) 182/81 (BP Location: Right Arm)   Pulse 72   Temp (!) 97.4 F (36.3 C)   Resp 16   Ht 5\' 5"  (1.651 m)   SpO2 100%   BMI 19.08 kg/m  Physical Exam Vitals and nursing note reviewed.  Constitutional:      General: He is not in acute distress.    Appearance: Normal appearance. He is  well-developed.  HENT:     Head: Normocephalic and atraumatic.  Eyes:     Conjunctiva/sclera: Conjunctivae normal.  Cardiovascular:     Rate and Rhythm: Normal rate and regular rhythm.     Heart sounds: No murmur heard. Pulmonary:     Effort: Pulmonary effort is normal. No respiratory distress.     Breath sounds: Normal breath sounds.  Abdominal:     Palpations: Abdomen is soft.     Tenderness: There is no abdominal tenderness. There is no guarding or rebound.  Musculoskeletal:        General: No tenderness.     Cervical back: Neck supple.     Comments: He has a fistula right upper arm with positive thrill  Skin:    General: Skin is warm and dry.     Capillary Refill: Capillary refill takes less than 2 seconds.  Neurological:     General: No focal deficit present.     Mental Status: He is disoriented.     Sensory: No sensory deficit.     Motor: No weakness.     ED Results / Procedures / Treatments   Labs (all labs ordered are listed, but only abnormal results are displayed) Labs Reviewed  COMPREHENSIVE METABOLIC PANEL  CBC WITH DIFFERENTIAL/PLATELET  ETHANOL  AMMONIA  RAPID URINE DRUG SCREEN, HOSP PERFORMED  URINALYSIS, ROUTINE W REFLEX MICROSCOPIC    EKG None  Radiology No results found.  Procedures Procedures  {Document cardiac monitor, telemetry assessment procedure when appropriate:1}  Medications Ordered in ED Medications - No data to display  ED Course/ Medical Decision Making/ A&P   {   Click here for ABCD2, HEART and other calculatorsREFRESH Note before signing :1}                          Medical Decision Making Amount and/or Complexity of Data Reviewed Labs: ordered. Radiology: ordered.   This patient complains of ***; this involves an extensive number of treatment Options and is a complaint that carries with it a high risk of complications and morbidity. The differential includes ***  I ordered, reviewed and interpreted labs, which  included *** I ordered medication *** and reviewed PMP when indicated. I ordered imaging studies which included *** and I independently    visualized and interpreted imaging which showed *** Additional history obtained from *** Previous records obtained and reviewed *** I consulted *** and discussed lab and imaging findings and discussed disposition.  Cardiac monitoring reviewed, *** Social determinants considered, *** Critical Interventions: ***  After the interventions stated above, I reevaluated the patient and found *** Admission and further testing considered, ***   {Document critical care time when appropriate:1} {Document review of labs and clinical decision tools ie heart score, Chads2Vasc2 etc:1}  {Document your independent review of radiology images, and any outside  records:1} {Document your discussion with family members, caretakers, and with consultants:1} {Document social determinants of health affecting pt's care:1} {Document your decision making why or why not admission, treatments were needed:1} Final Clinical Impression(s) / ED Diagnoses Final diagnoses:  None    Rx / DC Orders ED Discharge Orders     None

## 2023-02-02 NOTE — ED Provider Notes (Signed)
Patient signed out to me at 1500 by Dr. Melina Copa pending reassessment for disposition.  In short this is a 67 year old male with a past medical history of ESRD on TTS HD, hypertension presenting to the emergency department with altered mental status after missed dialysis.  He did receive his full dialysis session today.  The patient reports that he does have these intermittent episodes of confusion when he does not go to dialysis consistently.  He states he only missed 1 session last Saturday because of the Easter holiday.  The patient's labs are at his baseline with no significant abnormalities.  He does not make any urine.  He has a negative head CT with no acute abnormalities.  On my evaluation, the patient is back to his neurologic baseline and states that he feels significantly better and feels comfortable with discharge home.  The patient is fully oriented and neurologically intact.  He is stable for discharge and was recommended nephro and PCP follow up and was given strict return precautions.   Maurice Durie, DO 02/02/23 1610

## 2023-02-02 NOTE — Discharge Instructions (Addendum)
You were seen in the emergency department for your weakness and confusion after missing dialysis.  Your labs here all look normal and your head CT scan showed no new abnormalities to explain your symptoms.  It could have been from missing your dialysis session on Saturday and corrected with your dialysis session today.  You should follow-up with your primary doctor and your nephrologist to have your symptoms rechecked.  You should return to the emergency department if you are having numbness or weakness on one side of the body compared to the other, worsening confusion, worsening weakness where you are unable to walk or if you have any other new or concerning symptoms.

## 2023-02-08 ENCOUNTER — Encounter: Payer: Self-pay | Admitting: Podiatry

## 2023-02-08 ENCOUNTER — Ambulatory Visit (INDEPENDENT_AMBULATORY_CARE_PROVIDER_SITE_OTHER): Payer: 59 | Admitting: Podiatry

## 2023-02-08 VITALS — BP 172/76 | HR 70

## 2023-02-08 DIAGNOSIS — N186 End stage renal disease: Secondary | ICD-10-CM

## 2023-02-08 DIAGNOSIS — M79674 Pain in right toe(s): Secondary | ICD-10-CM

## 2023-02-08 DIAGNOSIS — M79675 Pain in left toe(s): Secondary | ICD-10-CM

## 2023-02-08 DIAGNOSIS — B351 Tinea unguium: Secondary | ICD-10-CM | POA: Diagnosis not present

## 2023-02-08 NOTE — Progress Notes (Signed)
This patient returns to my office for at risk foot care.  This patient requires this care by a professional since this patient will be at risk due to having  ESRD.  This patient is unable to cut nails himself since the patient cannot reach his nails.These nails are painful walking and wearing shoes.  This patient presents for at risk foot care today.  General Appearance  Alert, conversant and in no acute stress.  Vascular  Dorsalis pedis and posterior tibial  pulses are palpable  bilaterally.  Capillary return is within normal limits  bilaterally. Temperature is within normal limits  bilaterally.  Neurologic  Senn-Weinstein monofilament wire test within normal limits  bilaterally. Muscle power within normal limits bilaterally.  Nails Thick disfigured discolored nails with subungual debris  from hallux to fifth toes bilaterally. No evidence of bacterial infection or drainage bilaterally.  Orthopedic  No limitations of motion  feet .  No crepitus or effusions noted.  No bony pathology or digital deformities noted.  Skin  normotropic skin with no porokeratosis noted bilaterally.  No signs of infections or ulcers noted.     Onychomycosis  Pain in right toes  Pain in left toes  Consent was obtained for treatment procedures.   Mechanical debridement of nails 1-5  bilaterally performed with a nail nipper.  Filed with dremel without incident.    Return office visit   3 months                   Told patient to return for periodic foot care and evaluation due to potential at risk complications.   Rihanna Marseille DPM   

## 2023-04-20 ENCOUNTER — Encounter: Payer: Self-pay | Admitting: Gastroenterology

## 2023-05-10 ENCOUNTER — Encounter: Payer: Self-pay | Admitting: Podiatry

## 2023-05-10 ENCOUNTER — Ambulatory Visit (INDEPENDENT_AMBULATORY_CARE_PROVIDER_SITE_OTHER): Payer: 59 | Admitting: Podiatry

## 2023-05-10 DIAGNOSIS — B351 Tinea unguium: Secondary | ICD-10-CM | POA: Diagnosis not present

## 2023-05-10 DIAGNOSIS — M79674 Pain in right toe(s): Secondary | ICD-10-CM

## 2023-05-10 DIAGNOSIS — M79675 Pain in left toe(s): Secondary | ICD-10-CM | POA: Diagnosis not present

## 2023-05-10 DIAGNOSIS — N186 End stage renal disease: Secondary | ICD-10-CM

## 2023-05-10 NOTE — Progress Notes (Signed)
This patient returns to my office for at risk foot care.  This patient requires this care by a professional since this patient will be at risk due to having  ESRD.  This patient is unable to cut nails himself since the patient cannot reach his nails.These nails are painful walking and wearing shoes.  This patient presents for at risk foot care today.  General Appearance  Alert, conversant and in no acute stress.  Vascular  Dorsalis pedis and posterior tibial  pulses are palpable  bilaterally.  Capillary return is within normal limits  bilaterally. Temperature is within normal limits  bilaterally.  Neurologic  Senn-Weinstein monofilament wire test within normal limits  bilaterally. Muscle power within normal limits bilaterally.  Nails Thick disfigured discolored nails with subungual debris  from hallux to fifth toes bilaterally. No evidence of bacterial infection or drainage bilaterally.  Orthopedic  No limitations of motion  feet .  No crepitus or effusions noted.  No bony pathology or digital deformities noted.  Skin  normotropic skin with no porokeratosis noted bilaterally.  No signs of infections or ulcers noted.     Onychomycosis  Pain in right toes  Pain in left toes  Consent was obtained for treatment procedures.   Mechanical debridement of nails 1-5  bilaterally performed with a nail nipper.  Filed with dremel without incident.    Return office visit   3 months                   Told patient to return for periodic foot care and evaluation due to potential at risk complications.   Frieda Arnall DPM   
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
## 2023-06-16 ENCOUNTER — Ambulatory Visit: Payer: 59 | Admitting: Gastroenterology

## 2023-06-23 ENCOUNTER — Encounter: Payer: Self-pay | Admitting: Nurse Practitioner

## 2023-06-23 ENCOUNTER — Other Ambulatory Visit (INDEPENDENT_AMBULATORY_CARE_PROVIDER_SITE_OTHER): Payer: 59

## 2023-06-23 ENCOUNTER — Ambulatory Visit (INDEPENDENT_AMBULATORY_CARE_PROVIDER_SITE_OTHER): Payer: 59 | Admitting: Nurse Practitioner

## 2023-06-23 VITALS — BP 150/80 | Ht 65.0 in | Wt 122.0 lb

## 2023-06-23 DIAGNOSIS — D638 Anemia in other chronic diseases classified elsewhere: Secondary | ICD-10-CM

## 2023-06-23 DIAGNOSIS — R630 Anorexia: Secondary | ICD-10-CM

## 2023-06-23 DIAGNOSIS — R634 Abnormal weight loss: Secondary | ICD-10-CM

## 2023-06-23 DIAGNOSIS — R110 Nausea: Secondary | ICD-10-CM

## 2023-06-23 DIAGNOSIS — N186 End stage renal disease: Secondary | ICD-10-CM

## 2023-06-23 DIAGNOSIS — K219 Gastro-esophageal reflux disease without esophagitis: Secondary | ICD-10-CM | POA: Diagnosis not present

## 2023-06-23 LAB — COMPREHENSIVE METABOLIC PANEL
ALT: 14 U/L (ref 0–53)
AST: 28 U/L (ref 0–37)
Albumin: 4.4 g/dL (ref 3.5–5.2)
Alkaline Phosphatase: 72 U/L (ref 39–117)
BUN: 39 mg/dL — ABNORMAL HIGH (ref 6–23)
CO2: 35 mEq/L — ABNORMAL HIGH (ref 19–32)
Calcium: 10 mg/dL (ref 8.4–10.5)
Chloride: 94 mEq/L — ABNORMAL LOW (ref 96–112)
Creatinine, Ser: 8.07 mg/dL (ref 0.40–1.50)
GFR: 6.48 mL/min — CL (ref >60.00)
Glucose, Bld: 81 mg/dL (ref 70–99)
Potassium: 5 mEq/L (ref 3.5–5.1)
Sodium: 139 mEq/L (ref 135–145)
Total Bilirubin: 0.5 mg/dL (ref 0.2–1.2)
Total Protein: 7.5 g/dL (ref 6.0–8.3)

## 2023-06-23 LAB — CBC WITH DIFFERENTIAL/PLATELET
Basophils Absolute: 0 10*3/uL (ref 0.0–0.1)
Basophils Relative: 0.9 % (ref 0.0–3.0)
Eosinophils Absolute: 0.2 10*3/uL (ref 0.0–0.7)
Eosinophils Relative: 6.5 % — ABNORMAL HIGH (ref 0.0–5.0)
HCT: 37.6 % — ABNORMAL LOW (ref 39.0–52.0)
Hemoglobin: 12 g/dL — ABNORMAL LOW (ref 13.0–17.0)
Lymphocytes Relative: 28.6 % (ref 12.0–46.0)
Lymphs Abs: 0.9 10*3/uL (ref 0.7–4.0)
MCHC: 31.9 g/dL (ref 30.0–36.0)
MCV: 101.3 fl — ABNORMAL HIGH (ref 78.0–100.0)
Monocytes Absolute: 0.4 10*3/uL (ref 0.1–1.0)
Monocytes Relative: 14.1 % — ABNORMAL HIGH (ref 3.0–12.0)
Neutro Abs: 1.5 10*3/uL (ref 1.4–7.7)
Neutrophils Relative %: 49.9 % (ref 43.0–77.0)
Platelets: 81 10*3/uL — ABNORMAL LOW (ref 150.0–400.0)
RBC: 3.71 Mil/uL — ABNORMAL LOW (ref 4.22–5.81)
RDW: 15.8 % — ABNORMAL HIGH (ref 11.5–15.5)
WBC: 3 10*3/uL — ABNORMAL LOW (ref 4.0–10.5)

## 2023-06-23 LAB — LIPASE: Lipase: 63 U/L — ABNORMAL HIGH (ref 11.0–59.0)

## 2023-06-23 MED ORDER — OMEPRAZOLE 20 MG PO CPDR: 20.0000 mg | DELAYED_RELEASE_CAPSULE | Freq: Every day | ORAL | 1 refills | Status: DC
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
## 2023-06-23 NOTE — Patient Instructions (Addendum)
You have been scheduled for an endoscopy. Please follow written instructions given to you at your visit today.  If you use inhalers (even only as needed), please bring them with you on the day of your procedure.  If you take any of the following medications, they will need to be adjusted prior to your procedure:   DO NOT TAKE 7 DAYS PRIOR TO TEST- Trulicity (dulaglutide) Ozempic, Wegovy (semaglutide) Mounjaro (tirzepatide) Bydureon Bcise (exanatide extended release)  DO NOT TAKE 1 DAY PRIOR TO YOUR TEST Rybelsus (semaglutide) Adlyxin (lixisenatide) Victoza (liraglutide) Byetta (exanatide) ___________________________________________________________________________ Your provider has requested that you go to the basement level for lab work before leaving today. Press "B" on the elevator. The lab is located at the first door on the left as you exit the elevator.  You will receive a call from [facility] Scheduling to schedule your ultrasound.  Due to recent changes in healthcare laws, you may see the results of your imaging and laboratory studies on MyChart before your provider has had a chance to review them.  We understand that in some cases there may be results that are confusing or concerning to you. Not all laboratory results come back in the same time frame and the provider may be waiting for multiple results in order to interpret others.  Please give [place] 48 hours in order for your provider to thoroughly review all the results before contacting the office for clarification of your results.   Thank you for trusting me with your gastrointestinal care!   Alcide Evener, CRNP

## 2023-06-25 LAB — HEPATITIS C RNA QUANTITATIVE
HCV Quantitative Log: <1.18 {Log_IU}/mL
HCV RNA, PCR, QN: <15 [IU]/mL

## 2023-06-28 ENCOUNTER — Ambulatory Visit (HOSPITAL_COMMUNITY)
Admission: RE | Admit: 2023-06-28 | Discharge: 2023-06-28 | Disposition: A | Discharge status: Home / Self Care | Payer: 59 | Source: Ambulatory Visit | Attending: Nurse Practitioner | Admitting: Nurse Practitioner

## 2023-06-28 DIAGNOSIS — R630 Anorexia: Secondary | ICD-10-CM | POA: Insufficient documentation

## 2023-06-28 DIAGNOSIS — R110 Nausea: Secondary | ICD-10-CM | POA: Insufficient documentation

## 2023-06-28 DIAGNOSIS — K219 Gastro-esophageal reflux disease without esophagitis: Secondary | ICD-10-CM | POA: Diagnosis present

## 2023-06-28 DIAGNOSIS — R634 Abnormal weight loss: Secondary | ICD-10-CM | POA: Insufficient documentation

## 2023-06-28 NOTE — Progress Notes (Signed)
Addendum: Reviewed and agree with assessment and management plan. Agree with EGD in the outpatient hospital setting Erva Koke, Carie Caddy, MD

## 2023-06-30 ENCOUNTER — Telehealth: Payer: Self-pay | Admitting: Nurse Practitioner

## 2023-06-30 NOTE — Telephone Encounter (Signed)
PT says he was returning a call from yesterday. Please advise.

## 2023-06-30 NOTE — Telephone Encounter (Signed)
Unable to reach pt. Unable to leave voice mail due to voice mail box being full.

## 2023-07-01 NOTE — Telephone Encounter (Signed)
Unable to reach patient, VM box full.

## 2023-07-02 NOTE — Telephone Encounter (Signed)
Third attempt to reach patient. Unable to leave message, VM box full. Letter mailed home.

## 2023-07-17 ENCOUNTER — Other Ambulatory Visit: Payer: Self-pay | Admitting: Nurse Practitioner

## 2023-07-28 NOTE — Telephone Encounter (Signed)
Pt contacted our office after receiving letter in the mail. Pt was notified that he has been scheduled for the EGD at [facility] on 08/16/2023 at 11:00 AM and needs to arrive at 9:30 AM  Pt was notified that prep instructions will be sent to him in the mail. Address confirmed with pt.  Pt verbalized understanding with all questions answered.

## 2023-08-10 ENCOUNTER — Encounter: Payer: Self-pay | Admitting: Podiatry

## 2023-08-10 ENCOUNTER — Encounter (HOSPITAL_COMMUNITY): Payer: Self-pay | Admitting: Internal Medicine

## 2023-08-10 ENCOUNTER — Ambulatory Visit (INDEPENDENT_AMBULATORY_CARE_PROVIDER_SITE_OTHER): Payer: 59 | Admitting: Podiatry

## 2023-08-10 DIAGNOSIS — M79675 Pain in left toe(s): Secondary | ICD-10-CM

## 2023-08-10 DIAGNOSIS — B351 Tinea unguium: Secondary | ICD-10-CM | POA: Diagnosis not present

## 2023-08-10 DIAGNOSIS — M79674 Pain in right toe(s): Secondary | ICD-10-CM

## 2023-08-10 DIAGNOSIS — N186 End stage renal disease: Secondary | ICD-10-CM

## 2023-08-10 NOTE — Progress Notes (Signed)
This patient returns to my office for at risk foot care.  This patient requires this care by a professional since this patient will be at risk due to having  ESRD.  This patient is unable to cut nails himself since the patient cannot reach his nails.These nails are painful walking and wearing shoes.  This patient presents for at risk foot care today.  General Appearance  Alert, conversant and in no acute stress.  Vascular  Dorsalis pedis and posterior tibial  pulses are palpable  bilaterally.  Capillary return is within normal limits  bilaterally. Temperature is within normal limits  bilaterally.  Neurologic  Senn-Weinstein monofilament wire test within normal limits  bilaterally. Muscle power within normal limits bilaterally.  Nails Thick disfigured discolored nails with subungual debris  from hallux to fifth toes bilaterally. No evidence of bacterial infection or drainage bilaterally.  Orthopedic  No limitations of motion  feet .  No crepitus or effusions noted.  No bony pathology or digital deformities noted.  Skin  normotropic skin with no porokeratosis noted bilaterally.  No signs of infections or ulcers noted.     Onychomycosis  Pain in right toes  Pain in left toes  Consent was obtained for treatment procedures.   Mechanical debridement of nails 1-5  bilaterally performed with a nail nipper.  Filed with dremel without incident.    Return office visit   3 months                   Told patient to return for periodic foot care and evaluation due to potential at risk complications.   Gardiner Barefoot DPM

## 2023-08-10 NOTE — Progress Notes (Signed)
Attempted to obtain medical history for pre op call via telephone, unable to reach at this time. HIPAA compliant voicemail message left requesting return call to pre surgical testing department.

## 2023-08-16 ENCOUNTER — Ambulatory Visit (HOSPITAL_COMMUNITY)
Admission: RE | Admit: 2023-08-16 | Discharge: 2023-08-16 | Disposition: A | Discharge status: Home / Self Care | Payer: 59 | Attending: Internal Medicine | Admitting: Internal Medicine

## 2023-08-16 ENCOUNTER — Other Ambulatory Visit: Payer: Self-pay

## 2023-08-16 ENCOUNTER — Encounter (HOSPITAL_COMMUNITY)
Admission: RE | Disposition: A | Discharge status: Home / Self Care | Payer: Self-pay | Source: Home / Self Care | Attending: Internal Medicine

## 2023-08-16 ENCOUNTER — Ambulatory Visit (HOSPITAL_BASED_OUTPATIENT_CLINIC_OR_DEPARTMENT_OTHER): Payer: 59 | Admitting: Anesthesiology

## 2023-08-16 ENCOUNTER — Ambulatory Visit (HOSPITAL_COMMUNITY): Payer: 59 | Admitting: Anesthesiology

## 2023-08-16 ENCOUNTER — Encounter (HOSPITAL_COMMUNITY): Payer: Self-pay | Admitting: Internal Medicine

## 2023-08-16 DIAGNOSIS — R634 Abnormal weight loss: Secondary | ICD-10-CM | POA: Insufficient documentation

## 2023-08-16 DIAGNOSIS — K2289 Other specified disease of esophagus: Secondary | ICD-10-CM

## 2023-08-16 DIAGNOSIS — R110 Nausea: Secondary | ICD-10-CM

## 2023-08-16 DIAGNOSIS — F32A Depression, unspecified: Secondary | ICD-10-CM | POA: Insufficient documentation

## 2023-08-16 DIAGNOSIS — F419 Anxiety disorder, unspecified: Secondary | ICD-10-CM | POA: Diagnosis not present

## 2023-08-16 DIAGNOSIS — R112 Nausea with vomiting, unspecified: Secondary | ICD-10-CM | POA: Diagnosis not present

## 2023-08-16 DIAGNOSIS — Z6820 Body mass index (BMI) 20.0-20.9, adult: Secondary | ICD-10-CM | POA: Insufficient documentation

## 2023-08-16 DIAGNOSIS — Z992 Dependence on renal dialysis: Secondary | ICD-10-CM | POA: Diagnosis not present

## 2023-08-16 DIAGNOSIS — K222 Esophageal obstruction: Secondary | ICD-10-CM | POA: Diagnosis not present

## 2023-08-16 DIAGNOSIS — F17210 Nicotine dependence, cigarettes, uncomplicated: Secondary | ICD-10-CM | POA: Insufficient documentation

## 2023-08-16 DIAGNOSIS — K3189 Other diseases of stomach and duodenum: Secondary | ICD-10-CM | POA: Diagnosis not present

## 2023-08-16 DIAGNOSIS — D631 Anemia in chronic kidney disease: Secondary | ICD-10-CM | POA: Insufficient documentation

## 2023-08-16 DIAGNOSIS — I120 Hypertensive chronic kidney disease with stage 5 chronic kidney disease or end stage renal disease: Secondary | ICD-10-CM | POA: Insufficient documentation

## 2023-08-16 DIAGNOSIS — K219 Gastro-esophageal reflux disease without esophagitis: Secondary | ICD-10-CM | POA: Diagnosis not present

## 2023-08-16 DIAGNOSIS — R630 Anorexia: Secondary | ICD-10-CM | POA: Diagnosis present

## 2023-08-16 DIAGNOSIS — N186 End stage renal disease: Secondary | ICD-10-CM | POA: Insufficient documentation

## 2023-08-16 DIAGNOSIS — Z8673 Personal history of transient ischemic attack (TIA), and cerebral infarction without residual deficits: Secondary | ICD-10-CM | POA: Insufficient documentation

## 2023-08-16 HISTORY — PX: BIOPSY: SHX5522

## 2023-08-16 HISTORY — PX: ESOPHAGOGASTRODUODENOSCOPY (EGD) WITH PROPOFOL: SHX5813

## 2023-08-16 LAB — POCT I-STAT, CHEM 8
BUN: 48 mg/dL — ABNORMAL HIGH (ref 8–23)
Calcium, Ion: 1.08 mmol/L — ABNORMAL LOW (ref 1.15–1.40)
Chloride: 101 mmol/L (ref 98–111)
Creatinine, Ser: 9.7 mg/dL — ABNORMAL HIGH (ref 0.61–1.24)
Glucose, Bld: 82 mg/dL (ref 70–99)
HCT: 32 % — ABNORMAL LOW (ref 39.0–52.0)
Hemoglobin: 10.9 g/dL — ABNORMAL LOW (ref 13.0–17.0)
Potassium: 5.8 mmol/L — ABNORMAL HIGH (ref 3.5–5.1)
Sodium: 136 mmol/L (ref 135–145)
TCO2: 28 mmol/L (ref 22–32)

## 2023-08-16 SURGERY — ESOPHAGOGASTRODUODENOSCOPY (EGD) WITH PROPOFOL
Anesthesia: Monitor Anesthesia Care

## 2023-08-16 MED ORDER — PROPOFOL 500 MG/50ML IV EMUL
INTRAVENOUS | Status: DC | PRN
  Administered 2023-08-16: 125 ug/kg/min via INTRAVENOUS

## 2023-08-16 MED ORDER — LIDOCAINE 2% (20 MG/ML) 5 ML SYRINGE
INTRAMUSCULAR | Status: DC | PRN
  Administered 2023-08-16: 40 mg via INTRAVENOUS

## 2023-08-16 MED ORDER — ONDANSETRON HCL 4 MG/2ML IJ SOLN
INTRAMUSCULAR | Status: DC | PRN
  Administered 2023-08-16: 4 mg via INTRAVENOUS

## 2023-08-16 MED ORDER — OMEPRAZOLE 40 MG PO CPDR: 40.0000 mg | DELAYED_RELEASE_CAPSULE | Freq: Every day | ORAL | 3 refills | Status: DC

## 2023-08-16 MED ORDER — SODIUM CHLORIDE 0.9 % IV SOLN: INTRAVENOUS | Status: DC

## 2023-08-16 MED ORDER — PROPOFOL 10 MG/ML IV BOLUS
INTRAVENOUS | Status: DC | PRN
  Administered 2023-08-16 (×2): 10 mg via INTRAVENOUS

## 2023-08-16 MED ORDER — PROPOFOL 1000 MG/100ML IV EMUL
INTRAVENOUS | Status: AC
  Filled 2023-08-16: qty 100

## 2023-08-16 SURGICAL SUPPLY — 15 items

## 2023-08-16 NOTE — Anesthesia Procedure Notes (Signed)
Procedure Name: MAC Date/Time: 08/16/2023 10:49 AM  Performed by: Orest Dikes, CRNAPre-anesthesia Checklist: Patient identified, Emergency Drugs available, Suction available and Patient being monitored Oxygen Delivery Method: Simple face mask
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
## 2023-08-16 NOTE — Discharge Instructions (Signed)

## 2023-08-16 NOTE — Transfer of Care (Signed)
Immediate Anesthesia Transfer of Care Note  Patient: Maurice Bell  Procedure(s) Performed: ESOPHAGOGASTRODUODENOSCOPY (EGD) WITH PROPOFOL BIOPSY  Patient Location: PACU and Endoscopy Unit  Anesthesia Type:MAC  Level of Consciousness: drowsy  Airway & Oxygen Therapy: Patient Spontanous Breathing and Patient connected to face mask oxygen  Post-op Assessment: Report given to RN and Post -op Vital signs reviewed and stable  Post vital signs: Reviewed and stable  Last Vitals:  Vitals Value Taken Time  BP 144/44 08/16/23 1110  Temp 36.3 C 08/16/23 1110  Pulse 58 08/16/23 1112  Resp 16 08/16/23 1112  SpO2 100 % 08/16/23 1112  Vitals shown include unfiled device data.  Last Pain:  Vitals:   08/16/23 1110  TempSrc: Temporal  PainSc: Asleep         Complications: No notable events documented.

## 2023-08-16 NOTE — Anesthesia Preprocedure Evaluation (Signed)
Anesthesia Evaluation  Patient identified by MRN, date of birth, ID band Patient awake    Reviewed: Allergy & Precautions, NPO status , Patient's Chart, lab work & pertinent test results  Airway Mallampati: III  TM Distance: >3 FB Neck ROM: Full    Dental  (+) Missing   Pulmonary Current Smoker and Patient abstained from smoking.   Pulmonary exam normal        Cardiovascular hypertension, Pt. on medications and Pt. on home beta blockers Normal cardiovascular exam     Neuro/Psych  PSYCHIATRIC DISORDERS Anxiety Depression    CVA    GI/Hepatic negative GI ROS,,,(+)     substance abuse  , Hepatitis -  Endo/Other  negative endocrine ROS    Renal/GU ESRF and DialysisRenal disease (On HD T, R, Sat)K: 4.4     Musculoskeletal  (+) Arthritis ,  narcotic dependent  Abdominal   Peds  Hematology  (+) Blood dyscrasia, anemia   Anesthesia Other Findings ESRD  Reproductive/Obstetrics                             Anesthesia Physical Anesthesia Plan  ASA: 3  Anesthesia Plan: MAC   Post-op Pain Management: Minimal or no pain anticipated   Induction: Intravenous  PONV Risk Score and Plan: 1 and Ondansetron, Dexamethasone and Treatment may vary due to age or medical condition  Airway Management Planned: Natural Airway, Simple Face Mask and Nasal Cannula  Additional Equipment: None  Intra-op Plan:   Post-operative Plan: Extubation in OR  Informed Consent: I have reviewed the patients History and Physical, chart, labs and discussed the procedure including the risks, benefits and alternatives for the proposed anesthesia with the patient or authorized representative who has indicated his/her understanding and acceptance.     Dental advisory given  Plan Discussed with: CRNA and Anesthesiologist  Anesthesia Plan Comments:        Anesthesia Quick Evaluation

## 2023-08-16 NOTE — Op Note (Signed)
[facility] [name] Procedure Date: 08/16/2023 MRN: [id number] Attending MD: Beverley Fiedler , MD, [id number] Date of Birth: [birth year] CSN: [id number] Age: 67 Admit Type: Outpatient Procedure:                Upper GI endoscopy Indications:              Anorexia, Nausea with vomiting, Weight loss, pt                            denies dysphagia Providers:                Carie Caddy. Rhea Belton, MD, Lorenza Evangelist, RN, Melany Guernsey, Technician Referring MD:             Shelbie Ammons Sherryll Burger Medicines:                Monitored Anesthesia Care Complications:            No immediate complications. Estimated Blood Loss:     Estimated blood loss was minimal. Procedure:                Pre-Anesthesia Assessment:                           - Prior to the procedure, a History and Physical                            was performed, and patient medications and                            allergies were reviewed. The patient's tolerance of                            previous anesthesia was also reviewed. The risks                            and benefits of the procedure and the sedation                            options and risks were discussed with the patient.                            All questions were answered, and informed consent                            was obtained. Prior Anticoagulants: The patient has                            taken no anticoagulant or antiplatelet agents. ASA                            Grade Assessment: III - A patient with severe  systemic disease. After reviewing the risks and                            benefits, the patient was deemed in satisfactory                            condition to undergo the procedure.                           After obtaining informed consent, the endoscope was                            passed under direct vision. Throughout the                             procedure, the patient's blood pressure, pulse, and                            oxygen saturations were monitored continuously. The                            [id number]-H190 ([id number]) Olympus endoscope was introduced                            through the mouth, and advanced to the second part                            of duodenum. The upper GI endoscopy was                            accomplished without difficulty. The patient                            tolerated the procedure well. Scope In: Scope Out: Findings:      Mild inflammation characterized by congestion (edema) and granularity       was found in the lower third of the esophagus.      One benign-appearing, intrinsic moderate (circumferential scarring or       stenosis; an endoscope may pass) stenosis was found at the       gastroesophageal junction. This stenosis measured 1.5 cm (inner       diameter) x less than one cm (in length). The stenosis was traversed.      The entire examined stomach was normal. Biopsies were taken with a cold       forceps for Helicobacter pylori testing.      Small venous blebs were found in the duodenal bulb.      The exam of the duodenum was otherwise normal. Impression:               - Esophageal mucosal changes were present,                            including congestion (edema) and granularity.  Findings are suggestive of reflux inflammation.                           - Benign-appearing esophageal stenosis.                           - Normal stomach. Biopsied.                           - Small duodenal venous blebs.                           - Otherwise normal examined duodenum.                           - No clear explanation for nausea and loss of                            weight. Moderate Sedation:      N/A Recommendation:           - Patient has a contact number available for                            emergencies. The signs and symptoms of potential                             delayed complications were discussed with the                            patient. Return to normal activities tomorrow.                            Written discharge instructions were provided to the                            patient.                           - Resume previous diet.                           - Continue present medications.                           - Increase omeprazole to 40 mg daily.                           - Await pathology results.                           - We are attempting to obtain results from                            patient's previous colonoscopy.                           - CT scan abd/pelvis with IV  contrast -- weight                            loss. Procedure Code(s):        --- Professional ---                           [phone], Esophagogastroduodenoscopy, flexible,                            transoral; with biopsy, single or multiple Diagnosis Code(s):        --- Professional ---                           K22.89, Other specified disease of esophagus                           K22.2, Esophageal obstruction                           R63.0, Anorexia                           R11.2, Nausea with vomiting, unspecified                           R63.4, Abnormal weight loss CPT copyright 2022 American Medical Association. All rights reserved. The codes documented in this report are preliminary and upon coder review may  be revised to meet current compliance requirements. Beverley Fiedler, MD 08/16/2023 11:20:43 AM This report has been signed electronically. Number of Addenda: 0

## 2023-08-16 NOTE — Anesthesia Postprocedure Evaluation (Signed)
Anesthesia Post Note  Patient: Maurice Bell  Procedure(s) Performed: ESOPHAGOGASTRODUODENOSCOPY (EGD) WITH PROPOFOL BIOPSY     Patient location during evaluation: PACU Anesthesia Type: MAC Level of consciousness: awake and alert Pain management: pain level controlled Vital Signs Assessment: post-procedure vital signs reviewed and stable Respiratory status: spontaneous breathing, nonlabored ventilation, respiratory function stable and patient connected to nasal cannula oxygen Cardiovascular status: stable and blood pressure returned to baseline Postop Assessment: no apparent nausea or vomiting Anesthetic complications: no   No notable events documented.  Last Vitals:  Vitals:   08/16/23 1130 08/16/23 1140  BP: (!) 141/48 (!) 156/48  Pulse: 61 61  Resp: 17 16  Temp:    SpO2: 97% 96%    Last Pain:  Vitals:   08/16/23 1140  TempSrc:   PainSc: 0-No pain                 Mahati Vajda

## 2023-08-16 NOTE — Progress Notes (Signed)
Dr Miguel Rota notified of potassium level of 5.8.  Okay to proceed with procedure.

## 2023-08-18 ENCOUNTER — Encounter (HOSPITAL_COMMUNITY): Payer: Self-pay | Admitting: Internal Medicine

## 2023-08-18 LAB — SURGICAL PATHOLOGY

## 2023-08-19 ENCOUNTER — Telehealth: Payer: Self-pay | Admitting: Nurse Practitioner

## 2023-08-19 NOTE — Telephone Encounter (Signed)
DD, pls follow up on obtaining patient's past colonoscopy records which were requested at the time of his office visit 06/23/2023 and a second request was sent when he was in [facility] for his EGD on 08/16/2023. Please let me know when received. Refer to office visit note 06/23/2023. THX

## 2023-08-26 ENCOUNTER — Encounter: Payer: Self-pay | Admitting: Internal Medicine

## 2023-08-27 ENCOUNTER — Other Ambulatory Visit: Payer: Self-pay

## 2023-08-27 ENCOUNTER — Telehealth: Payer: Self-pay

## 2023-08-27 DIAGNOSIS — R634 Abnormal weight loss: Secondary | ICD-10-CM

## 2023-08-27 NOTE — Addendum Note (Signed)
Addended by: Illene Bolus on: 08/27/2023 08:35 AM   Modules accepted: Orders

## 2023-08-27 NOTE — Telephone Encounter (Signed)
Called patient with no answer and voicemail box is full. °

## 2023-08-27 NOTE — Telephone Encounter (Signed)
Contacted [facility] & representative states that the patient would need to sign a medical record release form. I've attempted to contact pt & was unable to leave a voicemail.

## 2023-08-27 NOTE — Plan of Care (Signed)
 [facility] Tonsillectomy/Adenoidectomy, Postoperative PEDS care plan entered in error.

## 2023-08-27 NOTE — Telephone Encounter (Addendum)
Patient scheduled for CT abd/pelvis with contrast at [facility] hospital on 08/31/23 at 3:30 pm. Patient is to arrive at 1:15 for check-in to drink oral contrast at 1:30 pm. Patient is to be npo 4 hours prior to CT scan.Patient also, needs labs prior to CT scan. Called patient with no answer voicemail box is full.

## 2023-08-30 NOTE — Telephone Encounter (Addendum)
Called patient with no answer and mailbox is full, could not leave a message.

## 2023-08-30 NOTE — Telephone Encounter (Signed)
Called patient with no answer and voicemail box is full. CT scan scheduled for tomorrow has been cancelled due to no return call from the patient. Mailed letter to patient to have him call the office to reschedule CT scan. Patient does not have MyChart.   FYI Dr. Rhea Belton

## 2023-08-31 ENCOUNTER — Ambulatory Visit (HOSPITAL_COMMUNITY): Payer: 59

## 2023-11-11 ENCOUNTER — Ambulatory Visit (INDEPENDENT_AMBULATORY_CARE_PROVIDER_SITE_OTHER): Payer: Medicare HMO | Admitting: Podiatry

## 2023-11-11 ENCOUNTER — Encounter: Payer: Self-pay | Admitting: Podiatry

## 2023-11-11 DIAGNOSIS — M79674 Pain in right toe(s): Secondary | ICD-10-CM | POA: Diagnosis not present

## 2023-11-11 DIAGNOSIS — B351 Tinea unguium: Secondary | ICD-10-CM

## 2023-11-11 DIAGNOSIS — N186 End stage renal disease: Secondary | ICD-10-CM

## 2023-11-11 DIAGNOSIS — M79675 Pain in left toe(s): Secondary | ICD-10-CM | POA: Diagnosis not present

## 2023-11-11 NOTE — Progress Notes (Signed)
 This patient returns to my office for at risk foot care.  This patient requires this care by a professional since this patient will be at risk due to having  ESRD.  This patient is unable to cut nails himself since the patient cannot reach his nails.These nails are painful walking and wearing shoes.  This patient presents for at risk foot care today.  General Appearance  Alert, conversant and in no acute stress.  Vascular  Dorsalis pedis and posterior tibial  pulses are palpable  bilaterally.  Capillary return is within normal limits  bilaterally. Temperature is within normal limits  bilaterally.  Neurologic  Senn-Weinstein monofilament wire test within normal limits  bilaterally. Muscle power within normal limits bilaterally.  Nails Thick disfigured discolored nails with subungual debris  from hallux to fifth toes bilaterally. No evidence of bacterial infection or drainage bilaterally.  Orthopedic  No limitations of motion  feet .  No crepitus or effusions noted.  No bony pathology or digital deformities noted.  Skin  normotropic skin with no porokeratosis noted bilaterally.  No signs of infections or ulcers noted.     Onychomycosis  Pain in right toes  Pain in left toes  Consent was obtained for treatment procedures.   Mechanical debridement of nails 1-5  bilaterally performed with a nail nipper.  Filed with dremel without incident.    Return office visit   10 weeks                   Told patient to return for periodic foot care and evaluation due to potential at risk complications.   Helane Gunther DPM

## 2023-11-29 ENCOUNTER — Ambulatory Visit
Admission: RE | Admit: 2023-11-29 | Discharge: 2023-11-29 | Disposition: A | Discharge status: Home / Self Care | Payer: Medicare HMO | Source: Ambulatory Visit | Attending: Family Medicine | Admitting: Family Medicine

## 2023-11-29 ENCOUNTER — Encounter: Payer: Self-pay | Admitting: Family Medicine

## 2023-11-29 ENCOUNTER — Other Ambulatory Visit: Payer: Self-pay | Admitting: Family Medicine

## 2023-11-29 DIAGNOSIS — M25511 Pain in right shoulder: Secondary | ICD-10-CM

## 2023-11-29 DIAGNOSIS — M25512 Pain in left shoulder: Secondary | ICD-10-CM

## 2024-01-24 ENCOUNTER — Ambulatory Visit: Payer: Medicare HMO | Admitting: Podiatry

## 2024-01-25 ENCOUNTER — Ambulatory Visit: Payer: Medicare HMO | Admitting: Podiatry

## 2024-02-04 ENCOUNTER — Encounter: Payer: Self-pay | Admitting: Podiatry

## 2024-02-04 ENCOUNTER — Ambulatory Visit: Admitting: Podiatry

## 2024-02-04 DIAGNOSIS — B351 Tinea unguium: Secondary | ICD-10-CM

## 2024-02-04 DIAGNOSIS — M79674 Pain in right toe(s): Secondary | ICD-10-CM

## 2024-02-04 DIAGNOSIS — M79675 Pain in left toe(s): Secondary | ICD-10-CM | POA: Diagnosis not present

## 2024-02-04 DIAGNOSIS — N186 End stage renal disease: Secondary | ICD-10-CM | POA: Diagnosis not present

## 2024-02-04 NOTE — Progress Notes (Signed)
 This patient returns to my office for at risk foot care.  This patient requires this care by a professional since this patient will be at risk due to having  ESRD.  This patient is unable to cut nails himself since the patient cannot reach his nails.These nails are painful walking and wearing shoes.  This patient presents for at risk foot care today.  General Appearance  Alert, conversant and in no acute stress.  Vascular  Dorsalis pedis and posterior tibial  pulses are palpable  bilaterally.  Capillary return is within normal limits  bilaterally. Temperature is within normal limits  bilaterally.  Neurologic  Senn-Weinstein monofilament wire test within normal limits  bilaterally. Muscle power within normal limits bilaterally.  Nails Thick disfigured discolored nails with subungual debris  from hallux to fifth toes bilaterally. No evidence of bacterial infection or drainage bilaterally.  Orthopedic  No limitations of motion  feet .  No crepitus or effusions noted.  No bony pathology or digital deformities noted.  Skin  normotropic skin with no porokeratosis noted bilaterally.  No signs of infections or ulcers noted.     Onychomycosis  Pain in right toes  Pain in left toes  Consent was obtained for treatment procedures.   Mechanical debridement of nails 1-5  bilaterally performed with a nail nipper.  Filed with dremel without incident.    Return office visit   10 weeks                   Told patient to return for periodic foot care and evaluation due to potential at risk complications.   Helane Gunther DPM

## 2024-03-01 ENCOUNTER — Encounter (INDEPENDENT_AMBULATORY_CARE_PROVIDER_SITE_OTHER): Payer: Self-pay | Admitting: Otolaryngology

## 2024-03-08 ENCOUNTER — Encounter (HOSPITAL_COMMUNITY): Payer: Self-pay | Admitting: *Deleted

## 2024-03-08 ENCOUNTER — Other Ambulatory Visit: Payer: Self-pay

## 2024-03-08 ENCOUNTER — Observation Stay (HOSPITAL_COMMUNITY)
Admission: EM | Admit: 2024-03-08 | Discharge: 2024-03-09 | Disposition: A | Discharge status: Home / Self Care | Attending: Internal Medicine | Admitting: Internal Medicine

## 2024-03-08 ENCOUNTER — Emergency Department (HOSPITAL_COMMUNITY)

## 2024-03-08 DIAGNOSIS — F32A Depression, unspecified: Secondary | ICD-10-CM | POA: Insufficient documentation

## 2024-03-08 DIAGNOSIS — D631 Anemia in chronic kidney disease: Secondary | ICD-10-CM | POA: Diagnosis not present

## 2024-03-08 DIAGNOSIS — N186 End stage renal disease: Secondary | ICD-10-CM | POA: Diagnosis not present

## 2024-03-08 DIAGNOSIS — Z1152 Encounter for screening for COVID-19: Secondary | ICD-10-CM | POA: Diagnosis not present

## 2024-03-08 DIAGNOSIS — D696 Thrombocytopenia, unspecified: Secondary | ICD-10-CM | POA: Insufficient documentation

## 2024-03-08 DIAGNOSIS — E875 Hyperkalemia: Principal | ICD-10-CM

## 2024-03-08 DIAGNOSIS — F17210 Nicotine dependence, cigarettes, uncomplicated: Secondary | ICD-10-CM | POA: Diagnosis not present

## 2024-03-08 DIAGNOSIS — Z79899 Other long term (current) drug therapy: Secondary | ICD-10-CM | POA: Diagnosis not present

## 2024-03-08 DIAGNOSIS — R0602 Shortness of breath: Secondary | ICD-10-CM | POA: Diagnosis present

## 2024-03-08 DIAGNOSIS — Z992 Dependence on renal dialysis: Secondary | ICD-10-CM | POA: Insufficient documentation

## 2024-03-08 DIAGNOSIS — I120 Hypertensive chronic kidney disease with stage 5 chronic kidney disease or end stage renal disease: Secondary | ICD-10-CM | POA: Diagnosis not present

## 2024-03-08 DIAGNOSIS — J441 Chronic obstructive pulmonary disease with (acute) exacerbation: Principal | ICD-10-CM

## 2024-03-08 DIAGNOSIS — I10 Essential (primary) hypertension: Secondary | ICD-10-CM | POA: Diagnosis present

## 2024-03-08 DIAGNOSIS — M6281 Muscle weakness (generalized): Secondary | ICD-10-CM | POA: Insufficient documentation

## 2024-03-08 DIAGNOSIS — Z8673 Personal history of transient ischemic attack (TIA), and cerebral infarction without residual deficits: Secondary | ICD-10-CM | POA: Insufficient documentation

## 2024-03-08 DIAGNOSIS — G8929 Other chronic pain: Secondary | ICD-10-CM | POA: Insufficient documentation

## 2024-03-08 DIAGNOSIS — N189 Chronic kidney disease, unspecified: Secondary | ICD-10-CM

## 2024-03-08 DIAGNOSIS — Z862 Personal history of diseases of the blood and blood-forming organs and certain disorders involving the immune mechanism: Secondary | ICD-10-CM

## 2024-03-08 DIAGNOSIS — R059 Cough, unspecified: Secondary | ICD-10-CM | POA: Diagnosis present

## 2024-03-08 LAB — BASIC METABOLIC PANEL WITH GFR
Anion gap: 14 (ref 5–15)
Anion gap: 14 (ref 5–15)
BUN: 48 mg/dL — ABNORMAL HIGH (ref 8–23)
BUN: 49 mg/dL — ABNORMAL HIGH (ref 8–23)
CO2: 28 mmol/L (ref 22–32)
CO2: 29 mmol/L (ref 22–32)
Calcium: 9.5 mg/dL (ref 8.9–10.3)
Calcium: 10 mg/dL (ref 8.9–10.3)
Chloride: 99 mmol/L (ref 98–111)
Chloride: 98 mmol/L (ref 98–111)
Creatinine, Ser: 8.94 mg/dL — ABNORMAL HIGH (ref 0.61–1.24)
Creatinine, Ser: 8.91 mg/dL — ABNORMAL HIGH (ref 0.61–1.24)
GFR, Estimated: 6 mL/min — ABNORMAL LOW (ref >60)
GFR, Estimated: 6 mL/min — ABNORMAL LOW (ref >60)
Glucose, Bld: 80 mg/dL (ref 70–99)
Glucose, Bld: 106 mg/dL — ABNORMAL HIGH (ref 70–99)
Potassium: 6.6 mmol/L (ref 3.5–5.1)
Potassium: 5.1 mmol/L (ref 3.5–5.1)
Sodium: 141 mmol/L (ref 135–145)
Sodium: 141 mmol/L (ref 135–145)

## 2024-03-08 LAB — CBC
HCT: 35.7 % — ABNORMAL LOW (ref 39.0–52.0)
Hemoglobin: 11.1 g/dL — ABNORMAL LOW (ref 13.0–17.0)
MCH: 32.6 pg (ref 26.0–34.0)
MCHC: 31.1 g/dL (ref 30.0–36.0)
MCV: 105 fL — ABNORMAL HIGH (ref 80.0–100.0)
Platelets: 89 10*3/uL — ABNORMAL LOW (ref 150–400)
RBC: 3.4 MIL/uL — ABNORMAL LOW (ref 4.22–5.81)
RDW: 14.7 % (ref 11.5–15.5)
WBC: 4.5 10*3/uL (ref 4.0–10.5)
nRBC: 0 % (ref 0.0–0.2)

## 2024-03-08 LAB — RESP PANEL BY RT-PCR (RSV, FLU A&B, COVID)  RVPGX2
Influenza A by PCR: NEGATIVE
Influenza B by PCR: NEGATIVE
Resp Syncytial Virus by PCR: NEGATIVE
SARS Coronavirus 2 by RT PCR: NEGATIVE

## 2024-03-08 LAB — TROPONIN I (HIGH SENSITIVITY)
Troponin I (High Sensitivity): 39 ng/L — ABNORMAL HIGH (ref <18)
Troponin I (High Sensitivity): 34 ng/L — ABNORMAL HIGH

## 2024-03-08 LAB — CBG MONITORING, ED
Glucose-Capillary: 74 mg/dL (ref 70–99)
Glucose-Capillary: 86 mg/dL (ref 70–99)

## 2024-03-08 MED ORDER — IPRATROPIUM-ALBUTEROL 0.5-2.5 (3) MG/3ML IN SOLN
3.0000 mL | Freq: Once | RESPIRATORY_TRACT | Status: AC
  Administered 2024-03-08: 3 mL via RESPIRATORY_TRACT
  Filled 2024-03-08: qty 3

## 2024-03-08 MED ORDER — ONDANSETRON HCL 4 MG/2ML IJ SOLN: 4.0000 mg | Freq: Four times a day (QID) | INTRAMUSCULAR | Status: DC | PRN

## 2024-03-08 MED ORDER — HYDRALAZINE HCL 50 MG PO TABS
100.0000 mg | ORAL_TABLET | Freq: Two times a day (BID) | ORAL | Status: DC
  Administered 2024-03-08 – 2024-03-09 (×2): 100 mg via ORAL
  Filled 2024-03-08 (×2): qty 2

## 2024-03-08 MED ORDER — CALCIUM GLUCONATE 10 % IV SOLN
1.0000 g | Freq: Once | INTRAVENOUS | Status: AC
  Administered 2024-03-08: 1 g via INTRAVENOUS

## 2024-03-08 MED ORDER — SENNA 8.6 MG PO TABS: 1.0000 | ORAL_TABLET | Freq: Every day | ORAL | Status: DC | PRN

## 2024-03-08 MED ORDER — SEVELAMER CARBONATE 800 MG PO TABS
3200.0000 mg | ORAL_TABLET | Freq: Three times a day (TID) | ORAL | Status: DC
  Administered 2024-03-09 (×2): 3200 mg via ORAL
  Filled 2024-03-08 (×2): qty 4

## 2024-03-08 MED ORDER — SODIUM BICARBONATE 8.4 % IV SOLN
50.0000 meq | Freq: Once | INTRAVENOUS | Status: AC
  Administered 2024-03-08: 50 meq via INTRAVENOUS
  Filled 2024-03-08: qty 50

## 2024-03-08 MED ORDER — SODIUM CHLORIDE 0.9% FLUSH
3.0000 mL | Freq: Two times a day (BID) | INTRAVENOUS | Status: DC
  Administered 2024-03-08 – 2024-03-09 (×2): 3 mL via INTRAVENOUS

## 2024-03-08 MED ORDER — BISACODYL 5 MG PO TBEC: 5.0000 mg | DELAYED_RELEASE_TABLET | Freq: Every day | ORAL | Status: DC | PRN

## 2024-03-08 MED ORDER — INSULIN ASPART 100 UNIT/ML IV SOLN
5.0000 [IU] | Freq: Once | INTRAVENOUS | Status: AC
  Administered 2024-03-08: 5 [IU] via INTRAVENOUS

## 2024-03-08 MED ORDER — METHYLPREDNISOLONE SODIUM SUCC 125 MG IJ SOLR
125.0000 mg | Freq: Once | INTRAMUSCULAR | Status: AC
  Administered 2024-03-08: 125 mg via INTRAVENOUS
  Filled 2024-03-08: qty 2

## 2024-03-08 MED ORDER — ALBUTEROL SULFATE (2.5 MG/3ML) 0.083% IN NEBU
10.0000 mg | INHALATION_SOLUTION | Freq: Once | RESPIRATORY_TRACT | Status: AC
  Administered 2024-03-08: 10 mg via RESPIRATORY_TRACT
  Filled 2024-03-08: qty 12

## 2024-03-08 MED ORDER — ACETAMINOPHEN 650 MG RE SUPP: 650.0000 mg | Freq: Four times a day (QID) | RECTAL | Status: DC | PRN

## 2024-03-08 MED ORDER — GUAIFENESIN ER 600 MG PO TB12
600.0000 mg | ORAL_TABLET | Freq: Two times a day (BID) | ORAL | Status: DC
  Administered 2024-03-08 – 2024-03-09 (×2): 600 mg via ORAL
  Filled 2024-03-08 (×2): qty 1

## 2024-03-08 MED ORDER — HEPARIN SODIUM (PORCINE) 5000 UNIT/ML IJ SOLN
5000.0000 [IU] | Freq: Three times a day (TID) | INTRAMUSCULAR | Status: DC
  Filled 2024-03-08: qty 1

## 2024-03-08 MED ORDER — AMITRIPTYLINE HCL 10 MG PO TABS
10.0000 mg | ORAL_TABLET | Freq: Every day | ORAL | Status: DC
  Administered 2024-03-08: 10 mg via ORAL
  Filled 2024-03-08 (×2): qty 1

## 2024-03-08 MED ORDER — OXYCODONE-ACETAMINOPHEN 5-325 MG PO TABS
1.0000 | ORAL_TABLET | Freq: Three times a day (TID) | ORAL | Status: DC
  Administered 2024-03-08 – 2024-03-09 (×2): 1 via ORAL
  Filled 2024-03-08 (×2): qty 1

## 2024-03-08 MED ORDER — DEXTROSE 50 % IV SOLN
1.0000 | Freq: Once | INTRAVENOUS | Status: AC
  Administered 2024-03-08: 50 mL via INTRAVENOUS
  Filled 2024-03-08: qty 50

## 2024-03-08 MED ORDER — METOPROLOL TARTRATE 50 MG PO TABS
150.0000 mg | ORAL_TABLET | Freq: Two times a day (BID) | ORAL | Status: DC
  Administered 2024-03-08 – 2024-03-09 (×2): 150 mg via ORAL
  Filled 2024-03-08: qty 1
  Filled 2024-03-08: qty 6

## 2024-03-08 MED ORDER — AMLODIPINE BESYLATE 10 MG PO TABS
10.0000 mg | ORAL_TABLET | Freq: Every day | ORAL | Status: DC
  Administered 2024-03-09: 10 mg via ORAL
  Filled 2024-03-08: qty 1

## 2024-03-08 MED ORDER — ACETAMINOPHEN 325 MG PO TABS: 650.0000 mg | ORAL_TABLET | Freq: Four times a day (QID) | ORAL | Status: DC | PRN

## 2024-03-08 MED ORDER — BUSPIRONE HCL 5 MG PO TABS
7.5000 mg | ORAL_TABLET | Freq: Two times a day (BID) | ORAL | Status: DC | PRN
  Administered 2024-03-09: 7.5 mg via ORAL
  Filled 2024-03-08: qty 2

## 2024-03-08 MED ORDER — MIRTAZAPINE 15 MG PO TABS
7.5000 mg | ORAL_TABLET | Freq: Every day | ORAL | Status: DC
  Administered 2024-03-08: 7.5 mg via ORAL
  Filled 2024-03-08: qty 1

## 2024-03-08 MED ORDER — ONDANSETRON HCL 4 MG PO TABS: 4.0000 mg | ORAL_TABLET | Freq: Four times a day (QID) | ORAL | Status: DC | PRN

## 2024-03-08 MED ORDER — SODIUM ZIRCONIUM CYCLOSILICATE 10 G PO PACK
10.0000 g | PACK | Freq: Once | ORAL | Status: AC
  Administered 2024-03-08: 10 g via ORAL
  Filled 2024-03-08: qty 1

## 2024-03-08 MED ORDER — NICOTINE 14 MG/24HR TD PT24
14.0000 mg | MEDICATED_PATCH | Freq: Every day | TRANSDERMAL | Status: DC
Start: 2024-03-08 — End: 2024-03-10
  Administered 2024-03-08 – 2024-03-09 (×2): 14 mg via TRANSDERMAL
  Filled 2024-03-08 (×2): qty 1

## 2024-03-08 MED ORDER — IPRATROPIUM-ALBUTEROL 0.5-2.5 (3) MG/3ML IN SOLN: 3.0000 mL | Freq: Four times a day (QID) | RESPIRATORY_TRACT | Status: DC | PRN

## 2024-03-08 NOTE — ED Notes (Signed)
 RT called for breathing treatment (SEE MAR)

## 2024-03-08 NOTE — Hospital Course (Signed)
 Maurice Bell is a 68 y.o. male with medical history significant for ESRD on TTS HD, Hx of CVA, HTN, anemia of CKD, depression/anxiety, chronic pain syndrome, and tobacco use who is admitted with hyperkalemia.
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
## 2024-03-08 NOTE — ED Triage Notes (Signed)
 The pt is a dialysis pt that was dialyzed yesterday  today at noon he started having some sob and some chest pressure  fistula rt  arm

## 2024-03-08 NOTE — ED Provider Notes (Addendum)
 [place] EMERGENCY DEPARTMENT AT [facility] Provider Note   CSN: [id number] Arrival date & time: 03/08/24  1608     History  Chief Complaint  Patient presents with   Shortness of Breath    Maurice Bell is a 68 y.o. male.  68 year old male with past medical history of hypertension, end-stage renal disease, and tobacco abuse presenting to the emergency department today with cough and shortness of breath.  The patient states that this been going on since yesterday.  He states that he did go to dialysis yesterday and had a normal session.  He states that he has been having some leg swelling recently.  He reports that he did start smoking again a few months ago and does smoke a pack of cigarettes every 3 days.  He denies any fevers with this.  Denies any hemoptysis.  Denies any sputum production.  The patient denies any pleuritic pain.  He came to the ER today for further evaluation regarding this due to ongoing symptoms.   Shortness of Breath Associated symptoms: cough        Home Medications Prior to Admission medications   Medication Sig Start Date End Date Taking? Authorizing Provider  acetaminophen  (TYLENOL ) 650 MG CR tablet Take 1,300 mg by mouth daily as needed for pain.   Yes [provider]  amitriptyline (ELAVIL) 10 MG tablet Take 10 mg by mouth at bedtime.   Yes [provider]  amLODipine  (NORVASC ) 10 MG tablet Take 10 mg by mouth daily.   Yes [provider]  B Complex-C-Folic Acid  (NEPHRO-VITE PO) Take 0.8 mg by mouth.   Yes [provider]  buprenorphine (BUTRANS) 15 MCG/HR Place 1 patch onto the skin once a week. 07/29/22  Yes [provider]  busPIRone (BUSPAR) 7.5 MG tablet Take 7.5 mg by mouth 2 (two) times daily as needed (for anxiety). 02/02/24  Yes [provider]  hydrALAZINE  (APRESOLINE ) 100 MG tablet Take 100 mg by mouth 2 (two) times daily. 07/09/21  Yes [provider]  ibuprofen   (ADVIL ) 200 MG tablet Take 400 mg by mouth daily as needed (pain).   Yes [provider]  iron sucrose  (VENOFER ) 20 MG/ML injection Inject 200 mg into the vein once a week. 03/04/21  Yes [provider]  metoprolol  tartrate (LOPRESSOR ) 50 MG tablet Take 150 mg by mouth 2 (two) times daily.   Yes [provider]  mirtazapine (REMERON) 7.5 MG tablet Take 7.5 mg by mouth at bedtime. 08/20/21  Yes [provider]  multivitamin (RENA-VIT) TABS tablet Take 1 tablet by mouth at bedtime. Patient taking differently: Take 1 tablet by mouth daily. 09/11/18  Yes [name], [name], MD  omeprazole  (PRILOSEC) 40 MG capsule Take 1 capsule (40 mg total) by mouth daily. Patient taking differently: Take 40 mg by mouth daily as needed (for heartburn). 08/16/23  Yes Pyrtle, Amber Bail, MD  oxyCODONE -acetaminophen  (PERCOCET/ROXICET) 5-325 MG tablet Take 1 tablet by mouth in the morning, at noon, and at bedtime.   Yes [provider]  senna (SENOKOT) 8.6 MG TABS tablet Take 1 tablet (8.6 mg total) by mouth daily as needed for mild constipation. 10/25/20  Yes Ardelia Kohut, MD  sevelamer  carbonate (RENVELA ) 800 MG tablet Take 4 tablets (3,200 mg total) by mouth 3 (three) times daily with meals. 09/11/18  Yes [name], Hurman Maiden, MD      Allergies    Vicodin [hydrocodone -acetaminophen ]    Review of Systems   Review of Systems  Respiratory:  Positive for cough and shortness of breath.   All other systems reviewed and are negative.   Physical Exam Updated Vital Signs BP (!) 185/76   Pulse 96   Temp 98.8 F (37.1 C) (Oral)   Resp 20   Ht 5\' 5"  (1.651 m)   Wt 56.7 kg   SpO2 98%   BMI 20.80 kg/m  Physical Exam Vitals and nursing note reviewed.   Gen: NAD, mild conversational dyspnea noted Eyes: PERRL, EOMI HEENT: no oropharyngeal swelling Neck: trachea midline Resp: Fuhs wheezes throughout all lung fields Card: RRR, no murmurs, rubs, or gallops Abd: nontender,  nondistended Extremities: no calf tenderness, 1+ pitting edema bilaterally Vascular: 2+ radial pulses bilaterally, 2+ DP pulses bilaterally Skin: no rashes Psyc: acting appropriately   ED Results / Procedures / Treatments   Labs (all labs ordered are listed, but only abnormal results are displayed) Labs Reviewed  BASIC METABOLIC PANEL WITH GFR - Abnormal; Notable for the following components:      Result Value   Potassium 6.6 (*)    BUN 48 (*)    Creatinine, Ser 8.94 (*)    GFR, Estimated 6 (*)    All other components within normal limits  CBC - Abnormal; Notable for the following components:   RBC 3.40 (*)    Hemoglobin 11.1 (*)    HCT 35.7 (*)    MCV 105.0 (*)    Platelets 89 (*)    All other components within normal limits  TROPONIN I (HIGH SENSITIVITY) - Abnormal; Notable for the following components:   Troponin I (High Sensitivity) 39 (*)    All other components within normal limits  CBG MONITORING, ED  TROPONIN I (HIGH SENSITIVITY)    EKG EKG Interpretation Date/Time:  Wednesday Mar 08 2024 17:21:05 EDT Ventricular Rate:  69 PR Interval:  207 QRS Duration:  152 QT Interval:  456 QTC Calculation: 489 R Axis:   266  Text Interpretation: Sinus rhythm RBBB and LAFB Abnormal T, probable ischemia, lateral leads Confirmed by Abner Hoffman [phone]) on 03/08/2024 7:07:40 PM  Radiology DG Chest 2 View Result Date: 03/08/2024 CLINICAL DATA:  Shortness of breath post dialysis EXAM: CHEST - 2 VIEW COMPARISON:  February 02, 2023 FINDINGS: The heart size and mediastinal contours are within normal limits. Both lungs are clear. The visualized skeletal structures are unremarkable. IMPRESSION: No active cardiopulmonary disease. Electronically Signed   By: Fredrich Jefferson M.D.   On: 03/08/2024 17:15    Procedures Procedures    Medications Ordered in ED Medications  albuterol  (PROVENTIL ) (2.5 MG/3ML) 0.083% nebulizer solution 10 mg (has no administration in time range)   methylPREDNISolone  sodium succinate (SOLU-MEDROL ) 125 mg/2 mL injection 125 mg (has no administration in time range)  ipratropium-albuterol  (DUONEB) 0.5-2.5 (3) MG/3ML nebulizer solution 3 mL (3 mLs Nebulization Given 03/08/24 1751)  ipratropium-albuterol  (DUONEB) 0.5-2.5 (3) MG/3ML nebulizer solution 3 mL (3 mLs Nebulization Given 03/08/24 1751)  ipratropium-albuterol  (DUONEB) 0.5-2.5 (3) MG/3ML nebulizer solution 3 mL (3 mLs Nebulization Given 03/08/24 1751)  sodium zirconium cyclosilicate  (LOKELMA ) packet 10 g (10 g Oral Given 03/08/24 1940)  calcium  gluconate inj 10% (1 g) URGENT USE ONLY! (1 g Intravenous Given 03/08/24 1930)  insulin  aspart (novoLOG ) injection 5 Units (5 Units Intravenous Given 03/08/24 1937)    And  dextrose  50 % solution 50 mL (50 mLs Intravenous Given 03/08/24 1937)  sodium bicarbonate  injection 50 mEq (50 mEq Intravenous Given 03/08/24 1932)    ED Course/ Medical Decision Making/ A&P  Medical Decision Making 68 year old male with past medical history of end-stage renal disease, hypertension, and tobacco abuse presenting to the emergency department today with cough and shortness of breath.  The patient does have a lot of wheezing here on exam.  I will further evaluate him here with basic labs well as an EKG, chest x-ray, and troponin for further evaluation for ACS, pulmonary edema, pulmonary infiltrates, or pneumothorax.  Given the wheezing I will give the patient DuoNebs here.  This may be due to cardiac wheezes as well.  He does report smoking but denies a known history of COPD but given his smoking history very well may have this undiagnosed.  I will reevaluate for ultimate disposition.  The patient's chest x-ray is clear.  He has improved but is still mildly tachypneic on reevaluation.  Potassium did come back at 6.6.  He does have peaked T waves.  The patient is treated with calcium , bicarbonate, insulin , dextrose , and additional albuterol .  He was  also given Lokelma .  I did call and discussed case with Dr. Higinio Love.  She recommends admission and will dialyze patient in the morning.  Recommends an additional dose of Lokelma  overnight.  This was discussed with hospitalist service and he will be admitted for further evaluation.  CRITICAL CARE Performed by: Carin Charleston   Total critical care time: 40 minutes  Critical care time was exclusive of separately billable procedures and treating other patients.  Critical care was necessary to treat or prevent imminent or life-threatening deterioration.  Critical care was time spent personally by me on the following activities: development of treatment plan with patient and/or surrogate as well as nursing, discussions with consultants, evaluation of patient's response to treatment, examination of patient, obtaining history from patient or surrogate, ordering and performing treatments and interventions, ordering and review of laboratory studies, ordering and review of radiographic studies, pulse oximetry and re-evaluation of patient's condition.   Risk OTC drugs. Prescription drug management. Decision regarding hospitalization.           Final Clinical Impression(s) / ED Diagnoses Final diagnoses:  COPD exacerbation (HCC)  Hyperkalemia    Rx / DC Orders ED Discharge Orders     None         Carin Charleston, MD 03/08/24 2023    Carin Charleston, MD 03/08/24 2023

## 2024-03-08 NOTE — ED Notes (Signed)
 Patient given crackers and coca-cola soda (no potassium in can)

## 2024-03-09 ENCOUNTER — Other Ambulatory Visit (HOSPITAL_COMMUNITY): Payer: Self-pay

## 2024-03-09 ENCOUNTER — Encounter (INDEPENDENT_AMBULATORY_CARE_PROVIDER_SITE_OTHER): Payer: Self-pay | Admitting: Otolaryngology

## 2024-03-09 DIAGNOSIS — E875 Hyperkalemia: Secondary | ICD-10-CM | POA: Diagnosis not present

## 2024-03-09 LAB — CBC
HCT: 34 % — ABNORMAL LOW (ref 39.0–52.0)
Hemoglobin: 10.9 g/dL — ABNORMAL LOW (ref 13.0–17.0)
MCH: 32.9 pg (ref 26.0–34.0)
MCHC: 32.1 g/dL (ref 30.0–36.0)
MCV: 102.7 fL — ABNORMAL HIGH (ref 80.0–100.0)
Platelets: 81 10*3/uL — ABNORMAL LOW (ref 150–400)
RBC: 3.31 MIL/uL — ABNORMAL LOW (ref 4.22–5.81)
RDW: 14.6 % (ref 11.5–15.5)
WBC: 3.2 10*3/uL — ABNORMAL LOW (ref 4.0–10.5)
nRBC: 0 % (ref 0.0–0.2)

## 2024-03-09 LAB — BASIC METABOLIC PANEL WITH GFR
Anion gap: 17 — ABNORMAL HIGH (ref 5–15)
BUN: 53 mg/dL — ABNORMAL HIGH (ref 8–23)
CO2: 28 mmol/L (ref 22–32)
Calcium: 9.4 mg/dL (ref 8.9–10.3)
Chloride: 96 mmol/L — ABNORMAL LOW (ref 98–111)
Creatinine, Ser: 9.82 mg/dL — ABNORMAL HIGH (ref 0.61–1.24)
GFR, Estimated: 5 mL/min — ABNORMAL LOW (ref >60)
Glucose, Bld: 192 mg/dL — ABNORMAL HIGH (ref 70–99)
Potassium: 5.2 mmol/L — ABNORMAL HIGH (ref 3.5–5.1)
Sodium: 141 mmol/L (ref 135–145)

## 2024-03-09 LAB — HEPATITIS B SURFACE ANTIGEN: Hepatitis B Surface Ag: NONREACTIVE

## 2024-03-09 LAB — HIV ANTIBODY (ROUTINE TESTING W REFLEX): HIV Screen 4th Generation wRfx: NONREACTIVE

## 2024-03-09 LAB — MRSA NEXT GEN BY PCR, NASAL: MRSA by PCR Next Gen: NOT DETECTED

## 2024-03-09 MED ORDER — DOXERCALCIFEROL 4 MCG/2ML IV SOLN
4.0000 ug | INTRAVENOUS | Status: DC
  Administered 2024-03-09: 4 ug via INTRAVENOUS
  Filled 2024-03-09: qty 2

## 2024-03-09 MED ORDER — ALTEPLASE 2 MG IJ SOLR: 2.0000 mg | Freq: Once | INTRAMUSCULAR | Status: DC | PRN

## 2024-03-09 MED ORDER — NICOTINE 14 MG/24HR TD PT24
14.0000 mg | MEDICATED_PATCH | Freq: Every day | TRANSDERMAL | 0 refills | Status: DC
  Filled 2024-03-09: qty 28, 28d supply, fill #0

## 2024-03-09 MED ORDER — HEPARIN SODIUM (PORCINE) 1000 UNIT/ML DIALYSIS: 1000.0000 [IU] | INTRAMUSCULAR | Status: DC | PRN

## 2024-03-09 MED ORDER — CHLORHEXIDINE GLUCONATE CLOTH 2 % EX PADS
6.0000 | MEDICATED_PAD | Freq: Every day | CUTANEOUS | Status: DC
  Administered 2024-03-09: 6 via TOPICAL

## 2024-03-09 MED ORDER — PENTAFLUOROPROP-TETRAFLUOROETH EX AERO: 1.0000 | INHALATION_SPRAY | CUTANEOUS | Status: DC | PRN

## 2024-03-09 MED ORDER — LIDOCAINE HCL (PF) 1 % IJ SOLN: 5.0000 mL | INTRAMUSCULAR | Status: DC | PRN

## 2024-03-09 MED ORDER — LIDOCAINE-PRILOCAINE 2.5-2.5 % EX CREA: 1.0000 | TOPICAL_CREAM | CUTANEOUS | Status: DC | PRN

## 2024-03-09 MED ORDER — HYDRALAZINE HCL 20 MG/ML IJ SOLN
10.0000 mg | Freq: Four times a day (QID) | INTRAMUSCULAR | Status: DC | PRN
  Administered 2024-03-09: 10 mg via INTRAVENOUS
  Filled 2024-03-09: qty 1

## 2024-03-09 MED ORDER — ANTICOAGULANT SODIUM CITRATE 4% (200MG/5ML) IV SOLN: 5.0000 mL | Status: DC | PRN

## 2024-03-09 MED ORDER — DOXERCALCIFEROL 4 MCG/2ML IV SOLN
INTRAVENOUS | Status: AC
  Filled 2024-03-09: qty 2

## 2024-03-09 NOTE — Care Management Obs Status (Signed)
 MEDICARE OBSERVATION STATUS NOTIFICATION   Patient Details  Name: Maurice Bell MRN: [id number] Date of Birth: 03/13/1956   Medicare Observation Status Notification Given:  Yes  [place] letter igned and copy given  Wynonia Hedges 03/09/2024, 1:35 PM

## 2024-03-09 NOTE — Progress Notes (Signed)
 Pt receives out-pt HD at [facility] on TTS 5:00 am chair time. Provider's note and orders indicate pt will require inpt HD prior to d/c. Clinic advised pt should resume care on Saturday.   Lauraine Polite Renal Navigator [phone]

## 2024-03-09 NOTE — Discharge Summary (Signed)
 Maurice Bell ZOX:[id number] DOB: 1956/10/10 DOA: 03/08/2024  PCP: Maurice Gander, MD  Admit date: 03/08/2024  Discharge date: 03/09/2024  Admitted From: Home   Disposition:  Home   Recommendations for Outpatient Follow-up:   Follow up with PCP in 1-2 weeks  PCP Please obtain BMP/CBC, 2 view CXR in 1week,  (see Discharge instructions)   PCP Please follow up on the following pending results:    Home Health: None   Equipment/Devices: None  Consultations: Nephrology Discharge Condition: Stable    CODE STATUS: Full    Diet Recommendation: Renal diet with 1.2 L fluid restriction per day    Chief Complaint  Patient presents with   Shortness of Breath     Brief history of present illness from the day of admission and additional interim summary     68 y.o. male with medical history significant for ESRD on TTS HD, Hx of CVA, HTN, anemia of CKD, depression/anxiety, chronic pain syndrome, and tobacco use who presented to the ED for evaluation of shortness of breath.   Patient states that he went to his usual dialysis session yesterday without issue.  Earlier today he developed new onset shortness of breath.  He says he felt like his lungs were rattling.  He had developed significant orthopnea, also has started to smoke again in the last 2 years, the ER his workup was consistent with fluid overload, hyperkalemia, nephrology was consulted and he was admitted for extra run of HD.                                                                 Hospital Course   Hyperkalemia ESRD on TTS HD: Potassium 6.6 on admission, no hemolysis noted, with EKG changes.  This is despite going to usual dialysis day prior to admission. - Multiple temporizing measures given while in the ED - Potassium has come down, discussed with  nephrology, HD today thereafter if stable will be discharged home.   Shortness of breath: He had orthopnea, on exam does have crackles, symptoms consistent of fluid overload, he was given supportive care overnight already much improved, will have HD run thereafter if he is feeling better will be discharged home strictly counseled to abstain from smoking.   Hypertension: Resume home amlodipine , hydralazine , Lopressor .   Anemia of chronic disease Thrombocytopenia: Hemoglobin stable at 11.1.  Platelets decreased to 89.  No obvious bleeding.  Continue to monitor.   Depression/anxiety: Continue Elavil, BuSpar, Remeron.   Chronic pain: Continue home Percocet 5-325 mg 3 times daily.   Tobacco use: Patient reports smoking 1/3 PPD.  Nicotine  patch provided, counseled    Discharge diagnosis     Principal Problem:   Hyperkalemia Active Problems:   ESRD (end stage renal disease) on dialysis (HCC)   Shortness of breath  Hypertension   History of anemia due to CKD   Depression   Chronic pain   Thrombocytopenia [facility])    Discharge instructions    Discharge Instructions     Discharge instructions   Complete by: As directed    Follow with Primary MD Maurice Gander, MD in 7 days   Get CBC, CMP, 2 view Chest X ray -  checked next visit with your primary MD    Activity: As tolerated with Full fall precautions use walker/cane & assistance as needed  Disposition Home   Diet: Renal diet with strict 1.2 L fluid restriction per day  Special Instructions: If you have smoked or chewed Tobacco  in the last 2 yrs please stop smoking, stop any regular Alcohol  and or any Recreational drug use.  On your next visit with your primary care physician please Get Medicines reviewed and adjusted.  Please request your Prim.MD to go over all Hospital Tests and Procedure/Radiological results at the follow up, please get all Hospital records sent to your Prim MD by signing hospital release before  you go home.  If you experience worsening of your admission symptoms, develop shortness of breath, life threatening emergency, suicidal or homicidal thoughts you must seek medical attention immediately by calling 911 or calling your MD immediately  if symptoms less severe.  You Must read complete instructions/literature along with all the possible adverse reactions/side effects for all the Medicines you take and that have been prescribed to you. Take any new Medicines after you have completely understood and accpet all the possible adverse reactions/side effects.   Do not drive when taking Pain medications.  Do not take more than prescribed Pain, Sleep and Anxiety Medications  Wear Seat belts while driving.   Increase activity slowly   Complete by: As directed        Discharge Medications   Allergies as of 03/09/2024       Reactions   Vicodin [hydrocodone -acetaminophen ] Itching        Medication List     TAKE these medications    acetaminophen  650 MG CR tablet Commonly known as: TYLENOL  Take 1,300 mg by mouth daily as needed for pain.   amitriptyline 10 MG tablet Commonly known as: ELAVIL Take 10 mg by mouth at bedtime.   amLODipine  10 MG tablet Commonly known as: NORVASC  Take 10 mg by mouth daily.   buprenorphine 15 MCG/HR Commonly known as: BUTRANS Place 1 patch onto the skin once a week.   busPIRone 7.5 MG tablet Commonly known as: BUSPAR Take 7.5 mg by mouth 2 (two) times daily as needed (for anxiety).   hydrALAZINE  100 MG tablet Commonly known as: APRESOLINE  Take 100 mg by mouth 2 (two) times daily.   ibuprofen  200 MG tablet Commonly known as: ADVIL  Take 400 mg by mouth daily as needed (pain).   iron sucrose  20 MG/ML injection Commonly known as: VENOFER  Inject 200 mg into the vein once a week.   metoprolol  tartrate 50 MG tablet Commonly known as: LOPRESSOR  Take 150 mg by mouth 2 (two) times daily.   mirtazapine 7.5 MG tablet Commonly known as:  REMERON Take 7.5 mg by mouth at bedtime.   NEPHRO-VITE PO Take 0.8 mg by mouth. What changed: Another medication with the same name was changed. Make sure you understand how and when to take each.   multivitamin Tabs tablet Take 1 tablet by mouth at bedtime. What changed: when to take this   nicotine  14 mg/24hr patch  Commonly known as: NICODERM CQ  - dosed in mg/24 hours Place 1 patch (14 mg total) onto the skin daily. Start taking on: Mar 10, 2024   omeprazole  40 MG capsule Commonly known as: PRILOSEC Take 1 capsule (40 mg total) by mouth daily. What changed:  when to take this reasons to take this   oxyCODONE -acetaminophen  5-325 MG tablet Commonly known as: PERCOCET/ROXICET Take 1 tablet by mouth in the morning, at noon, and at bedtime.   senna 8.6 MG Tabs tablet Commonly known as: SENOKOT Take 1 tablet (8.6 mg total) by mouth daily as needed for mild constipation.   sevelamer  carbonate 800 MG tablet Commonly known as: RENVELA  Take 4 tablets (3,200 mg total) by mouth 3 (three) times daily with meals.         Follow-up Information     Maurice Gander, MD. Schedule an appointment as soon as possible for a visit in 1 week(s).   Specialty: Family Medicine Contact information: [address] [place] [address] [phone]                 Major procedures and Radiology Reports - PLEASE review detailed and final reports thoroughly  -       DG Chest 2 View Result Date: 03/08/2024 CLINICAL DATA:  Shortness of breath post dialysis EXAM: CHEST - 2 VIEW COMPARISON:  February 02, 2023 FINDINGS: The heart size and mediastinal contours are within normal limits. Both lungs are clear. The visualized skeletal structures are unremarkable. IMPRESSION: No active cardiopulmonary disease. Electronically Signed   By: Fredrich Jefferson M.D.   On: 03/08/2024 17:15    Micro Results     Recent Results (from the past 240 hours)  Resp panel by RT-PCR (RSV, Flu A&B, Covid) Anterior  Nasal Swab     Status: None   Collection Time: 03/08/24  8:54 PM   Specimen: Anterior Nasal Swab  Result Value Ref Range Status   SARS Coronavirus 2 by RT PCR NEGATIVE NEGATIVE Final   Influenza A by PCR NEGATIVE NEGATIVE Final   Influenza B by PCR NEGATIVE NEGATIVE Final    Comment: (NOTE) The Xpert Xpress SARS-CoV-2/FLU/RSV plus assay is intended as an aid in the diagnosis of influenza from Nasopharyngeal swab specimens and should not be used as a sole basis for treatment. Nasal washings and aspirates are unacceptable for Xpert Xpress SARS-CoV-2/FLU/RSV testing.  Fact Sheet for Patients: BloggerCourse.com  Fact Sheet for Healthcare Providers: SeriousBroker.it  This test is not yet approved or cleared by the [place]  FDA and has been authorized for detection and/or diagnosis of SARS-CoV-2 by FDA under an Emergency Use Authorization (EUA). This EUA will remain in effect (meaning this test can be used) for the duration of the COVID-19 declaration under Section 564(b)(1) of the Act, 21 U.S.C. section 360bbb-3(b)(1), unless the authorization is terminated or revoked.     Resp Syncytial Virus by PCR NEGATIVE NEGATIVE Final    Comment: (NOTE) Fact Sheet for Patients: BloggerCourse.com  Fact Sheet for Healthcare Providers: SeriousBroker.it  This test is not yet approved or cleared by the [place]  FDA and has been authorized for detection and/or diagnosis of SARS-CoV-2 by FDA under an Emergency Use Authorization (EUA). This EUA will remain in effect (meaning this test can be used) for the duration of the COVID-19 declaration under Section 564(b)(1) of the Act, 21 U.S.C. section 360bbb-3(b)(1), unless the authorization is terminated or revoked.  Performed at [facility], [address], [place], [place] [address]  MRSA Next Gen by PCR, Nasal     Status: None    Collection Time: 03/09/24  4:02 AM   Specimen: Nasal Mucosa; Nasal Swab  Result Value Ref Range Status   MRSA by PCR Next Gen NOT DETECTED NOT DETECTED Final    Comment: (NOTE) The GeneXpert MRSA Assay (FDA approved for NASAL specimens only), is one component of a comprehensive MRSA colonization surveillance program. It is not intended to diagnose MRSA infection nor to guide or monitor treatment for MRSA infections. Test performance is not FDA approved in patients less than 9 years old. Performed at [facility], [address], [place], [place] [address]     Today   Subjective    Yanziel Braunstein today has no headache,no chest abdominal pain,no new weakness tingling or numbness, feels much better wants to go home today.     Objective   Blood pressure 170/85, pulse 84, temperature 97.7 F (36.5 C), temperature source Oral, resp. rate 20, height 5\' 5"  (1.651 m), weight 56.7 kg, SpO2 97%.   Intake/Output Summary (Last 24 hours) at 03/09/2024 0936 Last data filed at 03/09/2024 0455 Gross per 24 hour  Intake 240 ml  Output --  Net 240 ml    Exam  Awake Alert, No new F.N deficits,    [place].AT,PERRAL Supple Neck,   Symmetrical Chest wall movement, Good air movement bilaterally, few rales RRR,No Gallops,   +ve B.Sounds, Abd Soft, Non tender,  No Cyanosis, Clubbing or edema    Data Review   Recent Labs  Lab 03/08/24 1730 03/09/24 0517  WBC 4.5 3.2*  HGB 11.1* 10.9*  HCT 35.7* 34.0*  PLT 89* 81*  MCV 105.0* 102.7*  MCH 32.6 32.9  MCHC 31.1 32.1  RDW 14.7 14.6    Recent Labs  Lab 03/08/24 1730 03/08/24 2140 03/09/24 0517  NA 141 141 141  K 6.6* 5.1 5.2*  CL 99 98 96*  CO2 28 29 28   ANIONGAP 14 14 17*  GLUCOSE 80 106* 192*  BUN 48* 49* 53*  CREATININE 8.94* 8.91* 9.82*  CALCIUM  9.5 10.0 9.4    Total Time in preparing paper work, data evaluation and todays exam - 35 minutes  Signature  -    Lynnwood Sauer M.D on 03/09/2024 at 9:36 AM   -  To page go  to [email or web address]

## 2024-03-09 NOTE — Progress Notes (Signed)
 Maurice Bell is here under observation for shortness of breath. Shortness of breath likely bronchospasm 2nd viral URI. CXR unremarkable. He's been compliant with his hemodialysis sessions in outpatient. Plan for dialysis today per his routine schedule. Discussed with Hospitalist. Plan for discharge after dialysis today if clinically stable. Will complete an official consult if he transitions to inpatient.  Dialysis orders: [facility]-TTS 3:15hrs BFR 500 DFR Auto 1.5 EDW 58.5kg 2K/2Ca R AVF  Jadene Maxwell, NP [facility]

## 2024-03-09 NOTE — Procedures (Signed)
 Received patient in bed to unit.  Alert and oriented.  Informed consent signed and in chart.   TX duration: 3 hours 15 min  Patient tolerated well.  Transported back to the room  Alert, without acute distress.  Hand-off given to patient's nurse.   Access used: right fistula Access issues: none  Total UF removed: 2.9 liters Medication(s) given: hectorol Clover Dao, RN [facility]

## 2024-03-09 NOTE — Progress Notes (Signed)
 Pt returned from HD. IV removed and AVS gone over with pt, all questions answered. Pt wants to eat supper and then leave. His son is now at work and unable to come pick him up. He said that he will call a cab when he is ready to leave. Will relay this to the night shift RN.

## 2024-03-09 NOTE — Plan of Care (Signed)

## 2024-03-09 NOTE — TOC Transition Note (Signed)
 Transition of Care [facility]) - Discharge Note   Patient Details  Name: Maurice Bell MRN: [id number] Date of Birth: 1955/11/28  Transition of [facility] CM/SW Contact:  Eusebio High, RN Phone Number: 03/09/2024, 10:02 AM   Clinical Narrative:    Patient will dc to home today after HD   No TOC needs identified Family will transport           Patient Goals and CMS Choice            Discharge Placement                       Discharge Plan and Services Additional resources added to the After Visit Summary for                                       Social Drivers of Health (SDOH) Interventions SDOH Screenings   Food Insecurity: No Food Insecurity (03/09/2024)  Housing: Unknown (03/09/2024)  Transportation Needs: No Transportation Needs (03/09/2024)  Utilities: Not At Risk (03/09/2024)  Depression (PHQ2-9): Low Risk  (12/06/2019)  Tobacco Use: High Risk (03/08/2024)     Readmission Risk Interventions     No data to display

## 2024-03-09 NOTE — Evaluation (Signed)
 Physical Therapy Evaluation Patient Details Name: Maurice Bell MRN: [id number] DOB: 1956-05-30 Today's Date: 03/09/2024  History of Present Illness  68 y.o. male who presented to the ED for evaluation of shortness of breath, Hyperkalemia. with medical history significant for ESRD on TTS HD, Hx of CVA, HTN, anemia of CKD, depression/anxiety, chronic pain syndrome, and tobacco use.  Clinical Impression  Pt admitted with above diagnosis. Mod I with assistive device PTA for ADLs and gait. Uses SPC for short distances and rollator for longer distances eg shopping. Reports multiple falls at home; these occur when he forgets to use assistive device. Recommend full time use of RW and OPPT follow-up. VSS, ambulates up to 175 feet today with RW at a supervision level without overt LOB, but shows some mild instability. SpO2 96% on RA. HR 86.  Pt currently with functional limitations due to the deficits listed below (see PT Problem List). Pt will benefit from acute skilled PT to increase their independence and safety with mobility to allow discharge.           If plan is discharge home, recommend the following: Assistance with cooking/housework;Assist for transportation   Can travel by private vehicle        Equipment Recommendations None recommended by PT  Recommendations for Other Services       Functional Status Assessment Patient has had a recent decline in their functional status and demonstrates the ability to make significant improvements in function in a reasonable and predictable amount of time.     Precautions / Restrictions Precautions Precautions: Fall Recall of Precautions/Restrictions: Intact Restrictions Weight Bearing Restrictions Per Provider Order: No      Mobility  Bed Mobility Overal bed mobility: Modified Independent             General bed mobility comments: extra time    Transfers Overall transfer level: Needs assistance Equipment used: Rolling walker (2  wheels) Transfers: Sit to/from Stand Sit to Stand: Supervision           General transfer comment: supervision for safety, stablizes with RW upon standing.    Ambulation/Gait Ambulation/Gait assistance: Supervision Gait Distance (Feet): 175 Feet Assistive device: Rolling walker (2 wheels) Gait Pattern/deviations: Step-through pattern, Decreased stride length, Antalgic Gait velocity: dec Gait velocity interpretation: <1.8 ft/sec, indicate of risk for recurrent falls   General Gait Details: Mild instability but able to self correct with RW for support. No overt LOB or buckling with this device. Educated on use, safety, and awareness. SpO2 96% on RA. HR 86.  Stairs            Wheelchair Mobility     Tilt Bed    Modified Rankin (Stroke Patients Only)       Balance Overall balance assessment: Needs assistance Sitting-balance support: No upper extremity supported, Feet supported Sitting balance-Leahy Scale: Good     Standing balance support: No upper extremity supported Standing balance-Leahy Scale: Fair                               Pertinent Vitals/Pain Pain Assessment Pain Assessment: Faces Faces Pain Scale: Hurts little more Pain Location: back Pain Descriptors / Indicators: Constant    Home Living Family/patient expects to be discharged to:: Private residence Living Arrangements: Children Available Help at Discharge: Family;Available PRN/intermittently Type of Home: House Home Access: Level entry       Home Layout: One level Home Equipment: Rollator (4 wheels);Cane - single  point Additional Comments: States he is having his bathroom updated to make more accessible in June    Prior Function Prior Level of Function : Independent/Modified Independent;History of Falls (last six months)             Mobility Comments: Walks with SPC short distances, rollator longer distances. States he's had several falls when he neglects to use  assistive device. ADLs Comments: ind.     Extremity/Trunk Assessment   Upper Extremity Assessment Upper Extremity Assessment: Defer to OT evaluation    Lower Extremity Assessment Lower Extremity Assessment: Generalized weakness       Communication   Communication Communication: No apparent difficulties    Cognition Arousal: Alert Behavior During Therapy: WFL for tasks assessed/performed   PT - Cognitive impairments: No apparent impairments                         Following commands: Intact       Cueing Cueing Techniques: Verbal cues     General Comments General comments (skin integrity, edema, etc.): VSS    Exercises     Assessment/Plan    PT Assessment Patient needs continued PT services  PT Problem List Decreased strength;Decreased range of motion;Decreased activity tolerance;Decreased balance;Decreased mobility;Decreased coordination;Decreased knowledge of use of DME       PT Treatment Interventions DME instruction;Gait training;Functional mobility training;Therapeutic activities;Therapeutic exercise;Balance training;Neuromuscular re-education;Patient/family education    PT Goals (Current goals can be found in the Care Plan section)  Acute Rehab PT Goals Patient Stated Goal: Get well, return home PT Goal Formulation: With patient Time For Goal Achievement: 03/23/24 Potential to Achieve Goals: Good    Frequency Min 2X/week     Co-evaluation               AM-PAC PT "6 Clicks" Mobility  Outcome Measure Help needed turning from your back to your side while in a flat bed without using bedrails?: None Help needed moving from lying on your back to sitting on the side of a flat bed without using bedrails?: None Help needed moving to and from a bed to a chair (including a wheelchair)?: A Little Help needed standing up from a chair using your arms (e.g., wheelchair or bedside chair)?: A Little Help needed to walk in hospital room?: A  Little Help needed climbing 3-5 steps with a railing? : A Little 6 Click Score: 20    End of Session Equipment Utilized During Treatment: Gait belt Activity Tolerance: Patient tolerated treatment well Patient left: in chair;with call bell/phone within reach;with chair alarm set Nurse Communication: Mobility status PT Visit Diagnosis: Unsteadiness on feet (R26.81);Other abnormalities of gait and mobility (R26.89);Repeated falls (R29.6);Muscle weakness (generalized) (M62.81);History of falling (Z91.81)    Time: 0981-1914 PT Time Calculation (min) (ACUTE ONLY): 18 min   Charges:   PT Evaluation $PT Eval Low Complexity: 1 Low   PT General Charges $$ ACUTE PT VISIT: 1 Visit         Jory Ng, PT, DPT [facility]  Rehabilitation Services Physical Therapist Office: [phone] Website: [email or web address]   Alinda Irani 03/09/2024, 10:25 AM

## 2024-03-09 NOTE — Discharge Instructions (Signed)
 Follow with Primary MD [name], MD in 7 days   Get CBC, CMP, 2 view Chest X ray -  checked next visit with your primary MD    Activity: As tolerated with Full fall precautions use walker/cane & assistance as needed  Disposition Home   Diet: Renal diet with strict 1.2 L fluid restriction per day  Special Instructions: If you have smoked or chewed Tobacco  in the last 2 yrs please stop smoking, stop any regular Alcohol  and or any Recreational drug use.  On your next visit with your primary care physician please Get Medicines reviewed and adjusted.  Please request your Prim.MD to go over all Hospital Tests and Procedure/Radiological results at the follow up, please get all Hospital records sent to your Prim MD by signing hospital release before you go home.  If you experience worsening of your admission symptoms, develop shortness of breath, life threatening emergency, suicidal or homicidal thoughts you must seek medical attention immediately by calling 911 or calling your MD immediately  if symptoms less severe.  You Must read complete instructions/literature along with all the possible adverse reactions/side effects for all the Medicines you take and that have been prescribed to you. Take any new Medicines after you have completely understood and accpet all the possible adverse reactions/side effects.   Do not drive when taking Pain medications.  Do not take more than prescribed Pain, Sleep and Anxiety Medications  Wear Seat belts while driving.

## 2024-03-10 LAB — HEPATITIS B SURFACE ANTIBODY, QUANTITATIVE: Hep B S AB Quant (Post): 2495 m[IU]/mL

## 2024-03-10 NOTE — Discharge Planning (Signed)
 [facility] Patient Discharge Orders- [facility] CLINIC: [facility]: Patient was under observation and NOT admitted.  Patient's name: Maurice Bell Admit/DC Dates: 03/08/2024 - 03/09/2024  Please continue current HD prescription. He received HD 03/09/24 and noted post HD weight 58.6kg. No change to EDW at this time.    D/C Meds to be reconciled by nurse after every discharge.  Completed By: Jadene Maxwell, NP   Reviewed by: MD:______ RN_______
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
## 2024-04-12 ENCOUNTER — Ambulatory Visit: Attending: Cardiology | Admitting: Cardiology

## 2024-04-13 ENCOUNTER — Encounter: Payer: Self-pay | Admitting: Cardiology

## 2024-04-14 ENCOUNTER — Ambulatory Visit: Admitting: Podiatry

## 2024-05-26 ENCOUNTER — Institutional Professional Consult (permissible substitution) (INDEPENDENT_AMBULATORY_CARE_PROVIDER_SITE_OTHER): Admitting: Otolaryngology

## 2024-08-04 ENCOUNTER — Other Ambulatory Visit: Payer: Self-pay | Admitting: Internal Medicine

## 2024-10-29 ENCOUNTER — Encounter (HOSPITAL_COMMUNITY): Payer: Self-pay | Admitting: Internal Medicine

## 2024-10-29 ENCOUNTER — Emergency Department (HOSPITAL_COMMUNITY)

## 2024-10-29 ENCOUNTER — Other Ambulatory Visit: Payer: Self-pay

## 2024-10-29 ENCOUNTER — Observation Stay (HOSPITAL_COMMUNITY)
Admission: EM | Admit: 2024-10-29 | Discharge: 2024-10-30 | Disposition: A | Attending: Emergency Medicine | Admitting: Emergency Medicine

## 2024-10-29 DIAGNOSIS — W19XXXA Unspecified fall, initial encounter: Secondary | ICD-10-CM | POA: Insufficient documentation

## 2024-10-29 DIAGNOSIS — F32A Depression, unspecified: Secondary | ICD-10-CM | POA: Insufficient documentation

## 2024-10-29 DIAGNOSIS — F10920 Alcohol use, unspecified with intoxication, uncomplicated: Secondary | ICD-10-CM | POA: Insufficient documentation

## 2024-10-29 DIAGNOSIS — S2242XA Multiple fractures of ribs, left side, initial encounter for closed fracture: Secondary | ICD-10-CM | POA: Diagnosis not present

## 2024-10-29 DIAGNOSIS — I120 Hypertensive chronic kidney disease with stage 5 chronic kidney disease or end stage renal disease: Secondary | ICD-10-CM | POA: Diagnosis not present

## 2024-10-29 DIAGNOSIS — N186 End stage renal disease: Secondary | ICD-10-CM | POA: Diagnosis not present

## 2024-10-29 DIAGNOSIS — D631 Anemia in chronic kidney disease: Secondary | ICD-10-CM | POA: Diagnosis not present

## 2024-10-29 DIAGNOSIS — M5146 Schmorl's nodes, lumbar region: Secondary | ICD-10-CM | POA: Insufficient documentation

## 2024-10-29 DIAGNOSIS — Z7982 Long term (current) use of aspirin: Secondary | ICD-10-CM | POA: Diagnosis not present

## 2024-10-29 DIAGNOSIS — R079 Chest pain, unspecified: Secondary | ICD-10-CM | POA: Diagnosis present

## 2024-10-29 DIAGNOSIS — K219 Gastro-esophageal reflux disease without esophagitis: Secondary | ICD-10-CM | POA: Diagnosis not present

## 2024-10-29 DIAGNOSIS — Z79899 Other long term (current) drug therapy: Secondary | ICD-10-CM | POA: Insufficient documentation

## 2024-10-29 DIAGNOSIS — Z760 Encounter for issue of repeat prescription: Secondary | ICD-10-CM

## 2024-10-29 DIAGNOSIS — F17210 Nicotine dependence, cigarettes, uncomplicated: Secondary | ICD-10-CM | POA: Insufficient documentation

## 2024-10-29 DIAGNOSIS — Z992 Dependence on renal dialysis: Secondary | ICD-10-CM | POA: Insufficient documentation

## 2024-10-29 DIAGNOSIS — I10 Essential (primary) hypertension: Secondary | ICD-10-CM | POA: Diagnosis present

## 2024-10-29 DIAGNOSIS — S2239XA Fracture of one rib, unspecified side, initial encounter for closed fracture: Secondary | ICD-10-CM | POA: Diagnosis present

## 2024-10-29 DIAGNOSIS — D638 Anemia in other chronic diseases classified elsewhere: Secondary | ICD-10-CM | POA: Diagnosis present

## 2024-10-29 DIAGNOSIS — J90 Pleural effusion, not elsewhere classified: Secondary | ICD-10-CM | POA: Insufficient documentation

## 2024-10-29 DIAGNOSIS — E875 Hyperkalemia: Secondary | ICD-10-CM

## 2024-10-29 DIAGNOSIS — R0789 Other chest pain: Principal | ICD-10-CM

## 2024-10-29 LAB — BASIC METABOLIC PANEL WITH GFR
Anion gap: 13 (ref 5–15)
BUN: 41 mg/dL — ABNORMAL HIGH (ref 8–23)
CO2: 33 mmol/L — ABNORMAL HIGH (ref 22–32)
Calcium: 9.6 mg/dL (ref 8.9–10.3)
Chloride: 93 mmol/L — ABNORMAL LOW (ref 98–111)
Creatinine, Ser: 7.76 mg/dL — ABNORMAL HIGH (ref 0.61–1.24)
GFR, Estimated: 7 mL/min — ABNORMAL LOW
Glucose, Bld: 94 mg/dL (ref 70–99)
Potassium: 5.5 mmol/L — ABNORMAL HIGH (ref 3.5–5.1)
Sodium: 140 mmol/L (ref 135–145)

## 2024-10-29 LAB — CBC WITH DIFFERENTIAL/PLATELET
Abs Immature Granulocytes: 0 K/uL (ref 0.00–0.07)
Basophils Absolute: 0 K/uL (ref 0.0–0.1)
Basophils Relative: 1 %
Eosinophils Absolute: 0.1 K/uL (ref 0.0–0.5)
Eosinophils Relative: 3 %
HCT: 32 % — ABNORMAL LOW (ref 39.0–52.0)
Hemoglobin: 10.6 g/dL — ABNORMAL LOW (ref 13.0–17.0)
Immature Granulocytes: 0 %
Lymphocytes Relative: 23 %
Lymphs Abs: 1 K/uL (ref 0.7–4.0)
MCH: 33.3 pg (ref 26.0–34.0)
MCHC: 33.1 g/dL (ref 30.0–36.0)
MCV: 100.6 fL — ABNORMAL HIGH (ref 80.0–100.0)
Monocytes Absolute: 0.8 K/uL (ref 0.1–1.0)
Monocytes Relative: 17 %
Neutro Abs: 2.6 K/uL (ref 1.7–7.7)
Neutrophils Relative %: 56 %
Platelets: 167 K/uL (ref 150–400)
RBC: 3.18 MIL/uL — ABNORMAL LOW (ref 4.22–5.81)
RDW: 14.1 % (ref 11.5–15.5)
WBC: 4.5 K/uL (ref 4.0–10.5)
nRBC: 0 % (ref 0.0–0.2)

## 2024-10-29 LAB — COMPREHENSIVE METABOLIC PANEL WITH GFR
ALT: 17 U/L (ref 0–44)
AST: 36 U/L (ref 15–41)
Albumin: 4.2 g/dL (ref 3.5–5.0)
Alkaline Phosphatase: 95 U/L (ref 38–126)
Anion gap: 14 (ref 5–15)
BUN: 40 mg/dL — ABNORMAL HIGH (ref 8–23)
CO2: 30 mmol/L (ref 22–32)
Calcium: 9.3 mg/dL (ref 8.9–10.3)
Chloride: 94 mmol/L — ABNORMAL LOW (ref 98–111)
Creatinine, Ser: 7.58 mg/dL — ABNORMAL HIGH (ref 0.61–1.24)
GFR, Estimated: 7 mL/min — ABNORMAL LOW
Glucose, Bld: 79 mg/dL (ref 70–99)
Potassium: 6.2 mmol/L — ABNORMAL HIGH (ref 3.5–5.1)
Sodium: 138 mmol/L (ref 135–145)
Total Bilirubin: 0.3 mg/dL (ref 0.0–1.2)
Total Protein: 7.4 g/dL (ref 6.5–8.1)

## 2024-10-29 LAB — I-STAT CHEM 8, ED
BUN: 49 mg/dL — ABNORMAL HIGH (ref 8–23)
Calcium, Ion: 0.94 mmol/L — ABNORMAL LOW (ref 1.15–1.40)
Chloride: 99 mmol/L (ref 98–111)
Creatinine, Ser: 8.2 mg/dL — ABNORMAL HIGH (ref 0.61–1.24)
Glucose, Bld: 79 mg/dL (ref 70–99)
HCT: 40 % (ref 39.0–52.0)
Hemoglobin: 13.6 g/dL (ref 13.0–17.0)
Potassium: 5.8 mmol/L — ABNORMAL HIGH (ref 3.5–5.1)
Sodium: 138 mmol/L (ref 135–145)
TCO2: 33 mmol/L — ABNORMAL HIGH (ref 22–32)

## 2024-10-29 MED ORDER — HYDRALAZINE HCL 20 MG/ML IJ SOLN: 10.0000 mg | INTRAMUSCULAR | Status: DC | PRN

## 2024-10-29 MED ORDER — HYDRALAZINE HCL 50 MG PO TABS
100.0000 mg | ORAL_TABLET | Freq: Two times a day (BID) | ORAL | Status: DC
  Filled 2024-10-29: qty 2

## 2024-10-29 MED ORDER — ACETAMINOPHEN 325 MG PO TABS: 650.0000 mg | ORAL_TABLET | Freq: Four times a day (QID) | ORAL | Status: DC | PRN

## 2024-10-29 MED ORDER — AMLODIPINE BESYLATE 5 MG PO TABS
10.0000 mg | ORAL_TABLET | Freq: Every day | ORAL | Status: DC
  Filled 2024-10-29: qty 2

## 2024-10-29 MED ORDER — IBUPROFEN 800 MG PO TABS
800.0000 mg | ORAL_TABLET | Freq: Three times a day (TID) | ORAL | Status: DC
  Administered 2024-10-29 – 2024-10-30 (×2): 800 mg via ORAL
  Filled 2024-10-29 (×3): qty 1

## 2024-10-29 MED ORDER — HYDRALAZINE HCL 50 MG PO TABS: 100.0000 mg | ORAL_TABLET | Freq: Two times a day (BID) | ORAL | Status: DC

## 2024-10-29 MED ORDER — SEVELAMER CARBONATE 800 MG PO TABS
3200.0000 mg | ORAL_TABLET | Freq: Three times a day (TID) | ORAL | Status: DC
  Administered 2024-10-30: 3200 mg via ORAL
  Filled 2024-10-29: qty 4

## 2024-10-29 MED ORDER — AMITRIPTYLINE HCL 10 MG PO TABS
10.0000 mg | ORAL_TABLET | Freq: Every day | ORAL | Status: DC
  Administered 2024-10-29: 10 mg via ORAL
  Filled 2024-10-29 (×2): qty 1

## 2024-10-29 MED ORDER — ACETAMINOPHEN 650 MG RE SUPP: 650.0000 mg | Freq: Four times a day (QID) | RECTAL | Status: DC | PRN

## 2024-10-29 MED ORDER — METOPROLOL TARTRATE 50 MG PO TABS: 150.0000 mg | ORAL_TABLET | Freq: Two times a day (BID) | ORAL | Status: DC

## 2024-10-29 MED ORDER — GABAPENTIN 300 MG PO CAPS
300.0000 mg | ORAL_CAPSULE | Freq: Three times a day (TID) | ORAL | Status: DC
  Administered 2024-10-29 – 2024-10-30 (×2): 300 mg via ORAL
  Filled 2024-10-29 (×2): qty 1

## 2024-10-29 MED ORDER — LIDOCAINE 5 % EX PTCH
1.0000 | MEDICATED_PATCH | CUTANEOUS | Status: DC
  Administered 2024-10-29: 1 via TRANSDERMAL
  Filled 2024-10-29: qty 1

## 2024-10-29 MED ORDER — AMLODIPINE BESYLATE 5 MG PO TABS: 10.0000 mg | ORAL_TABLET | Freq: Every day | ORAL | Status: DC

## 2024-10-29 MED ORDER — METHOCARBAMOL 500 MG PO TABS: 500.0000 mg | ORAL_TABLET | Freq: Four times a day (QID) | ORAL | Status: DC | PRN

## 2024-10-29 MED ORDER — ACETAMINOPHEN 325 MG PO TABS
650.0000 mg | ORAL_TABLET | Freq: Four times a day (QID) | ORAL | Status: DC
  Administered 2024-10-29 – 2024-10-30 (×4): 650 mg via ORAL
  Filled 2024-10-29 (×3): qty 2

## 2024-10-29 MED ORDER — BUSPIRONE HCL 15 MG PO TABS: 7.5000 mg | ORAL_TABLET | Freq: Two times a day (BID) | ORAL | Status: DC | PRN

## 2024-10-29 MED ORDER — METOPROLOL TARTRATE 25 MG PO TABS
150.0000 mg | ORAL_TABLET | Freq: Two times a day (BID) | ORAL | Status: DC
  Filled 2024-10-29 (×2): qty 3

## 2024-10-29 MED ORDER — HYDROMORPHONE HCL 1 MG/ML IJ SOLN: 0.5000 mg | INTRAMUSCULAR | Status: DC | PRN

## 2024-10-29 MED ORDER — OXYCODONE-ACETAMINOPHEN 5-325 MG PO TABS
1.0000 | ORAL_TABLET | Freq: Once | ORAL | Status: AC
  Administered 2024-10-29: 1 via ORAL
  Filled 2024-10-29: qty 1

## 2024-10-29 MED ORDER — IOHEXOL 350 MG/ML SOLN
75.0000 mL | Freq: Once | INTRAVENOUS | Status: AC | PRN
  Administered 2024-10-29: 75 mL via INTRAVENOUS

## 2024-10-29 MED ORDER — PANTOPRAZOLE SODIUM 40 MG PO TBEC: 40.0000 mg | DELAYED_RELEASE_TABLET | Freq: Every day | ORAL | Status: DC

## 2024-10-29 MED ORDER — SENNA 8.6 MG PO TABS: 1.0000 | ORAL_TABLET | Freq: Every day | ORAL | Status: DC | PRN

## 2024-10-29 MED ORDER — RENA-VITE PO TABS
1.0000 | ORAL_TABLET | Freq: Every day | ORAL | Status: DC
  Administered 2024-10-29: 1 via ORAL
  Filled 2024-10-29 (×2): qty 1

## 2024-10-29 MED ORDER — MIRTAZAPINE 15 MG PO TABS
7.5000 mg | ORAL_TABLET | Freq: Every day | ORAL | Status: DC
  Filled 2024-10-29: qty 0.5

## 2024-10-29 MED ORDER — OXYCODONE HCL 5 MG PO TABS: 5.0000 mg | ORAL_TABLET | ORAL | Status: DC | PRN

## 2024-10-29 MED ORDER — SODIUM ZIRCONIUM CYCLOSILICATE 10 G PO PACK
10.0000 g | PACK | Freq: Once | ORAL | Status: AC
  Administered 2024-10-29: 10 g via ORAL
  Filled 2024-10-29: qty 1

## 2024-10-29 NOTE — ED Notes (Signed)
 Pt placed on cardiac leads at this time.

## 2024-10-29 NOTE — ED Provider Triage Note (Signed)
 Emergency Medicine Provider Triage Evaluation Note  Dinari Stgermaine , a 68 y.o. male  was evaluated in triage.  Pt complains of fall. He reports that he has a known problem with sleep walking. He reports that he remembers falling and hit his left chest on a chair. Denies LOC. Denies head injury or other pain. Denies blood thinner use.  Review of Systems  Positive:  Negative:  Physical Exam  BP (!) 170/80 (BP Location: Left Arm)   Pulse 98   Temp 98 F (36.7 C)   Resp 20   Ht 5' 5 (1.651 m)   Wt 63 kg   BMI 23.11 kg/m  Gen:   Awake, no distress   Resp:  Normal effort  MSK:   Moves extremities without difficulty  Other:  Some tenderness to the lower left ribs/upper abdomen. No significant step off appreciated. Speaking in full sentences.   Medical Decision Making  Medically screening exam initiated at 10:09 AM.  Appropriate orders placed.  Michaelanthony Kempton was informed that the remainder of the evaluation will be completed by another provider, this initial triage assessment does not replace that evaluation, and the importance of remaining in the ED until their evaluation is complete.  CXR ordered to begin with.    Bernis Ernst, PA-C 10/29/24 1011

## 2024-10-29 NOTE — ED Provider Notes (Signed)
 " [place] [facility] Provider Note   CSN: [id number] Arrival date & time: 10/29/24  9191     Patient presents with: Maurice Bell is a 68 y.o. male history of ESRD on Tuesday/Thursday/Saturday dialysis, COPD, presents emerged on today for evaluation of left-sided lower chest pain/ left upper quadrant pain status post fall.  Earlier this morning, patient reports that he was sleepwalking, reports that he has known history of sleepwalking, and woke up, tripped over a chair, and then proceeded to fall hitting his chest and abdomen on the chair.  Denies hitting his head.  Denies pain to his head, neck, extremities, back.  Was ambulatory afterwards.  Denies any anticoagulation use.  Last dialysis was Saturday it was uncomplicated per patient.  He no longer makes urine.   Fall Pertinent negatives include no headaches and no shortness of breath.       Prior to Admission medications  Medication Sig Start Date End Date Taking? Authorizing Provider  acetaminophen  (TYLENOL ) 650 MG CR tablet Take 1,300 mg by mouth daily as needed for pain.    [provider]  amitriptyline  (ELAVIL ) 10 MG tablet Take 10 mg by mouth at bedtime.    [provider]  amLODipine  (NORVASC ) 10 MG tablet Take 10 mg by mouth daily.    [provider]  B Complex-C-Folic Acid  (NEPHRO-VITE PO) Take 0.8 mg by mouth.    [provider]  buprenorphine (BUTRANS) 15 MCG/HR Place 1 patch onto the skin once a week. 07/29/22   [provider]  busPIRone  (BUSPAR ) 7.5 MG tablet Take 7.5 mg by mouth 2 (two) times daily as needed (for anxiety). 02/02/24   [provider]  hydrALAZINE  (APRESOLINE ) 100 MG tablet Take 100 mg by mouth 2 (two) times daily. 07/09/21   [provider]  ibuprofen  (ADVIL ) 200 MG tablet Take 400 mg by mouth daily as needed (pain).    [provider]  iron sucrose  (VENOFER ) 20 MG/ML injection Inject 200 mg  into the vein once a week. 03/04/21   [provider]  metoprolol  tartrate (LOPRESSOR ) 50 MG tablet Take 150 mg by mouth 2 (two) times daily.    [provider]  mirtazapine  (REMERON ) 7.5 MG tablet Take 7.5 mg by mouth at bedtime. 08/20/21   [provider]  multivitamin (RENA-VIT) TABS tablet Take 1 tablet by mouth at bedtime. Patient taking differently: Take 1 tablet by mouth daily. 09/11/18   [name], [name], MD  nicotine  (NICODERM CQ  - DOSED IN MG/24 HOURS) 14 mg/24hr patch Place 1 patch (14 mg total) onto the skin daily. 03/10/24   Singh, Prashant K, MD  omeprazole  (PRILOSEC) 40 MG capsule Take 1 capsule (40 mg total) by mouth daily. NEEDS APPT FOR FURTHER REFILLS 08/04/24   Pyrtle, Gordy HERO, MD  oxyCODONE -acetaminophen  (PERCOCET/ROXICET) 5-325 MG tablet Take 1 tablet by mouth in the morning, at noon, and at bedtime.    [provider]  senna (SENOKOT) 8.6 MG TABS tablet Take 1 tablet (8.6 mg total) by mouth daily as needed for mild constipation. 10/25/20   Watson, Julia, MD  sevelamer  carbonate (RENVELA ) 800 MG tablet Take 4 tablets (3,200 mg total) by mouth 3 (three) times daily with meals. 09/11/18   [name], Nydia POUR, MD    Allergies: Vicodin [hydrocodone -acetaminophen ]    Review of Systems  Constitutional:  Negative for chills and fever.  Respiratory:  Negative for shortness of breath.   Gastrointestinal:  Negative for  constipation, diarrhea, nausea and vomiting.  Neurological:  Negative for syncope and headaches.    Updated Vital Signs BP (!) 180/91   Pulse 82   Temp 98 F (36.7 C)   Resp 19   Ht 5' 5 (1.651 m)   Wt 63 kg   SpO2 100%   BMI 23.11 kg/m   Physical Exam Vitals and nursing note reviewed.  Constitutional:      General: He is not in acute distress.    Appearance: He is not ill-appearing or toxic-appearing.  Eyes:     General: No scleral icterus. Cardiovascular:     Rate and Rhythm: Normal rate.  Pulmonary:     Effort:  Pulmonary effort is normal. No respiratory distress.  Chest:     Chest wall: Tenderness present.       Comments: Tenderness palpation to the marked area above.  No overlying skin changes or signs of trauma noted.  No step-off or deformity. Abdominal:     General: There is no distension.     Palpations: Abdomen is soft.     Tenderness: There is abdominal tenderness.      Comments: Tenderness to the marked area above.  No acute overlying skin change noted or sign of trauma.  No step-off or deformity.  Skin:    General: Skin is warm and dry.  Neurological:     Mental Status: He is alert.     (all labs ordered are listed, but only abnormal results are displayed) Labs Reviewed  I-STAT CHEM 8, ED    EKG: None  Radiology: DG Chest 2 View Result Date: 10/29/2024 EXAM: 2 VIEW(S) XRAY OF THE CHEST 10/29/2024 10:39:00 AM COMPARISON: 03/08/2024 CLINICAL HISTORY: 69 year old male with shortness of breath and fall striking left side with pain. FINDINGS: LUNGS AND PLEURA: Bibasilar linear opacities, chronic atelectasis or scarring . No pleural effusion. No pneumothorax. HEART AND MEDIASTINUM: Persistent cardiomegaly. No acute abnormality of the mediastinal silhouette. BONES AND SOFT TISSUES: Intact cervical spinal fixation hardware. No acute osseous abnormality. IMPRESSION: 1. No acute cardiopulmonary abnormality; bibasilar atelectasis/scarring, stable cardiomegaly. Electronically signed by: Helayne Hurst MD 10/29/2024 11:58 AM EST RP Workstation: [id number]    Procedures   Medications Ordered in the ED  lidocaine  (LIDODERM ) 5 % 1 patch (has no administration in time range)   Medical Decision Making Amount and/or Complexity of Data Reviewed Radiology: ordered.  Risk Prescription drug management.   68 y.o. male presents to the ER for evaluation of left-sided lower rib pain/upper abdominal pain status post fall. Differential diagnosis includes but is not limited to contusion, rib fracture,  trauma. Vital signs elevated BP otherwise unremarkable. Physical exam as noted above.   Lung sounds are clear, no hypoxia. CXR ordered in triage.   CXR shows  1. No acute cardiopulmonary abnormality; bibasilar atelectasis/scarring, stable cardiomegaly. Per radiologist's interpretation.    Given the pain is in the patients abdomen as well as lower ribcage, will order CT scan to r/o internal trauma. No outwards signs of trauma. Patient is a T/Th/Sat Dialysis patient. Last session was yesterday. Does not make urine. Will order contrasted study. Lidocaine  patch ordered.   CT CAP pending at this time. Will hand off to oncoming shift.   3:18 PM Care of Quayshawn Nin  transferred to and Dr. Guillermina at the end of my shift as the patient will require reassessment once labs/imaging have resulted. Patient presentation, ED course, and plan of care discussed with review of all pertinent labs and imaging. Please see his/her  note for further details regarding further ED course and disposition. Plan at time of handoff is follow up on CT imaging. Dispo to be determined based on results, but anticipate discharge home. This may be altered or completely changed at the discretion of the oncoming team pending results of further workup.  Portions of this report may have been transcribed using voice recognition software. Every effort was made to ensure accuracy; however, inadvertent computerized transcription errors may be present.    Final diagnoses:  Left-sided chest wall pain    ED Discharge Orders     None          Bernis Ernst, [place] 10/29/24 1527  "

## 2024-10-29 NOTE — ED Provider Notes (Signed)
 Assume Care - Medical Decision Making  Care of patient assumed from previous emergency medicine provider at 1500. See their note for further details of history, physical exam and plan.  Briefly, Maurice Bell is a 68 y.o. male who presents with: Concerns after a fall while sleepwalking.  Reassessment: I personally reassessed the patient:  Vital Signs:  The most current vitals were  Vitals:   10/29/24 0813 10/29/24 1245 10/29/24 1645 10/29/24 1816  BP:  (!) 180/91 (!) 183/84   Pulse:  82 92   Resp:  19 18   Temp:   98.1 F (36.7 C) 98.5 F (36.9 C)  TempSrc:   Oral Oral  SpO2:  100% 95%   Weight: 63 kg     Height: 5' 5 (1.651 m)      Hemodynamics:  The patient is hemodynamically stable. Mental Status:  The patient is alert  Additional MDM/ED Course: Patient handed off in hemodynamically stable condition.  BMP resulted with hyperkalemia to 5.8.  Will obtain EKG and repeat CMP to evaluate for true hyperkalemia or possibly hyperkalemia in the setting of hemolysis.  EKG here with right bundle branch block and first-degree AV block.  Mildly prolonged QRS at 140, but improved from prior EKGs.  Largely unchanged from prior.  Rhythm not junctional and no peaked T waves.  Does have some mild QRS widening, but overall not concerning for severe hyperkalemia.  Patient has not missed any dialysis sessions recently and also has planned dialysis tomorrow.  Received a full session of dialysis yesterday.  Touched base with nephrology given mild hyperkalemia in the ED.  Repeat on BMP 5.5.  Given patient is getting dialysis tomorrow on the holiday schedule, nephrology feels patient stable for discharge home with dialysis in the morning.  Will touch base with surgery given small small hemothorax versus pleural effusion on CT scan.  Will also obtain CBC to evaluate for blood loss.  CT with 2 left-sided rib fractures, agree with radiology.  Surgery feel stable for discharge if CBC stable and pain  controlled.  Patient I-S here 1250/2300 predicted.  Greater than 50%.  Unable to get good control of patient's pain here in the emergency department though.  So we will admit for pain control.  Patient admitted to the medicine service for further workup and management.  Disposition:  I discussed the case with Franky who graciously agreed to admit the patient to their service for continued care.   Clinical Impression:  1. Left-sided chest wall pain   2. Hyperkalemia    The plan for this patient was discussed with Dr. Francesca, who voiced agreement and who oversaw evaluation and treatment of this patient.   Electronically signed by:   Glendia Carlin Ancona, M.D. PGY-2, Emergency Medicine      Ancona Glendia, MD 10/29/24 2133    Francesca Elsie CROME, MD 10/29/24 2318

## 2024-10-29 NOTE — Discharge Instructions (Addendum)
 You were seen today for left chest wall pain. While you were here we monitored your vitals, preformed a physical exam, and CT scan. These were all reassuring and there is no indication for any further testing or intervention in the emergency department at this time.   Things to do:  - Follow up with your primary care provider within the next 1-2 weeks - Please continue to use Tylenol  at home for pain.  -Your insurance does not cover prescriptions for lidocaine  patches, please pick them up at the store as they are similar to the ones that we can give you here in the emergency department. - Please go to dialysis tomorrow as scheduled - We also found a lesion on your kidney, please follow-up with your PCP in the outpatient setting for prompt renal CT or MRI as this could be cancer.  Return to the emergency department if you have any new or worsening symptoms including difficulty breathing, worsening chest wall pain, hemoptysis or coughing up blood, inability tolerate p.o., pain not amenable to Tylenol , or if you have any other concerns.

## 2024-10-29 NOTE — ED Triage Notes (Signed)
 Patient arrives via [facility] for fall. Patient with hx of sleep walking. Hit left side of chair. Left chest wall pain. No hematoma, no LOC. Dialysis patient last tx yesterday.   EMS vitals BP 164/78 HR 96 96 on room air
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
## 2024-10-29 NOTE — ED Notes (Signed)
 Patient transported to X-ray
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
## 2024-10-30 ENCOUNTER — Other Ambulatory Visit (HOSPITAL_COMMUNITY): Payer: Self-pay

## 2024-10-30 ENCOUNTER — Observation Stay (HOSPITAL_COMMUNITY)

## 2024-10-30 DIAGNOSIS — N186 End stage renal disease: Secondary | ICD-10-CM | POA: Diagnosis not present

## 2024-10-30 DIAGNOSIS — S2242XA Multiple fractures of ribs, left side, initial encounter for closed fracture: Secondary | ICD-10-CM | POA: Diagnosis not present

## 2024-10-30 DIAGNOSIS — I10 Essential (primary) hypertension: Secondary | ICD-10-CM | POA: Diagnosis not present

## 2024-10-30 DIAGNOSIS — D638 Anemia in other chronic diseases classified elsewhere: Secondary | ICD-10-CM | POA: Diagnosis not present

## 2024-10-30 LAB — CBC
HCT: 31.5 % — ABNORMAL LOW (ref 39.0–52.0)
Hemoglobin: 10.3 g/dL — ABNORMAL LOW (ref 13.0–17.0)
MCH: 33.4 pg (ref 26.0–34.0)
MCHC: 32.7 g/dL (ref 30.0–36.0)
MCV: 102.3 fL — ABNORMAL HIGH (ref 80.0–100.0)
Platelets: 99 K/uL — ABNORMAL LOW (ref 150–400)
RBC: 3.08 MIL/uL — ABNORMAL LOW (ref 4.22–5.81)
RDW: 14.2 % (ref 11.5–15.5)
WBC: 3.9 K/uL — ABNORMAL LOW (ref 4.0–10.5)
nRBC: 0 % (ref 0.0–0.2)

## 2024-10-30 LAB — COMPREHENSIVE METABOLIC PANEL WITH GFR
ALT: 15 U/L (ref 0–44)
AST: 30 U/L (ref 15–41)
Albumin: 3.9 g/dL (ref 3.5–5.0)
Alkaline Phosphatase: 91 U/L (ref 38–126)
Anion gap: 14 (ref 5–15)
BUN: 49 mg/dL — ABNORMAL HIGH (ref 8–23)
CO2: 31 mmol/L (ref 22–32)
Calcium: 9.1 mg/dL (ref 8.9–10.3)
Chloride: 94 mmol/L — ABNORMAL LOW (ref 98–111)
Creatinine, Ser: 8.92 mg/dL — ABNORMAL HIGH (ref 0.61–1.24)
GFR, Estimated: 6 mL/min — ABNORMAL LOW
Glucose, Bld: 113 mg/dL — ABNORMAL HIGH (ref 70–99)
Potassium: 5.3 mmol/L — ABNORMAL HIGH (ref 3.5–5.1)
Sodium: 139 mmol/L (ref 135–145)
Total Bilirubin: 0.2 mg/dL (ref 0.0–1.2)
Total Protein: 6.8 g/dL (ref 6.5–8.1)

## 2024-10-30 MED ORDER — LIDOCAINE 5 % EX PTCH
1.0000 | MEDICATED_PATCH | CUTANEOUS | 0 refills | Status: AC
  Filled 2024-10-30: qty 30, 30d supply, fill #0

## 2024-10-30 MED ORDER — LIDOCAINE HCL (PF) 1 % IJ SOLN: 5.0000 mL | INTRAMUSCULAR | Status: DC | PRN

## 2024-10-30 MED ORDER — PENTAFLUOROPROP-TETRAFLUOROETH EX AERO: 1.0000 | INHALATION_SPRAY | CUTANEOUS | Status: DC | PRN

## 2024-10-30 MED ORDER — GABAPENTIN 300 MG PO CAPS
300.0000 mg | ORAL_CAPSULE | Freq: Every day | ORAL | 0 refills | Status: AC
  Filled 2024-10-30: qty 60, 60d supply, fill #0

## 2024-10-30 MED ORDER — SENNA 8.6 MG PO TABS: 1.0000 | ORAL_TABLET | Freq: Every day | ORAL | 0 refills | Status: AC | PRN

## 2024-10-30 MED ORDER — LIDOCAINE-PRILOCAINE 2.5-2.5 % EX CREA: 1.0000 | TOPICAL_CREAM | CUTANEOUS | Status: DC | PRN

## 2024-10-30 MED ORDER — ACETAMINOPHEN 325 MG PO TABS
ORAL_TABLET | ORAL | Status: AC
  Filled 2024-10-30: qty 2

## 2024-10-30 MED ORDER — METHOCARBAMOL 500 MG PO TABS
500.0000 mg | ORAL_TABLET | Freq: Four times a day (QID) | ORAL | 0 refills | Status: AC | PRN
  Filled 2024-10-30: qty 12, 3d supply, fill #0

## 2024-10-30 MED ORDER — RENA-VITE PO TABS: 1.0000 | ORAL_TABLET | Freq: Every day | ORAL | 3 refills | Status: AC

## 2024-10-30 MED ORDER — GABAPENTIN 300 MG PO CAPS
300.0000 mg | ORAL_CAPSULE | Freq: Three times a day (TID) | ORAL | 0 refills | Status: DC
  Filled 2024-10-30: qty 60, 20d supply, fill #0

## 2024-10-30 NOTE — Progress Notes (Signed)
" °   10/30/24 1148  Vitals  Temp 98.2 F (36.8 C)  Temp Source Oral  BP (!) 152/70  MAP (mmHg) 95  Pulse Rate 82  ECG Heart Rate 83  Resp 15  Oxygen Therapy  SpO2 100 %  O2 Device Room Air  During Treatment Monitoring  HD Safety Checks Performed Yes  Intra-Hemodialysis Comments Tx completed;Tolerated well  Post Treatment  Dialyzer Clearance Clear  Liters Processed 84  Fluid Removed (mL) 1300 mL  Tolerated HD Treatment Yes (Had some hypotension)  AVG/AVF Arterial Site Held (minutes) 6 minutes  AVG/AVF Venous Site Held (minutes) 6 minutes  Fistula / Graft Right Upper arm Arteriovenous fistula  Placement Date/Time: 02/15/19 1204   Orientation: Right  Access Location: (c) Upper arm  Access Type: Arteriovenous fistula  Site Condition No complications  Fistula / Graft Assessment Present;Thrill;Bruit  Status Deaccessed  Drainage Description None    "

## 2024-10-30 NOTE — Procedures (Signed)
 Seen and examined on dialysis.  Blood pressure 120/66 and HR 75.  He tells me that his BP dropped earlier and HD RN reports systolics went to the 70's.  UF is off.  Right AVF in use.  Wet cloth on head- nausea with hypotension earlier. Now resolved.  Here for pain control after fall.   Full note to follow  Katheryn JAYSON Saba, MD 10/30/2024 11:01 AM
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
## 2024-10-30 NOTE — TOC CAGE-AID Note (Signed)
 Transition of Care [facility]) - CAGE-AID Screening   Patient Details  Name: Brandom Kerwin MRN: [id number] Date of Birth: Jul 11, 1956  Transition of Care [facility]) CM/SW Contact:    Carmelina Balducci E Alanys Godino, LCSW Phone Number: 10/30/2024, 10:58 AM   Clinical Narrative: No SA noted.   CAGE-AID Screening:    Have You Ever Felt You Ought to Cut Down on Your Drinking or Drug Use?: No Have People Annoyed You By Critizing Your Drinking Or Drug Use?: No Have You Felt Bad Or Guilty About Your Drinking Or Drug Use?: No Have You Ever Had a Drink or Used Drugs First Thing In The Morning to Steady Your Nerves or to Get Rid of a Hangover?: No CAGE-AID Score: 0  Substance Abuse Education Offered: No
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
## 2024-10-30 NOTE — ED Notes (Signed)
 This RN did not receive a report for this PT from previous RN. This PT also has past due medications.This RN asked PT how he is getting home since he's discharged and PT states he can not get into his home until son gets off work at [employer].

## 2024-10-30 NOTE — Discharge Planning (Signed)
 [facility] Patient Discharge Orders - [facility] CLINIC: [place]  Patient's name: [name] Admit/DC Dates: 10/29/2024 - 10/30/2024  DISCHARGE DIAGNOSES: Fall with acute displaced 5th, non-displaced 6th L rib fractures   Hyperkalemia  HD ORDER CHANGES: Heparin  change: no EDW Change: no  Bath Change: no  ANEMIA MANAGEMENT: Aranesp : Given: no   ESA dose for discharge: mircera per protocol IV Iron dose at discharge: per protocolo Transfusion: Given: no  BONE/MINERAL MEDICATIONS: Hectorol /Calcitriol  change: no Sensipar/Parsabiv change: no  ACCESS INTERVENTION/CHANGE: no Details:   RECENT LABS: Recent Labs  Lab 10/30/24 0314  HGB 10.3*  NA 139  K 5.3*  CALCIUM  9.1  ALBUMIN  3.9    IV ANTIBIOTICS: no Details:  OTHER ANTICOAGULATION: no Details:  OTHER/APPTS/LAB ORDERS: - Knows next HD is Wed per holiday schedule. - Incidental L kidney mass on CT -> needs dedicated MRI, pls order  D/C Meds to be reconciled by nurse after every discharge.  Completed By: Izetta Boehringer, PA-C [facility] Pager [phone]   Reviewed by: MD:______ RN_______

## 2024-10-30 NOTE — ED Notes (Signed)
 Patient transported to X-ray

## 2024-10-30 NOTE — Progress Notes (Signed)
 Pt receives out-pt HD at [facility], TTS, 5am chair time. Will continue to assist as needed.    Firas Guardado Dialysis Navigator [phone]

## 2024-10-31 ENCOUNTER — Other Ambulatory Visit (HOSPITAL_COMMUNITY): Payer: Self-pay

## 2024-10-31 NOTE — Progress Notes (Signed)
 Late Note Entry- Oct 31, 2024  Contacted [facility] [place] to be advised of pt's d/c from ED yesterday and that pt should resume on next appointed day.   Randine Mungo Dialysis Navigator (coverage) [phone]
